# Patient Record
Sex: Female | Born: 1955 | Race: White | Hispanic: No | Marital: Single | Smoking: Former smoker
Health system: Southern US, Community
[De-identification: ages and names within clinical notes are randomized; demographics above are authoritative.]

## PROBLEM LIST (undated history)

## (undated) DIAGNOSIS — A419 Sepsis, unspecified organism: Secondary | ICD-10-CM

## (undated) DIAGNOSIS — M109 Gout, unspecified: Secondary | ICD-10-CM

## (undated) DIAGNOSIS — J449 Chronic obstructive pulmonary disease, unspecified: Secondary | ICD-10-CM

## (undated) DIAGNOSIS — E7800 Pure hypercholesterolemia, unspecified: Secondary | ICD-10-CM

## (undated) HISTORY — PX: ABDOMINAL HYSTERECTOMY: SHX81

## (undated) HISTORY — PX: ROTATOR CUFF REPAIR: SHX139

---

## 1997-12-12 ENCOUNTER — Ambulatory Visit (HOSPITAL_COMMUNITY): Admission: RE | Admit: 1997-12-12 | Discharge: 1997-12-12 | Payer: Self-pay | Admitting: Cardiology

## 1998-11-06 ENCOUNTER — Encounter: Payer: Self-pay | Admitting: Cardiology

## 1998-11-06 ENCOUNTER — Ambulatory Visit (HOSPITAL_COMMUNITY): Admission: RE | Admit: 1998-11-06 | Discharge: 1998-11-06 | Payer: Self-pay | Admitting: Cardiology

## 1999-12-15 ENCOUNTER — Encounter: Payer: Self-pay | Admitting: Cardiology

## 1999-12-15 ENCOUNTER — Ambulatory Visit (HOSPITAL_COMMUNITY): Admission: RE | Admit: 1999-12-15 | Discharge: 1999-12-15 | Payer: Self-pay | Admitting: Cardiology

## 2000-02-24 ENCOUNTER — Ambulatory Visit (HOSPITAL_BASED_OUTPATIENT_CLINIC_OR_DEPARTMENT_OTHER): Admission: RE | Admit: 2000-02-24 | Discharge: 2000-02-24 | Payer: Self-pay | Admitting: Orthopedic Surgery

## 2000-05-26 ENCOUNTER — Other Ambulatory Visit: Admission: RE | Admit: 2000-05-26 | Discharge: 2000-05-26 | Payer: Self-pay | Admitting: Gynecology

## 2001-01-19 ENCOUNTER — Encounter: Payer: Self-pay | Admitting: Internal Medicine

## 2001-01-19 ENCOUNTER — Ambulatory Visit (HOSPITAL_COMMUNITY): Admission: RE | Admit: 2001-01-19 | Discharge: 2001-01-19 | Payer: Self-pay | Admitting: Internal Medicine

## 2001-05-05 ENCOUNTER — Other Ambulatory Visit: Admission: RE | Admit: 2001-05-05 | Discharge: 2001-05-05 | Payer: Self-pay | Admitting: Gynecology

## 2003-10-14 ENCOUNTER — Ambulatory Visit (HOSPITAL_COMMUNITY): Admission: RE | Admit: 2003-10-14 | Discharge: 2003-10-14 | Payer: Self-pay | Admitting: Internal Medicine

## 2003-10-14 ENCOUNTER — Encounter (INDEPENDENT_AMBULATORY_CARE_PROVIDER_SITE_OTHER): Payer: Self-pay | Admitting: Specialist

## 2003-10-27 ENCOUNTER — Encounter (INDEPENDENT_AMBULATORY_CARE_PROVIDER_SITE_OTHER): Payer: Self-pay | Admitting: *Deleted

## 2003-10-27 ENCOUNTER — Inpatient Hospital Stay (HOSPITAL_COMMUNITY): Admission: EM | Admit: 2003-10-27 | Discharge: 2003-10-28 | Payer: Self-pay | Admitting: Emergency Medicine

## 2003-12-28 ENCOUNTER — Inpatient Hospital Stay (HOSPITAL_COMMUNITY): Admission: EM | Admit: 2003-12-28 | Discharge: 2003-12-30 | Payer: Self-pay | Admitting: Emergency Medicine

## 2006-01-30 ENCOUNTER — Emergency Department (HOSPITAL_COMMUNITY): Admission: EM | Admit: 2006-01-30 | Discharge: 2006-01-30 | Payer: Self-pay | Admitting: Emergency Medicine

## 2006-02-01 ENCOUNTER — Emergency Department (HOSPITAL_COMMUNITY): Admission: EM | Admit: 2006-02-01 | Discharge: 2006-02-01 | Payer: Self-pay | Admitting: Emergency Medicine

## 2006-06-29 ENCOUNTER — Ambulatory Visit (HOSPITAL_COMMUNITY): Admission: RE | Admit: 2006-06-29 | Discharge: 2006-06-29 | Payer: Self-pay | Admitting: Internal Medicine

## 2006-07-14 ENCOUNTER — Inpatient Hospital Stay (HOSPITAL_COMMUNITY): Admission: EM | Admit: 2006-07-14 | Discharge: 2006-07-15 | Payer: Self-pay | Admitting: Emergency Medicine

## 2006-07-15 ENCOUNTER — Encounter (INDEPENDENT_AMBULATORY_CARE_PROVIDER_SITE_OTHER): Payer: Self-pay | Admitting: *Deleted

## 2006-07-15 ENCOUNTER — Ambulatory Visit (HOSPITAL_COMMUNITY): Admission: RE | Admit: 2006-07-15 | Discharge: 2006-07-15 | Payer: Self-pay | Admitting: *Deleted

## 2006-08-16 ENCOUNTER — Ambulatory Visit (HOSPITAL_COMMUNITY): Admission: RE | Admit: 2006-08-16 | Discharge: 2006-08-16 | Payer: Self-pay | Admitting: *Deleted

## 2006-10-17 ENCOUNTER — Ambulatory Visit (HOSPITAL_COMMUNITY): Admission: RE | Admit: 2006-10-17 | Discharge: 2006-10-17 | Payer: Self-pay | Admitting: Internal Medicine

## 2006-12-03 ENCOUNTER — Inpatient Hospital Stay (HOSPITAL_COMMUNITY): Admission: EM | Admit: 2006-12-03 | Discharge: 2006-12-05 | Payer: Self-pay | Admitting: Emergency Medicine

## 2006-12-05 ENCOUNTER — Encounter (INDEPENDENT_AMBULATORY_CARE_PROVIDER_SITE_OTHER): Payer: Self-pay | Admitting: Specialist

## 2006-12-08 ENCOUNTER — Ambulatory Visit: Payer: Self-pay | Admitting: Internal Medicine

## 2006-12-20 ENCOUNTER — Ambulatory Visit: Payer: Self-pay | Admitting: Internal Medicine

## 2007-01-30 ENCOUNTER — Emergency Department (HOSPITAL_COMMUNITY): Admission: EM | Admit: 2007-01-30 | Discharge: 2007-01-30 | Payer: Self-pay | Admitting: Emergency Medicine

## 2007-03-15 ENCOUNTER — Emergency Department (HOSPITAL_COMMUNITY): Admission: EM | Admit: 2007-03-15 | Discharge: 2007-03-15 | Payer: Self-pay | Admitting: Emergency Medicine

## 2007-05-16 ENCOUNTER — Emergency Department (HOSPITAL_COMMUNITY): Admission: EM | Admit: 2007-05-16 | Discharge: 2007-05-16 | Payer: Self-pay | Admitting: Emergency Medicine

## 2007-05-31 ENCOUNTER — Encounter: Payer: Self-pay | Admitting: Pulmonary Disease

## 2007-05-31 ENCOUNTER — Ambulatory Visit (HOSPITAL_COMMUNITY): Admission: RE | Admit: 2007-05-31 | Discharge: 2007-05-31 | Payer: Self-pay | Admitting: Internal Medicine

## 2007-07-01 ENCOUNTER — Emergency Department (HOSPITAL_COMMUNITY): Admission: EM | Admit: 2007-07-01 | Discharge: 2007-07-01 | Payer: Self-pay | Admitting: Emergency Medicine

## 2007-07-01 ENCOUNTER — Encounter: Payer: Self-pay | Admitting: Pulmonary Disease

## 2007-07-11 ENCOUNTER — Ambulatory Visit: Payer: Self-pay | Admitting: Pulmonary Disease

## 2007-07-11 DIAGNOSIS — R05 Cough: Secondary | ICD-10-CM

## 2007-07-11 DIAGNOSIS — J45909 Unspecified asthma, uncomplicated: Secondary | ICD-10-CM

## 2007-07-11 DIAGNOSIS — K219 Gastro-esophageal reflux disease without esophagitis: Secondary | ICD-10-CM | POA: Insufficient documentation

## 2007-07-11 DIAGNOSIS — R059 Cough, unspecified: Secondary | ICD-10-CM | POA: Insufficient documentation

## 2007-07-11 DIAGNOSIS — G4730 Sleep apnea, unspecified: Secondary | ICD-10-CM | POA: Insufficient documentation

## 2007-07-11 DIAGNOSIS — R0602 Shortness of breath: Secondary | ICD-10-CM | POA: Insufficient documentation

## 2007-07-12 ENCOUNTER — Telehealth (INDEPENDENT_AMBULATORY_CARE_PROVIDER_SITE_OTHER): Payer: Self-pay | Admitting: *Deleted

## 2007-07-13 DIAGNOSIS — I10 Essential (primary) hypertension: Secondary | ICD-10-CM

## 2007-07-14 ENCOUNTER — Telehealth: Payer: Self-pay | Admitting: Pulmonary Disease

## 2007-07-17 ENCOUNTER — Telehealth: Payer: Self-pay | Admitting: Pulmonary Disease

## 2007-08-01 ENCOUNTER — Ambulatory Visit: Payer: Self-pay | Admitting: Pulmonary Disease

## 2007-08-04 ENCOUNTER — Telehealth (INDEPENDENT_AMBULATORY_CARE_PROVIDER_SITE_OTHER): Payer: Self-pay | Admitting: *Deleted

## 2007-08-07 ENCOUNTER — Telehealth (INDEPENDENT_AMBULATORY_CARE_PROVIDER_SITE_OTHER): Payer: Self-pay | Admitting: *Deleted

## 2007-08-08 ENCOUNTER — Encounter: Payer: Self-pay | Admitting: Internal Medicine

## 2007-12-03 ENCOUNTER — Observation Stay (HOSPITAL_COMMUNITY): Admission: EM | Admit: 2007-12-03 | Discharge: 2007-12-05 | Payer: Self-pay | Admitting: Emergency Medicine

## 2008-07-12 ENCOUNTER — Ambulatory Visit (HOSPITAL_COMMUNITY): Admission: RE | Admit: 2008-07-12 | Discharge: 2008-07-12 | Payer: Self-pay | Admitting: Internal Medicine

## 2008-07-30 ENCOUNTER — Inpatient Hospital Stay (HOSPITAL_COMMUNITY): Admission: AD | Admit: 2008-07-30 | Discharge: 2008-08-01 | Payer: Self-pay | Admitting: Internal Medicine

## 2008-10-11 ENCOUNTER — Telehealth (INDEPENDENT_AMBULATORY_CARE_PROVIDER_SITE_OTHER): Payer: Self-pay | Admitting: *Deleted

## 2008-10-21 ENCOUNTER — Ambulatory Visit: Payer: Self-pay | Admitting: Pulmonary Disease

## 2008-10-21 ENCOUNTER — Ambulatory Visit (HOSPITAL_COMMUNITY): Payer: Self-pay | Admitting: Psychiatry

## 2008-10-21 DIAGNOSIS — J209 Acute bronchitis, unspecified: Secondary | ICD-10-CM

## 2009-02-16 ENCOUNTER — Emergency Department (HOSPITAL_COMMUNITY): Admission: EM | Admit: 2009-02-16 | Discharge: 2009-02-16 | Payer: Self-pay | Admitting: Emergency Medicine

## 2009-02-26 ENCOUNTER — Inpatient Hospital Stay (HOSPITAL_COMMUNITY): Admission: EM | Admit: 2009-02-26 | Discharge: 2009-02-28 | Payer: Self-pay | Admitting: Emergency Medicine

## 2009-02-27 ENCOUNTER — Ambulatory Visit: Payer: Self-pay | Admitting: Internal Medicine

## 2009-02-28 ENCOUNTER — Encounter: Payer: Self-pay | Admitting: Internal Medicine

## 2009-03-10 ENCOUNTER — Emergency Department (HOSPITAL_COMMUNITY): Admission: EM | Admit: 2009-03-10 | Discharge: 2009-03-10 | Payer: Self-pay | Admitting: Emergency Medicine

## 2009-03-14 ENCOUNTER — Emergency Department (HOSPITAL_COMMUNITY): Admission: EM | Admit: 2009-03-14 | Discharge: 2009-03-14 | Payer: Self-pay | Admitting: Emergency Medicine

## 2010-11-07 LAB — CBC
HCT: 50.5 % — ABNORMAL HIGH (ref 36.0–46.0)
Hemoglobin: 16.8 g/dL — ABNORMAL HIGH (ref 12.0–15.0)
Hemoglobin: 17.5 g/dL — ABNORMAL HIGH (ref 12.0–15.0)
MCHC: 33.3 g/dL (ref 30.0–36.0)
RBC: 4.91 MIL/uL (ref 3.87–5.11)
RBC: 5.15 MIL/uL — ABNORMAL HIGH (ref 3.87–5.11)
RDW: 13.1 % (ref 11.5–15.5)

## 2010-11-07 LAB — LIPASE, BLOOD: Lipase: 13 U/L (ref 11–59)

## 2010-11-07 LAB — DIFFERENTIAL
Basophils Absolute: 0 10*3/uL (ref 0.0–0.1)
Basophils Absolute: 0.1 10*3/uL (ref 0.0–0.1)
Basophils Relative: 1 % (ref 0–1)
Eosinophils Absolute: 0.3 10*3/uL (ref 0.0–0.7)
Lymphocytes Relative: 22 % (ref 12–46)
Monocytes Absolute: 0.7 10*3/uL (ref 0.1–1.0)
Monocytes Relative: 2 % — ABNORMAL LOW (ref 3–12)
Monocytes Relative: 4 % (ref 3–12)
Neutro Abs: 12 10*3/uL — ABNORMAL HIGH (ref 1.7–7.7)
Neutro Abs: 16.7 10*3/uL — ABNORMAL HIGH (ref 1.7–7.7)
Neutrophils Relative %: 77 % (ref 43–77)

## 2010-11-07 LAB — URINALYSIS, ROUTINE W REFLEX MICROSCOPIC
Glucose, UA: NEGATIVE mg/dL
Ketones, ur: NEGATIVE mg/dL
Nitrite: NEGATIVE
Protein, ur: NEGATIVE mg/dL
Urobilinogen, UA: 0.2 mg/dL (ref 0.0–1.0)
Urobilinogen, UA: 0.2 mg/dL (ref 0.0–1.0)
pH: 6 (ref 5.0–8.0)

## 2010-11-07 LAB — COMPREHENSIVE METABOLIC PANEL
Alkaline Phosphatase: 108 U/L (ref 39–117)
BUN: 5 mg/dL — ABNORMAL LOW (ref 6–23)
CO2: 26 mEq/L (ref 19–32)
GFR calc non Af Amer: >60 mL/min (ref >60)
Glucose, Bld: 99 mg/dL (ref 70–99)
Potassium: 3.3 mEq/L — ABNORMAL LOW (ref 3.5–5.1)
Total Bilirubin: 0.7 mg/dL (ref 0.3–1.2)
Total Protein: 6.9 g/dL (ref 6.0–8.3)

## 2010-11-07 LAB — BASIC METABOLIC PANEL
Calcium: 9.9 mg/dL (ref 8.4–10.5)
Creatinine, Ser: 0.86 mg/dL (ref 0.4–1.2)

## 2010-11-08 LAB — DIFFERENTIAL
Basophils Absolute: 0 10*3/uL (ref 0.0–0.1)
Basophils Absolute: 0.2 10*3/uL — ABNORMAL HIGH (ref 0.0–0.1)
Basophils Relative: 0 % (ref 0–1)
Basophils Relative: 1 % (ref 0–1)
Basophils Relative: 2 % — ABNORMAL HIGH (ref 0–1)
Eosinophils Absolute: 0.2 10*3/uL (ref 0.0–0.7)
Eosinophils Relative: 2 % (ref 0–5)
Eosinophils Relative: 2 % (ref 0–5)
Lymphocytes Relative: 23 % (ref 12–46)
Lymphocytes Relative: 25 % (ref 12–46)
Lymphs Abs: 3.6 10*3/uL (ref 0.7–4.0)
Lymphs Abs: 3.7 10*3/uL (ref 0.7–4.0)
Monocytes Absolute: 0.5 10*3/uL (ref 0.1–1.0)
Monocytes Absolute: 0.6 10*3/uL (ref 0.1–1.0)
Monocytes Relative: 4 % (ref 3–12)
Monocytes Relative: 5 % (ref 3–12)
Monocytes Relative: 5 % (ref 3–12)
Neutro Abs: 10.2 10*3/uL — ABNORMAL HIGH (ref 1.7–7.7)
Neutro Abs: 4.5 10*3/uL (ref 1.7–7.7)
Neutro Abs: 8.9 10*3/uL — ABNORMAL HIGH (ref 1.7–7.7)
Neutrophils Relative %: 48 % (ref 43–77)
Neutrophils Relative %: 61 % (ref 43–77)
Neutrophils Relative %: 70 % (ref 43–77)

## 2010-11-08 LAB — CBC
HCT: 44.7 % (ref 36.0–46.0)
HCT: 50.7 % — ABNORMAL HIGH (ref 36.0–46.0)
Hemoglobin: 15.4 g/dL — ABNORMAL HIGH (ref 12.0–15.0)
Hemoglobin: 17.1 g/dL — ABNORMAL HIGH (ref 12.0–15.0)
MCHC: 33.3 g/dL (ref 30.0–36.0)
MCHC: 33.8 g/dL (ref 30.0–36.0)
MCV: 104.2 fL — ABNORMAL HIGH (ref 78.0–100.0)
Platelets: 216 10*3/uL (ref 150–400)
Platelets: 218 10*3/uL (ref 150–400)
Platelets: 231 10*3/uL (ref 150–400)
RBC: 4.28 MIL/uL (ref 3.87–5.11)
RDW: 13 % (ref 11.5–15.5)
RDW: 13.3 % (ref 11.5–15.5)
RDW: 13.3 % (ref 11.5–15.5)
WBC: 11.5 10*3/uL — ABNORMAL HIGH (ref 4.0–10.5)
WBC: 13.1 10*3/uL — ABNORMAL HIGH (ref 4.0–10.5)

## 2010-11-08 LAB — COMPREHENSIVE METABOLIC PANEL
ALT: 26 U/L (ref 0–35)
AST: 21 U/L (ref 0–37)
AST: 22 U/L (ref 0–37)
Albumin: 3.3 g/dL — ABNORMAL LOW (ref 3.5–5.2)
Albumin: 3.4 g/dL — ABNORMAL LOW (ref 3.5–5.2)
Albumin: 3.8 g/dL (ref 3.5–5.2)
Alkaline Phosphatase: 101 U/L (ref 39–117)
Alkaline Phosphatase: 103 U/L (ref 39–117)
Alkaline Phosphatase: 104 U/L (ref 39–117)
BUN: 2 mg/dL — ABNORMAL LOW (ref 6–23)
BUN: 3 mg/dL — ABNORMAL LOW (ref 6–23)
BUN: 5 mg/dL — ABNORMAL LOW (ref 6–23)
Calcium: 9.5 mg/dL (ref 8.4–10.5)
Chloride: 106 mEq/L (ref 96–112)
Chloride: 110 mEq/L (ref 96–112)
Creatinine, Ser: 0.74 mg/dL (ref 0.4–1.2)
GFR calc Af Amer: >60 mL/min (ref >60)
GFR calc Af Amer: >60 mL/min (ref >60)
Glucose, Bld: 100 mg/dL — ABNORMAL HIGH (ref 70–99)
Glucose, Bld: 91 mg/dL (ref 70–99)
Potassium: 3 mEq/L — ABNORMAL LOW (ref 3.5–5.1)
Potassium: 3.1 mEq/L — ABNORMAL LOW (ref 3.5–5.1)
Sodium: 140 mEq/L (ref 135–145)
Total Bilirubin: 0.6 mg/dL (ref 0.3–1.2)
Total Bilirubin: 0.6 mg/dL (ref 0.3–1.2)
Total Protein: 6.3 g/dL (ref 6.0–8.3)
Total Protein: 6.9 g/dL (ref 6.0–8.3)
Total Protein: 7.3 g/dL (ref 6.0–8.3)

## 2010-11-08 LAB — POCT CARDIAC MARKERS
CKMB, poc: <1 ng/mL — ABNORMAL LOW (ref 1.0–8.0)
Myoglobin, poc: 38.1 ng/mL (ref 12–200)

## 2010-11-08 LAB — URINALYSIS, ROUTINE W REFLEX MICROSCOPIC
Bilirubin Urine: NEGATIVE
Glucose, UA: NEGATIVE mg/dL
Hgb urine dipstick: NEGATIVE
Ketones, ur: NEGATIVE mg/dL
Nitrite: NEGATIVE
Protein, ur: NEGATIVE mg/dL
Specific Gravity, Urine: 1.002 — ABNORMAL LOW (ref 1.005–1.030)
Urobilinogen, UA: 0.2 mg/dL (ref 0.0–1.0)
pH: 6 (ref 5.0–8.0)
pH: 6 (ref 5.0–8.0)

## 2010-11-08 LAB — PROTIME-INR: INR: 1 (ref 0.00–1.49)

## 2010-11-08 LAB — TSH: TSH: 0.059 u[IU]/mL — ABNORMAL LOW (ref 0.350–4.500)

## 2010-11-08 LAB — APTT: aPTT: 29 seconds (ref 24–37)

## 2010-11-08 LAB — POTASSIUM: Potassium: 3.4 mEq/L — ABNORMAL LOW (ref 3.5–5.1)

## 2010-11-08 LAB — VITAMIN D 25 HYDROXY (VIT D DEFICIENCY, FRACTURES): Vit D, 25-Hydroxy: 30 ng/mL (ref 30–89)

## 2010-12-15 NOTE — Discharge Summary (Signed)
NAMEPANSIE, GUGGISBERG                 ACCOUNT NO.:  [id number]   MEDICAL RECORD NO.:  [id number]          PATIENT TYPE:  INP   LOCATION:  1524                         FACILITY:  [facility]   PHYSICIAN:  Mark A. Perini, M.D.   DATE OF BIRTH:  [birth year]   DATE OF ADMISSION:  02/26/2009  DATE OF DISCHARGE:  02/28/2009                               DISCHARGE SUMMARY   DISCHARGE DIAGNOSES:  1. Right upper quadrant and right flank pain, the cause is unclear.  2. Fever and leukocytosis which responded to Zosyn therapy.  Again      this can not be explained although the patient may have had a form      of infectious colitis with some possible thickening of her      ascending colon noted on a CT scan upon admission.  3. Negative esophagogastroduodenoscopy this admission.  4. Chronic anxiety and depression.  5. Asthma.  6. History of coronary spasm.  7. Chronic obstructive pulmonary disease.  8. History of gastroesophageal reflux.  9. Hypothyroidism requiring adjustment of medication this admission.  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   PROCEDURES:  1. Gastroenterology consultation, esophagogastroduodenoscopy which was      normal.  2. Abdominal ultrasound on February 27, 2009 showing slight dilatation of      intrahepatic bile ducts but the size of the common bile duct and      common hepatic duct were normal and previous cholecystectomy was      noted.  3. Plain x-rays of the thoracic and lumbar spine showed some      degenerative changes but no acute findings.  4. CT scan of the abdomen and pelvis with IV contrast on February 26, 2009      showed an apparent thickening of the proximal colon which likely      reflected incomplete distention.  There were no secondary signs of      colitis.  Clinical correlation advised status post cholecystectomy.      No acute pelvic CT findings.   DISCHARGE MEDICATIONS:  1. Aspirin 81 mg daily.  2. Centrum multivitamin daily.  3. Elavil 100 mg each evening.  4.  Levothyroxine is reduced to 112 mcg one pill daily.  5. Symbicort 160/4.5 two puffs twice daily.  6. Xanax three times a day as before.  7. Wellbutrin XL 150 mg one pill each day.  8. B12 supplement daily.  9. One thousand units of over-the-counter vitamin D daily long-term.  10.Phenergan 12.5 mg every 8 hours as needed for nausea.  11.Vicodin 5/500 one to two every 6 hours as needed for pain.  12.Gabapentin 100 mg three times a day as needed.  13.Augmentin 875 mg twice a day with food for 5 days.  14.Diflucan to be dosed at the end of her Augmentin course.  15.She is to take over-the-counter Align one daily for 2 weeks.   HISTORY OF PRESENT ILLNESS:  Karie is a 55 year old female who presented  with a second trip to the emergency room for similar complaints which  included right sided abdominal pain and flank pain and fever.  She  initially was felt to have a virus but her symptoms did not improve and  she presented again to the emergency room and CT scan showed equivocal  findings of possible thickening of the ascending colon.  She did have  fever and a high white blood count.  She was admitted for further care.   HOSPITAL COURSE:  Lyndee was admitted to a regular bed.  She remained  stable from a hemodynamic standpoint during her stay and she remained  stable from a pulmonary standpoint during her stay.  She was treated  empirically with Zosyn with improvement in temperature curve and white  blood count, however she continued to report right upper quadrant and  right abdominal pain and it almost had a neuropathic component to it.  Therefore, plain x-rays of the thoracic and lumbar spine were performed  with the results as noted above.  Abdominal ultrasound was performed  with the results as noted above.  She did seem to respond somewhat to  gabapentin although there was no true evidence of any kind of shingles  rash noted.  Her thyroid medicine was adjusted and Gastroenterology was   consulted.  By February 28, 2009 her vital signs were stable.  Her labs had  improved and her endoscopy was negative.  Therefore she was discharged  home to complete a course of oral antibiotics and she was sent home with  pain medication as well.   DISCHARGE PHYSICAL EXAM:  Temperature 97.9, afebrile, pulse 72, blood  pressure 107/72, respiratory rate 16, 94% saturation on room air.  She  is in no acute distress.  She was alert and oriented.  LUNGS:  Clear to auscultation bilaterally with no wheezes, rales or  rhonchi.  HEART:  Regular rate and rhythm with no murmur, rub or gallop.  ABDOMEN:  Soft, nontender, nondistended.  She reported some tenderness  but the abdomen was soft with no rebound or guarding.  There was no  edema.  There was no rash.   NOTABLE DISCHARGE LABORATORY DATA:  A 25-hydroxy vitamin D level was 30,  sodium 140, potassium 3.8, chloride 104, CO2 - 27, BUN 2, creatinine  0.81, glucose 100, LFTs normal with the exception of a slightly low  albumin of 3.4, white count 9.4, hemoglobin 15.4, MCV elevated at 105,  platelet count 218,000, there were 48% segs, 39% lymphocytes, 5%  monocytes and 7% eosinophils.  TSH was suppressed at 0.059 on February 26, 2009, lipase was normal at 15.  Urine pregnancy test was negative and  urinalysis was negative upon admission.   DISCHARGE INSTRUCTIONS:  Shannon is to follow a low-salt, low-fat diet.  She is to call if she has any recurrent problems.  She is to follow her  medications as noted above.  She is to call for a followup visit in one  week.      Mark A. Perini, M.D.  Electronically Signed     MAP/MEDQ  D:  03/07/2009  T:  03/07/2009  Job:  [id number]

## 2010-12-15 NOTE — H&P (Signed)
NAMEVARNIKA, [name] NO.:  [id number]   MEDICAL RECORD NO.:  [id number]          PATIENT TYPE:  EMS   LOCATION:  ED                           FACILITY:  [facility]   PHYSICIAN:  Mark A. Perini, M.D.   DATE OF BIRTH:  1956/07/07   DATE OF ADMISSION:  02/26/2009  DATE OF DISCHARGE:                              HISTORY & PHYSICAL   CHIEF COMPLAINT:  Abdominal pain.   HISTORY PRESENT ILLNESS:  [name] is a 55 year old Caucasian female well-  known to me.  Her past medical history is as listed below.  She has  pending disability.  She has chronic depression and has remained  relatively depressed the last several months.  She had an admission for  delusional disorder in late December, 2009 and she did take Seroquel for  some time but this has been stopped for the last 3-4 months.  She states  that she is not having any  delusional thoughts currently.  She  presented to the emergency room about 10 days ago with complaint of  abdominal pain.  She was felt to possibly have a virus.  However, her  symptoms did not improve and she presented again to the emergency room  today with right-sided abdominal pain and tenderness.  In the emergency  room, she is noted to have a high white count.  CT scan is somewhat  equivocal showing some possible thickening of the ascending colon wall  but this may just be a normal finding.  There is no other source of  infection seen.  However, she has had significant nausea and vomiting  and has been unable to keep down p.o. intake well.  She had also had  some subjective temperatures and cold feeling and chills and her  temperature was 100 today with the paramedics.  Given all these  findings, she will be admitted for further care.   PAST MEDICAL HISTORY:  1. Hypothyroidism.  2. Hysterectomy without oophorectomy, 1985.  3. Tonsillectomy, adenoidectomy.  4. D and C in 1984.  5. Chronic depression, anxiety.  6. G0 parity status.  7. Right rotator  cuff surgery, 2001.  8. Cholecystectomy April 2005.  9. Colon polyps noted, 2005.  10.She was hospitalized, 2005, for acute asthmatic bronchitis.  11.She has chronic asthma as well.  12.She had left forehead shingles in 2007 and has had post herpetic      pain.  13.She does have a history of somewhat of a chronic elevated white      count since 2005.  Last white blood count in our office was 10.6 in      December 2009.  14.She has a history of hyperlipidemia.  15.Gastroesophageal reflux.  16.Dependent personality disorder.  Again she was admitted in December      2009 for delusional symptoms.  17.She had a negative cardiac workup late in 2007.  However, she might      have had some evidence of coronary spasm seen on a heart cath and      she had been treated with Diovan and nitroglycerin agents for some  time.   ALLERGIES:  GEMFIBROZIL CAUSES SICKNESS ON THE STOMACH.  TYLOX  DOES NOT  WORK.  BACTRIM CAUSES NAUSEA, VOMITING.  NITROGLYCERINE PATCH CAUSED A  LOCAL REACTION.  GRAPEFRUIT JUICE GAVE HER A BREATHING PROBLEM  AND  PRAVACHOL MADE HER DIZZY AND ANGRY.   CURRENT MEDICATIONS:  1. Aspirin 81 mg daily.  2. Centrum 1 daily.  3. Elavil 100 mg each evening.  4. Symbicort 2 puffs twice daily 160/4.5 dose.  5. Levothyroxine 150 mcg daily except none 1 day a week.  6. Vicodin rarely.  7. Vitamin B12 over-the-counter 1 daily.  8. Xanax 0.5 mg 3x a day.  9. Nexium 40 mg 2x a day.  10.DuoNeb 3x a day.  She had been off isosorbide and Diovan and she has been off Seroquel for  the last 3-4 months.   SOCIAL HISTORY:  She is divorced in 84.  She has a boyfriend named  [name] who is with her now.  She has no children.  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  She smokes.  She has had a 30 pack-year  history of smoking but she had quit in 2006 and has mostly maintainED  her abstinence from smoking.   No alcohol, no drug use.  Father died at  age 28 of a motor vehicle accident.  Mother died at age 56 of brain  tumors.  She is an only child.   REVIEW OF SYSTEMS:  As per the history of present illness.  She denies  any blood from above or below.  She has been moving her bowels.  She has  been passing urine relatively well.   PHYSICAL EXAM:  Temperature 99.1, blood pressure 131/78, pulse 103,  respiratory 18, 94% saturation on room air.  She is alert and oriented.  She is in no acute distress.  There is no pallor, no icterus, no JVD.  LUNGS:  Reveals some scant wheezing bilaterally but overall, fairly good  air movement bilaterally.  No rhonchi or rales.  HEART:  Regular rate and rhythm with no murmur, rub or gallop.  ABDOMEN:  Reveals decreased bowel sounds.  The abdomen is soft with no  rebound or guarding.  She reports some tenderness in the left side of  the abdomen, in left upper quadrant and left side, and somewhat in the  right upper quadrant as well.  There is no peripheral edema.  She moves  extremities x4 and responds to questions appropriately and follows  commands well.   LABORATORY DATA:  Lipase is normal at 15.  Sodium is 141, potassium was  low at 3.0, chloride is 107, CO2 27, BUN 3, creatinine 0.74, glucose 91.  GFR greater than 60, total bili 0.4, alk phos 103, AST 22, ALT 16, total  protein 6.9, albumin 3.7, calcium 9.5.  White count 14.6, hemoglobin  17.1, platelet count 243,000.  There are 70% segs, 23% lymphocytes, 4%  monocytes, 2% eosinophils, 2% basophils.  Urinalysis is negative.  Urine  HCG test is negative.   CT scan of the abdomen and pelvis performed with IV contrast:  Shows  apparent thickening of the proximal colon which could reflect incomplete  distention.  There are no secondary signs of colitis.  Clinical  correlation is advised. She is status post cholecystectomy.  There are  no acute findings in the pelvis or the remainder of the abdomen.  ASSESSMENT/PLAN:  Abdominal pain, low grade temperatures and equivocal  findings on CT scan.  [name] may have a form of colitis.  She may have  some other GI process such as peptic ulcer disease.  We will admit her.  We will give her IV fluids.  We will replace her potassium.  We will  treat with antiemetics.  I will cover her with  intravenous Zosyn.  We will get an abdominal ultrasound and a  gastroenterology consultation.  Hopefully her symptoms will improve with  these measures.  We will continue her other home medications and check  her TSH as well.      Mark A. Perini, M.D.  Electronically Signed     MAP/MEDQ  D:  02/26/2009  T:  02/26/2009  Job:  [id number]

## 2010-12-15 NOTE — Discharge Summary (Signed)
[name], MALLIS NO.:  [id number]   MEDICAL RECORD NO.:  [id number]          PATIENT TYPE:  INP   LOCATION:  2021                         FACILITY:  [facility]   PHYSICIAN:  Mark A. Perini, M.D.   DATE OF BIRTH:  [birth year]   DATE OF ADMISSION:  12/03/2006  DATE OF DISCHARGE:  12/05/2006                               DISCHARGE SUMMARY   .   DISCHARGE DIAGNOSES:  1. Anxiety depression.  2. Dependent personality disorder.  3. Chronic asthma.  4. Chronic obstructive pulmonary disease.  5. Hypothyroidism.  6. Atypical chest pain.  7. Dyspnea and dyspnea on exertion.  8. Hypothyroidism.  9. Presumed gastroesophageal reflux.  10.B12 deficiency.  11.Possible coronary spasm which had been treated medically.  12.Normal endoscopy this admission.   PROCEDURES:  Upper endoscopy and gastroenterology consultation.   DISCHARGE MEDICATIONS:  1. Diovan 80 mg daily.  2. Isosorbide 30 mg daily.  3. Xanax 0.5 mg q.i.d.  4. 81 mg aspirin daily.  5. Wellbutrin XL 150 mg daily.  6. Levothyroxine 200 mg daily except 1/2 pill each Sunday and      Wednesday.  7. Advair 250/50 1 puff twice a day.  8. DuoNeb breathing treatments three times a day.  9. Elavil 100 mg at bedtime.  10.Gas-X as needed.  11.Phenergan as needed.  12.Nexium 40 mg daily.  13.Vicodin 1-2 tablets day as needed.  14.Lyrica 50 mg b.i.d. to t.i.d. as needed for postherpetic neuralgia      symptoms of the face.   HISTORY OF PRESENT ILLNESS:  [name] is a 55 year old female who calls our  office two to three times every single day with a history of depression,  dependent personality, chronic asthma, COPD, hypothyroidism and apical  chest pain.  She had a cardiac catheterization in January revealing some  catheter induced vasospasm of the proximal RCA but otherwise normal  coronaries and a normal ejection fraction and borderline pulmonary  hypertension.  She was placed on Diovan and nitrates.  She  presented  with several week history of increased epigastric bloating, pain, nausea  and vomiting despite one to three Nexium each day.  She was scheduled to  see Dr. Marina Goodell as an outpatient, however, on the day of admission she  developed 10/10 chest pain 10 minutes after eating.  She took three  sublingual nitroglycerin with no relief.  EMS was called and she was  brought to the emergency room.  In the emergency room it was felt that  this was not a cardiac etiology.  However, she was admitted for further  care.   HOSPITAL COURSE:  [name] was admitted.  There was no evidence of acute  coronary syndrome based on EKG and cardiac enzymes.  GI was consulted  and she did undergo upper endoscopy which was essentially normal.  On  Dec 05, 2006 she was deemed stable to be discharged home.   DISCHARGE PHYSICAL EXAM:  Temperature 98.9, afebrile, pulse 80-120,  normal sinus rhythm, blood pressure 106/69, 95% saturation on room air.  She is in no acute distress.  LUNGS:  Clear  to auscultation bilaterally with no wheezes, rales or  rhonchi.  HEART:  Regular rate and rhythm with no murmur, rub or gallop.  ABDOMEN:  Obese, soft, nontender.  There was no edema.   NOTABLE LABORATORY DATA:  On 12/04/2006 white count was 9.6, hemoglobin  13.5, platelet count 230,000.  Coagulation studies were normal.  On  12/04/2006 sodium was 133, potassium 3.8, chloride 98, CO2 25, glucose  88, BUN 11, creatinine 0.89. CK enzyme was normal and troponin-I was  normal x2 sets, actually x3 sets.   DISCHARGE INSTRUCTIONS:  [name] is to follow a low-salt diet and is to  watch foods that cause reflux.  She is to increase her activity as  tolerated.  She is to follow up with Dr. Waynard Edwards at a previously  scheduled visit.  She is to call if there are any recurrent problems.           ______________________________  Redge Gainer Waynard Edwards, M.D.     MAP/MEDQ  D:  01/19/2007  T:  01/19/2007  Job:  [id number]

## 2010-12-15 NOTE — Assessment & Plan Note (Signed)
HEALTHCARE                         GASTROENTEROLOGY OFFICE NOTE   [name], [name]                        MRN:          [id number]  DATE:12/20/2006                            DOB:          [birth year]    REASON FOR CONSULTATION:  Multiple chronic complaints led by chest pain.  Exclude GI etiology.   HISTORY:  This is a 55 year old white female with a history of anxiety  and depressive disorder, chronic asthma, COPD from prior tobacco abuse,  hypothyroidism, history of atypical chest pain, obesity, and  gastroesophageal reflux disease.  The patient was evaluated in this  office initially in February of 2005 for rectal bleeding.  She was felt  to have irritable bowel syndrome.  She underwent complete colonoscopy,  October 15, 2003.  Examination was normal except for small internal  hemorrhoids.  A diminutive colon polyp was removed and found to be  hyperplastic.  Followup in 10 years recommended.  More recently, the  patient reports a 72-month history of problems with chest pain.  Also,  problems with epigastric fullness, sensation of shortness of breath,  nausea, difficulty eating, and occasional vomiting.  For these symptoms,  she was hospitalized Dec 03, 2006.  She underwent a formal cardiac  evaluation including cardiac consultation, CT scan of the chest, and  gastroenterology consultation which included upper endoscopy and  abdominal ultrasound.  Despite these evaluations and investigations, no  etiology for her symptoms was elucidated.  Despite b.i.d. proton pump  inhibitor therapy, she denies significant change in symptoms. Upon  questioning, there are no classic reflux symptoms while on medication.  Her upper endoscopy was entirely normal.  Biopsies of the distal  esophagus revealed only mild inflammation.  Throughout the course of the  interview today, she reiterates multiple vague complaints.She prepared a  letter or statement, which she read.   She states that she lives alone  and that there is no one to take care of her and no one to give her  oxygen...  She is worried about her heart and her breathing...  She asks  why she has difficulty eating...  She has had no weight loss.  As a  matter of fact, weight gain.  She denies dysphagia, melena, or jaundice.  She is status post cholecystectomy.   ALLERGIES:  NO KNOWN DRUG ALLERGIES.   MEDICATIONS:  1. Elavil 100 mg at night.  2. Nexium 40 mg b.i.d.  3. Xanax 0.5 mg q.i.d.  4. Levoxyl 200 mcg daily.  5. Vitamin C.  6. L-lysine.  7. Multivitamin.  8. Wellbutrin XL 150 mg daily.  9. Diovan 80 mg daily.  10.Isorbid 15 mg daily.  11.Aspirin 81 mg daily.  12.Advair discus b.i.d.  13.Unspecified nebulizer t.i.d.  14.Promethazine p.r.n.  15.Gas-X p.r.n.  16.Nitroglycerin sublingual p.r.n.  17.MiraLax p.r.n.   PAST MEDICAL HISTORY:  As above.   PAST SURGICAL HISTORY:  1. Rotator cuff surgery.  2. Hysterectomy.  3. Cholecystectomy.   FAMILY HISTORY:  No family history of gastrointestinal malignancy.   SOCIAL HISTORY:  The patient is single, lives alone, has a 12th grade  education, does not smoke currently.  Denies alcohol use.   REVIEW OF SYSTEMS:  Almost entirely positive.  See diagnostic evaluation  form.   PHYSICAL EXAMINATION:  Apathetic, somewhat depressed-appearing female in  no acute distress.  Blood pressure is 106/66, initial heart rate is 122 and regular, repeat  heart rate is 92 and regular.  Her weight is 231 pounds, she is 5 feet 5  inches in height.  HEENT:  Sclerae anicteric, conjunctivae are pink, oral mucosa is intact.  The lungs are clear to auscultation and percussion.  The heart is regular.  The abdomen is obese and soft with complaints of tenderness with minimal  palpation.  Good bowel sounds heard.  No masses felt, no hernia.  EXTREMITIES:  Without edema.   ASSESSMENT:  The patient presents with  chronic complaints of chest pain, dyspnea,  as  well as vague abdominal complaints.  Prior gastrointestinal workup  including colonoscopy, upper endoscopy, and abdominal ultrasound as  described above.  No meaningful response to high-dose proton pump  inhibitor therapy. Also, she has had negative cardiopulmonary  evaluations under the direction of her other physicians and has been  reassured by them that her problems are of neither a primary cardiac or  pulmonary disorder.  Given their negative evaluations, and the fact that  I do not think in any way that she suffers from a primary  gastrointestinal disorder that explains the bulk of her complaints, I  truly suspect that her problems are psychogenic in nature.  Whether this  is something as simple as anxiety with depression or something more  complicated, I am not certain. I am told that she has had some form of  psychiatric evaluation. I  discussed my impressions with the patient as  well as Dr. Waynard Edwards.  I do not see any merit in additional or  redundant gastrointestinal investigations or alteration in her GI  medications at this time.  As such, she will return to the care of Dr.  Waynard Edwards for further management.     Wilhemina Bonito. Marina Goodell, MD  Electronically Signed    JNP/MedQ  DD: 12/20/2006  DT: 12/20/2006  Job #: [id number]   cc:   Loraine Leriche A. Perini, M.D.  Elmore Guise., M.D.

## 2010-12-15 NOTE — H&P (Signed)
NAMESHENIA, ALAN                 ACCOUNT NO.:  [id number]   MEDICAL RECORD NO.:  [id number]          PATIENT TYPE:  INP   LOCATION:  1505                         FACILITY:  [facility]   PHYSICIAN:  Mark A. Perini, M.D.   DATE OF BIRTH:  11-23-1955   DATE OF ADMISSION:  07/30/2008  DATE OF DISCHARGE:                              HISTORY & PHYSICAL   CHIEF COMPLAINT:  Anxiety, slurred speech, delusional thoughts and  visual delusions.   HISTORY OF PRESENT ILLNESS:  Timothy is a 55 year old Caucasian female with  a past medical history as listed below.  She has been having some  increased anxiety issues.  Earlier in the month, she was seen on  July 11, 2008, at which time she felt convinced that she had, had a  stroke.  Her speech was somewhat slow at the time.  She seemed that she  had maybe been taking too much pain medicine based on her clinical  presentation.  She did have an MRI of the brain on July 12, 2008,  which showed no acute or subacute stroke.  She had some vague small  white matter lesions.  She was advised to decrease her hydrocodone to no  more than 4 per day for her chronic back and leg pain.  However, she has  continued to have problems and her boyfriend called today with concerns  that she had been staggering around with slurred speech and confusion.  She did present to the office today at which time she reported that she  has not been sleeping for the last 2 weeks.  She has been seeing some  things that are not there.  She says she sees little figures with big  heads.  She says she has been eating and drinking okay.  She denies any  fever.  She has had some weight loss.  She has had also some auditory  delusions, hearing voices like people talking and she will holler and  yell out for people to leave her house when there is not anybody there.  She denies any recreational drug use.  She denies any alcohol use.  She  says she has been taking 2 Vicodin twice a day.   She also has been on  her usual Xanax and she has been taking Reglan for reflux and stomach  issues.  In the office, she had slurred speech and had difficulty  walking without stumbling.  She is therefore admitted for further  evaluation and treatment.   PAST MEDICAL HISTORY:  1. Hypothyroidism.  2. Hysterectomy without oophorectomy in 1985.  3. Depression and anxiety which is chronic and severe.  4. G0 parity status.  5. Right shoulder surgery to her rotator cuff in 2001.  6. Cholecystectomy in April 2005.  7. Colon polyps noted in 2005.  8. She was hospitalized in 2005 for acute asthmatic bronchitis.  She      does have chronic asthma.  9. She had left forehead shingles with postherpetic symptoms in      February 2007.  10.She has acid reflux.  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  12.She had a negative cardiac workup in 2007, except she may have had      some coronary spasm seen on a catheterization and was therefore      treated with some isosorbide after this time.  13.She has had a chronically elevated white count since 2005.  14.Hyperlipidemia.   ALLERGIES:  1. GEMFIBROZIL MADE SICK ON HER STOMACH.  2. TYLOX DOES NOT WORK.  3. MILK OF MAGNESIA MADE HER SICK.  4. BACTRIM CAUSED NAUSEA AND VOMITING.  5. NITROGLYCERIN PATCH CAUSED LOCAL IRRITATION.  6. GRAPE FRUIT JUICE GIVES HER A BREATHING DIFFICULTY.  7. PRAVACHOL MADE HER DIZZY AND ANGRY.   CURRENT MEDICATIONS:  1. Xanax 0.5 mg three times a day.  2. Levoxyl 150 mg daily 6 days a week with no pill on the seventh day.  3. Elavil 100 mg each evening.  4. Phenergan as needed, although she says she has not had this in      several weeks.  5. Multivitamin daily.  6. Nexium 40 mg twice a day.  7. DuoNeb nebulized three times a day.  8. Wellbutrin XL 150 mg daily.  9. B12 500 mcg daily.  10.Diovan one-half of an 80 mg pill daily.  11.Aspirin 81 mg daily.  12.Isosorbide one-half of a 30 mg pill daily.   13.Vicodin 5/500 one-to-two pills every 6 hours as needed.  14.Symbicort 160/4.5 two puffs twice a day.  She has actually been      taking 1 puff twice a day recently.  15.  Reglan 5 mg, she says she      has been doing this four times a day recently.   SOCIAL HISTORY:  She is divorced since 75.  Her boyfriend, Chuck stays  with her.  The patient states that her father-in-law may have severe  alcoholism and that there is a lot of disruption at the household.  She  has no children.  She has been unemployed and she is disabled, although  she has been unable to get disability awarded to her which has been  somewhat frustrating to me because Sue has clearly been disabled for  the last several years.  She does smoke one pack of cigarettes a day;  however, she states she quit in September 2006.  She states that she  does not smoke, although she usually does smell of cigarettes.  She  denies alcohol or other drug use.  Father died at age 76 of diabetes in  a motor vehicle accident.  Mother died at age 66 of brain tumors.  She  is an only child.   REVIEW OF SYSTEMS:  As per the history present illness.   PHYSICAL EXAMINATION:  VITAL SIGNS:  Temperature 98.2, blood pressure  122/70, pulse 110.  Saturation 95% on room air.  GENERAL:  She laughs at  times somewhat inappropriately during the interview.  She does have  slurred speech and does have difficulty steadying herself, but she is  able to ambulate, but she does stumble from side-to-side at times.  She  has no focal weakness.  LUNGS:  Clear to auscultation bilaterally with no wheezes, rales or  rhonchi.  HEART:  Regular rate and rhythm with no murmur, rub or gallop.  EXTREMITIES:  There is no peripheral edema.  NEURO:  She is able to converse somewhat appropriately and normal at  other times in the interview.   LABORATORY DATA:  Pending.   ASSESSMENT/PLAN:  A 55 year old female with slurred  speech, increased  anxiety, insomnia and  probable auditory and visual delusional thoughts  and experiences.  We will admit her to [facility].  We will  ask for a psychiatry consultation.  I will stop her Vicodin and Reglan,  but I will continue her Xanax at her usual dose.  We will add Seroquel 25 mg each evening.  I will check a urine and serum  drug screen, as well as serum alcohol level and other admission lab  work.  She may need more intensive psychiatric care, but at this time  she does not present with any suicidal or homicidal thoughts.  She is a  full code status.      Mark A. Perini, M.D.  Electronically Signed     MAP/MEDQ  D:  07/30/2008  T:  07/30/2008  Job:  [id number]

## 2010-12-15 NOTE — H&P (Signed)
[name], DOWNARD NO.:  [id number]   MEDICAL RECORD NO.:  [id number]          PATIENT TYPE:  EMS   LOCATION:  ED                           FACILITY:  [facility]   PHYSICIAN:  Gaspar Garbe, M.D.DATE OF BIRTH:  1955/09/26   DATE OF ADMISSION:  12/03/2007  DATE OF DISCHARGE:                              HISTORY & PHYSICAL   CHIEF COMPLAINT:  Persistent cough, low blood pressure.   HISTORY OF PRESENT ILLNESS:  The patient is a 55 year old white female  with a prior history of significant tobacco abuse and subsequent COPD  with history of chronic chest pain, anxiety and depression, who came to  the [facility] emergency room today because of persistent cough and  headache.  The cough has been taking place over the past 4 days with  headache just over the past 2.  She has indicated that she may or may  not have elevated temperatures.  She said she had taken her temperature  before and found it to be 100 to 102, but has mostly just felt warm.  She has not had any nausea, vomiting or muscle weakness.  She was given  Rocephin and azithromycin by the ER physician and a liter of fluid.  After 2 liters, her blood pressure was still low and she said she felt  slightly dizzy, therefore warranting an observation admission.  I was  asked to come and admit to the patient.   The patient is attended by family who all smell of cigarette smoke and  indicate that they smoke around her.  She indicates strict compliance  with her Symbicort and albuterol, but simply has not been able to feel  better over the weekend.  She denies any other sick relatives or any  travel outside the country or to fly endemic areas.   ALLERGIES:  NO KNOWN DRUG ALLERGIES.   MEDICATIONS:  1. Vicodin 2-3 tablets per day, mostly for back pain.  2. Multivitamin.  3. B12 500 mcg a day.  4. L-lysine.  5. Xanax 0.5 mg b.i.d.  6. Phenergan p.r.n.  7. Isosorbide 15 mg p.o. daily.  8. Reglan t.i.d.  9.  Nexium 40 mg b.i.d.  10.Diovan 80 mg p.o. daily.  11.Aspirin 81 mg daily.  12.Synthroid 150 mcg p.o. daily.  13.Wellbutrin 150 mg p.o. daily.  14.Elavil 100 mg p.o. nightly.  15.Symbicort 160 two puffs twice daily.  16.Albuterol nebulizer at home.   PAST MEDICAL HISTORY:  1. COPD.  The patient was last hospitalized in November for      exacerbation.  2. Chronic asthma.  3. Anxiety and depression.  4. Hypothyroidism.  5. Atypical chest pain with subsequent catheterization which was      negative in January 2008.  6. Gastroesophageal reflux disease and poor motility.   SOCIAL HISTORY:  The patient lives in [place] with her boyfriend, who  is an active smoker.  She is divorced.  She could not indicate how long  she has quit smoking, but indicates that Dr. Waynard Edwards would know.  She has  at least a 20 pack year  history prior to this.  She does not drink  alcohol.  She is currently disabled.   FAMILY HISTORY:  Positive for congestive heart failure and coronary  artery disease.   REVIEW OF SYSTEMS:  The patient has a headache, but denies any sore  throat or neck pain.  Denies any chills or fever at present, but had  them yesterday.  Denies any nausea or vomiting.  She indicates shortness  of breath, but no real dyspnea on exertion.  Her cough and wheezing that  has not gone away.  Review of systems is otherwise negative on a 10  point scale.   ADVANCED DIRECTIVES:  The patient is full code.   PHYSICAL EXAMINATION:  VITAL SIGNS:  Temperature 97.4, pulse 90,  respiratory rate 18, blood pressure 102/60.  GENERAL:  No acute distress.  HEENT:  Normocephalic, atraumatic.  PERRLA.  EOMI.  ENT within normal  limits.  NECK:  Supple, no lymphadenopathy, JVD or bruit.  HEART:  Regular rate and rhythm.  No murmur, rub or gallop.  LUNGS:  The patient has bilateral wheezes on expiration.  No crackles  are heard.  No other adventitious sounds.  No egophony.  ABDOMEN:  Soft, nontender.   Normoactive bowel sounds.  No  hepatosplenomegaly.  EXTREMITIES:  No clubbing, cyanosis or edema.  NEUROLOGIC:  The patient is oriented to person, place and time.  No  neurologic deficits noted.   Chest x-ray is negative for pneumonia or active disease.   LABORATORY DATA:  White count 11.6, hemoglobin 14.4, hematocrit 42.2,  platelets 244.  BUN and creatinine are 5 and 0.8 respectively.  Electrolytes are otherwise within normal limits.  Set of cardiac enzymes  negative with a troponin less than 0.05.  BNP is less than 30.  Her MCV  on her CBC is elevated to 102.   ASSESSMENT/PLAN:  1. Chronic obstructive pulmonary disease exacerbation, possibly      bronchitis.  The patient has received Rocephin and azithromycin in      the emergency room and will continue this.  Will also be giving her      prednisone 40 mg a day and increasing her nebulizer timing to every      4 hours as needed.  She remain on her Symbicort.  Family members      were told not smoke near her as this can worsen her symptoms even      though she is not actively smoking.  They seem to be aware of this      and willing to comply.  She will have a repeat chest x-ray in the      morning to make sure that she does not fluff out a pneumonia with      the added hydration.  2. Hypotension.  Will discontinue her Diovan and hold her isosorbide      until the morning.  If her blood pressure is greater than 100, we      may continue this.  Most likely dehydration but her BMET does not      match this.  She can't be sent home with a systolic blood pressure      in the 70s to 90s.  This may also be part of her ongoing illness.      She clearly warrants observation as she cannot be sent home from      the emergency room.  3. Anxiety and depression.  No change to meds.  4. Gastroesophageal reflux disease.  No changes to medications.  5. Hypertension held as above.  6. Headache.  She will use her Vicodin while in-house on a q.6       schedule.      Gaspar Garbe, M.D.  Electronically Signed     RWT/MEDQ  D:  12/03/2007  T:  12/03/2007  Job:  [id number]   cc:   Loraine Leriche A. Perini, M.D.  Fax: [phone]

## 2010-12-15 NOTE — Discharge Summary (Signed)
NAMECATHERYN, [name]                 ACCOUNT NO.:  [id number]   MEDICAL RECORD NO.:  [id number]          PATIENT TYPE:  INP   LOCATION:  1505                         FACILITY:  [facility]   PHYSICIAN:  Mark A. Perini, M.D.   DATE OF BIRTH:  10/01/1955   DATE OF ADMISSION:  07/30/2008  DATE OF DISCHARGE:  08/01/2008                               DISCHARGE SUMMARY   DISCHARGE DIAGNOSES:  1. Delirium.  2. Anxiety.  3. Depression.  4. Possible medication side effects to Reglan and Elavil and possibly      Wellbutrin.  5. Hypothyroidism.  6. Dependent personality disorder.  7. Acid reflux.  8. Hyperlipidemia.  9. Possible history of coronary spasm in the past.  10.Relative hypotension this admission requiring discontinuation of      her Diovan and isosorbide.   PROCEDURES:  Psychiatry consultation.   DISCHARGE MEDICATIONS:  1. She is to resume Xanax 0.5 mg 3 times a day.  2. She is to resume Levoxyl 150 mcg 1 daily except no pill every      Sunday.  3. Her Elavil dose is now reduced to 25 mg every evening.  4. She is to try to avoid promethazine.  5. She is to resume her multivitamin and her Nexium 40 mg twice a day      and her DuoNeb nebulized 3 times daily.  6. She is to stop Wellbutrin and stop Diovan and stop isosorbide and      she is to stop all use of Reglan.  7. She is to resume of B12 at 500 mcg daily.  8. Aspirin 81 mg daily.  9. She is to use Tylenol 500 mg 1 to 2 pills every 8 hours as needed      for pain.  10.She is to try to avoid Vicodin although she does have significant      neck pain and I did give her a prescription for 1 Vicodin every 8      hours as needed for pain, #60, with 1 refill.  She may use this      once or twice a day if needed for severe pain.  11.She is to resume Symbicort 160/4.5 at 2 puffs twice a day.  12.She may resume her fish oil supplement.  13.She is on a new medication Seroquel 25 mg each evening and a      prescription is given for  this.   HISTORY OF PRESENT ILLNESS:  [name] is a 55 year old female with chronic  anxiety and depression issues.  She presented to the office with  confusion, some delirium, with some auditory and visual delusions, as  well as gait instability and trouble walking.  She was admitted for  further care.   HOSPITAL COURSE:  [name] remained stable from a cardiovascular and  pulmonary standpoint.  She had an EKG performed on July 31, 2008,  which showed normal sinus rhythm and rightward axis but no acute changes  and her QTC was 405 ms.  Psychiatry was consulted and it was noted that  she is to stay off the Wellbutrin.  When her delirium fully clears, she  may be started eventually on Celexa and she may eventually have her  Elavil tapered completely off and she may be tried on trazodone in the  future.  However, these future changes will be left to outpatient  psychiatry which will be arranged hopefully in the next week at a nearby  facility.  Her blood pressures ran in the 90s and 80s and therefore her  Diovan and isosorbide were discontinued.  She was never symptomatic with  this.  On the date of discharge, she was much more clear mentally.  She  was having no delusions on the day of discharge, however, the night  prior to discharge she did have a period where she felt that she was  floating above her bed.  She had no suicidal or homicidal thoughts at  any time during her stay.  She was deemed stable for discharge home to  the care of her longstanding boyfriend, Virl Diamond, who was present in the  room at the time of her discharge.   DISCHARGE PHYSICAL EXAM:  Temperature 98.3, afebrile.  Pulse 79.  Respiratory rate 22.  Blood pressure 96/58, 93% saturation on room air.  She was in no acute distress, sitting in the bed.  LUNGS:  Clear to auscultation bilaterally with no wheezes, rales, or  rhonchi.  HEART:  Regular rate and rhythm with no murmur, rub, or gallop.  ABDOMEN:  Soft, nontender,  nondistended with no edema.  She was alert.   DISCHARGE LABORATORY DATA:  A B12, TSH, RPR, and RBC folate were drawn  at the time of discharge but are pending at this time.  A urine culture  on July 30, 2008, was negative.  On July 30, 2008, alcohol level  was negative.  Urine drug screen was negative with the exception of  benzodiazepines on admission.   DISCHARGE INSTRUCTIONS:  [name] is to follow a low-salt, low-fat diet.  She is to increase her activity slowly.  She is to call for a visit in  my office in 1 week and she is to see a psychiatrist in the next week  and the clinical social worker at the hospital is going to help  facilitate this.  We have also ordered home health occupational therapy  consultation to help assure safety at home.  She is to call if she has  any recurrent problems and she is to come to the ER if she has any  severe problems.      Mark A. Perini, M.D.  Electronically Signed     MAP/MEDQ  D:  08/01/2008  T:  08/01/2008  Job:  [id number]

## 2010-12-15 NOTE — Consult Note (Signed)
[name] NO.:  [id number]   MEDICAL RECORD NO.:  [id number]          PATIENT TYPE:  INP   LOCATION:  1505                         FACILITY:  [facility]   PHYSICIAN:  Antonietta Breach, M.D.  DATE OF BIRTH:  1956-07-04   DATE OF CONSULTATION:  07/31/2008  DATE OF DISCHARGE:                                 CONSULTATION   REASON FOR CONSULTATION:  Confusion, psychosis, depression.   HISTORY OF PRESENT ILLNESS:  [name] is a 55 year old female  admitted to the [facility] on July 30, 2008, with  delusions, anxiety, unsteady gait.   [name] requested that her long-term boyfriend who lives with her the  present in the room to help with education and support.   He also is helping with the history given the patient's cognitive and  memory difficulties.   [name] did develop a short period of confusion and hallucinations  approximately one month ago for about one day and that passed.   She also has been experiencing greater than 4 months of progressive  depressed mood, decreased energy, anhedonia, insomnia and thoughts of  hopelessness.  She has not had any thoughts of suicide.   She does have motivation to live and wants to be rid of her symptoms.  She does have insight into some of the other symptoms including fuzzy,  hearing music that is not there, seeing little people.  These  hallucinations and decreased attention have been occurring for  approximately 30 hours.   She is having difficulty with orientation and partial memory impairment.  She is not combative.  She is cooperative with care   PAST PSYCHIATRIC HISTORY:  No history of increased energy or decreased  need for sleep.  She does have a history of multiple major depressive  episodes as that of the above symptoms.   The first major depressive episode occurred approximately 25 years ago.  She was started on Elavil for that.  The Elavil has been maintained over  the  years.  The current dosage is 100 mg q.h.s.  She and her boyfriend  did not know of any change in the Elavil dosage for over one year and  potentially several years.   [name] was started on Wellbutrin approximately 4 weeks ago and this  was after the short lived delirium episode.  She was initially started  150 mg XL q.a.m., but in order to purchase the generic cheaper brand,  she was converted to 100 mg b.i.d.  She then discontinued that  approximately 2 weeks ago for some unknown reason.   She has been maintained on Synthroid.  She also has been treated with  Xanax 0.5 mg t.i.d. for acute anxiety.   She has no history of suicide attempts, other forms of psychosis or  psychiatric hospitalizations.  She does have worry and feeling on edge.   Ms. Virgo redeveloped depression in 12/23/1994 after her mother passed away.  She was treated with Prozac at that time and she did improve and her  depression went into remission.  It is unclear whether  the Prozac was  continued with the Elavil or not.   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   FAMILY PSYCHIATRIC HISTORY:  Her mother attempted suicide.  Her mother  eventually died of a brain tumor.   SOCIAL HISTORY:  Mr. [name] denies any alcohol or illegal drugs.  She is  disabled.  She has applied for disability support multiple times.  She  lives with her boyfriend who provides the income to the house and works  regularly.  She has no children.  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   PAST MEDICAL HISTORY:  She did have some heart arrhythmia of some nature  that was diagnosed by a physician some time ago.  She is not specific  when and what type.  When PAT is mentioned, she appears to have some  recognition of that term.   [name] has been concern that she has had a stroke or that she has  had at a brain tumor like her mother.  An MRI on  December 11 of the  brain showed no acute or subacute stroke.  There were some vague, small  white matter lesions.   She has been taking Hydrocodone regularly.  She has been taking Xanax as  an outpatient.   Hypothyroidism, history of oophorectomy and hysterectomy,  cholecystectomy, history of shingles, gastroesophageal reflux disease.   That is medical transcription please as to the past psychiatric is in  the form of   MEDICATIONS:  MAR is reviewed.  She currently is on:  1. Elavil 100 mg q.h.s.  2. Xanax 0.5 mg t.i.d.  3. Wellbutrin 150 mg daily.  4. Synthroid 150 mcg daily.  5. Seroquel 25 mg q.h.s. which has just been started.   ALLERGIES:  LOPID, PERCOCET, NITROGLYCERIN, BACTRIM, MILK OF MAGNESIA,  PRAVACHOL   LABORATORY DATA:  Alcohol negative.  Drug screen was positive for  benzodiazepines.  Phosphorus negative.  Magnesium 2.4, sodium 139, BUN  and creatinine normal.  SGOT 21, SGPT 20, albumin was just 0.1 below  normal at 3.4, WBC 12.9, hemoglobin 12.5, platelet count 298.   REVIEW OF SYSTEMS:  Noncontributory.   PHYSICAL EXAMINATION:  VITAL SIGNS:  Temperature is 97.4 and review of  the temperatures during hospitalization reveals no fever, pulse 76,  respiratory rate 16, blood pressure 97/62, O2 saturation on room air  98%.  GENERAL:  [name] is a middle-aged female partially reclined in a  supine position in her hospital bed with no abnormal involuntary  movements.   MENTAL STATUS EXAM:  [name] has a slight clouding of consciousness.  Her eye contact is good and her concentration is mildly decreased.  Her  affect is constricted.  Her mood is depressed.  There is some slight  anxiety.  She is oriented to the place and person but not the year or  the month.  She does recognize her boyfriend.   Memory.  Visual objects were checked 3/3 immediate with 2/3 at 5  minutes.  Fund of knowledge and intelligence below that of her estimated  premorbid baseline.   Speech involves normal rate and a slightly flat  prosody.  There is no dysarthria.  Thought process is logical, coherent  and goal-directed.  Thought content:  No thoughts of harming herself or  others no delusions or hallucinations at the time of exam.  Insight is  actually within the  normal range.  She has insight into her depression  symptoms.  She also knows that she has been having difficulty with  memory and orientation.  Her judgment is impaired for self-care outside  the hospital.   ASSESSMENT:  AXIS I:  (293.00)  Delirium not otherwise specified.  (296.33)  Major depressive disorder recurrent severe.  (293.84)  Anxiety disorder not otherwise specified.  AXIS II:  Deferred.  AXIS III:  See past medical history.  AXIS IV:  General medical.  AXIS V:  40.   As mentioned above, [name] would be at risk for self-neglect without  24-hour supervision.  She is not at risk for suicide or harming others.   [name] does have intact short-term recall and she does agree to call  emergency services immediately for any thoughts of harming herself,  thoughts of harming others or increased symptoms.   RECOMMENDATIONS:  Given a history of some form of cardiac arrhythmia,  would check an EKG.  If the QTC is greater than 500 milliseconds would  discontinue the Seroquel.  A consideration would need to be made  regarding the QTC duration once the Elavil is decreased as well.  If the  QTC is initially above 500, but then falls below 500, Zyprexa would be a  better alternative regarding QTC risk.   Regarding the patient's antidepression treatment, will defer any new  agents at this time.  However, Celexa would likely be a good candidate  once her delirium is clear.   With the anticholinergic side-effect of Elavil, and given her recurrence  of depression on the Elavil without the presence of chronic pain, would  taper the Elavil off starting with a reduction down to 25 mg q.h.s.  This will  increase her delirium threshold.   Regarding Wellbutrin, would discontinue that as well given that her  symptoms involve a mixture of depression and anxiety.  In this state, a  selective serotonin reuptake inhibitor, such as Celexa, would be an  appropriate first step for anti-insomnia and Augmentin, Celexa and  trazodone could be started 25 mg q.h.s. increasing by 25 mg per evening  based upon the previous night's sleep not to exceed 200 mg q.h.s. and  with caution about hypotension as a side-effect.   Would confirm that her recent thyroid blood level has been checked, and  if not, would check a TSH.   If she does persist with delirium after the reduction of Elavil, would  proceed with further organic screening confirming that a reversible  cognitive dysfunction workup has been conducted including B12, folic  acid, RPR and would consider further organic workup with neurology and a  repeat MRI of the brain.   Once all of the delirium symptoms clear.  Please see the note above  about trazodone.  Would start Celexa 10 mg q.a.m. and then increase  within 4 days to 20 mg p.o. q.a.m. as tolerated.   [name] will not drive if drowsy.   Would ask the social worker to set [name] up with psychiatric follow-  up once she is medically cleared.  Outpatient psychiatric follow-up can  be obtained at one of the clinics attached to [facility], [place] or [facility].      Antonietta Breach, M.D.  Electronically Signed     JW/MEDQ  D:  07/31/2008  T:  07/31/2008  Job:  [id number]

## 2010-12-18 NOTE — Discharge Summary (Signed)
NAME:  DREANA, BRITZ                           ACCOUNT NO.:  [id number]   MEDICAL RECORD NO.:  [id number]                   PATIENT TYPE:  INP   LOCATION:  3031                                 FACILITY:  [facility]   PHYSICIAN:  Mark A. Perini, M.D.                DATE OF BIRTH:  08-18-1955   DATE OF ADMISSION:  12/28/2003  DATE OF DISCHARGE:  12/30/2003                                 DISCHARGE SUMMARY   DISCHARGE DIAGNOSES:  1. Acute asthmatic bronchitis with hypoxia, improving at the time of     discharge.  2. Chronic tobacco abuse, counseled.  3. Anxiety and depression, chronic.  4. Recent cholecystectomy.  5. History of total abdominal hysterectomy with salpingo-oophorectomy.  6. Colon polyps.  7. Insomnia.  8. Hypothyroidism.  9. Gastroesophageal reflux disease.   PROCEDURES:  None.   DISCHARGE MEDICATIONS:  1. She is to resume Xanax 0.5 mg t.i.d.  2. Levoxyl 175 mcg once daily as before.  3. Elavil 100 mg at bedtime as before.  4. Phenergan as needed as before.  5. Vitamin C.  6. Garlic pill.  7. L-lysine.  8. Multivitamin.  9. Lasix 20 mg once daily as needed for edema as before.  10.      Nexium 40 mg daily or Prilosec 20-40 mg once daily as before.  11.      Lexapro 20 mg once daily as before.  12.      Calcium and vitamin D as previously.  13.      She is to stop her vitamin E.  14.      Prednisone taper as instructed.  15.      Over-the-counter Mucinex 600 mg, 1-2 tablets up to twice daily for     cough or congestion.  16.      Over-the-counter nicotine patch.  17.      Augmentin 875 mg, 1 pill twice daily with food for three further     days.  18.      Tylenol as needed.  19.      DuoNeb nebulizer treatment three times daily until we discontinue     it.  20.      Tussionex 2.5 ml to 5.0 ml up to twice daily as needed for severe     cough.  21.      She is stop smoking.   HISTORY OF PRESENT ILLNESS:  Ms. Mongeau is a 55 year old female with long-  standing  tobacco use history which is ongoing.  She developed a cough and  bronchitis-type symptoms with fever and headache earlier in the week prior  to admission.  She was treated with a Z-Pak.  Despite that she did not  improve whatsoever, and presented to the emergency room where she was found  to be hypoxic with significant wheezing and decreased breath sounds.  She  was admitted for further care.  HOSPITAL COURSE:  Ms. Whitehair was admitted.  She was placed on empiric  Rocephin.  Chest x-ray did not show any pneumonia.  For her bronchospasm,  she was treated with Solu-Medrol, which was eventually transitioned to  prednisone.  She was also treated with Xopenex and Atrovent nebulizers and  Advair.  With these therapies, she gradually improved, and by 12/30/03 she  was deemed stable for discharge home.   DISCHARGE PHYSICAL EXAMINATION:  VITAL SIGNS:  Afebrile, temperature 97.4,  pulse 92, respiratory rate 20.  Blood sugars ranged from 130-208.  Blood  pressure was 145/71.  O2 saturation 99% on 2 L; 95% saturation on room air.  This ranged from 88-92% on room air with ambulation.  GENERAL:  She was in no acute distress.  LUNGS:  She had coarse breath sounds bilaterally but better air movement  than upon admission.  Some scattered wheezes are noted.  HEART:  Regular rate and rhythm, varying to tachycardic with no murmurs,  rubs or gallops.  ABDOMEN:  Soft and nontender.  There was no edema.   DISCHARGE LABORATORY DATA:  White count 18,000 with 89% segs, 10%  lymphocytes, 1% monocytes.  Hemoglobin 15.3, platelet count 361,000.  Sodium  139, potassium 4.0, chloride 104, CO2 25, BUN 7, creatinine 0.9, glucose  175.  Calcium 9.3.  TSH was 1.807.  Hemoglobin A1c was 5.4.  Phosphorus was  1.4, which was low on 12/28/03 and magnesium was 1.9.   DISCHARGE INSTRUCTIONS:  Ms. Blumenstock is to be up as tolerated.  She is to  call if there is any recurrent problem.  She is to keep her followup visit  in five  days with me in the office.  Finally, her phosphorus level was low  and we will give her some K-Phos supplementation for three days.                                                Mark A. Waynard Edwards, M.D.    MAP/MEDQ  D:  12/30/2003  T:  12/30/2003  Job:  [id number]

## 2010-12-18 NOTE — Op Note (Signed)
[place]  Patient:    [name], [name]                        MRN: [id number] Proc. Date: 02/24/00 Adm. 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  Also with some attritional tearing anterior, superior labrum and partial tearing, long head biceps tendon.  No full thickness tears, rotator cuff.  OPERATION PERFORMED:  Right shoulder examination under anesthesia, arthroscopy with debridement of rotator cuff and labral tears.  Arthroscopic acromioplasty, coracoacromial ligament release and distal clavicle excision.  SURGEON:  Loreta Ave, M.D.  ASSISTANT:  Arlys John D. Petrarca, P.A.-C.  ANESTHESIA:  General.  ESTIMATED BLOOD LOSS:  Minimal.  SPECIMENS:  None.  CULTURES:  None.  COMPLICATIONS:  None.  DRESSING:  Soft compressive with sling.  DESCRIPTION OF PROCEDURE:  Patient was brought to the operating room and after adequate anesthesia had been obtained, right shoulder examined.  Full motion, stable shoulder.  Placed in a beach chair position on the shoulder positioner, prepped and draped in the usual sterile fashion.  Three standard arthroscopic portals, anterior, posterior and lateral.  Shoulder entered with blunt obturator and distended and inspected.  Marked tearing of entire crescent portion of the cuff which was debrided.  Cable still well anchored.  No full thickness tears.  Some mild attrition of the biceps near the attachment debrided but still  well anchored.  No SLAP lesion.  Attritional tearing anteruior superior labrum debrided.  Articular cartilage, capsular ligamentous structures intact.  Cannula redirected subacromially.  Marked chronic impingement from a downsloping type 2 to 3 anterior acromion also some marked spurring at the [facility] joint where there was marked distal clavicle osteolysis and grade 4 changes.  Bursa resected.  Cuff debrided and assessed, no full thickness tears.  Acromioplasty converted to a type 1 acromion with shaver and a high speed bur.  CA ligament incised and released.  Distal clavicle exposed and the lateral centimeter sharply resected with shaver and high speed bur. Adequacy of decompression of distal clavicle excision confirmed viewing from all portals.  Instruments and fluid removed.  Portals, shoulder and bursa injected with Marcaine.  Portals closed with 4-0 nylon.  Sterile compressive dressing applied.  Anesthesia reversed.  Brought to recovery room.  Tolerated surgery well.  No complications. DD:  02/24/00 TD:  02/25/00 Job: [id number]

## 2010-12-18 NOTE — Cardiovascular Report (Signed)
NAMEDAJAI, WAHLERT NO.:  [id number]   MEDICAL RECORD NO.:  [id number]          PATIENT TYPE:  OIB   LOCATION:  2899                         FACILITY:  [facility]   PHYSICIAN:  Elmore Guise., M.D.DATE OF BIRTH:  [birth year]   DATE OF PROCEDURE:  08/16/2006  DATE OF DISCHARGE:                            CARDIAC CATHETERIZATION   INDICATIONS FOR PROCEDURE:  Continued exertional chest pain and dyspnea  in patient with multiple risk factors.   DESCRIPTION OF PROCEDURE:  The patient was brought to the cardiac cath  lab after appropriate informed consent.  She was prepped and draped in  sterile fashion.  Approximately 20 cc of 1% lidocaine was used for local  anesthesia.  A 7-French sheath was placed in the right femoral vein and  a 6-French sheath was placed in the right femoral artery.  Right heart  catheterization was then performed followed by left heart  catheterization with coronary angiography, LV angiography.  Due to  proximal RCA vasospasm, the patient did have IC nitroglycerin given with  total resolution of her vasospasm.  A limited root shot was performed to  stay out of the RCA ostium and showed total resolution of her vasospasm.  The patient was transported from the cardiac cath lab in stable  condition.   FINDINGS:  Right heart cath measurements -    RA:  8 mmHg.    RV:  38/4 mmHg.    PA:  32/18 mmHg.    PCWP:  15 mmHg.    LVEDP:  10 mmHg.   Cardiac output is 7.8 liters per minute with cardiac index of 3.8.   CORONARIES:  1. Left Main:  Short no significant disease.  2. LAD:  Mild luminal irregularities.  3. D1:  Moderate size with mild luminal irregularities.  4. LCX:  Nondominant large vessel with mild luminal irregularities.  5. OM-1:  A small vessel with mild luminal irregularities.  6. OM-2/OM-3:  Moderate size with mild luminal irregularities.  7. RCA:  Dominant with mild luminal irregularities.  Proximal      vasospasm was noted.  She  did get intracoronary nitroglycerin with      resolution.  A limited aortic root shot was performed showing total      resolution of her proximal RCA vasospasm.  8. LV:  EF of 55-60%.  No wall motion abnormalities.  LVEDP was 10      mmHg.   IMPRESSION:  1. Normal-appearing coronary arteries with mild luminal      irregularities.  2. 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  3. Normal left ventricular systolic function with ejection fraction of      55-60%.  4. Borderline high pulmonary artery pressures with right ventricular      pressure 38/4 mmHg and pulmonary artery systolic pressure of 32/18      mmHg.   PLAN:  At this time, we will continue her Diovan, add long-acting  nitroglycerin, Imdur 30 mg once  daily for possible spasm component to  her symptoms.  Otherwise, she will continue with aggressive risk factor  modification as indicated.      Elmore Guise., M.D.  Electronically Signed     TWK/MEDQ  D:  08/16/2006  T:  08/16/2006  Job:  [id number]   cc:   Loraine Leriche A. Perini, M.D.

## 2010-12-18 NOTE — H&P (Signed)
NAME:  [name], MOEHLE                           ACCOUNT NO.:  [id number]   MEDICAL RECORD NO.:  [id number]                   PATIENT TYPE:  INP   LOCATION:  1824                                 FACILITY:  [facility]   PHYSICIAN:  Mark A. Perini, M.D.                DATE OF BIRTH:  1955/11/05   DATE OF ADMISSION:  12/28/2003  DATE OF DISCHARGE:                                HISTORY & PHYSICAL   CHIEF COMPLAINT:  Shortness of breath and cough.   HISTORY OF PRESENT ILLNESS:  [name] is a 55 year old female with  significant tobacco abuse history which is ongoing, hypothyroidism,  significant anxiety and depression issues, and a recent cholecystectomy 1  month ago.  She was in her usual state of health until approximately 5-7  days prior to admission at which time she developed bronchitis-type symptoms  with cough, fever, headache and head and nose congestion and body aches.  She was treated with a Z-Pak.  Despite this she has not improved whatsoever  and in fact has gotten worse.  She has shortness of breath and cough, she  has had subjective fever and chills.  She has had wheezing as well.  She  presented to the emergency room today and was found to be tachycardic and  hypoxic with significant bronchospasm on lung exam that was not improved  with emergency room treatments.  She will require admission for further  care.   PAST MEDICAL HISTORY:  1. Hypothyroidism.  2. History of a total abdominal hysterectomy without oophorectomy in 1985.  3. History of tonsillectomy/adenoidectomy and a D&C in 1984.  4. History of depression and anxiety which is significant.  5. G0 parity status.  6. Right shoulder rotator cuff surgery in 2001.  7. Status post cholecystectomy on November 01, 2003.  8. Colon polyps discovered and internal hemorrhoids discovered by     colonoscopy in March 2005.   ALLERGIES:  None.   MEDICATIONS:  1. Xanax 0.5 mg p.o. t.i.d.  2. Levoxyl 175 mcg once daily.  3. Elavil  100 mg at bedtime.  4. Phenergan 25 mg p.r.n.  5. Vitamin C two tablets daily.  6. Garlic pill daily.  7. L-Lysine two tablets daily.  8. Multivitamin daily.  9. Lasix 20 mg as needed which she has not used lately.  10.      Nexium 40 mg daily or Prilosec if she cannot obtain Nexium.  The     Prilosec does not work as well for her.  11.      Lexapro 20 mg daily.  12.      Calcium and vitamin D daily.  13.      Vitamin E daily.   SOCIAL HISTORY:  She was divorced in 75.  She has not had gainful  employment in 10 years mostly due to anxiety and depression issues.  She  lives off of $500  month in [place].  She has no children.  She smokes at  least one pack a day and has done so for 30 or so years.  No alcohol or drug  use history.  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   FAMILY HISTORY:  Father is alive with diabetes, mother died at age 31 with  brain tumors, one uncle had stroke and MI, she is an only child.   REVIEW OF SYSTEMS:  As per the history of present illness.  She denies any  blood from above or below.  She has had some increasing anxiety with these  lung symptoms.   PHYSICAL EXAMINATION:  VITAL SIGNS:  Temperature 98.6, blood pressure  119/78, pulse 120 sinus tachycardia, respiratory rate 24, oxygen saturation  96% on oxygen, 90% on room air.  EYES:  There is no icterus.  SKIN:  No pallor.  LUNGS:  Very tight breathing with decreased breath sounds bilaterally, there  are inspiratory and expiratory wheezes noted throughout both lung fields,  and there are scattered rhonchi noted throughout both lung fields.  HEART:  Tachycardic with no murmur.  ABDOMEN:  Soft and nontender, her laparoscopic cholecystectomy scars are  well healed, there is no peripheral edema.  NEUROLOGIC:  She is alert and oriented and neurologically intact.   LABORATORY DATA:  Pending at the time of admission.  Chest x-ray personally  reviewed shows no  active disease.   ASSESSMENT AND PLAN:  Acute asthmatic bronchitis with hypoxia and  tachycardia in an avid smoker.  It is not clear that this is an emphysema  exacerbation as she has not been formally diagnosed with emphysema and there  is no evidence of chronic obstructive pulmonary disease on her chest x-ray  however, given the patient's smoking history, it is suspected that tobacco  is playing a role in her acute disorder.  We will admit her.  We will treat  her with intravenous steroids as well as Xopenex and Atrovent nebulizers.  We will treat her empirically with Rocephin.  We will check admission  laboratory data including electrolytes and thyroid studies.  We will  continue her home medications.  We will increase her dose of Xanax slightly  and give her Ambien as needed for insomnia.  We will ask for ask for a case  manager consult given her financial strain issues.                                                Mark A. Waynard Edwards, M.D.    MAP/MEDQ  D:  12/28/2003  T:  12/28/2003  Job:  [id number]

## 2010-12-18 NOTE — Consult Note (Signed)
[name], [name] NO.:  [id number]   MEDICAL RECORD NO.:  [id number]          PATIENT TYPE:  INP   LOCATION:  2021                         FACILITY:  [facility]   PHYSICIAN:  Jordan Hawks. Elnoria Howard, MD    DATE OF BIRTH:  January 30, 1956   DATE OF CONSULTATION:  12/04/2006  DATE OF DISCHARGE:                                 CONSULTATION   REASON FOR CONSULTATION:  Is atypical chest pain.   HISTORY OF PRESENT ILLNESS:  This is a 55 year old female with past  medical history of hypothyroidism, depression, anxiety, rotator cuff  repair, colon polyps and status post cholecystectomy in 2005 who is  admitted to the hospital with complaints of atypical chest pain.  The  patient apparently had these types of pains in December and she was  admitted at that time without any clearly identifiable cause for her  chest pain.  She underwent a cardiac catheterization on 08/29/2006 which  was noted to be negative.  At this time the patient complains of these  similar types of symptoms despite being treated by Dr. Marina Goodell with  increasing dosages of Nexium.  She states that the pain is constant  however it is exacerbated with p.o. intake.  Nothing apparently resolves  her symptoms and is also associated with dysphagia. At times she will  have nausea and vomiting with these types of symptoms.  On the day of  admission she did complain about having 10/10 chest pain with radiation  to her back and there was a report as  shortness of breath.  The patient  was to have an EGD performed by Dr. Marina Goodell in  the middle of May, however  in light of her current symptoms a GI in-hospital consultation is  requested to help expedite the workup.   PAST MEDICAL AND SURGICAL HISTORY:  Is as stated above.   FAMILY HISTORY:  Is noncontributory for current disease.   SOCIAL HISTORY:  The patient is divorced, negative for alcohol or  illicit drug use.  Prior history of tobacco but she has recently quit.   ALLERGIES:  NO KNOWN DRUG ALLERGIES.   MEDICATIONS:  1. Xanax of 0.5 mg p.o. q.i.d.  2. Elavil 100 mg p.o. q.h.s.  3. Aspirin 325 mg p.o. daily  4. Lovenox 40 mg subcu  5. Advair 1 puff b.i.d.  6. Avapro 150 mg p.o. daily  7. Imdur 30 mg p.o. daily  8. Levothyroxine 200 mcg micrograms p.o. daily  9,  Protonix 40 mg p.o. b.i.d  1. Ventolin 2.5 mg q.6 hours p.r.n.  2. Atrovent 0.5 mg q.6 h p.r.n.  3. Morphine sulfate 2 mg IV q.2 h  4. Nitroglycerin 0.4 mg subcu q. 5 minutes p.r.n. x3  5. Zofran 4 mg IV q.6 h p.r.n.   REVIEW OF SYSTEMS:  Is as stated above in the history present illness.  Additionally the patient reports multiple positivities for her review of  systems and the patient was positive for every stated review of systems   PHYSICAL EXAMINATION:  VITAL SIGNS:.  Blood pressure is 105/73, heart  rate is  80, respirations 18, temperature is 97.8.  Generally  the  patient is in no acute distress, alert and oriented.  HEENT:  Normocephalic, atraumatic.  Extraocular muscles intact.  NECK:  Supple.  No lymphadenopathy.  LUNGS:  Clear to auscultation bilaterally.  CARDIOVASCULAR:  Regular rate and rhythm.  ABDOMEN:  Obese, soft.  There is no tenderness in the epigastric region.  Positive bowel sounds.  EXTREMITIES:  No clubbing, cyanosis or edema.   LABORATORY VALUES:  Sodium was 133, potassium 3.8, chloride 98, CO2 25,  glucose 88, BUN 11, creatinine 0.8, total bilirubin 0.5, alk phos 86,  AST is 19, ALT 20, albumin 3.2.   IMPRESSION:  1. Noncardiac chest pain.  2. After evaluation the patient, apparently she has failed PPI therapy      and an EGD is warranted at this time in light of her current      symptoms.  I do not believe it is unreasonable to perform an EGD      while she is in the hospital.   PLAN:  Is for an EGD by Dr. Marina Goodell or partners and further  recommendations pending their evaluation.      Jordan Hawks Elnoria Howard, MD  Electronically Signed     PDH/MEDQ   D:  12/04/2006  T:  12/04/2006  Job:  [id number]   cc:   Wilhemina Bonito. Marina Goodell, MD  Redge Gainer. Perini, M.D.

## 2010-12-18 NOTE — Discharge Summary (Signed)
NAMEARRIETTY, DERCOLE                 ACCOUNT NO.:  [id number]   MEDICAL RECORD NO.:  [id number]          PATIENT TYPE:  OBV   LOCATION:  1313                         [facility]ACILITY:  [facility]   PHYSICIAN:  Mark A. Perini, M.D.   DATE OF BIRTH:  August 08, 1955   DATE OF ADMISSION:  12/03/2007  DATE OF DISCHARGE:  12/05/2007                               DISCHARGE SUMMARY   DISCHARGE DIAGNOSES:  1. Asthma and chronic obstructive pulmonary disease with chronic      obstructive pulmonary disease acute exacerbation.  2. Hyperglycemia due to prednisone treatment.  3. Leukocytosis.  4. Hypotension upon admission, which improved with intravenous fluids.  5. Anxiety and depression, chronic.  6. Gastroesophageal reflux disease.   DISCHARGE MEDICATIONS:  1. Vicodin 5/500 one to two tablets every 6 hours as needed for pain.  2. Multivitamin one daily.  3. B12 500 mcg daily.  4. Xanax 0.5 mg one up to three times daily as needed.  5. Phenergan 12.5-25 mg up to twice a day as needed.  6. Isosorbide 15 mg daily.  7. Reglan 5 mg three times a day before meals.  8. Nexium 40 mg twice daily.  9. She is to stop her Diovan.  10.She is to resume aspirin 81 mg daily.  11.Synthroid 150 mcg daily except no pill one day each week.  12.Wellbutrin XL 150 mg daily.  13.Elavil 100 mg each evening.  14.Symbicort 160/4.5 mcg two puffs twice daily.  15.Albuterol nebulized up to four times daily as needed.  16.Augmentin 875 mg twice daily for four further days.  17.Prednisone taper has been given.  18.She may also use Tussionex 5 mL q.12 h. as needed for severe      coughing.   HISTORY OF PRESENT ILLNESS:  Leon is a 55 year old female with a history  of past tobacco abuse and COPD/asthma.  She came to [facility]  emergency room because of persistent cough and headache.  She felt she  might have had some elevated temperature.  She has felt warm.  In the  emergency room she was given Rocephin and azithromycin and a liter  of  fluid.  Her blood pressure remained low and she remained significantly  bronchospastic on exam and therefore was admitted.   HOSPITAL COURSE:  Ridley was admitted to a regular bed.  She was given  steroids, antibiotics, oxygen and nebulizer treatments as well as  covered with antibiotics, Rocephin and azithromycin.  Her blood pressure  improved.  She remained significantly tight with her breathing with  decreased breath sounds bilaterally and wheezes bilaterally on  inspiration.  However, by Dec 05, 2007, she improved to the point of  being able to go home to continue her therapy as an outpatient.  On Dec 05, 2007, she was so discharged home.   DISCHARGE PHYSICAL EXAM:  Temperature 98.2, pulse 90, blood pressure  112/57, respiratory rate 18.  Blood sugar 121 and 141.  Saturation of  94% on 2 L, 98% saturation on room air.  She was in no acute distress.  She did have wheezing bilaterally on  exam but she was moving air fairly  well.  HEART:  Regular rate and rhythm with no murmur, rub or gallop.  ABDOMEN:  Soft, nontender, nondistended, with no mass.  There was no edema.   DISCHARGE LABORATORY DATA:  White count 13.7 with 61% segs, 32%  lymphocytes, 5% monocytes.  Hemoglobin 12.9, platelet count 252,000.  Sodium 143, potassium 4.1, chloride 106, CO2 32, BUN 6, creatinine 0.90,  glucose 107.  LFTs normal except an albumin of 3.1.  A1c was 5.5.  TSH  was 0.078.  B12 was 1300.   DISCHARGE INSTRUCTIONS:  Asta is to follow a low-salt diet.  She is to  call if she has any recurrent problems.  She is to follow up with Dr.  Waynard Edwards in 1-2 weeks.  She is to follow up with Dr. Reyes Ivan per his  orders.  She is to follow up with Dr. Shelle Iron per his recommendation.      Mark A. Perini, M.D.  Electronically Signed     MAP/MEDQ  D:  12/15/2007  T:  12/15/2007  Job:  [id number]   cc:   Elmore Guise., M.D.  Fax: [phone]   Barbaraann Share, MD,FCCP  [address]  [place]  [place]  [address]

## 2010-12-18 NOTE — H&P (Signed)
[name], SAIA NO.:  [id number]   MEDICAL RECORD NO.:  [id number]          PATIENT TYPE:  INP   LOCATION:  2021                         FACILITY:  [facility]   PHYSICIAN:  Larina Earthly, M.D.        DATE OF BIRTH:  30-Dec-1955   DATE OF ADMISSION:  12/03/2006  DATE OF DISCHARGE:                              HISTORY & PHYSICAL   CHIEF COMPLAINT:  Chest pain with palpitations status post p.o. intake  unrelieved with sublingual nitroglycerin.   HISTORY OF THE PRESENT ILLNESS:  This is a 55 year old Caucasian female  with a past medical history significant for anxiety and depression  disorder, chronic asthma, COPD, hypothyroidism, atypical chest pain,  dyspnea on exertion.  She also has a history of a cardiac  catheterization in January 2008 revealing a catheter-induced vasospasm  of the proximal RCA with otherwise normal coronary arteries and normal  ejection fraction with borderline pulmonary hypertension.  At the time,  the patient was placed on Diovan at low dose and long-acting nitrates.  The patient now also reports a several week history of increasing  epigastric bloating pain, nausea and vomiting despite using 1-3  Nexium  each day.  Given the increasing frequency of her proton pump inhibitor  use, the patient was scheduled to see Dr. Marina Goodell for an EGD on Dec 16, 2006 per the patient's report.  Today, the patient ate a small portion  of food and this was followed 10 minutes later by 10/10 chest pain that  was located over the left chest, anterior chest wall, radiating to the  back without association of shortness of breath but positive association  with nausea and significant palpitations.  The patient did take three  sublingual nitroglycerin tablets without relief.  EMS was called by her  boyfriend and she was transported to the emergency room.  In the  emergency room, she was given morphine, Toradol, and Zofran without  relief and nitrate drip was started   followed by additional doses of  Tylenol, Zofran, and morphine with relief of her chest pain but  resulting in a headache.  EKG and cardiac enzymes closely spaced  together x2 were negative.  Chest x-ray was unremarkable.  D-dimer is  pending.  The patient was not hypoxic.  The patient is currently chest  pain-free but on a nitrate drip.  Dr. Amil Amen of Cardiology was called  however given the fact that her cardiac catheterization was unremarkable  in January 2008, he deferred admission.  Given the need to rule out the  patient for myocardial infarction in the setting of possible vasospasms  and the need to further work up her GI etiologies for possible atypical  chest pain, the patient was subsequently admitted.   REVIEW OF SYSTEMS:  Negative for fevers, chills, new focal neurological  or musculoskeletal deficits.  Negative for change in bowel habits.  Negative for urinary symptoms.  Negative for worsening anxiety or  depression.  The patient states that she has not smoked but states that  over the last several weeks she has limited her caloric  intake secondary  to an atypical chest pain and epigastric discomfort especially that  associated with bloating.  She denies any blood per rectum or in her  urine.  She states that when she does vomit, there is no blood in her  emesis.   PROBLEM LIST:  1. Anxiety and depression disorder.  2. Chronic asthma.  3. COPD.  4. Hypothyroidism.  5. A history of atypical chest pain.  6. Dyspnea and dyspnea on exertion.  7. Hypothyroidism.  8. Presumed gastroesophageal reflux disease.  9. B12 deficiency based on medications list.   SOCIAL HISTORY:  The patient is divorced for at least a decade but does  have a significant boyfriend.  She is disabled and does have a history  of a significant tobacco abuse but has been off of tobacco products for  at least several months.  She has no alcohol or drug use history.   FAMILY HISTORY:  Significant for  brain tumors, coronary artery disease,  congestive heart failure.   LABORATORY EVALUATION:  Cardiac catheterization August 16, 2006 by Dr.  Lady Deutscher reveals normal coronary arteries with mild luminal  irregularities; vasospasm of the proximal right coronary artery that was  catheter-induced with total resolution by the end of the case with  intracoronary nitroglycerin, normal ejection fraction at 55-60%,  borderline high pulmonary artery pressures consistent with pulmonary  hypertension.   PHYSICAL EXAMINATION:  GENERAL:  We have an obese but pleasant Caucasian  female sitting on the edge of the bed in no apparent distress, answering  all questions appropriately.  VITAL SIGNS:  Blood pressure 109/64, respirations 22, with an oxygen  saturation of 94-95% on room air, heart rate was 97, temperature is  afebrile.  HEENT:  Sclerae are anicteric.  Extraocular movements are intact.  There  is no oropharyngeal lesions.  NECK:  Supple.  There is no cervical lymphadenopathy.  There is no  carotid bruits.  LUNGS:  Clear to auscultation bilaterally with some coarse breath  sounds.  CARDIOVASCULAR:  Exam reveals a regular rate and rhythm without  murmurs, rubs, or gallops appreciated.  ABDOMEN:  Abdominal exam reveals a soft, nondistended abdomen.  There is  mild tenderness of the epigastric area with no rebound; bowel sounds are  present.  EXTREMITY:  Exam reveals trace edema.  Pedal pulses are intact.  NEUROLOGICAL:  Exam is grossly nonfocal.   LABORATORY EVALUATION:  EKG reveals a normal sinus rhythm with rightward  axis unchanged from previous EKGs.  Chest x-ray reveals no acute disease  pattern; two sets of cardiac enzymes are negative spaced with time  between the 2 sets being 2 hours.  Otherwise, labs revealed white blood  cell count 11.6, hemoglobin 14.6, hematocrit 42.7, platelet count 297, D- dimer pending, PT time 12.9 seconds normal.  Sodium 137, potassium 3.8,  BUN 8,  creatinine 0.8.   ASSESSMENT/PLAN:  1. Atypical chest pain with normal coronary arteries by cardiac      catheterization in January, 2008 but currently only chest pain-free      after morphine and nitrate intravenously.  Cardiac enzymes closely      spaced x2 were normal.  Chest x-ray is unremarkable.  D-dimer is      pending.  Our plan at this time will be to wean the nitrate drip.      Provide proton pump inhibitors twice daily and monitor a full set      of cardiac enzymes with a.m. electrocardiogram.  If any enzymes are  positive, we will obtain cardiac consultation or if there are      electrocardiogram changes we will do the same.  We will also      consult Gastrointestinal for possible endoscopy and/or upper      gastrointestinal within the next 24 hours for their evaluation.  2. Nausea, vomiting, with epigastric pain, again possibly related to  #1.  We will continue proton pump inhibitors and question the use of  Reglan.  1. Anxiety-depression disorder.  We will continue Wellbutrin, Xanax,      and Elavil and we will also check a      thyroid-stimulating hormone given history of hypothyroidism.  2. We will maintain pain management with morphine for right now and      provide deep venous thrombosis prophylaxis.      Larina Earthly, M.D.  Electronically Signed     RA/MEDQ  D:  12/03/2006  T:  12/04/2006  Job:  [id number]   cc:   Elmore Guise., M.D.  Wilhemina Bonito. Marina Goodell, MD  Redge Gainer. Perini, M.D.

## 2010-12-18 NOTE — Op Note (Signed)
NAME:  [name], [name]                           ACCOUNT NO.:  [id number]   MEDICAL RECORD NO.:  [id number]                   PATIENT TYPE:  INP   LOCATION:  5705                                 FACILITY:  [facility]   PHYSICIAN:  Velora Heckler, M.D.                DATE OF BIRTH:  03/01/1956   DATE OF PROCEDURE:  10/27/2003  DATE OF DISCHARGE:                                 OPERATIVE REPORT   PREOPERATIVE DIAGNOSIS:  Acute cholecystitis, cholelithiasis, rule out  choledocholithiasis.   POSTOPERATIVE DIAGNOSIS:  Acute cholecystitis, cholelithiasis, rule out  choledocholithiasis.   OPERATION PERFORMED:  Laparoscopic cholecystectomy with intraoperative  cholangiogram.   SURGEON:  Velora Heckler, M.D.   ANESTHESIA:  General.   ESTIMATED BLOOD LOSS:  50 mL.   PREPARATION:  Betadine.   COMPLICATIONS:  None.   INDICATIONS FOR PROCEDURE:  The patient is a 55 year old white female  presented to the emergency department with sudden onset of abdominal pain,  nausea and vomiting.  Ultrasound demonstrated a thick-walled gallbladder  containing stones.  Liver function tests were mildly elevated.  The patient  now comes to surgery for persistent biliary colic and cholelithiasis.   DESCRIPTION OF PROCEDURE:  The procedure was done in operating room #16  at  the [facility]. [facility].  The patient was brought to the  operating room and placed in supine position on the operating room table.  Following administration of general anesthesia, the patient was prepped and  draped in the usual strict aseptic fashion.  After ascertaining that an  adequate level of anesthesia had been obtained, an infraumbilical incision  was made with a #15 blade.  Dissection was carried down through the  subcutaneous tissues.  Fascia was incised in the midline and the peritoneal  cavity was entered cautiously.  A 0 Vicryl pursestring suture was placed in  the fascia.  A Hasson cannula was introduced and  secured with a pursestring  suture.  The abdomen was insufflated with carbon dioxide.  The laparoscope  was introduced and the abdomen explored.  operative ports were placed along  the right costal margin in the midline, midclavicular line, and anterior  axillary line.  The fundus of the gallbladder was markedly distended. It was  punctured with an aspirating trocar and bile was removed from the  gallbladder.  The gallbladder was then grasped and retracted cephalad.  There were adhesions to the neck of the gallbladder.  These were carefully  taken down exposing the neck of gallbladder and junction with the cystic  duct.  This was inflamed, edematous and quite friable.  The cystic duct was  dissected utilized and a clip was placed at the neck of the gallbladder.  The cystic duct was incised.  A small amount of mucoid material emanated  from the cystic duct.  Cook cholangiography catheter was introduced through  a stab wound in the right  upper quadrant and placed into the cystic duct.  It was secured with a Ligaclip; however, upon irrigation, saline does not  flow into the biliary tree but refluxed around the clip and catheter.  The  clip was removed and the catheter was replaced and clipped again.  Again  saline would not irrigate into the biliary tract.  Therefore, the slip and  catheter were again withdrawn and the cystic duct was explored.  It was  irrigated.  It was manipulated with the [facility].  On delivering a  less than 1 cm stone out of the cystic duct, there was clear flow of bile.  Stone was retrieved and passed off the field.  The [facility] catheter was  reinserted into the cystic duct and secured with a Ligaclip.  This time the  biliary tract irrigated easily without leakage.  C-arm fluoroscopy was then  brought on to the field.  Using C-arm fluoroscopy, an operative  cholangiogram was performed.  There were no filling defects.  There was  reflux of contrast into the right  and left hepatic ductal systems.  There  was no biliary dilatation.  There was free flow distally into the duodenum.  Representative photographs were made for the medical record.  Next, the clip  was withdrawn.  The [facility] catheter was removed from the peritoneal cavity.  The cystic duct was triply clipped and divided.  The cystic artery was  dissected out, doubly clipped and divided.  The posterior branch of the  cystic artery was doubly clipped and divided.  The gallbladder was excised  from the gallbladder bed using a hook electrocautery.  This was difficult  due to edema.  The gallbladder was completely excised and placed into an  EndoCatch bag.  It was then withdrawn through the umbilical port.  It  contained numerous gallstones.  The right upper quadrant was irrigated with  warm saline which was evacuated.  Good hemostasis was noted.  Fluid was  removed from the peritoneal cavity.  Ports were removed and pneumoperitoneum  was released.  Port sites showed good hemostasis.  The 0 Vicryl pursestring  suture was tied securely.  Skin edges were cauterized with the  electrocautery to achieve hemostasis.  All wounds were anesthetized with  local anesthetic with epinephrine.  All wounds were closed with interrupted  4-0  Vicryl subcuticular sutures.  The wounds were washed and dried and  benzoin and Steri-Strips were applied.  Sterile dressings were applied.  The  patient was awakened from anesthesia and brought to the recovery room in  stable condition.  The patient tolerated the procedure well.                                               Velora Heckler, M.D.    TMG/MEDQ  D:  10/27/2003  T:  10/28/2003  Job:  [id number]   cc:   Loraine Leriche A. Waynard Edwards, M.D.  [address]  [place]  [place] [address]  Fax: [phone]   Wilhemina Bonito. Marina Goodell, M.D. [facility]   Doug Sou, M.D.  [address]  [place] [address]  Fax: [phone]

## 2010-12-18 NOTE — Discharge Summary (Signed)
[name], [name]                 ACCOUNT NO.:  [id number]   MEDICAL RECORD NO.:  [id number]          PATIENT TYPE:  INP   LOCATION:  4707                         FACILITY:  [facility]   PHYSICIAN:  Mark A. Perini, M.D.   DATE OF BIRTH:  02-Dec-1955   DATE OF ADMISSION:  07/14/2006  DATE OF DISCHARGE:  07/15/2006                               DISCHARGE SUMMARY   DISCHARGE DIAGNOSES:  1. Anxiety and depression.  2. Chronic asthma.  3. Chronic obstructive pulmonary disease.  4. Hypothyroidism.  5. Atypical chest pain.  6. Dyspnea and dyspnea on exertion.   PROCEDURES:  Cardiology consultation and 2-D echocardiogram.  This was  performed on July 15, 2006 and showed overall left ventricular  systolic function to be normal with an ejection fraction estimated at 55-  60%.  There were no left ventricular regional wall motion abnormalities  identified.  She did have some abnormal left ventricular relaxation.  The study was inadequate to rule out the possibility of bicuspid aortic  valve.  Right ventricle was normal size and function.   DISCHARGE MEDICATIONS:  [name] is to resume her previous medications which  include:  1. Advair 250/50 one puff twice daily.  2. Alprazolam 0.5 mg t.i.d. to q.i.d.  3. Amitriptyline 100 mg q.h.s.  4. Levothyroxine 200 mcg daily.  5. Nexium 40 mg daily.  6. Phenergan 25 mg as needed.  7. Wellbutrin SR 150 mg daily.  8. DuoNeb nebulizers 3 times a day.  9. Aspirin 81 mg daily.  10.Multivitamin daily.  11.B12 oral supplement daily.  12.She was also placed on low-dose Diovan upon discharge per Dr.      Reyes Ivan which I believe is just 80 mg daily.   HISTORY OF PRESENT ILLNESS:  [name] is a 55 year old female who presented  to the emergency room with shortness of breath.  She had some atypical  chest pain symptoms as well.  She did have some cardiac risk factors and  therefore was admitted for further evaluation.   HOSPITAL COURSE:  [name] was admitted to a  telemetry bed.  She remained  hemodynamically stable during her entire stay.  She had no evidence of  myocardial infarction by serial cardiac enzymes and EKGs.  Cardiology  was consulted, and with the results of the 2-D echocardiogram, she was  deemed stable for discharge home to be followed up in the office in the  near future.   DISCHARGE PHYSICAL EXAMINATION:  Temperature 97.7, pulse 98, respiratory  20, blood pressure 102/69, 94% saturation on 2 liters of oxygen. Heart  was normal sinus rhythm with a rate of 89 beats a minute.  She was in no  acute distress.  She had coarse breath sounds bilaterally but no wheezes, rales or  rhonchi. She did have good air movement bilaterally on lung exam.  HEART:  Was regular rate and rhythm with no murmur, rub or gallop.  ABDOMEN:  Soft and nontender.  There was no edema.   NOTABLE LABORATORY DATA:  Chest x-ray showed minimal streaky areas of  atelectasis but no infiltrates, edema or effusion.  EKG showed sinus  tachycardia with a rightward axis but otherwise no significant findings.  White count was 12.6, hemoglobin 16.0, platelet count 355.  There are  68% segs, 24% lymphocytes, 4% monocytes.  Coagulation studies were  normal.  Liver tests were normal with  the exception of an AST of 38 and  an ALT of 44 which were slightly elevated.  Hemoglobin A1c was 6.1%.  CK  was 27, CK-MB 0.9, troponin I 0.01.  BNP was less than 30.  Lipid  profile showed a total cholesterol of 184, triglycerides 312, HDL 36,  LDL of 86.   DISCHARGE INSTRUCTIONS:  [name] is to follow a low-salt, low-fat diet.  She did have a slightly elevated hemoglobin A1c and will need fasting  sugars rechecked in the next several months.  Furthermore, she does have  a dyslipidemia and likely need treatment for this with medication.  She  is to follow-up with Dr. Waynard Edwards in the next 3-4 weeks, and she is to  follow up Dr. Reyes Ivan in 1 week.           ______________________________   Redge Gainer. Waynard Edwards, M.D.     MAP/MEDQ  D:  08/09/2006  T:  08/09/2006  Job:  [id number]   cc:   Elmore Guise., M.D.

## 2010-12-18 NOTE — H&P (Signed)
NAME:  [name], [name]                           ACCOUNT NO.:  [id number]   MEDICAL RECORD NO.:  [id number]                   PATIENT TYPE:  INP   LOCATION:  5705                                 FACILITY:  [facility]   PHYSICIAN:  Velora Heckler, M.D.                DATE OF BIRTH:  1956-05-31   DATE OF ADMISSION:  10/27/2003  DATE OF DISCHARGE:                                HISTORY & PHYSICAL   PRIMARY CARE PHYSICIAN:  Mark A. Waynard Edwards, M.D.   GASTROENTEROLOGY:  Wilhemina Bonito. Marina Goodell, M.D. [facility]   REASON FOR ADMISSION:  Abdominal pain, cholelithiasis, subacute  cholecystitis.   HISTORY OF PRESENT ILLNESS:  The patient is a 55 year old white female from  [place], [place], who presents by EMS to the emergency  department with sudden onset abdominal pain, nausea, and vomiting. The  patient notes a colonoscopy on October 21, 2003, by Dr. Yancey Flemings. Several  polyps were removed. By her report, these were negative for malignancy.  However, the patient felt ill and began having upper respiratory symptoms.  She was placed on a Z-Pak which she completed yesterday. The patient was  awakened from sleep today approximately 3 a.m. with upper abdominal pain.  This was followed by onset of nausea and vomiting. She then called EMS and  was transported to the emergency department  from [place]. The  patient was evaluated by Dr. Felix Pacini and the emergency room staff.  Abdominal x-rays showed no evidence of free air. Ultrasound was obtained  which demonstrates cholelithiasis with gallbladder wall thickening.  The  common bile duct was normal. The pancreas was not well seen. Laboratory  studies showed a white count of 10.4 with slight left shift with 83%  neutrophils. Liver function tests showed minimal elevation of SGOT of 44,  SGPT 44, and a normal alkaline phosphatase of 86. Total bilirubin normal at  0.4.  Lipase normal at 23.  General surgery is now consulted for evaluation  for  unrelenting abdominal pain secondary to gallstones.   PAST MEDICAL HISTORY:  Status post abdominal hysterectomy in 1985, history  of hypothyroidism, history of anxiety disorder, and history of depression.   MEDICATIONS:  1. Levoxyl 175 mcg daily.  2. Xanax 0.5 mg b.i.d.  3. Elavil 100 mg q.h.s.  4. Lexapro daily.   ALLERGIES:  None known.   SOCIAL HISTORY:  The patient lives with her boyfriend. She has no children.  She smokes cigarettes. She denies alcohol use. 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   REVIEW OF SYSTEMS:  A 15-system review without significant other positives.  The patient denies any history of jaundice or acholic stools. She denies  fevers or chills. She denies any previous hepatobiliary disorders. She  denies any history of pancreatitis.   FAMILY HISTORY:  Noncontributory.   PHYSICAL EXAMINATION:  GENERAL: A 55 year old moderately obese white female  in no  acute distress in the emergency department, room 31.  VITAL SIGNS: Temperature 97.8, pulse 68, respirations 22, blood pressure  152/86, oxygen saturation 98% on room air.  HEENT: Normocephalic and atraumatic. Sclerae are clear. Pupils are 2 mm  bilaterally and reactive.  Conjunctiva are clear. Dentition shows full upper  and lower plates in good repair.  Mucous membranes are moist.  NECK: Supple without mass. Thyroid is normal without nodularity. There is no  goiter. There is no lymphadenopathy anteriorly or posteriorly. There are no  supraclavicular masses.  LUNGS: Clear to auscultation without rales, rhonchi, or wheezes.  CARDIAC: Regular rate and rhythm without murmur. Peripheral pulses are full.  ABDOMEN: Obese. There are no bowel sounds on auscultation. There is mild  tenderness to palpitation in the epigastrium and right upper quadrant. There  is no guarding, rebound, and no palpable mass. There is a well healed  Pfannenstiel incision.  EXTREMITIES: Nontender without edema.   NEUROLOGIC: The patient is alert and oriented without focal deficit.   LABORATORY STUDIES:  White count 10.4, hemoglobin 15.9, platelet count  288,000. Differential shows 83% neutrophils, 14% lymphocytes. Chemistry  profile shows normal electrolytes. Creatinine is normal at 0.9. SGOT  elevated at 42, SGPT elevated at 44, alkaline phosphatase normal at 85,  total bilirubin normal at 0.4, lipase normal at 23. Urinalysis shows small  hemoglobin, small ketones, and small urobilinogen.   EKG pending.   Radiographic studies as noted above.   IMPRESSION:  1. Subacute cholecystitis.  2. Cholelithiasis.  3. Abdominal pain.   PLAN:  The patient essentially has unrelenting biliary colic. Despite heavy  doses of narcotics in the emergency department  she remains symptomatic.  Therefore, we will proceed on an urgent basis with cholecystectomy. I have  explained to her at length the indications for surgery. I explained to her  laparoscopic cholecystectomy and the possibility of conversion to an open  procedure. We have discussed the hospital stay to be expected. She  understands and wishes to proceed.  1. Admit to [facility].  2. Initiation of intravenous antibiotics.  3. To operating room for cholecystectomy.  4. Routine postoperative care.                                                Velora Heckler, M.D.    TMG/MEDQ  D:  10/27/2003  T:  10/28/2003  Job:  [id number]   cc:   Loraine Leriche A. Waynard Edwards, M.D.  [address]  [place]  [place] [address]  Fax: [phone]   Wilhemina Bonito. Marina Goodell, M.D. [facility]

## 2010-12-18 NOTE — H&P (Signed)
[name], [name]                 ACCOUNT NO.:  [id number]   MEDICAL RECORD NO.:  [id number]          PATIENT TYPE:  INP   LOCATION:  4707                         FACILITY:  [facility]   PHYSICIAN:  Mark A. Perini, M.D.   DATE OF BIRTH:  [birth year]   DATE OF ADMISSION:  07/14/2006  DATE OF DISCHARGE:                              HISTORY & PHYSICAL   CHIEF COMPLAINT:  Shortness of breath and chest pain.   HISTORY OF PRESENT ILLNESS:  [name] is a 55 year old female with past  history as listed below.  She presents to the emergency room with  shortness of breath.  She also reports some intermittent vague chest  pains.  In the emergency room her workup is not particularly  revealing  except for the fact that she has mild sinus tachycardia.  She has an  elevated white count but this has been a chronic finding over the last  several months in the office.  She has excessive concern regarding  underlying disease or coronary disease.  Therefore I will admit her for  further evaluation.   PAST MEDICAL HISTORY:  1. Hypothyroidism.  2. Hysterectomy without oophorectomy in 1985.  3. Tonsillectomy.  4. Adenoidectomy.  5. D&C in 1984.  6. Depression and significant anxiety.  7. G0 parity status.  8. Right shoulder rotator cuff repair in 2001.  9. Status post cholecystectomy in 2005.  10.Status post colon polyps in 2005.   ALLERGIES:  No known allergies.   MEDICATIONS:  1. Advair 250/50 1 puff twice daily.  2. Alprazolam 0.5 mg t.i.d. to q.i.d.  3. Amitriptyline 100 mg q.h.s.  4. Levothyroxine 200 mcg daily.  5. Nexium 40 mg daily.  6. Phenergan 25 mg as needed.  7. Wellbutrin XL 150 mg daily.  8. Duo Nebs 3 x day.  9. Aspirin 81 mg daily started this week.  10.Multivitamin daily, B12 supplemental orally daily.   SOCIAL HISTORY:  She is divorced  since 31. Her boyfriend is with her.  She has a very significant tobacco history but states she has quit over  the last several months.  No  alcohol or drug use history.  She is  disabled but has not received disability benefits yet although I feel  that she should at some point.  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   FAMILY HISTORY:  Father died of a motor vehicle accident and he did have  diabetes.  Mother died of brain tumors in her 75s.  There is some family  history of coronary disease and congestive heart failure.   REVIEW OF SYSTEMS:  Specific for a lot of shortness of breath.  [name]  calls my office essentially every day with various questions and  concerns. She often has shortness of breath.  She often request  prednisone. The last few times I have seen her in the office her  breathing has not been sufficiently bad to require prednisone.  Due to  her persistent shortness of breath complains, I  recently did a CT scan  of her chest with angiogram  showing no pulmonary embolism.  She did have  some apical scarring which will require follow-up CT in a few months.  She is also sent to cardiology and is due to have an echocardiogram next  week.  She reports fevers and chills and states that she measures her  temperatures and sometimes it does go up over 100.  She has significant  shortness of breath daily.  She has weakness in her legs.  She has  headaches and nausea but this has been a chronic complaint over many  years.  She states she had a light pain in her chest today and she had a  more significant pain in her chest a few weeks ago.  She denies any  blood from above or below.  She has had a slight bit of ankle edema this  week.  She does report some weight gain.   PHYSICAL EXAMINATION:  VITALS:  Temperature 98.8, blood pressure 146/89,  pulse 120 now 109, respiratory 22, 99% saturation on room air.  She is  in no acute distress, she is semi supine.  There is no JVD.  No icterus,  no pallor.  She has bronchial breath sounds bilaterally with a few  scattered wheezes but no rhonchi or rales.  HEART:   Regular rate and rhythm with no significant murmur, rub  or  gallop.  She is slightly tachycardiac.  ABDOMEN:  Soft, nontender, nondistended with no masses or  hepatosplenomegaly.  She is obese.  EXTREMITIES:  There is trace nonpitting ankle and foot edema  bilaterally.  She moves extremities x4 and is neurologically intact.   LABORATORY DATA:  Chest x-ray shows streaky bibasilar atelectasis but is  otherwise negative.  EKG shows sinus tachycardia with right axis  otherwise normal 110 beats per minute. White count is 12.6 with 68%  segs, 24% lymphocytes, 4% monocytes.  Hemoglobin 16.0, platelet count  355,000, INR 1.0, D-dimer less than 0.22.  Sodium 137, potassium 3.7,  chloride 102, CO2 24, BUN 7, creatinine 0.8, glucose 96, calcium 9.7,  total protein 7.3, albumin 3.8, AST 38, ALT 49, alk phos 112, total bili  0.6, lipase 22, MB less than 1.0, troponin I is less than 0.05.  Myoglobin is 58.3.   ASSESSMENT/PLAN:  A 55 year old female with hypothyroidism, severe  anxiety, depression, and chronic asthma and possible COPD.  She does  have shortness of breath and some atypical chest pain.  She does have  some cardiac risk factors including family history and prolonged heavy  tobacco use.  We will admit.  We will continue her asthma treatment.  Will check a 2-D echocardiogram.  We will rule out for myocardial  infarction with serial enzymes and electrocardiograms.  We will ask  cardiology to see if she wants any further workup.  She is a full code  status.           ______________________________  Redge Gainer Waynard Edwards, M.D.     MAP/MEDQ  D:  07/14/2006  T:  07/15/2006  Job:  [id number]   cc:   Elmore Guise., M.D.

## 2011-04-14 ENCOUNTER — Emergency Department (HOSPITAL_COMMUNITY): Payer: Medicaid Other

## 2011-04-14 ENCOUNTER — Emergency Department (HOSPITAL_COMMUNITY)
Admission: EM | Admit: 2011-04-14 | Discharge: 2011-04-14 | Disposition: A | Discharge status: Home / Self Care | Payer: Medicaid Other | Attending: Emergency Medicine | Admitting: Emergency Medicine

## 2011-04-14 DIAGNOSIS — R10813 Right lower quadrant abdominal tenderness: Secondary | ICD-10-CM | POA: Insufficient documentation

## 2011-04-14 DIAGNOSIS — R110 Nausea: Secondary | ICD-10-CM | POA: Insufficient documentation

## 2011-04-14 DIAGNOSIS — R109 Unspecified abdominal pain: Secondary | ICD-10-CM | POA: Insufficient documentation

## 2011-04-14 LAB — COMPREHENSIVE METABOLIC PANEL
Alkaline Phosphatase: 82 U/L (ref 39–117)
BUN: 16 mg/dL (ref 6–23)
Calcium: 9.2 mg/dL (ref 8.4–10.5)
Creatinine, Ser: 0.95 mg/dL (ref 0.50–1.10)
GFR calc Af Amer: >60 mL/min (ref >60)
Glucose, Bld: 74 mg/dL (ref 70–99)
Potassium: 3.9 mEq/L (ref 3.5–5.1)
Total Protein: 7.2 g/dL (ref 6.0–8.3)

## 2011-04-14 LAB — URINALYSIS, ROUTINE W REFLEX MICROSCOPIC
Ketones, ur: NEGATIVE mg/dL
Leukocytes, UA: NEGATIVE
Nitrite: NEGATIVE
pH: 5.5 (ref 5.0–8.0)

## 2011-04-14 LAB — CBC
HCT: 47.2 % — ABNORMAL HIGH (ref 36.0–46.0)
Hemoglobin: 16 g/dL — ABNORMAL HIGH (ref 12.0–15.0)
MCH: 35.2 pg — ABNORMAL HIGH (ref 26.0–34.0)
MCHC: 33.9 g/dL (ref 30.0–36.0)
MCV: 103.7 fL — ABNORMAL HIGH (ref 78.0–100.0)

## 2011-04-14 LAB — DIFFERENTIAL
Lymphocytes Relative: 23 % (ref 12–46)
Lymphs Abs: 2.4 10*3/uL (ref 0.7–4.0)
Monocytes Absolute: 0.4 10*3/uL (ref 0.1–1.0)
Monocytes Relative: 4 % (ref 3–12)
Neutro Abs: 7.6 10*3/uL (ref 1.7–7.7)

## 2011-05-07 LAB — TRICYCLIC ANTIDEPRESSANT EVALUATION
Amitrip+Nortrip: 86 mcg/L — ABNORMAL LOW (ref 120–250)
Amitriptyline: 53 mcg/L

## 2011-05-07 LAB — COMPREHENSIVE METABOLIC PANEL
ALT: 20 U/L (ref 0–35)
AST: 21 U/L (ref 0–37)
Albumin: 3.4 g/dL — ABNORMAL LOW (ref 3.5–5.2)
CO2: 18 mEq/L — ABNORMAL LOW (ref 19–32)
Calcium: 8.7 mg/dL (ref 8.4–10.5)
GFR calc Af Amer: >60 mL/min (ref >60)
Sodium: 139 mEq/L (ref 135–145)
Total Protein: 6.6 g/dL (ref 6.0–8.3)

## 2011-05-07 LAB — DRUG SCREEN PANEL (SERUM)
Amphetamine Scrn: NEGATIVE
Barbiturate Scrn: NEGATIVE
Benzodiazepine Scrn: NEGATIVE
Cocaine (Metabolite): NEGATIVE

## 2011-05-07 LAB — CBC
MCHC: 34.3 g/dL (ref 30.0–36.0)
RBC: 3.35 MIL/uL — ABNORMAL LOW (ref 3.87–5.11)
RDW: 14.9 % (ref 11.5–15.5)

## 2011-05-07 LAB — DIFFERENTIAL
Eosinophils Absolute: 0.2 10*3/uL (ref 0.0–0.7)
Eosinophils Relative: 2 % (ref 0–5)
Lymphs Abs: 2.9 10*3/uL (ref 0.7–4.0)
Monocytes Absolute: 0.5 10*3/uL (ref 0.1–1.0)
Monocytes Relative: 4 % (ref 3–12)

## 2011-05-07 LAB — RAPID URINE DRUG SCREEN, HOSP PERFORMED
Amphetamines: NOT DETECTED
Cocaine: NOT DETECTED
Tetrahydrocannabinol: NOT DETECTED

## 2011-05-07 LAB — URINE CULTURE

## 2011-05-07 LAB — TSH: TSH: 2.262 u[IU]/mL (ref 0.350–4.500)

## 2011-05-07 LAB — RPR: RPR Ser Ql: NONREACTIVE

## 2011-05-11 LAB — DIFFERENTIAL
Basophils Absolute: 0
Basophils Relative: 0
Eosinophils Absolute: 0 — ABNORMAL LOW
Lymphs Abs: 2.3
Neutrophils Relative %: 81 — ABNORMAL HIGH

## 2011-05-11 LAB — BASIC METABOLIC PANEL
BUN: 11
Chloride: 103
Creatinine, Ser: 0.99

## 2011-05-11 LAB — CBC
MCV: 99.3
Platelets: 310
RDW: 13.9
WBC: 13.3 — ABNORMAL HIGH

## 2011-05-11 LAB — INFLUENZA A+B VIRUS AG-DIRECT(RAPID)

## 2011-05-11 LAB — D-DIMER, QUANTITATIVE: D-Dimer, Quant: <0.22

## 2011-05-13 LAB — BASIC METABOLIC PANEL
BUN: 4 — ABNORMAL LOW
CO2: 25
Calcium: 9.2
Creatinine, Ser: 0.8
GFR calc Af Amer: >60
Glucose, Bld: 117 — ABNORMAL HIGH

## 2011-05-13 LAB — DIFFERENTIAL
Basophils Absolute: 0.1
Basophils Relative: 1
Eosinophils Absolute: 0.2
Neutro Abs: 9.2 — ABNORMAL HIGH
Neutrophils Relative %: 77

## 2011-05-13 LAB — CBC
MCHC: 34.8
MCV: 97.3
Platelets: 286
RDW: 13.7

## 2011-05-17 LAB — I-STAT 8, (EC8 V) (CONVERTED LAB)
Acid-Base Excess: 1
BUN: 7
Chloride: 104
HCT: 49 — ABNORMAL HIGH
Potassium: 3.7
pCO2, Ven: 37.5 — ABNORMAL LOW
pH, Ven: 7.434 — ABNORMAL HIGH

## 2011-05-17 LAB — POCT CARDIAC MARKERS
CKMB, poc: <1 — ABNORMAL LOW
Myoglobin, poc: 65.8

## 2011-05-19 LAB — POCT CARDIAC MARKERS
CKMB, poc: <1 — ABNORMAL LOW
CKMB, poc: <1 — ABNORMAL LOW
Myoglobin, poc: 57.5
Myoglobin, poc: 65
Operator id: 151321
Operator id: 288331
Troponin i, poc: <0.05
Troponin i, poc: <0.05

## 2011-05-19 LAB — DIFFERENTIAL
Basophils Absolute: 0.1
Basophils Relative: 0
Eosinophils Absolute: 0.4
Eosinophils Relative: 3
Lymphocytes Relative: 21
Lymphs Abs: 2.8
Monocytes Absolute: 0.5
Monocytes Relative: 4
Neutro Abs: 9.8 — ABNORMAL HIGH
Neutrophils Relative %: 72

## 2011-05-19 LAB — BASIC METABOLIC PANEL
BUN: 9
Chloride: 105
Creatinine, Ser: 0.73
GFR calc Af Amer: >60
GFR calc non Af Amer: >60

## 2011-05-19 LAB — CBC
HCT: 43.1
Hemoglobin: 14.6
MCHC: 33.7
MCV: 97.3
Platelets: 327
RBC: 4.43
RDW: 14.4 — ABNORMAL HIGH
WBC: 13.6 — ABNORMAL HIGH

## 2011-05-19 LAB — B-NATRIURETIC PEPTIDE (CONVERTED LAB): Pro B Natriuretic peptide (BNP): <30

## 2011-05-19 LAB — BASIC METABOLIC PANEL WITH GFR
CO2: 24
Calcium: 9.5
Glucose, Bld: 111 — ABNORMAL HIGH
Potassium: 3.7
Sodium: 137

## 2011-05-19 LAB — D-DIMER, QUANTITATIVE: D-Dimer, Quant: <0.22

## 2013-11-10 ENCOUNTER — Encounter (HOSPITAL_COMMUNITY): Payer: Self-pay | Admitting: Emergency Medicine

## 2013-11-10 ENCOUNTER — Emergency Department (HOSPITAL_COMMUNITY)
Admission: EM | Admit: 2013-11-10 | Discharge: 2013-11-10 | Disposition: A | Discharge status: Home / Self Care | Payer: Medicaid Other | Attending: Emergency Medicine | Admitting: Emergency Medicine

## 2013-11-10 DIAGNOSIS — R110 Nausea: Secondary | ICD-10-CM | POA: Insufficient documentation

## 2013-11-10 DIAGNOSIS — J449 Chronic obstructive pulmonary disease, unspecified: Secondary | ICD-10-CM | POA: Insufficient documentation

## 2013-11-10 DIAGNOSIS — M7989 Other specified soft tissue disorders: Secondary | ICD-10-CM

## 2013-11-10 DIAGNOSIS — M79609 Pain in unspecified limb: Secondary | ICD-10-CM

## 2013-11-10 DIAGNOSIS — R209 Unspecified disturbances of skin sensation: Secondary | ICD-10-CM | POA: Insufficient documentation

## 2013-11-10 DIAGNOSIS — F17200 Nicotine dependence, unspecified, uncomplicated: Secondary | ICD-10-CM | POA: Insufficient documentation

## 2013-11-10 DIAGNOSIS — M109 Gout, unspecified: Secondary | ICD-10-CM | POA: Insufficient documentation

## 2013-11-10 DIAGNOSIS — Z8639 Personal history of other endocrine, nutritional and metabolic disease: Secondary | ICD-10-CM | POA: Insufficient documentation

## 2013-11-10 DIAGNOSIS — Z79899 Other long term (current) drug therapy: Secondary | ICD-10-CM | POA: Insufficient documentation

## 2013-11-10 DIAGNOSIS — J4489 Other specified chronic obstructive pulmonary disease: Secondary | ICD-10-CM | POA: Insufficient documentation

## 2013-11-10 DIAGNOSIS — R079 Chest pain, unspecified: Secondary | ICD-10-CM | POA: Insufficient documentation

## 2013-11-10 DIAGNOSIS — Z862 Personal history of diseases of the blood and blood-forming organs and certain disorders involving the immune mechanism: Secondary | ICD-10-CM | POA: Insufficient documentation

## 2013-11-10 DIAGNOSIS — M79606 Pain in leg, unspecified: Secondary | ICD-10-CM

## 2013-11-10 HISTORY — DX: Pure hypercholesterolemia, unspecified: E78.00

## 2013-11-10 HISTORY — DX: Chronic obstructive pulmonary disease, unspecified: J44.9

## 2013-11-10 HISTORY — DX: Gout, unspecified: M10.9

## 2013-11-10 LAB — BASIC METABOLIC PANEL
BUN: 14 mg/dL (ref 6–23)
CHLORIDE: 100 meq/L (ref 96–112)
CO2: 29 mEq/L (ref 19–32)
Calcium: 9.8 mg/dL (ref 8.4–10.5)
Creatinine, Ser: 1.15 mg/dL — ABNORMAL HIGH (ref 0.50–1.10)
GFR calc Af Amer: 60 mL/min — ABNORMAL LOW (ref >90)
GFR, EST NON AFRICAN AMERICAN: 52 mL/min — AB (ref >90)
Glucose, Bld: 83 mg/dL (ref 70–99)
Potassium: 4.1 mEq/L (ref 3.7–5.3)
Sodium: 141 mEq/L (ref 137–147)

## 2013-11-10 LAB — PROTIME-INR
INR: 0.98 (ref 0.00–1.49)
Prothrombin Time: 12.8 seconds (ref 11.6–15.2)

## 2013-11-10 LAB — CBC
HCT: 47.6 % — ABNORMAL HIGH (ref 36.0–46.0)
Hemoglobin: 16 g/dL — ABNORMAL HIGH (ref 12.0–15.0)
MCH: 34 pg (ref 26.0–34.0)
MCHC: 33.6 g/dL (ref 30.0–36.0)
MCV: 101.3 fL — ABNORMAL HIGH (ref 78.0–100.0)
PLATELETS: 264 10*3/uL (ref 150–400)
RBC: 4.7 MIL/uL (ref 3.87–5.11)
RDW: 13.9 % (ref 11.5–15.5)
WBC: 9.2 10*3/uL (ref 4.0–10.5)

## 2013-11-10 MED ORDER — HYDROCODONE-ACETAMINOPHEN 5-325 MG PO TABS
1.0000 | ORAL_TABLET | Freq: Once | ORAL | Status: AC
  Administered 2013-11-10: 1 via ORAL
  Filled 2013-11-10: qty 1

## 2013-11-10 MED ORDER — TRAMADOL HCL 50 MG PO TABS: 50.0000 mg | ORAL_TABLET | Freq: Four times a day (QID) | ORAL | Status: DC | PRN

## 2013-11-10 NOTE — ED Notes (Signed)
Pt c/o R leg pain for 4 weeks. Progressively worsening. Pain to back of leg and behind knee. Seen at Urgent care, told to come to ED to r/o blood clot.

## 2013-11-10 NOTE — ED Notes (Signed)
Pt then reports she has been having episodes of diaphoresis and nausea while the pain has been going on, happens when she has a sharp pain in her leg.

## 2013-11-10 NOTE — Progress Notes (Signed)
VASCULAR LAB PRELIMINARY  PRELIMINARY  PRELIMINARY  PRELIMINARY  Right lower extremity venous Doppler completed.    Preliminary report:  There is no DVT or SVT noted in the right lower extremity.  There is a large Baker's Cyst noted in the right popliteal fossa.  Kern AlbertaCandace R Margart Zemanek, RVT 11/10/2013, 4:44 PM

## 2013-11-10 NOTE — ED Notes (Signed)
Knapp, MD at bedside.  

## 2013-11-10 NOTE — ED Provider Notes (Signed)
CSN: [id number]     Arrival date & time 11/10/13  1423 History   First MD Initiated Contact with Patient 11/10/13 1501     Chief Complaint  Patient presents with  . Leg Pain    HPI Comments: Pt thought it was her gout.  She has been taking her gout medication but it has not helped.  She went to an urgent care today who sent her to the ED to evaluate for a blood clot.    The leg sometimes feels like hot water is flowing down it.   Patient is a 58 y.o. female presenting with leg pain. The history is provided by the patient.  Leg Pain Location:  Leg Leg location:  R leg Pain details:    Quality:  Aching and sharp   Severity:  Moderate   Onset quality:  Gradual   Duration:  4 weeks   Timing:  Constant Relieved by:  Nothing Worsened by:  Activity and bearing weight Ineffective treatments:  None tried Associated symptoms: numbness (right leg sometimes)   Associated symptoms: no back pain and no fever     Past Medical History  Diagnosis Date  . Gout   . COPD (chronic obstructive pulmonary disease)   . Hypercholesteremia    Past Surgical History  Procedure Laterality Date  . Abdominal hysterectomy    . Rotator cuff repair     No family history on file. History  Substance Use Topics  . Smoking status: Current Every Day Smoker  . Smokeless tobacco: Not on file  . Alcohol Use: No   OB History   Grav Para Term Preterm Abortions TAB SAB Ect Mult Living                 Review of Systems  Constitutional: Negative for fever.  Respiratory: Negative for shortness of breath.   Cardiovascular: Positive for chest pain (off and on for a few weeks, when she coughs, pt does smoke).  Gastrointestinal: Positive for nausea.  Genitourinary: Negative for dysuria.  Musculoskeletal: Negative for back pain.  Neurological: Positive for numbness (sometimes in her leg).  All other systems reviewed and are negative.     Allergies  Review of patient's allergies indicates no known  allergies.  Home Medications   Current Outpatient Rx  Name  Route  Sig  Dispense  Refill  . ALPRAZolam (XANAX) 0.5 MG tablet   Oral   Take 0.5 mg by mouth 3 (three) times daily as needed for anxiety.         Marland Kitchen buPROPion (WELLBUTRIN XL) 150 MG 24 hr tablet   Oral   Take 150 mg by mouth daily.         Marland Kitchen buPROPion (WELLBUTRIN) 100 MG tablet   Oral   Take 100 mg by mouth 2 (two) times daily.         . colchicine 0.6 MG tablet   Oral   Take 0.6 mg by mouth daily.         Marland Kitchen desvenlafaxine (PRISTIQ) 50 MG 24 hr tablet   Oral   Take 50 mg by mouth daily.         Marland Kitchen levothyroxine (SYNTHROID, LEVOTHROID) 100 MCG tablet   Oral   Take 100 mcg by mouth daily before breakfast.         . pantoprazole (PROTONIX) 40 MG tablet   Oral   Take 80 mg by mouth daily.         . traMADol (ULTRAM) 50  MG tablet   Oral   Take 1 tablet (50 mg total) by mouth every 6 (six) hours as needed.   15 tablet   0    BP 129/56  Pulse 74  Temp(Src) 98.4 F (36.9 C) (Oral)  Resp 14  SpO2 98% Physical Exam  Nursing note and vitals reviewed. Constitutional: She appears well-developed and well-nourished. No distress.  HENT:  Head: Normocephalic and atraumatic.  Right Ear: External ear normal.  Left Ear: External ear normal.  Eyes: Conjunctivae are normal. Right eye exhibits no discharge. Left eye exhibits no discharge. No scleral icterus.  Neck: Neck supple. No tracheal deviation present.  Cardiovascular: Normal rate, regular rhythm and intact distal pulses.   Pulmonary/Chest: Effort normal and breath sounds normal. No stridor. No respiratory distress. She has no wheezes. She has no rales.  Abdominal: Soft. Bowel sounds are normal. She exhibits no distension. There is no tenderness. There is no rebound and no guarding.  Musculoskeletal: She exhibits edema and tenderness.  Mild Edema noted bilateral lower extremities below knees, no appreciable difference, no erythema, ttp right ankle and  calf, no increased warmth  Neurological: She is alert. She has normal strength. No cranial nerve deficit (no facial droop, extraocular movements intact, no slurred speech) or sensory deficit. She exhibits normal muscle tone. She displays no seizure activity. Coordination normal.  Skin: Skin is warm and dry. No rash noted. She is not diaphoretic.  Psychiatric: She has a normal mood and affect.    ED Course  Procedures (including critical care time) Labs Review Labs Reviewed  CBC - Abnormal; Notable for the following:    Hemoglobin 16.0 (*)    HCT 47.6 (*)    MCV 101.3 (*)    All other components within normal limits  BASIC METABOLIC PANEL - Abnormal; Notable for the following:    Creatinine, Ser 1.15 (*)    GFR calc non Af Amer 52 (*)    GFR calc Af Amer 60 (*)    All other components within normal limits  PROTIME-INR   Imaging Review Venous Doppler, no evidence of thrombosis, positive Baker's cyst   MDM   Final diagnoses:  Leg pain    Patient has a Baker cyst on ultrasound. However pain seems to extend into her right ankle and lower calf.  At this time there does not appear to be any evidence of an acute emergency medical condition and the patient appears stable for discharge with appropriate outpatient follow up.  Discussed with patient followup with her primary doctor and/or orthopedist. Crutches were provided for comfort    Celene KrasJon R Jorel Gravlin, MD 11/10/13 1724

## 2014-03-06 ENCOUNTER — Other Ambulatory Visit: Payer: Self-pay | Admitting: Orthopedic Surgery

## 2014-03-06 DIAGNOSIS — M25561 Pain in right knee: Secondary | ICD-10-CM

## 2014-03-09 ENCOUNTER — Ambulatory Visit
Admission: RE | Admit: 2014-03-09 | Discharge: 2014-03-09 | Disposition: A | Discharge status: Home / Self Care | Payer: Medicaid Other | Source: Ambulatory Visit | Attending: Orthopedic Surgery | Admitting: Orthopedic Surgery

## 2014-03-09 DIAGNOSIS — M25561 Pain in right knee: Secondary | ICD-10-CM

## 2015-01-01 ENCOUNTER — Other Ambulatory Visit: Payer: Self-pay | Admitting: Otolaryngology

## 2015-01-22 ENCOUNTER — Other Ambulatory Visit: Payer: Self-pay | Admitting: Internal Medicine

## 2015-01-22 DIAGNOSIS — J42 Unspecified chronic bronchitis: Secondary | ICD-10-CM

## 2015-01-22 DIAGNOSIS — J449 Chronic obstructive pulmonary disease, unspecified: Secondary | ICD-10-CM

## 2015-01-31 ENCOUNTER — Other Ambulatory Visit: Payer: Medicaid Other

## 2015-02-06 ENCOUNTER — Other Ambulatory Visit: Payer: Medicaid Other

## 2015-02-27 ENCOUNTER — Other Ambulatory Visit: Payer: Self-pay | Admitting: Orthopedic Surgery

## 2015-02-27 DIAGNOSIS — M25511 Pain in right shoulder: Secondary | ICD-10-CM

## 2015-03-01 ENCOUNTER — Ambulatory Visit
Admission: RE | Admit: 2015-03-01 | Discharge: 2015-03-01 | Disposition: A | Discharge status: Home / Self Care | Payer: Medicaid Other | Source: Ambulatory Visit | Attending: Orthopedic Surgery | Admitting: Orthopedic Surgery

## 2015-03-01 DIAGNOSIS — M25511 Pain in right shoulder: Secondary | ICD-10-CM

## 2015-07-17 ENCOUNTER — Encounter: Payer: Self-pay | Admitting: Acute Care

## 2015-07-24 ENCOUNTER — Telehealth: Payer: Self-pay | Admitting: Acute Care

## 2015-07-24 ENCOUNTER — Other Ambulatory Visit: Payer: Self-pay | Admitting: Acute Care

## 2015-07-24 DIAGNOSIS — F17210 Nicotine dependence, cigarettes, uncomplicated: Principal | ICD-10-CM

## 2015-07-24 NOTE — Telephone Encounter (Signed)
I called and explained to this patient that Medicaid does not cover screening for Lung Cancer. I offered her [facility] which would be an out of pocket cost. She said that is not an option for her. I have let Dr. Waynard EdwardsPerini know we have cancelled the appointment at the patient's request due to cost, and asked him to re-refer when the patient has Medicare.

## 2015-08-01 ENCOUNTER — Encounter: Payer: Medicaid Other | Admitting: Acute Care

## 2016-08-30 ENCOUNTER — Other Ambulatory Visit: Payer: Self-pay | Admitting: Internal Medicine

## 2016-08-30 DIAGNOSIS — F17200 Nicotine dependence, unspecified, uncomplicated: Secondary | ICD-10-CM

## 2016-09-10 ENCOUNTER — Inpatient Hospital Stay
Admission: RE | Admit: 2016-09-10 | Discharge: 2016-09-10 | Disposition: A | Discharge status: Home / Self Care | Payer: Medicaid Other | Source: Ambulatory Visit | Attending: Internal Medicine | Admitting: Internal Medicine

## 2016-09-24 ENCOUNTER — Ambulatory Visit: Payer: Medicaid Other

## 2016-10-29 ENCOUNTER — Ambulatory Visit: Payer: Medicaid Other

## 2016-12-29 ENCOUNTER — Ambulatory Visit: Payer: Medicaid Other

## 2018-01-11 ENCOUNTER — Ambulatory Visit (INDEPENDENT_AMBULATORY_CARE_PROVIDER_SITE_OTHER): Payer: Self-pay | Admitting: Family

## 2018-01-11 ENCOUNTER — Encounter (INDEPENDENT_AMBULATORY_CARE_PROVIDER_SITE_OTHER): Payer: Self-pay | Admitting: Family

## 2018-01-11 ENCOUNTER — Ambulatory Visit (INDEPENDENT_AMBULATORY_CARE_PROVIDER_SITE_OTHER): Payer: Medicaid Other | Admitting: Family

## 2018-01-11 DIAGNOSIS — L03115 Cellulitis of right lower limb: Secondary | ICD-10-CM | POA: Diagnosis not present

## 2018-01-11 DIAGNOSIS — I872 Venous insufficiency (chronic) (peripheral): Secondary | ICD-10-CM | POA: Diagnosis not present

## 2018-01-11 NOTE — Progress Notes (Signed)
   Office Visit Note   Patient: [name]           Date of Birth: [birth year]           MRN: [id number] Visit Date: 01/11/2018              Requested by: Rodrigo RanPerini, Mark, MD [address] [place], [place] [address] PCP: Rodrigo RanPerini, Mark, MD  Chief Complaint  Patient presents with  . Right Leg - Pain  . Left Leg - Pain      HPI: The patient is a 62 year old woman seen today for evaluation of bilateral lower extremities. Has weeping and swelling to both legs. Associated with pain. Pain with ambulation. States will no longer wear her compression hose as these have made her legs worse.   Denies fevers or chills.  Assessment & Plan: Visit Diagnoses: No diagnosis found.  Plan: Dynaflex wraps applied. Will call in keflex. Follow up in office in 1 week.  Follow-Up Instructions: No follow-ups on file.   Ortho Exam  Patient is alert, oriented, no adenopathy, well-dressed, normal affect, normal respiratory effort. On examination bilateral lower extremities with pitting edema and weeping serous fluid. Cellulitic. Erythema and warmth. No odor. No purulence. No ulcers.  Imaging: No results found. No images are attached to the encounter.  Labs: Lab Results  Component Value Date   REPTSTATUS 08/01/2008 FINAL 07/30/2008   CULT  07/30/2008    Multiple bacterial morphotypes present, none predominant. Suggest appropriate recollection if clinically indicated.     Lab Results  Component Value Date   ALBUMIN 3.8 04/14/2011   ALBUMIN 3.6 03/14/2009   ALBUMIN 3.4 (L) 02/28/2009    There is no height or weight on file to calculate BMI.  Orders:  No orders of the defined types were placed in this encounter.  No orders of the defined types were placed in this encounter.    Procedures: No procedures performed  Clinical Data: No additional findings.  ROS:  All other systems negative, except as noted in the HPI. Review of Systems  Constitutional: Negative for chills and fever.    Cardiovascular: Positive for leg swelling.    Objective: Vital Signs: There were no vitals taken for this visit.  Specialty Comments:  No specialty comments available.  PMFS History: Patient Active Problem List   Diagnosis Date Noted  . BRONCHITIS, ACUTE 10/21/2008  . HYPERTENSION 07/13/2007  . ASTHMA 07/11/2007  . Esophageal reflux 07/11/2007  . SLEEP APNEA 07/11/2007  . SHORTNESS OF BREATH 07/11/2007  . COUGH 07/11/2007   Past Medical History:  Diagnosis Date  . COPD (chronic obstructive pulmonary disease) (HCC)   . Gout   . Hypercholesteremia     History reviewed. No pertinent family history.  Past Surgical History:  Procedure Laterality Date  . ABDOMINAL HYSTERECTOMY    . ROTATOR CUFF REPAIR     Social History   Occupational History  . Not on file  Tobacco Use  . Smoking status: Current Every Day Smoker    Packs/day: 1.00    Years: 30.00    Pack years: 30.00    Types: Cigarettes  . Smokeless tobacco: Never Used  Substance and Sexual Activity  . Alcohol use: No    Alcohol/week: 0.0 oz  . Drug use: No  . Sexual activity: Not on file

## 2018-01-18 ENCOUNTER — Ambulatory Visit (INDEPENDENT_AMBULATORY_CARE_PROVIDER_SITE_OTHER): Payer: Medicaid Other | Admitting: Family

## 2018-01-18 ENCOUNTER — Encounter (INDEPENDENT_AMBULATORY_CARE_PROVIDER_SITE_OTHER): Payer: Self-pay | Admitting: Family

## 2018-01-18 VITALS — Ht 65.0 in | Wt 187.0 lb

## 2018-01-18 DIAGNOSIS — I872 Venous insufficiency (chronic) (peripheral): Secondary | ICD-10-CM

## 2018-01-18 NOTE — Progress Notes (Signed)
   Office Visit Note   Patient: [name]           Date of Birth: Apr 07, 1956           MRN: [id number] Visit Date: 01/18/2018              Requested by: Rodrigo RanPerini, Mark, MD [address] [place], [place] [address] PCP: Rodrigo RanPerini, Mark, MD  Chief Complaint  Patient presents with  . Right Leg - Follow-up  . Left Leg - Follow-up      HPI: The patient is a 62 year old woman seen today in follow up for swelling and cellulitis of bilateral lower extremities.  Associated with pain. Pain with ambulation. In wheel chair for ambulation.  Denies fevers or chills.  Assessment & Plan: Visit Diagnoses:  1. Venous insufficiency (chronic) (peripheral)     Plan: resume compression garments. Elevate legs at rest.  Follow-Up Instructions: Return in about 1 month (around 02/15/2018), or if symptoms worsen or fail to improve.   Ortho Exam  Patient is alert, oriented, no adenopathy, well-dressed, normal affect, normal respiratory effort. On examination bilateral lower extremities reduction of edema with wrinkling of skin. no weeping No erythema and warmth. No odor. No purulence. No ulcers.  Imaging: No results found. No images are attached to the encounter.  Labs: Lab Results  Component Value Date   REPTSTATUS 08/01/2008 FINAL 07/30/2008   CULT  07/30/2008    Multiple bacterial morphotypes present, none predominant. Suggest appropriate recollection if clinically indicated.     Lab Results  Component Value Date   ALBUMIN 3.8 04/14/2011   ALBUMIN 3.6 03/14/2009   ALBUMIN 3.4 (L) 02/28/2009    Body mass index is 31.12 kg/m.  Orders:  No orders of the defined types were placed in this encounter.  No orders of the defined types were placed in this encounter.    Procedures: No procedures performed  Clinical Data: No additional findings.  ROS:  All other systems negative, except as noted in the HPI. Review of Systems  Constitutional: Negative for chills and fever.    Cardiovascular: Positive for leg swelling.    Objective: Vital Signs: Ht 5\' 5"  (1.651 m)   Wt 187 lb (84.8 kg)   BMI 31.12 kg/m   Specialty Comments:  No specialty comments available.  PMFS History: Patient Active Problem List   Diagnosis Date Noted  . Venous insufficiency (chronic) (peripheral) 01/11/2018  . BRONCHITIS, ACUTE 10/21/2008  . HYPERTENSION 07/13/2007  . ASTHMA 07/11/2007  . Esophageal reflux 07/11/2007  . SLEEP APNEA 07/11/2007  . SHORTNESS OF BREATH 07/11/2007  . COUGH 07/11/2007   Past Medical History:  Diagnosis Date  . COPD (chronic obstructive pulmonary disease) (HCC)   . Gout   . Hypercholesteremia     History reviewed. No pertinent family history.  Past Surgical History:  Procedure Laterality Date  . ABDOMINAL HYSTERECTOMY    . ROTATOR CUFF REPAIR     Social History   Occupational History  . Not on file  Tobacco Use  . Smoking status: Current Every Day Smoker    Packs/day: 1.00    Years: 30.00    Pack years: 30.00    Types: Cigarettes  . Smokeless tobacco: Never Used  Substance and Sexual Activity  . Alcohol use: No    Alcohol/week: 0.0 oz  . Drug use: No  . Sexual activity: Not on file

## 2018-04-04 ENCOUNTER — Emergency Department (HOSPITAL_COMMUNITY): Payer: Medicaid Other

## 2018-04-04 ENCOUNTER — Encounter (HOSPITAL_COMMUNITY): Payer: Self-pay

## 2018-04-04 ENCOUNTER — Inpatient Hospital Stay (HOSPITAL_COMMUNITY)
Admission: EM | Admit: 2018-04-04 | Discharge: 2018-04-06 | DRG: 690 | Disposition: A | Discharge status: Home Health | Payer: Medicaid Other | Attending: Internal Medicine | Admitting: Internal Medicine

## 2018-04-04 ENCOUNTER — Other Ambulatory Visit: Payer: Self-pay

## 2018-04-04 DIAGNOSIS — R262 Difficulty in walking, not elsewhere classified: Secondary | ICD-10-CM | POA: Diagnosis present

## 2018-04-04 DIAGNOSIS — I872 Venous insufficiency (chronic) (peripheral): Secondary | ICD-10-CM | POA: Diagnosis not present

## 2018-04-04 DIAGNOSIS — E039 Hypothyroidism, unspecified: Secondary | ICD-10-CM | POA: Diagnosis present

## 2018-04-04 DIAGNOSIS — N12 Tubulo-interstitial nephritis, not specified as acute or chronic: Principal | ICD-10-CM | POA: Diagnosis present

## 2018-04-04 DIAGNOSIS — I878 Other specified disorders of veins: Secondary | ICD-10-CM | POA: Diagnosis present

## 2018-04-04 DIAGNOSIS — J45909 Unspecified asthma, uncomplicated: Secondary | ICD-10-CM | POA: Diagnosis present

## 2018-04-04 DIAGNOSIS — B9620 Unspecified Escherichia coli [E. coli] as the cause of diseases classified elsewhere: Secondary | ICD-10-CM | POA: Diagnosis present

## 2018-04-04 DIAGNOSIS — E876 Hypokalemia: Secondary | ICD-10-CM | POA: Diagnosis present

## 2018-04-04 DIAGNOSIS — Z8249 Family history of ischemic heart disease and other diseases of the circulatory system: Secondary | ICD-10-CM

## 2018-04-04 DIAGNOSIS — Z23 Encounter for immunization: Secondary | ICD-10-CM | POA: Diagnosis not present

## 2018-04-04 DIAGNOSIS — Z7952 Long term (current) use of systemic steroids: Secondary | ICD-10-CM | POA: Diagnosis not present

## 2018-04-04 DIAGNOSIS — Z79890 Hormone replacement therapy (postmenopausal): Secondary | ICD-10-CM

## 2018-04-04 DIAGNOSIS — Z9181 History of falling: Secondary | ICD-10-CM | POA: Diagnosis not present

## 2018-04-04 DIAGNOSIS — K219 Gastro-esophageal reflux disease without esophagitis: Secondary | ICD-10-CM | POA: Diagnosis present

## 2018-04-04 DIAGNOSIS — Z87891 Personal history of nicotine dependence: Secondary | ICD-10-CM

## 2018-04-04 DIAGNOSIS — F329 Major depressive disorder, single episode, unspecified: Secondary | ICD-10-CM | POA: Diagnosis not present

## 2018-04-04 DIAGNOSIS — J449 Chronic obstructive pulmonary disease, unspecified: Secondary | ICD-10-CM | POA: Diagnosis present

## 2018-04-04 DIAGNOSIS — G8929 Other chronic pain: Secondary | ICD-10-CM | POA: Diagnosis present

## 2018-04-04 DIAGNOSIS — F32A Depression, unspecified: Secondary | ICD-10-CM

## 2018-04-04 DIAGNOSIS — J209 Acute bronchitis, unspecified: Secondary | ICD-10-CM | POA: Diagnosis present

## 2018-04-04 DIAGNOSIS — I10 Essential (primary) hypertension: Secondary | ICD-10-CM | POA: Diagnosis not present

## 2018-04-04 DIAGNOSIS — R197 Diarrhea, unspecified: Secondary | ICD-10-CM | POA: Diagnosis present

## 2018-04-04 DIAGNOSIS — M25561 Pain in right knee: Secondary | ICD-10-CM | POA: Diagnosis present

## 2018-04-04 LAB — URINALYSIS, ROUTINE W REFLEX MICROSCOPIC
Bilirubin Urine: NEGATIVE
Glucose, UA: NEGATIVE mg/dL
Ketones, ur: NEGATIVE mg/dL
Nitrite: NEGATIVE
PROTEIN: NEGATIVE mg/dL
Specific Gravity, Urine: 1.002 — ABNORMAL LOW (ref 1.005–1.030)
pH: 8 (ref 5.0–8.0)

## 2018-04-04 LAB — COMPREHENSIVE METABOLIC PANEL
ALT: 9 U/L (ref 0–44)
AST: 11 U/L — ABNORMAL LOW (ref 15–41)
Albumin: 3 g/dL — ABNORMAL LOW (ref 3.5–5.0)
Alkaline Phosphatase: 74 U/L (ref 38–126)
Anion gap: 9 (ref 5–15)
CHLORIDE: 111 mmol/L (ref 98–111)
CO2: 26 mmol/L (ref 22–32)
CREATININE: 0.59 mg/dL (ref 0.44–1.00)
Calcium: 7.7 mg/dL — ABNORMAL LOW (ref 8.9–10.3)
GFR calc Af Amer: >60 mL/min (ref >60)
Glucose, Bld: 85 mg/dL (ref 70–99)
POTASSIUM: 2.6 mmol/L — AB (ref 3.5–5.1)
SODIUM: 146 mmol/L — AB (ref 135–145)
Total Bilirubin: 0.6 mg/dL (ref 0.3–1.2)
Total Protein: 5.8 g/dL — ABNORMAL LOW (ref 6.5–8.1)

## 2018-04-04 LAB — CBC WITH DIFFERENTIAL/PLATELET
BASOS ABS: 0 10*3/uL (ref 0.0–0.1)
Basophils Relative: 1 %
EOS ABS: 0.1 10*3/uL (ref 0.0–0.7)
EOS PCT: 2 %
HCT: 37.5 % (ref 36.0–46.0)
Hemoglobin: 12.3 g/dL (ref 12.0–15.0)
Lymphocytes Relative: 36 %
Lymphs Abs: 2.4 10*3/uL (ref 0.7–4.0)
MCH: 33.6 pg (ref 26.0–34.0)
MCHC: 32.8 g/dL (ref 30.0–36.0)
MCV: 102.5 fL — ABNORMAL HIGH (ref 78.0–100.0)
MONO ABS: 0.5 10*3/uL (ref 0.1–1.0)
Monocytes Relative: 7 %
Neutro Abs: 3.8 10*3/uL (ref 1.7–7.7)
Neutrophils Relative %: 54 %
Platelets: 281 10*3/uL (ref 150–400)
RBC: 3.66 MIL/uL — AB (ref 3.87–5.11)
RDW: 15.6 % — AB (ref 11.5–15.5)
WBC: 6.8 10*3/uL (ref 4.0–10.5)

## 2018-04-04 LAB — MAGNESIUM: Magnesium: 1.7 mg/dL (ref 1.7–2.4)

## 2018-04-04 LAB — LIPASE, BLOOD: LIPASE: 21 U/L (ref 11–51)

## 2018-04-04 MED ORDER — ENOXAPARIN SODIUM 40 MG/0.4ML ~~LOC~~ SOLN
40.0000 mg | SUBCUTANEOUS | Status: DC
  Administered 2018-04-04 – 2018-04-05 (×2): 40 mg via SUBCUTANEOUS
  Filled 2018-04-04 (×2): qty 0.4

## 2018-04-04 MED ORDER — ONDANSETRON HCL 4 MG PO TABS: 4.0000 mg | ORAL_TABLET | Freq: Four times a day (QID) | ORAL | Status: DC | PRN

## 2018-04-04 MED ORDER — MOMETASONE FURO-FORMOTEROL FUM 200-5 MCG/ACT IN AERO
2.0000 | INHALATION_SPRAY | Freq: Two times a day (BID) | RESPIRATORY_TRACT | Status: DC
  Administered 2018-04-04 – 2018-04-06 (×3): 2 via RESPIRATORY_TRACT
  Filled 2018-04-04: qty 8.8

## 2018-04-04 MED ORDER — IOPAMIDOL (ISOVUE-300) INJECTION 61%
INTRAVENOUS | Status: AC
  Filled 2018-04-04: qty 100

## 2018-04-04 MED ORDER — POTASSIUM CHLORIDE CRYS ER 20 MEQ PO TBCR
40.0000 meq | EXTENDED_RELEASE_TABLET | ORAL | Status: AC
  Administered 2018-04-04 (×2): 40 meq via ORAL
  Filled 2018-04-04: qty 2

## 2018-04-04 MED ORDER — POTASSIUM CHLORIDE 10 MEQ/100ML IV SOLN
10.0000 meq | Freq: Once | INTRAVENOUS | Status: AC
  Administered 2018-04-04: 10 meq via INTRAVENOUS
  Filled 2018-04-04: qty 100

## 2018-04-04 MED ORDER — PANTOPRAZOLE SODIUM 40 MG PO TBEC
40.0000 mg | DELAYED_RELEASE_TABLET | Freq: Every day | ORAL | Status: DC
  Administered 2018-04-05 – 2018-04-06 (×2): 40 mg via ORAL
  Filled 2018-04-04 (×2): qty 1

## 2018-04-04 MED ORDER — VENLAFAXINE HCL ER 75 MG PO CP24
150.0000 mg | ORAL_CAPSULE | Freq: Every day | ORAL | Status: DC
  Administered 2018-04-04 – 2018-04-05 (×2): 150 mg via ORAL
  Filled 2018-04-04 (×2): qty 2

## 2018-04-04 MED ORDER — IOPAMIDOL (ISOVUE-300) INJECTION 61%
100.0000 mL | Freq: Once | INTRAVENOUS | Status: AC | PRN
  Administered 2018-04-04: 100 mL via INTRAVENOUS

## 2018-04-04 MED ORDER — POTASSIUM CHLORIDE IN NACL 20-0.45 MEQ/L-% IV SOLN
INTRAVENOUS | Status: AC
  Administered 2018-04-04: 22:00:00 via INTRAVENOUS
  Filled 2018-04-04 (×2): qty 1000

## 2018-04-04 MED ORDER — CEFTRIAXONE SODIUM 1 G IJ SOLR
1.0000 g | Freq: Once | INTRAMUSCULAR | Status: AC
  Administered 2018-04-04: 1 g via INTRAVENOUS
  Filled 2018-04-04: qty 10

## 2018-04-04 MED ORDER — ATORVASTATIN CALCIUM 10 MG PO TABS
10.0000 mg | ORAL_TABLET | Freq: Every day | ORAL | Status: DC
  Administered 2018-04-05 – 2018-04-06 (×2): 10 mg via ORAL
  Filled 2018-04-04 (×2): qty 1

## 2018-04-04 MED ORDER — GABAPENTIN 100 MG PO CAPS
100.0000 mg | ORAL_CAPSULE | Freq: Three times a day (TID) | ORAL | Status: DC
  Administered 2018-04-04 – 2018-04-06 (×5): 100 mg via ORAL
  Filled 2018-04-04 (×5): qty 1

## 2018-04-04 MED ORDER — SODIUM CHLORIDE 0.9 % IV SOLN
1.0000 g | INTRAVENOUS | Status: DC
  Administered 2018-04-05: 1 g via INTRAVENOUS
  Filled 2018-04-04: qty 1
  Filled 2018-04-04: qty 10

## 2018-04-04 MED ORDER — QUETIAPINE FUMARATE 100 MG PO TABS
100.0000 mg | ORAL_TABLET | Freq: Every day | ORAL | Status: DC
  Administered 2018-04-04 – 2018-04-05 (×2): 100 mg via ORAL
  Filled 2018-04-04 (×2): qty 1

## 2018-04-04 MED ORDER — HYDROCODONE-ACETAMINOPHEN 10-325 MG PO TABS
1.0000 | ORAL_TABLET | Freq: Four times a day (QID) | ORAL | Status: DC | PRN
  Administered 2018-04-05 – 2018-04-06 (×5): 1 via ORAL
  Filled 2018-04-04 (×5): qty 1

## 2018-04-04 MED ORDER — POTASSIUM CHLORIDE CRYS ER 20 MEQ PO TBCR
40.0000 meq | EXTENDED_RELEASE_TABLET | Freq: Once | ORAL | Status: AC
  Administered 2018-04-04: 40 meq via ORAL
  Filled 2018-04-04: qty 2

## 2018-04-04 MED ORDER — ONDANSETRON HCL 4 MG/2ML IJ SOLN: 4.0000 mg | Freq: Four times a day (QID) | INTRAMUSCULAR | Status: DC | PRN

## 2018-04-04 MED ORDER — ACETAMINOPHEN 650 MG RE SUPP: 650.0000 mg | Freq: Four times a day (QID) | RECTAL | Status: DC | PRN

## 2018-04-04 MED ORDER — ACETAMINOPHEN 325 MG PO TABS: 650.0000 mg | ORAL_TABLET | Freq: Four times a day (QID) | ORAL | Status: DC | PRN

## 2018-04-04 MED ORDER — LEVOTHYROXINE SODIUM 25 MCG PO TABS
125.0000 ug | ORAL_TABLET | Freq: Every day | ORAL | Status: DC
  Administered 2018-04-05 – 2018-04-06 (×2): 125 ug via ORAL
  Filled 2018-04-04 (×3): qty 1

## 2018-04-04 MED ORDER — ALPRAZOLAM 1 MG PO TABS
1.0000 mg | ORAL_TABLET | Freq: Three times a day (TID) | ORAL | Status: DC
  Administered 2018-04-04 – 2018-04-06 (×5): 1 mg via ORAL
  Filled 2018-04-04 (×5): qty 1

## 2018-04-04 MED ORDER — BUPROPION HCL ER (XL) 150 MG PO TB24
150.0000 mg | ORAL_TABLET | Freq: Every day | ORAL | Status: DC
  Administered 2018-04-04 – 2018-04-05 (×2): 150 mg via ORAL
  Filled 2018-04-04 (×2): qty 1

## 2018-04-04 NOTE — ED Notes (Addendum)
Provided peri care for pt. Also placed pts rings inside a sample cup with her label on it.(two rings) The sample cup was placed inside her box she brought with with all of her medications inside.

## 2018-04-04 NOTE — H&P (Signed)
History and Physical    [name]:[id number] DOB: [birth year] DOA: 04/04/2018  PCP: Rodrigo Ran, MD   Patient coming from: Home  Chief Complaint: Burning with urination, Abdominal and Flank pain  HPI: [name] is a 62 y.o. female with medical history significant Asthma, Depression, chronic venous insufficiency, who presented to the ED with complaints of burning with urination, with frequency and lower abdominal pain of about 1 week duration.  Also reports left flank pain of several days.  Patient denies vomiting but reports poor p.o. intake and diarrhea, previously, but had one episode of loose stool yesterday and none today.  Patient reports she was prescribed an antibiotic, ?name, for UTI after which loose stool started.  Reports chills without fevers. Patient also with chronic right knee pain, she is supposed to have a knee replacement soon, by dr. Lajoyce Corners,.  Reports falls secondary to the knee pain, but ambulates with walker and wheelchair at home.  ED Course: Stable vitals. K - 2.6, WBC 6.8.  UA small leukocytes many bacteria.  CT abdomen and pelvis with contrast-under distended urinary bladder with circumferential wall thickening and diffuse mucosal enhancement suggestive of cystitis, enhancements of the lining of the left ureter and left renal pelvis, also inflamed.  Enlarged liver.  Patient was given 1 g ceftriaxone, potassium replacement IV and p.o.. S called to admit for pyelonephritis  Review of Systems: As per HPI otherwise all other systems reviewed and negative.  Past Medical History:  Diagnosis Date  . COPD (chronic obstructive pulmonary disease) (HCC)   . Gout   . Hypercholesteremia     Past Surgical History:  Procedure Laterality Date  . ABDOMINAL HYSTERECTOMY    . ROTATOR CUFF REPAIR       reports that she has been smoking cigarettes. She has a 30.00 pack-year smoking history. She has never used smokeless tobacco. She reports that she does not drink alcohol or  use drugs.  No Known Allergies  Family history of hypertension.  Prior to Admission medications   Medication Sig Start Date End Date Taking? Authorizing Provider  ALPRAZolam Prudy Feeler) 1 MG tablet Take 1 mg by mouth 3 (three) times daily. 03/22/18  Yes [provider]  ARIPiprazole (ABILIFY) 15 MG tablet Take 15 mg by mouth at bedtime. 03/13/18  Yes [provider]  atorvastatin (LIPITOR) 10 MG tablet Take 10 mg by mouth daily.  10/10/17  Yes [provider]  buPROPion (WELLBUTRIN XL) 150 MG 24 hr tablet Take 150 mg by mouth every evening.    Yes [provider]  cyclobenzaprine (FLEXERIL) 5 MG tablet Take 5 mg by mouth 3 (three) times daily.  12/31/17  Yes [provider]  desvenlafaxine (PRISTIQ) 50 MG 24 hr tablet Take 50 mg by mouth every evening.    Yes [provider]  DEXILANT 60 MG capsule Take 60 mg by mouth daily. 12/27/17  Yes [provider]  furosemide (LASIX) 20 MG tablet Take 20 mg by mouth every other day. 02/07/18  Yes [provider]  gabapentin (NEURONTIN) 100 MG capsule Take 100 mg by mouth 3 (three) times daily.  12/26/17  Yes [provider]  HYDROcodone-acetaminophen (NORCO) 10-325 MG tablet Take 1 tablet by mouth every 6 (six) hours as needed for moderate pain. for pain 01/02/18  Yes [provider]  levothyroxine (SYNTHROID, LEVOTHROID) 125 MCG tablet Take 125 mcg by mouth daily. 03/03/18  Yes [provider]  nitroGLYCERIN (NITROSTAT) 0.4 MG SL tablet Place 0.4 mg  under the tongue every 5 (five) minutes as needed for chest pain.   Yes [provider]  QUEtiapine (SEROQUEL) 100 MG tablet Take 100 mg by mouth at bedtime.  12/12/17  Yes [provider]  SYMBICORT 160-4.5 MCG/ACT inhaler Inhale 2 puffs into the lungs 2 (two) times daily.  11/14/17  Yes [provider]  venlafaxine XR (EFFEXOR-XR) 150 MG 24 hr capsule Take 150 mg by mouth daily. 02/24/18  Yes [provider]  ALPRAZolam Prudy Feeler) 0.5 MG tablet Take 0.5 mg by mouth 3 (three) times daily as needed for anxiety.    [provider]    Physical Exam: Vitals:   04/04/18 1500 04/04/18 1530 04/04/18 1556 04/04/18 1742  BP: (!) 131/94 103/67 103/67 108/68  Pulse: 92 73 73 77  Resp:   18 18  Temp:      TempSrc:      SpO2: 100% 99% 99% 99%    Constitutional: NAD, calm, comfortable Vitals:   04/04/18 1500 04/04/18 1530 04/04/18 1556 04/04/18 1742  BP: (!) 131/94 103/67 103/67 108/68  Pulse: 92 73 73 77  Resp:   18 18  Temp:      TempSrc:      SpO2: 100% 99% 99% 99%   Eyes: PERRL, lids and conjunctivae normal ENMT: Mucous membranes are dry. Posterior pharynx clear of any exudate or lesions. Neck: normal, supple, no masses Respiratory: clear to auscultation bilaterally, no wheezing, no crackles. Normal respiratory effort. No accessory muscle use.  Cardiovascular: Regular rate and rhythm, no murmurs / rubs / gallops. 2+ pedal pulses. No carotid bruits.  Chronic bilateral lower extremity stasis edema, with skin changes. Abdomen:obese, mild suprapubic tenderness, no masses appreciated ,bowel sounds positive.  No CVA tenderness appreciated Musculoskeletal: no clubbing / cyanosis.  Right knee swollen compared to left without erythema or warmth,.  Range of motion limited by pain. skin: no rashes, lesions, ulcers. No induration Neurologic: CN 2-12 grossly intact. Strength 5/5 in all 4.  Psychiatric: Normal judgment and insight. Alert and oriented x 3. Normal mood.   Labs on Admission: I have personally reviewed following labs and imaging studies  CBC: Recent Labs  Lab 04/04/18 1357  WBC 6.8  NEUTROABS 3.8  HGB 12.3  HCT 37.5  MCV 102.5*  PLT 281   Basic Metabolic Panel: Recent Labs  Lab 04/04/18 1357  NA 146*  K 2.6*  CL 111  CO2 26  GLUCOSE 85  BUN <5*  CREATININE 0.59  CALCIUM 7.7*   GFR: CrCl cannot be calculated (Unknown ideal weight.). Liver Function  Tests: Recent Labs  Lab 04/04/18 1357  AST 11*  ALT 9  ALKPHOS 74  BILITOT 0.6  PROT 5.8*  ALBUMIN 3.0*   Recent Labs  Lab 04/04/18 1357  LIPASE 21   Urine analysis:    Component Value Date/Time   COLORURINE YELLOW 04/04/2018 1439   APPEARANCEUR HAZY (A) 04/04/2018 1439   LABSPEC 1.002 (L) 04/04/2018 1439   PHURINE 8.0 04/04/2018 1439   GLUCOSEU NEGATIVE 04/04/2018 1439   HGBUR MODERATE (A) 04/04/2018 1439   BILIRUBINUR NEGATIVE 04/04/2018 1439   KETONESUR NEGATIVE 04/04/2018 1439   PROTEINUR NEGATIVE 04/04/2018 1439   UROBILINOGEN 0.2 04/14/2011 1651   NITRITE NEGATIVE 04/04/2018 1439   LEUKOCYTESUR SMALL (A) 04/04/2018 1439    Radiological Exams on Admission: Ct Abdomen Pelvis W Contrast  Result Date: 04/04/2018 CLINICAL DATA:  62 year old female with generalized weakness. Nausea and lower abdominal tenderness with palpation. Recent diagnosis of UTI. Prior hysterectomy.  Initial encounter. EXAM: CT ABDOMEN AND PELVIS WITH CONTRAST TECHNIQUE: Multidetector CT imaging of the abdomen and pelvis was performed using the standard protocol following bolus administration of intravenous contrast. CONTRAST:  ISOVUE-300 IOPAMIDOL (ISOVUE-300) INJECTION 61% COMPARISON:  04/14/2011 CT. FINDINGS: Lower chest: Minimal scarring lung bases.  Heart size top-normal. Hepatobiliary: Enlarged liver spanning over 19.2 cm. No focal hepatic lesion. Post cholecystectomy. No common bile duct calcified stone noted. Pancreas: No pancreatic mass or inflammation. Spleen: No splenic mass or enlargement. Adrenals/Urinary Tract: No obstructing stone or hydronephrosis. Mucosal enhancement of the left renal collecting system including left renal pelvis and left ureter as well as diffuse circumferential enhancement of the bladder wall suggestive of diffuse inflammation of the bladder and left renal collecting system. Tumor felt unlikely given the diffuse enhancement. Under distended urinary bladder with  circumferential wall thickening. Slightly motion degraded exam without worrisome renal or adrenal mass. Stomach/Bowel: Majority of the colon is under distended limiting evaluation. No extraluminal bowel inflammatory process. No inflammation surrounds the proximal aspect of the appendix (distal aspect not well delineated). Stomach under distended without gross abnormality noted. No small bowel abnormality detected. Vascular/Lymphatic: Atherosclerotic changes aorta and aortic branch vessels. No abdominal aortic aneurysm or large vessel occlusion. Scattered normal size lymph nodes. Reproductive: Prior hysterectomy.  No worrisome adnexal mass. Other: No free intraperitoneal air or bowel containing hernia. Musculoskeletal: Scoliosis lumbar spine. Transitional vertebra S1. Moderate to marked degenerative changes L3-4 through L5-S1 with prominent spinal stenosis. IMPRESSION: 1. Under distended urinary bladder with circumferential wall thickening and diffuse mucosal enhancement suggestive of cystitis. Additionally, there is enhancement of the lining of the left ureter and left renal pelvis which also appears inflamed. 2. Evaluation of large portions of bowel limited by under distension. No extraluminal bowel inflammatory process noted. 3. Post cholecystectomy and hysterectomy. 4. Transitional S1 vertebral body. Prominent degenerative changes L3-4 through L5-S1 with significant spinal stenosis. 5.  Aortic Atherosclerosis (ICD10-I70.0). 6. Enlarged liver spanning over 19.2 cm. Electronically Signed   By: Lacy Duverney M.D.   On: 04/04/2018 17:37   Dg Knee Complete 4 Views Right  Result Date: 04/04/2018 CLINICAL DATA:  62 year old female with chronic right knee pain. No reported trauma. Initial encounter. EXAM: RIGHT KNEE - COMPLETE 4+ VIEW COMPARISON:  03/09/2014 MR. FINDINGS: Marked tricompartment degenerative changes. Small to slightly moderate-size suprapatellar joint effusion. No fracture or dislocation. IMPRESSION: 1.  Marked tricompartment degenerative changes. 2. Small to slightly moderate-size suprapatellar joint effusion. Electronically Signed   By: Lacy Duverney M.D.   On: 04/04/2018 15:35    EKG: None  Assessment/Plan Active Problems:   Essential hypertension   BRONCHITIS, ACUTE   Asthma   Venous insufficiency (chronic) (peripheral)   Pyelonephritis   Depression    Pyelonephritis-dysuria, left flank pain, CT suggestive of renal pelvis inflammation and cystitis.  WBC 6.8.  Does not meet sepsis criteria at this time.  -Continue IV ceftriaxone started in ED -Follow-up urine cultures drawn in ED -Hydrate 1/2 NS + 20KCL 100cc/hr X 12 hrs.  Hypokalemia- 2.6.  Likely from poor p.o. intake and diarrhea which appears to have resolved. -Replete -BMP a.m. -Check magnesium  Depression-patient appears to be on duplicates of medications-Seroquel and and Abilify, venlafaxine and desvenlafaxine.  Medications Wellbutrin and Xanax 1mg  TID.Marland Kitchen -Continue home medications Xanax, Wellbutrin, Seroquel and venlafaxine.  Asthma-stable.  -Continue home bronchodilators.  Hypothyroidism-continue home Synthroid  HIV testing as part of routine health screening   DVT prophylaxis: Lovenox Code Status: full Family Communication: None at bedside Disposition  Plan: Per rounding team Consults called: None Admission status: Inpatient, MedSurg   Onnie Boer MD [facility] Pager [phone] From 6PM-2AM.  Otherwise please contact night-coverage [email or web address] Password [facility]  04/04/2018, 6:36 PM

## 2018-04-04 NOTE — ED Triage Notes (Signed)
EMS reports from home, generalized weakness, knee pain and some nausea with lower abdominal tenderness upon palpation, Pt states Dx with UTI recently.  BP 144/96 HR 78 RR 18 Sp02 97 RA CBG 123  22ga R Hand

## 2018-04-04 NOTE — ED Notes (Signed)
ED TO INPATIENT HANDOFF REPORT  Name/Age/Gender [name] 62 y.o. female  Code Status   Home/SNF/Other Home  Chief Complaint weakness  Level of Care/Admitting Diagnosis ED Disposition    ED Disposition Condition Comment   Admit  Hospital Area: [facility] [[id number]]  Level of Care: Med-Surg [16]  Diagnosis: Pyelonephritis [242234]  Admitting Physician: EMOKPAE, EJIROGHENE E [[id number]]  Attending Physician: EMOKPAE, EJIROGHENE E [[id number]]  Estimated length of stay: past midnight tomorrow  Certification:: I certify this patient will need inpatient services for at least 2 midnights  PT Class (Do Not Modify): Inpatient [101]  PT Acc Code (Do Not Modify): Private [1]       Medical History Past Medical History:  Diagnosis Date  . COPD (chronic obstructive pulmonary disease) (HCC)   . Gout   . Hypercholesteremia     Allergies No Known Allergies  IV Location/Drains/Wounds Patient Lines/Drains/Airways Status   Active Line/Drains/Airways    Name:   Placement date:   Placement time:   Site:   Days:   Peripheral IV 04/04/18 Right Hand   04/04/18    1330    Hand   less than 1          Labs/Imaging Results for orders placed or performed during the hospital encounter of 04/04/18 (from the past 48 hour(s))  CBC with Differential     Status: Abnormal   Collection Time: 04/04/18  1:57 PM  Result Value Ref Range   WBC 6.8 4.0 - 10.5 K/uL   RBC 3.66 (L) 3.87 - 5.11 MIL/uL   Hemoglobin 12.3 12.0 - 15.0 g/dL   HCT 37.5 36.0 - 46.0 %   MCV 102.5 (H) 78.0 - 100.0 fL   MCH 33.6 26.0 - 34.0 pg   MCHC 32.8 30.0 - 36.0 g/dL   RDW 15.6 (H) 11.5 - 15.5 %   Platelets 281 150 - 400 K/uL   Neutrophils Relative % 54 %   Neutro Abs 3.8 1.7 - 7.7 K/uL   Lymphocytes Relative 36 %   Lymphs Abs 2.4 0.7 - 4.0 K/uL   Monocytes Relative 7 %   Monocytes Absolute 0.5 0.1 - 1.0 K/uL   Eosinophils Relative 2 %   Eosinophils Absolute 0.1 0.0 - 0.7 K/uL   Basophils Relative 1  %   Basophils Absolute 0.0 0.0 - 0.1 K/uL    Comment: Performed at [facility], [address] [place], [place] [address]  Comprehensive metabolic panel     Status: Abnormal   Collection Time: 04/04/18  1:57 PM  Result Value Ref Range   Sodium 146 (H) 135 - 145 mmol/L   Potassium 2.6 (LL) 3.5 - 5.1 mmol/L    Comment: CRITICAL RESULT CALLED TO, READ BACK BY AND VERIFIED WITH: TAI,M. RN @1451 ON 09.03.19 BY COHEN,K    Chloride 111 98 - 111 mmol/L   CO2 26 22 - 32 mmol/L   Glucose, Bld 85 70 - 99 mg/dL   BUN <5 (L) 8 - 23 mg/dL   Creatinine, Ser 0.59 0.44 - 1.00 mg/dL   Calcium 7.7 (L) 8.9 - 10.3 mg/dL   Total Protein 5.8 (L) 6.5 - 8.1 g/dL   Albumin 3.0 (L) 3.5 - 5.0 g/dL   AST 11 (L) 15 - 41 U/L   ALT 9 0 - 44 U/L   Alkaline Phosphatase 74 38 - 126 U/L   Total Bilirubin 0.6 0.3 - 1.2 mg/dL   GFR calc non Af Amer >60 >60 mL/min     GFR calc Af Amer >60 >60 mL/min    Comment: (NOTE) The eGFR has been calculated using the CKD EPI equation. This calculation has not been validated in all clinical situations. eGFR's persistently <60 mL/min signify possible Chronic Kidney Disease.    Anion gap 9 5 - 15    Comment: Performed at [facility], [address] [place], [place] [address]  Lipase, blood     Status: None   Collection Time: 04/04/18  1:57 PM  Result Value Ref Range   Lipase 21 11 - 51 U/L    Comment: Performed at [facility], [address] [place], [place] [address]  Magnesium     Status: None   Collection Time: 04/04/18  1:57 PM  Result Value Ref Range   Magnesium 1.7 1.7 - 2.4 mg/dL    Comment: Performed at [facility], [address] [place], [place] [address]  Urinalysis, Routine w reflex microscopic     Status: Abnormal   Collection Time: 04/04/18  2:39 PM  Result Value Ref Range   Color, Urine YELLOW YELLOW   APPearance HAZY (A) CLEAR   Specific Gravity, Urine 1.002 (L) 1.005 -  1.030   pH 8.0 5.0 - 8.0   Glucose, UA NEGATIVE NEGATIVE mg/dL   Hgb urine dipstick MODERATE (A) NEGATIVE   Bilirubin Urine NEGATIVE NEGATIVE   Ketones, ur NEGATIVE NEGATIVE mg/dL   Protein, ur NEGATIVE NEGATIVE mg/dL   Nitrite NEGATIVE NEGATIVE   Leukocytes, UA SMALL (A) NEGATIVE   RBC / HPF 0-5 0 - 5 RBC/hpf   WBC, UA 11-20 0 - 5 WBC/hpf   Bacteria, UA MANY (A) NONE SEEN   Squamous Epithelial / LPF 0-5 0 - 5    Comment: Performed at [facility], [address] [place], [place] [address]   Ct Abdomen Pelvis W Contrast  Result Date: 04/04/2018 CLINICAL DATA:  62-year-old female with generalized weakness. Nausea and lower abdominal tenderness with palpation. Recent diagnosis of UTI. Prior hysterectomy. Initial encounter. EXAM: CT ABDOMEN AND PELVIS WITH CONTRAST TECHNIQUE: Multidetector CT imaging of the abdomen and pelvis was performed using the standard protocol following bolus administration of intravenous contrast. CONTRAST:  100mL ISOVUE-300 IOPAMIDOL (ISOVUE-300) INJECTION 61% COMPARISON:  04/14/2011 CT. FINDINGS: Lower chest: Minimal scarring lung bases.  Heart size top-normal. Hepatobiliary: Enlarged liver spanning over 19.2 cm. No focal hepatic lesion. Post cholecystectomy. No common bile duct calcified stone noted. Pancreas: No pancreatic mass or inflammation. Spleen: No splenic mass or enlargement. Adrenals/Urinary Tract: No obstructing stone or hydronephrosis. Mucosal enhancement of the left renal collecting system including left renal pelvis and left ureter as well as diffuse circumferential enhancement of the bladder wall suggestive of diffuse inflammation of the bladder and left renal collecting system. Tumor felt unlikely given the diffuse enhancement. Under distended urinary bladder with circumferential wall thickening. Slightly motion degraded exam without worrisome renal or adrenal mass. Stomach/Bowel: Majority of the colon is under distended limiting  evaluation. No extraluminal bowel inflammatory process. No inflammation surrounds the proximal aspect of the appendix (distal aspect not well delineated). Stomach under distended without gross abnormality noted. No small bowel abnormality detected. Vascular/Lymphatic: Atherosclerotic changes aorta and aortic branch vessels. No abdominal aortic aneurysm or large vessel occlusion. Scattered normal size lymph nodes. Reproductive: Prior hysterectomy.  No worrisome adnexal mass. Other: No free intraperitoneal air or bowel containing hernia. Musculoskeletal: Scoliosis lumbar spine. Transitional vertebra S1. Moderate to marked degenerative changes L3-4 through L5-S1 with prominent spinal   stenosis. IMPRESSION: 1. Under distended urinary bladder with circumferential wall thickening and diffuse mucosal enhancement suggestive of cystitis. Additionally, there is enhancement of the lining of the left ureter and left renal pelvis which also appears inflamed. 2. Evaluation of large portions of bowel limited by under distension. No extraluminal bowel inflammatory process noted. 3. Post cholecystectomy and hysterectomy. 4. Transitional S1 vertebral body. Prominent degenerative changes L3-4 through L5-S1 with significant spinal stenosis. 5.  Aortic Atherosclerosis (ICD10-I70.0). 6. Enlarged liver spanning over 19.2 cm. Electronically Signed   By: Steven  Olson M.D.   On: 04/04/2018 17:37   Dg Knee Complete 4 Views Right  Result Date: 04/04/2018 CLINICAL DATA:  62-year-old female with chronic right knee pain. No reported trauma. Initial encounter. EXAM: RIGHT KNEE - COMPLETE 4+ VIEW COMPARISON:  03/09/2014 MR. FINDINGS: Marked tricompartment degenerative changes. Small to slightly moderate-size suprapatellar joint effusion. No fracture or dislocation. IMPRESSION: 1. Marked tricompartment degenerative changes. 2. Small to slightly moderate-size suprapatellar joint effusion. Electronically Signed   By: Steven  Olson M.D.   On:  04/04/2018 15:35    Pending Labs Unresulted Labs (From admission, onward)    Start     Ordered   04/04/18 1601  Urine culture  STAT,   STAT     04/04/18 1600   Signed and Held  HIV antibody (Routine Testing)  Once,   R     Signed and Held   Signed and Held  Basic metabolic panel  Tomorrow morning,   R     Signed and Held          Vitals/Pain Today's Vitals   04/04/18 1500 04/04/18 1530 04/04/18 1556 04/04/18 1742  BP: (!) 131/94 103/67 103/67 108/68  Pulse: 92 73 73 77  Resp:   18 18  Temp:      TempSrc:      SpO2: 100% 99% 99% 99%  PainSc:        Isolation Precautions No active isolations  Medications Medications  iopamidol (ISOVUE-300) 61 % injection (has no administration in time range)  potassium chloride SA (K-DUR,KLOR-CON) CR tablet 40 mEq (has no administration in time range)  potassium chloride 10 mEq in 100 mL IVPB (0 mEq Intravenous Stopped 04/04/18 1733)  potassium chloride SA (K-DUR,KLOR-CON) CR tablet 40 mEq (40 mEq Oral Given 04/04/18 1612)  cefTRIAXone (ROCEPHIN) 1 g in sodium chloride 0.9 % 100 mL IVPB (0 g Intravenous Stopped 04/04/18 1644)  iopamidol (ISOVUE-300) 61 % injection 100 mL (100 mLs Intravenous Contrast Given 04/04/18 1642)    Mobility manual wheelchair   

## 2018-04-04 NOTE — ED Notes (Signed)
CRITICAL VALUE STICKER  CRITICAL VALUE:  RECEIVER (on-site recipient of call): Rajvi Armentor, RN  DATE & TIME NOTIFIED: 04/04/17 @ 14:52  MESSENGER (representative from lab): Nicholos Johns, [employer]  MD NOTIFIED: Latanya Presser, PA  TIME OF NOTIFICATION: 14:59  RESPONSE: Waiting for orders

## 2018-04-04 NOTE — ED Provider Notes (Signed)
[facility]-EMERGENCY DEPT Provider Note   CSN: [id number] Arrival date & time: 04/04/18  1317     History   Chief Complaint Chief Complaint  Patient presents with  . Weakness  . Knee Pain    HPI [name] is a 62 y.o. female who presents with generalized weakness, knee pain, abdominal pain, dysuria. PMH significant for chronic venous insufficiency, asthma/COPD, gout, GERD, anxiety. The patient states that she has had multiple falls recently because her right knee pain has been worsening. She is supposed to have a knee replacement by Dr. Lajoyce Corners but has difficulty ambulating due to pain and weakness. She is wheelchair bound at home and has been having neighbors come help her. She also states she's been having periumbilical abdominal pain, N/V/D, and dysuria/hematuria/frequency for the past week. She states she called her doctor to have them call in an antibiotic for UTI. She doesn't know what she is taking however. The diarrhea started after taking the antibiotic. Past surgical hx significant for hysterectomy.  HPI  Past Medical History:  Diagnosis Date  . COPD (chronic obstructive pulmonary disease) (HCC)   . Gout   . Hypercholesteremia     Patient Active Problem List   Diagnosis Date Noted  . Venous insufficiency (chronic) (peripheral) 01/11/2018  . BRONCHITIS, ACUTE 10/21/2008  . HYPERTENSION 07/13/2007  . ASTHMA 07/11/2007  . Esophageal reflux 07/11/2007  . SLEEP APNEA 07/11/2007  . SHORTNESS OF BREATH 07/11/2007  . COUGH 07/11/2007    Past Surgical History:  Procedure Laterality Date  . ABDOMINAL HYSTERECTOMY    . ROTATOR CUFF REPAIR       OB History   None      Home Medications    Prior to Admission medications   Medication Sig Start Date End Date Taking? Authorizing Provider  ALPRAZolam Prudy Feeler) 0.5 MG tablet Take 0.5 mg by mouth 3 (three) times daily as needed for anxiety.    [provider]  atorvastatin (LIPITOR) 10 MG  tablet TAKE 1 TAB BY MOUTH EVERY DAY 10/10/17   [provider]  buPROPion (WELLBUTRIN XL) 150 MG 24 hr tablet Take 150 mg by mouth daily.    [provider]  buPROPion (WELLBUTRIN) 100 MG tablet Take 100 mg by mouth 2 (two) times daily.    [provider]  colchicine 0.6 MG tablet Take 0.6 mg by mouth daily.    [provider]  cyclobenzaprine (FLEXERIL) 5 MG tablet TAKE 1 TAB UP TO 3X DAILY AS NEEDED FOR PAIN/SPASM 12/31/17   [provider]  desvenlafaxine (PRISTIQ) 50 MG 24 hr tablet Take 50 mg by mouth daily.    [provider]  DEXILANT 60 MG capsule Take 60 mg by mouth daily. 12/27/17   [provider]  gabapentin (NEURONTIN) 100 MG capsule TAKE ONE CAPSULE BY MOUTH 3 TIMES A DAY AS NEEDED FOR NERVE PAIN 12/26/17   [provider]  HYDROcodone-acetaminophen (NORCO) 10-325 MG tablet Take 1 tablet by mouth every 6 (six) hours as needed. for pain 01/02/18   [provider]  levothyroxine (SYNTHROID, LEVOTHROID) 100 MCG tablet Take 100 mcg by mouth daily before breakfast.    [provider]  pantoprazole (PROTONIX) 40 MG tablet Take 80 mg by mouth daily.    [provider]  QUEtiapine (SEROQUEL) 100 MG tablet TAKE 1 TABLET BY MOUTH EVERYDAY AT BEDTIME 12/12/17   [provider]  SYMBICORT 160-4.5 MCG/ACT inhaler TAKE 2 PUFFS BY MOUTH TWICE A DAY 11/14/17  [provider]    Family History History reviewed. No pertinent family history.  Social History Social History   Tobacco Use  . Smoking status: Current Every Day Smoker    Packs/day: 1.00    Years: 30.00    Pack years: 30.00    Types: Cigarettes  . Smokeless tobacco: Never Used  Substance Use Topics  . Alcohol use: No    Alcohol/week: 0.0 standard drinks  . Drug use: No     Allergies   Patient has no known allergies.   Review of Systems Review of Systems  Constitutional: Negative for chills and fever.    Respiratory: Negative for cough, shortness of breath and wheezing.   Cardiovascular: Negative for chest pain.  Gastrointestinal: Positive for abdominal pain, diarrhea, nausea and vomiting.  Genitourinary: Positive for difficulty urinating, dysuria, frequency and hematuria. Negative for flank pain, pelvic pain and vaginal discharge.  Musculoskeletal: Negative for back pain.  All other systems reviewed and are negative.    Physical Exam Updated Vital Signs BP 112/72   Pulse 80   Temp 98 F (36.7 C) (Oral)   Resp 18   SpO2 96%   Physical Exam  Constitutional: She is oriented to person, place, and time. She appears well-developed and well-nourished. No distress.  Calm, cooperative. Chronically ill appearing. Frequently asking to get up to go to the bathroom  HENT:  Head: Normocephalic and atraumatic.  Eyes: Pupils are equal, round, and reactive to light. Conjunctivae are normal. Right eye exhibits no discharge. Left eye exhibits no discharge. No scleral icterus.  Neck: Normal range of motion.  Cardiovascular: Normal rate and regular rhythm.  Pulmonary/Chest: Effort normal and breath sounds normal. No respiratory distress.  Abdominal: Soft. Bowel sounds are normal. She exhibits no distension and no mass. There is no tenderness. There is no rebound and no guarding. No hernia.  Musculoskeletal:  Right knee: Mild edema. Diffuse tenderness. Limited ROM. No redness. 2+ DP pulse.  Neurological: She is alert and oriented to person, place, and time.  Skin: Skin is warm and dry.  Psychiatric: She has a normal mood and affect. Her behavior is normal.  Nursing note and vitals reviewed.    ED Treatments / Results  Labs (all labs ordered are listed, but only abnormal results are displayed) Labs Reviewed  CBC WITH DIFFERENTIAL/PLATELET - Abnormal; Notable for the following components:      Result Value   RBC 3.66 (*)    MCV 102.5 (*)    RDW 15.6 (*)    All other components within normal  limits  COMPREHENSIVE METABOLIC PANEL - Abnormal; Notable for the following components:   Sodium 146 (*)    Potassium 2.6 (*)    BUN <5 (*)    Calcium 7.7 (*)    Total Protein 5.8 (*)    Albumin 3.0 (*)    AST 11 (*)    All other components within normal limits  URINALYSIS, ROUTINE W REFLEX MICROSCOPIC - Abnormal; Notable for the following components:   APPearance HAZY (*)    Specific Gravity, Urine 1.002 (*)    Hgb urine dipstick MODERATE (*)    Leukocytes, UA SMALL (*)    Bacteria, UA MANY (*)    All other components within normal limits  URINE CULTURE  LIPASE, BLOOD    EKG None  Radiology Ct Abdomen Pelvis W Contrast  Result Date: 04/04/2018 CLINICAL DATA:  62 year old female with generalized weakness. Nausea and lower abdominal tenderness with palpation. Recent diagnosis of UTI.  Prior hysterectomy. Initial encounter. EXAM: CT ABDOMEN AND PELVIS WITH CONTRAST TECHNIQUE: Multidetector CT imaging of the abdomen and pelvis was performed using the standard protocol following bolus administration of intravenous contrast. CONTRAST:  ISOVUE-300 IOPAMIDOL (ISOVUE-300) INJECTION 61% COMPARISON:  04/14/2011 CT. FINDINGS: Lower chest: Minimal scarring lung bases.  Heart size top-normal. Hepatobiliary: Enlarged liver spanning over 19.2 cm. No focal hepatic lesion. Post cholecystectomy. No common bile duct calcified stone noted. Pancreas: No pancreatic mass or inflammation. Spleen: No splenic mass or enlargement. Adrenals/Urinary Tract: No obstructing stone or hydronephrosis. Mucosal enhancement of the left renal collecting system including left renal pelvis and left ureter as well as diffuse circumferential enhancement of the bladder wall suggestive of diffuse inflammation of the bladder and left renal collecting system. Tumor felt unlikely given the diffuse enhancement. Under distended urinary bladder with circumferential wall thickening. Slightly motion degraded exam without worrisome renal  or adrenal mass. Stomach/Bowel: Majority of the colon is under distended limiting evaluation. No extraluminal bowel inflammatory process. No inflammation surrounds the proximal aspect of the appendix (distal aspect not well delineated). Stomach under distended without gross abnormality noted. No small bowel abnormality detected. Vascular/Lymphatic: Atherosclerotic changes aorta and aortic branch vessels. No abdominal aortic aneurysm or large vessel occlusion. Scattered normal size lymph nodes. Reproductive: Prior hysterectomy.  No worrisome adnexal mass. Other: No free intraperitoneal air or bowel containing hernia. Musculoskeletal: Scoliosis lumbar spine. Transitional vertebra S1. Moderate to marked degenerative changes L3-4 through L5-S1 with prominent spinal stenosis. IMPRESSION: 1. Under distended urinary bladder with circumferential wall thickening and diffuse mucosal enhancement suggestive of cystitis. Additionally, there is enhancement of the lining of the left ureter and left renal pelvis which also appears inflamed. 2. Evaluation of large portions of bowel limited by under distension. No extraluminal bowel inflammatory process noted. 3. Post cholecystectomy and hysterectomy. 4. Transitional S1 vertebral body. Prominent degenerative changes L3-4 through L5-S1 with significant spinal stenosis. 5.  Aortic Atherosclerosis (ICD10-I70.0). 6. Enlarged liver spanning over 19.2 cm. Electronically Signed   By: Lacy Duverney M.D.   On: 04/04/2018 17:37   Dg Knee Complete 4 Views Right  Result Date: 04/04/2018 CLINICAL DATA:  62 year old female with chronic right knee pain. No reported trauma. Initial encounter. EXAM: RIGHT KNEE - COMPLETE 4+ VIEW COMPARISON:  03/09/2014 MR. FINDINGS: Marked tricompartment degenerative changes. Small to slightly moderate-size suprapatellar joint effusion. No fracture or dislocation. IMPRESSION: 1. Marked tricompartment degenerative changes. 2. Small to slightly moderate-size  suprapatellar joint effusion. Electronically Signed   By: Lacy Duverney M.D.   On: 04/04/2018 15:35    Procedures Procedures (including critical care time)  Medications Ordered in ED Medications  iopamidol (ISOVUE-300) 61 % injection (has no administration in time range)  potassium chloride 10 mEq in 100 mL IVPB (0 mEq Intravenous Stopped 04/04/18 1733)  potassium chloride SA (K-DUR,KLOR-CON) CR tablet 40 mEq (40 mEq Oral Given 04/04/18 1612)  cefTRIAXone (ROCEPHIN) 1 g in sodium chloride 0.9 % 100 mL IVPB (0 g Intravenous Stopped 04/04/18 1644)  iopamidol (ISOVUE-300) 61 % injection 100 mL (100 mLs Intravenous Contrast Given 04/04/18 1642)     Initial Impression / Assessment and Plan / ED Course  I have reviewed the triage vital signs and the nursing notes.  Pertinent labs & imaging results that were available during my care of the patient were reviewed by me and considered in my medical decision making (see chart for details).  62 year old female presents with acute on chronic right knee pain as well as abdominal  pain, N/V/D, and urinary symptoms. Her vitals are normal. On exam abdomen is soft and minimally tender. Her knee is mildly swollen and diffusely tender. Low suspicion for septic joint. CBC is remarkable for elevated MCV without anemia. CMP is consistent with malnourishment. Her potassium is 2.6, Calcium is 7.7, albumin is 3. She admits to decreased PO intake. UA is consistent with UTI. She has moderate hgb, small leukocytes, many bacteria, 11-20 WBC. Culture was sent. CT abdomen/pelvis concerning for pyelonephritis on the left. Xray of knee shows degenerative changes.  Will request admission due to failure of outpatient abx, hypokalemia, vomiting, and poor social situation.  Final Clinical Impressions(s) / ED Diagnoses   Final diagnoses:  Pyelonephritis  Hypokalemia  Hypocalcemia  Chronic pain of right knee    ED Discharge Orders    None       Bethel Born,  PA-C 04/04/18 1910    Margarita Grizzle, MD 04/06/18 [phone]

## 2018-04-04 NOTE — Care Management Note (Signed)
Case Management Note  Patient Details  Name: [name] MRN: [id number] Date of Birth: 09-13-1955  CM contacted for [facility].  Pt lives home alone and relies on her neighbors for assistance.  [place], PA advised that pt needs knee replacements before she will be able to perform her ADLs independently.  Also advised pt may be admitted but results are still pending.  CM spoke with pt at bedside.  CM asked how soon pt was planning on having her knee surgeries.  She advised Dr. Lajoyce Corners would do the surgery when she lost 20 lbs and stopped smoking.  CM asked her how close she was to reaching those goals.  She stated she was not close at all.  Discussed the benefits of ALFs and provided ALF list in [facility], applying for [facility], and applying for Medicare.  Pt does not have access to the Internet and she does not drive.  She also states she is not interested in leaving her home.  CM encouraged her to remain open to the idea of ALF.  CM discussed HH PT and SW.  Pt chose [facility].  CM contacted Clydie Braun with [facility] who accepted pt for services and is aware of pt's needs.  Claudette Head, PA.  No further CM needs noted at this time.  Expected Discharge Date:   Unknown               Expected Discharge Plan:  Home w Home Health Services  Discharge planning Services  CM Consult  Post Acute Care Choice:  Home Health Choice offered to:  Patient  HH Arranged:  PT, Social Work [facility] Agency:   [facility]  Status of Service:  Completed, signed off  Cletis Clack, Lynnae Sandhoff, RN 04/04/2018, 5:15 PM

## 2018-04-04 NOTE — ED Notes (Signed)
Bed: WA12 Expected date:  Expected time:  Means of arrival:  Comments: EMS/ weak 

## 2018-04-05 DIAGNOSIS — I872 Venous insufficiency (chronic) (peripheral): Secondary | ICD-10-CM

## 2018-04-05 DIAGNOSIS — F329 Major depressive disorder, single episode, unspecified: Secondary | ICD-10-CM

## 2018-04-05 DIAGNOSIS — J45909 Unspecified asthma, uncomplicated: Secondary | ICD-10-CM

## 2018-04-05 DIAGNOSIS — N12 Tubulo-interstitial nephritis, not specified as acute or chronic: Principal | ICD-10-CM

## 2018-04-05 DIAGNOSIS — I10 Essential (primary) hypertension: Secondary | ICD-10-CM

## 2018-04-05 LAB — BASIC METABOLIC PANEL
Anion gap: 8 (ref 5–15)
BUN: <5 mg/dL — ABNORMAL LOW (ref 8–23)
CALCIUM: 8.6 mg/dL — AB (ref 8.9–10.3)
CO2: 27 mmol/L (ref 22–32)
Chloride: 109 mmol/L (ref 98–111)
Creatinine, Ser: 0.67 mg/dL (ref 0.44–1.00)
Glucose, Bld: 89 mg/dL (ref 70–99)
Potassium: 3.9 mmol/L (ref 3.5–5.1)
Sodium: 144 mmol/L (ref 135–145)

## 2018-04-05 LAB — HIV ANTIBODY (ROUTINE TESTING W REFLEX): HIV Screen 4th Generation wRfx: NONREACTIVE

## 2018-04-05 MED ORDER — INFLUENZA VAC SPLIT QUAD 0.5 ML IM SUSY
0.5000 mL | PREFILLED_SYRINGE | INTRAMUSCULAR | Status: AC
  Administered 2018-04-06: 0.5 mL via INTRAMUSCULAR
  Filled 2018-04-05: qty 0.5

## 2018-04-05 MED ORDER — SODIUM CHLORIDE 0.9 % IV SOLN
INTRAVENOUS | Status: DC | PRN
  Administered 2018-04-05: 250 mL via INTRAVENOUS

## 2018-04-05 NOTE — Evaluation (Addendum)
Physical Therapy Evaluation Patient Details Name: [name] MRN: [id number] DOB: 09/08/1955 Today's Date: 04/05/2018   History of Present Illness  62 yo female admitted with pyelonephritis. Hx of chronic R knee joint effusion/OA/pain, chronic venous insufficiency.   Clinical Impression  On eval, pt was Min guard assist for mobility. She walked ~30 feet with a RW. Pt stated she only walks short distances at baseline. Will follow and progress activity as tolerated.     Follow Up Recommendations Supervision - Intermittent    Equipment Recommendations  None recommended by PT    Recommendations for Other Services       Precautions / Restrictions Precautions Precautions: Fall Restrictions Weight Bearing Restrictions: No      Mobility  Bed Mobility Overal bed mobility: Needs Assistance Bed Mobility: Supine to Sit;Sit to Supine     Supine to sit: Supervision Sit to supine: Supervision   General bed mobility comments: for safety. Increased time  Transfers Overall transfer level: Needs assistance Equipment used: Rolling walker (2 wheeled) Transfers: Sit to/from Stand Sit to Stand: Supervision         General transfer comment: for safety. Increased time. Pt prefers to pull up on the walker  Ambulation/Gait Min guard assist Gait Distance (Feet): 30 Feet Assistive device: Rolling walker (2 wheeled) Gait Pattern/deviations: Step-through pattern;Trunk flexed;Decreased stride length     General Gait Details: slow gait speed. close guard for safety.   Stairs            Wheelchair Mobility    Modified Rankin (Stroke Patients Only)       Balance Overall balance assessment: Needs assistance         Standing balance support: Bilateral upper extremity supported Standing balance-Leahy Scale: Poor Standing balance comment: requires RW                             Pertinent Vitals/Pain Pain Assessment: Faces Faces Pain Scale: Hurts even  more Pain Location: back, R knee Pain Descriptors / Indicators: Aching;Sore Pain Intervention(s): Monitored during session;Limited activity within patient's tolerance;Repositioned    Home Living Family/patient expects to be discharged to:: Private residence Living Arrangements: Alone Available Help at Discharge: Family;Available PRN/intermittently Type of Home: Mobile home Home Access: Stairs to enter Entrance Stairs-Rails: Doctor, general practice of Steps: 7 Home Layout: One level Home Equipment: Environmental consultant - 4 wheels      Prior Function Level of Independence: Needs assistance   Gait / Transfers Assistance Needed: uses rollator  ADL's / Homemaking Assistance Needed: gets help with bathing,dressing, household tasks        Hand Dominance        Extremity/Trunk Assessment   Upper Extremity Assessment Upper Extremity Assessment: Generalized weakness    Lower Extremity Assessment Lower Extremity Assessment: Generalized weakness    Cervical / Trunk Assessment Cervical / Trunk Assessment: Kyphotic  Communication   Communication: No difficulties  Cognition Arousal/Alertness: Awake/alert Behavior During Therapy: WFL for tasks assessed/performed Overall Cognitive Status: Within Functional Limits for tasks assessed                                        General Comments      Exercises     Assessment/Plan    PT Assessment Patient needs continued PT services  PT Problem List Decreased mobility;Decreased strength;Decreased activity tolerance;Pain  PT Treatment Interventions DME instruction;Gait training;Functional mobility training;Therapeutic activities;Balance training;Patient/family education;Therapeutic exercise    PT Goals (Current goals can be found in the Care Plan section)  Acute Rehab PT Goals Patient Stated Goal: home PT Goal Formulation: With patient Time For Goal Achievement: 04/19/18 Potential to Achieve Goals: Good     Frequency Min 3X/week   Barriers to discharge        Co-evaluation               AM-PAC PT "6 Clicks" Daily Activity  Outcome Measure Difficulty turning over in bed (including adjusting bedclothes, sheets and blankets)?: A Little Difficulty moving from lying on back to sitting on the side of the bed? : A Little Difficulty sitting down on and standing up from a chair with arms (e.g., wheelchair, bedside commode, etc,.)?: A Little Help needed moving to and from a bed to chair (including a wheelchair)?: A Little Help needed walking in hospital room?: A Little Help needed climbing 3-5 steps with a railing? : A Little 6 Click Score: 18    End of Session Equipment Utilized During Treatment: Gait belt Activity Tolerance: Patient limited by pain;Patient limited by fatigue Patient left: in bed;with call bell/phone within reach;with bed alarm set   PT Visit Diagnosis: Muscle weakness (generalized) (M62.81);Difficulty in walking, not elsewhere classified (R26.2)    Time: 2706-2376 PT Time Calculation (min) (ACUTE ONLY): 18 min   Charges:   PT Evaluation $PT Eval Moderate Complexity: 1 Mod            Rebeca Alert, MPT Pager: [phone]

## 2018-04-05 NOTE — Progress Notes (Signed)
PROGRESS NOTE    [name]  XJO:[id number] DOB: 1956-02-17 DOA: 04/04/2018 PCP: Rodrigo Ran, MD   Brief Narrative:  HPI on 04/04/2018 by Dr. Kennis Carina [name] is a 62 y.o. female with medical history significant Asthma, Depression, chronic venous insufficiency, who presented to the ED with complaints of burning with urination, with frequency and lower abdominal pain of about 1 week duration.  Also reports left flank pain of several days.  Patient denies vomiting but reports poor p.o. intake and diarrhea, previously, but had one episode of loose stool yesterday and none today.  Patient reports she was prescribed an antibiotic, ?name, for UTI after which loose stool started.  Reports chills without fevers. Patient also with chronic right knee pain, she is supposed to have a knee replacement soon, by dr. Lajoyce Corners,.  Reports falls secondary to the knee pain, but ambulates with walker and wheelchair at home. Assessment & Plan   Pyelonephritis/UTI -Patient presented with left flank pain and dysuria -CT suggestive of renal pelvis inflammation and cystitis -UA: Skin appearance, small leukocytes, many bacteria, 11-20 WBC -Urine culture pending  -Continue ceftriaxone and IV fluid -Continue pain control  Hypokalemia -Suspect secondary to poor oral intake as well as GI losses -Resolved, continue to monitor BMP  Diarrhea -Patient states she had diarrhea upon admission however has not had any this morning -Will continue to monitor closely  Depression -Patient should follow-up with prescribing physician as many of her medications appear to be duplicates -Home meds are Seroquel, Abilify, venlafaxine, desvenlafaxine, Wellbutrin, Xanax  Asthma -Stable, Continue home medications as needed  Hypothyroidism -Continue Synthroid  Ambulatory dysfunction -Patient reports falling due to right knee pain which is been chronic.  Supposed to have knee replacement.  She does ambulate with the use  of a walker as well as a wheelchair at home -PT consulted recommending intermittent supervision  DVT Prophylaxis  lovenox  Code Status: Full  Family Communication: None at bedside  Disposition Plan: Admitted.  Suspect patient will need an additional 24 hours of IV antibiotics given her flank pain.  Consultants None  Procedures  None  Antibiotics   Anti-infectives (From admission, onward)   Start     Dose/Rate Route Frequency Ordered Stop   04/05/18 1600  cefTRIAXone (ROCEPHIN) 1 g in sodium chloride 0.9 % 100 mL IVPB     1 g 200 mL/hr over 30 Minutes Intravenous Every 24 hours 04/04/18 2025     04/04/18 1615  cefTRIAXone (ROCEPHIN) 1 g in sodium chloride 0.9 % 100 mL IVPB     1 g 200 mL/hr over 30 Minutes Intravenous  Once 04/04/18 1600 04/04/18 1644      Subjective:   [name] seen and examined today.  Denies further episodes of diarrhea.  Denies current abdominal pain, nausea or vomiting.  Continues to have left flank pain and lower back pain.  States it is improved mildly since admission but continues to use pain medications.  Denies current chest pain, shortness of breath, dizziness or headache.  Objective:   Vitals:   04/05/18 0552 04/05/18 1106 04/05/18 1239 04/05/18 1257  BP: (!) 92/47   115/72  Pulse: 82   79  Resp: 20   18  Temp: 98 F (36.7 C)   98.2 F (36.8 C)  TempSrc: Oral   Oral  SpO2: 94% 96%  95%  Height:   5\' 5"  (1.651 m)     Intake/Output Summary (Last 24 hours) at 04/05/2018 1311 Last data filed at  04/05/2018 1254 Gross per 24 hour  Intake 920 ml  Output -  Net 920 ml   There were no vitals filed for this visit.  Exam  General: Well developed, elderly, no apparent distress  HEENT: NCAT, mucous membranes moist.   Neck: Supple  Cardiovascular: S1 S2 auscultated, no rubs, murmurs or gallops. Regular rate and rhythm.  Respiratory: Clear to auscultation bilaterally with equal chest rise  Abdomen: Soft, obese, mild suprapubic TTP,  nondistended, + bowel sounds  Extremities: warm dry without cyanosis clubbing or edema. Right knee edema and TTP,   Neuro: AAOx3, nonfocal  Psych: Normal affect and demeanor with intact judgement and insight   Data Reviewed: I have personally reviewed following labs and imaging studies  CBC: Recent Labs  Lab 04/04/18 1357  WBC 6.8  NEUTROABS 3.8  HGB 12.3  HCT 37.5  MCV 102.5*  PLT 281   Basic Metabolic Panel: Recent Labs  Lab 04/04/18 1357 04/05/18 0450  NA 146* 144  K 2.6* 3.9  CL 111 109  CO2 26 27  GLUCOSE 85 89  BUN <5* <5*  CREATININE 0.59 0.67  CALCIUM 7.7* 8.6*  MG 1.7  --    GFR: CrCl cannot be calculated (Unknown ideal weight.). Liver Function Tests: Recent Labs  Lab 04/04/18 1357  AST 11*  ALT 9  ALKPHOS 74  BILITOT 0.6  PROT 5.8*  ALBUMIN 3.0*   Recent Labs  Lab 04/04/18 1357  LIPASE 21   No results for input(s): AMMONIA in the last 168 hours. Coagulation Profile: No results for input(s): INR, PROTIME in the last 168 hours. Cardiac Enzymes: No results for input(s): CKTOTAL, CKMB, CKMBINDEX, TROPONINI in the last 168 hours. BNP (last 3 results) No results for input(s): PROBNP in the last 8760 hours. HbA1C: No results for input(s): HGBA1C in the last 72 hours. CBG: No results for input(s): GLUCAP in the last 168 hours. Lipid Profile: No results for input(s): CHOL, HDL, LDLCALC, TRIG, CHOLHDL, LDLDIRECT in the last 72 hours. Thyroid Function Tests: No results for input(s): TSH, T4TOTAL, FREET4, T3FREE, THYROIDAB in the last 72 hours. Anemia Panel: No results for input(s): VITAMINB12, FOLATE, FERRITIN, TIBC, IRON, RETICCTPCT in the last 72 hours. Urine analysis:    Component Value Date/Time   COLORURINE YELLOW 04/04/2018 1439   APPEARANCEUR HAZY (A) 04/04/2018 1439   LABSPEC 1.002 (L) 04/04/2018 1439   PHURINE 8.0 04/04/2018 1439   GLUCOSEU NEGATIVE 04/04/2018 1439   HGBUR MODERATE (A) 04/04/2018 1439   BILIRUBINUR NEGATIVE  04/04/2018 1439   KETONESUR NEGATIVE 04/04/2018 1439   PROTEINUR NEGATIVE 04/04/2018 1439   UROBILINOGEN 0.2 04/14/2011 1651   NITRITE NEGATIVE 04/04/2018 1439   LEUKOCYTESUR SMALL (A) 04/04/2018 1439   Sepsis Labs: @LABRCNTIP (procalcitonin:4,lacticidven:4)  )No results found for this or any previous visit (from the past 240 hour(s)).    Radiology Studies: Ct Abdomen Pelvis W Contrast  Result Date: 04/04/2018 CLINICAL DATA:  62 year old female with generalized weakness. Nausea and lower abdominal tenderness with palpation. Recent diagnosis of UTI. Prior hysterectomy. Initial encounter. EXAM: CT ABDOMEN AND PELVIS WITH CONTRAST TECHNIQUE: Multidetector CT imaging of the abdomen and pelvis was performed using the standard protocol following bolus administration of intravenous contrast. CONTRAST:  ISOVUE-300 IOPAMIDOL (ISOVUE-300) INJECTION 61% COMPARISON:  04/14/2011 CT. FINDINGS: Lower chest: Minimal scarring lung bases.  Heart size top-normal. Hepatobiliary: Enlarged liver spanning over 19.2 cm. No focal hepatic lesion. Post cholecystectomy. No common bile duct calcified stone noted. Pancreas: No pancreatic mass or inflammation. Spleen: No splenic  mass or enlargement. Adrenals/Urinary Tract: No obstructing stone or hydronephrosis. Mucosal enhancement of the left renal collecting system including left renal pelvis and left ureter as well as diffuse circumferential enhancement of the bladder wall suggestive of diffuse inflammation of the bladder and left renal collecting system. Tumor felt unlikely given the diffuse enhancement. Under distended urinary bladder with circumferential wall thickening. Slightly motion degraded exam without worrisome renal or adrenal mass. Stomach/Bowel: Majority of the colon is under distended limiting evaluation. No extraluminal bowel inflammatory process. No inflammation surrounds the proximal aspect of the appendix (distal aspect not well delineated). Stomach under  distended without gross abnormality noted. No small bowel abnormality detected. Vascular/Lymphatic: Atherosclerotic changes aorta and aortic branch vessels. No abdominal aortic aneurysm or large vessel occlusion. Scattered normal size lymph nodes. Reproductive: Prior hysterectomy.  No worrisome adnexal mass. Other: No free intraperitoneal air or bowel containing hernia. Musculoskeletal: Scoliosis lumbar spine. Transitional vertebra S1. Moderate to marked degenerative changes L3-4 through L5-S1 with prominent spinal stenosis. IMPRESSION: 1. Under distended urinary bladder with circumferential wall thickening and diffuse mucosal enhancement suggestive of cystitis. Additionally, there is enhancement of the lining of the left ureter and left renal pelvis which also appears inflamed. 2. Evaluation of large portions of bowel limited by under distension. No extraluminal bowel inflammatory process noted. 3. Post cholecystectomy and hysterectomy. 4. Transitional S1 vertebral body. Prominent degenerative changes L3-4 through L5-S1 with significant spinal stenosis. 5.  Aortic Atherosclerosis (ICD10-I70.0). 6. Enlarged liver spanning over 19.2 cm. Electronically Signed   By: Lacy Duverney M.D.   On: 04/04/2018 17:37   Dg Knee Complete 4 Views Right  Result Date: 04/04/2018 CLINICAL DATA:  62 year old female with chronic right knee pain. No reported trauma. Initial encounter. EXAM: RIGHT KNEE - COMPLETE 4+ VIEW COMPARISON:  03/09/2014 MR. FINDINGS: Marked tricompartment degenerative changes. Small to slightly moderate-size suprapatellar joint effusion. No fracture or dislocation. IMPRESSION: 1. Marked tricompartment degenerative changes. 2. Small to slightly moderate-size suprapatellar joint effusion. Electronically Signed   By: Lacy Duverney M.D.   On: 04/04/2018 15:35     Scheduled Meds: . ALPRAZolam  1 mg Oral TID  . atorvastatin  10 mg Oral Daily  . buPROPion  150 mg Oral QHS  . enoxaparin (LOVENOX) injection  40  mg Subcutaneous Q24H  . gabapentin  100 mg Oral TID  . [START ON 04/06/2018] Influenza vac split quadrivalent PF  0.5 mL Intramuscular Tomorrow-1000  . levothyroxine  125 mcg Oral QAC breakfast  . mometasone-formoterol  2 puff Inhalation BID  . pantoprazole  40 mg Oral Daily  . QUEtiapine  100 mg Oral QHS  . venlafaxine XR  150 mg Oral QHS   Continuous Infusions: . cefTRIAXone (ROCEPHIN)  IV       LOS: 1 day   Time Spent in minutes   30 minutes  Justan Gaede D.O. on 04/05/2018 at 1:11 PM  Between 7am to 7pm - Please see pager noted on [email or web address]  After 7pm go to [email or web address]  And look for the night coverage person covering for me after hours  [facility] Office  [phone]

## 2018-04-06 LAB — BASIC METABOLIC PANEL
Anion gap: 10 (ref 5–15)
BUN: <5 mg/dL — ABNORMAL LOW (ref 8–23)
CALCIUM: 8.9 mg/dL (ref 8.9–10.3)
CHLORIDE: 108 mmol/L (ref 98–111)
CO2: 27 mmol/L (ref 22–32)
CREATININE: 0.73 mg/dL (ref 0.44–1.00)
GFR calc Af Amer: >60 mL/min (ref >60)
GFR calc non Af Amer: >60 mL/min (ref >60)
GLUCOSE: 105 mg/dL — AB (ref 70–99)
Potassium: 3.9 mmol/L (ref 3.5–5.1)
Sodium: 145 mmol/L (ref 135–145)

## 2018-04-06 MED ORDER — CEPHALEXIN 500 MG PO CAPS: 500.0000 mg | ORAL_CAPSULE | Freq: Two times a day (BID) | ORAL | 0 refills | Status: AC

## 2018-04-06 NOTE — Progress Notes (Signed)
Pt states that her family is at home with her, she is not alone.

## 2018-04-06 NOTE — Progress Notes (Signed)
Physical Therapy Treatment Patient Details Name: [name] MRN: [id number] DOB: 1956-02-06 Today's Date: 04/06/2018    History of Present Illness 62 yo female admitted with pyelonephritis. Hx of chronic R knee joint effusion/OA/pain, chronic venous insufficiency.     PT Comments    Walked short distance to/from bathroom with rollator. Assisted pt on/off commode- Min assist for toilet hygiene. Assisted pt with putting on pajamas-Min assist. Pt stated she was going home on today. Will continue to follow during hospital stay.    Follow Up Recommendations  Supervision/Assist - Intermittent     Equipment Recommendations  None recommended by PT    Recommendations for Other Services       Precautions / Restrictions Precautions Precautions: Fall Restrictions Weight Bearing Restrictions: No    Mobility  Bed Mobility Overal bed mobility: Needs Assistance Bed Mobility: Supine to Sit     Supine to sit: Supervision     General bed mobility comments: for safety. Increased time  Transfers Overall transfer level: Needs assistance Equipment used: 4-wheeled walker Transfers: Sit to/from Stand Sit to Stand: Supervision         General transfer comment: for safety. Increased time. Pt prefers to pull up on the walker. Cues for proper hand placement and walker operation  Ambulation/Gait Ambulation/Gait assistance: Min guard Gait Distance (Feet): 15 Feet(x2) Assistive device: 4-wheeled walker Gait Pattern/deviations: Step-through pattern;Trunk flexed;Decreased stride length     General Gait Details: slow gait speed. close guard for safety.    Stairs             Wheelchair Mobility    Modified Rankin (Stroke Patients Only)       Balance                                            Cognition Arousal/Alertness: Awake/alert Behavior During Therapy: WFL for tasks assessed/performed Overall Cognitive Status: Within Functional Limits for tasks  assessed                                        Exercises      General Comments        Pertinent Vitals/Pain Pain Assessment: Faces Faces Pain Scale: Hurts even more Pain Location: back, R knee Pain Descriptors / Indicators: Aching;Sore Pain Intervention(s): Limited activity within patient's tolerance;Monitored during session;Repositioned    Home Living                      Prior Function            PT Goals (current goals can now be found in the care plan section) Progress towards PT goals: Progressing toward goals    Frequency    Min 3X/week      PT Plan Current plan remains appropriate    Co-evaluation              AM-PAC PT "6 Clicks" Daily Activity  Outcome Measure  Difficulty turning over in bed (including adjusting bedclothes, sheets and blankets)?: A Little Difficulty moving from lying on back to sitting on the side of the bed? : A Little Difficulty sitting down on and standing up from a chair with arms (e.g., wheelchair, bedside commode, etc,.)?: A Little Help needed moving to and from a bed to chair (including  a wheelchair)?: A Little Help needed walking in hospital room?: A Little Help needed climbing 3-5 steps with a railing? : A Little 6 Click Score: 18    End of Session   Activity Tolerance: Patient limited by pain;Patient limited by fatigue Patient left: in chair;with call bell/phone within reach;with chair alarm set   PT Visit Diagnosis: Muscle weakness (generalized) (M62.81);Difficulty in walking, not elsewhere classified (R26.2)     Time: 7494-4967 PT Time Calculation (min) (ACUTE ONLY): 21 min  Charges:  $Therapeutic Activity: 8-22 mins                        Rebeca Alert, MPT Pager: [phone]

## 2018-04-06 NOTE — Care Management Note (Signed)
Case Management Note  Patient Details  Name: [name] MRN: [id number] Date of Birth: Mar 26, 1956  Subjective/Objective:                    Action/Plan: Pt will discharge with [facility].   Expected Discharge Date:  04/06/18               Expected Discharge Plan:  Home w Home Health Services  In-House Referral:     Discharge planning Services  CM Consult  Post Acute Care Choice:  Home Health Choice offered to:  Patient  DME Arranged:    DME Agency:     HH Arranged:  PT, Social Work, Charity fundraiser, Nurse's Aide HH Agency:  [facility]  Status of Service:  Completed, signed off  If discussed at [employer], dates discussed:    Additional CommentsGeni Bers, RN 04/06/2018, 11:39 AM

## 2018-04-06 NOTE — Discharge Summary (Addendum)
Physician Discharge Summary  [name] OVF:[id number] DOB: [birth year] DOA: 04/04/2018  PCP: Rodrigo Ran, MD  Admit date: 04/04/2018 Discharge date: 04/06/2018  Time spent: 45 minutes  Recommendations for Outpatient Follow-up:  Patient will be discharged to home with home health services.  Patient will need to follow up with primary care provider within one week of discharge.  Patient should continue medications as prescribed.  Patient should follow a heart healthy diet.   Discharge Diagnoses:  Pyelonephritis/UTI Hypokalemia Diarrhea Depression Asthma Hypothyroidism Ambulatory dysfunction  Discharge Condition: stable  Diet recommendation: heart healthy  Filed Weights   04/05/18 1239  Weight: 96.8 kg    History of present illness:  on 04/04/2018 by Dr. Darrelyn Hillock Loweryis a 62 y.o.femalewith medical history significantAsthma, Depression,chronic venous insufficiency,who presented to the ED with complaints of burning with urination,with frequency and lower abdominal pain of about 1 week duration.Also reports left flank pain ofseveral days.Patient denies vomiting but reports poor p.o. intake anddiarrhea,previously, buthad one episode of loose stool yesterday and none today.Patient reports she was prescribed an antibiotic, ?name,for UTI after which loose stool started.Reports chills without fevers. Patient also with chronic right knee pain,she is supposed to have a knee replacement soon, by dr. Lajoyce Corners,.Reports falls secondary to the knee pain,but ambulates with walker and wheelchair at home.  Hospital Course:  Pyelonephritis/UTI -Patient presented with left flank pain and dysuria -CT suggestive of renal pelvis inflammation and cystitis -UA: Skin appearance, small leukocytes, many bacteria, 11-20 WBC -Urine culture >100K Ecoli  -Was placed on ceftriaxone and IV fluid -Continue pain control -Will discharge keflex 500mg   BID  Hypokalemia -Suspect secondary to poor oral intake as well as GI losses -Resolved  Diarrhea -Patient states she had diarrhea upon admission however has not had any this morning -no further episodes since admission (per patient)  Depression -Patient should follow-up with prescribing physician as many of her medications appear to be duplicates -Home meds are Seroquel, Abilify, venlafaxine, desvenlafaxine, Wellbutrin, Xanax  Asthma -Stable, Continue home medications as needed  Hypothyroidism -Continue Synthroid  Ambulatory dysfunction -Patient reports falling due to right knee pain which is been chronic.  Supposed to have knee replacement.  She does ambulate with the use of a walker as well as a wheelchair at home -R knee xray showed small to mod effusion- patient states she will follow up with Dr. Lajoyce Corners. -PT consulted recommending intermittent supervision  Procedures:  None  Consultations:  None  Discharge Exam: Vitals:   04/06/18 0806 04/06/18 1020  BP: 111/66   Pulse: 66   Resp: 16   Temp: 98 F (36.7 C)   SpO2: 96% 100%     General: Well developed, well nourished, NAD, appears stated age  HEENT: NCAT, mucous membranes moist.  Neck: Supple  Cardiovascular: S1 S2 auscultated, no murmurs, RRR  Respiratory: Clear to auscultation bilaterally with equal chest rise  Abdomen: Soft, obese, mild suprapubic TTP, nondistended, + bowel sounds  Extremities: warm dry without cyanosis clubbing or edema. R knee edema  Neuro: AAOx3, nonfocal  Psych: Appropriate mood and affect   Discharge Instructions Discharge Instructions    Discharge instructions   Complete by:  As directed    Patient will be discharged to home with home health services.  Patient will need to follow up with primary care provider within one week of discharge.  Patient should continue medications as prescribed.  Patient should follow a heart healthy diet.     Allergies as of 04/06/2018    No  Known Allergies     Medication List    STOP taking these medications   ARIPiprazole 15 MG tablet Commonly known as:  ABILIFY   desvenlafaxine 50 MG 24 hr tablet Commonly known as:  PRISTIQ   QUEtiapine 100 MG tablet Commonly known as:  SEROQUEL   traMADol 50 MG tablet Commonly known as:  ULTRAM     TAKE these medications   ALPRAZolam 1 MG tablet Commonly known as:  XANAX Take 1 mg by mouth 3 (three) times daily. What changed:  Another medication with the same name was removed. Continue taking this medication, and follow the directions you see here.   atorvastatin 10 MG tablet Commonly known as:  LIPITOR Take 10 mg by mouth daily.   buPROPion 150 MG 24 hr tablet Commonly known as:  WELLBUTRIN XL Take 150 mg by mouth every evening.   cephALEXin 500 MG capsule Commonly known as:  KEFLEX Take 1 capsule (500 mg total) by mouth 2 (two) times daily for 6 days.   cyclobenzaprine 5 MG tablet Commonly known as:  FLEXERIL Take 5 mg by mouth 3 (three) times daily.   DEXILANT 60 MG capsule Generic drug:  dexlansoprazole Take 60 mg by mouth daily.   furosemide 20 MG tablet Commonly known as:  LASIX Take 20 mg by mouth every other day.   gabapentin 100 MG capsule Commonly known as:  NEURONTIN Take 100 mg by mouth 3 (three) times daily.   HYDROcodone-acetaminophen 10-325 MG tablet Commonly known as:  NORCO Take 1 tablet by mouth every 6 (six) hours as needed for moderate pain. for pain   levothyroxine 125 MCG tablet Commonly known as:  SYNTHROID, LEVOTHROID Take 125 mcg by mouth daily.   nitroGLYCERIN 0.4 MG SL tablet Commonly known as:  NITROSTAT Place 0.4 mg under the tongue every 5 (five) minutes as needed for chest pain.   SYMBICORT 160-4.5 MCG/ACT inhaler Generic drug:  budesonide-formoterol Inhale 2 puffs into the lungs 2 (two) times daily.   venlafaxine XR 150 MG 24 hr capsule Commonly known as:  EFFEXOR-XR Take 150 mg by mouth daily.      No  Known Allergies Follow-up Information    Rodrigo Ran, MD.   Specialty:  Internal Medicine Contact information: [address] [place] [address] [phone]            The results of significant diagnostics from this hospitalization (including imaging, microbiology, ancillary and laboratory) are listed below for reference.    Significant Diagnostic Studies: Ct Abdomen Pelvis W Contrast  Result Date: 04/04/2018 CLINICAL DATA:  62 year old female with generalized weakness. Nausea and lower abdominal tenderness with palpation. Recent diagnosis of UTI. Prior hysterectomy. Initial encounter. EXAM: CT ABDOMEN AND PELVIS WITH CONTRAST TECHNIQUE: Multidetector CT imaging of the abdomen and pelvis was performed using the standard protocol following bolus administration of intravenous contrast. CONTRAST:  ISOVUE-300 IOPAMIDOL (ISOVUE-300) INJECTION 61% COMPARISON:  04/14/2011 CT. FINDINGS: Lower chest: Minimal scarring lung bases.  Heart size top-normal. Hepatobiliary: Enlarged liver spanning over 19.2 cm. No focal hepatic lesion. Post cholecystectomy. No common bile duct calcified stone noted. Pancreas: No pancreatic mass or inflammation. Spleen: No splenic mass or enlargement. Adrenals/Urinary Tract: No obstructing stone or hydronephrosis. Mucosal enhancement of the left renal collecting system including left renal pelvis and left ureter as well as diffuse circumferential enhancement of the bladder wall suggestive of diffuse inflammation of the bladder and left renal collecting system. Tumor felt unlikely given the diffuse enhancement. Under distended urinary bladder with  circumferential wall thickening. Slightly motion degraded exam without worrisome renal or adrenal mass. Stomach/Bowel: Majority of the colon is under distended limiting evaluation. No extraluminal bowel inflammatory process. No inflammation surrounds the proximal aspect of the appendix (distal aspect not well delineated).  Stomach under distended without gross abnormality noted. No small bowel abnormality detected. Vascular/Lymphatic: Atherosclerotic changes aorta and aortic branch vessels. No abdominal aortic aneurysm or large vessel occlusion. Scattered normal size lymph nodes. Reproductive: Prior hysterectomy.  No worrisome adnexal mass. Other: No free intraperitoneal air or bowel containing hernia. Musculoskeletal: Scoliosis lumbar spine. Transitional vertebra S1. Moderate to marked degenerative changes L3-4 through L5-S1 with prominent spinal stenosis. IMPRESSION: 1. Under distended urinary bladder with circumferential wall thickening and diffuse mucosal enhancement suggestive of cystitis. Additionally, there is enhancement of the lining of the left ureter and left renal pelvis which also appears inflamed. 2. Evaluation of large portions of bowel limited by under distension. No extraluminal bowel inflammatory process noted. 3. Post cholecystectomy and hysterectomy. 4. Transitional S1 vertebral body. Prominent degenerative changes L3-4 through L5-S1 with significant spinal stenosis. 5.  Aortic Atherosclerosis (ICD10-I70.0). 6. Enlarged liver spanning over 19.2 cm. Electronically Signed   By: Lacy Duverney M.D.   On: 04/04/2018 17:37   Dg Knee Complete 4 Views Right  Result Date: 04/04/2018 CLINICAL DATA:  62 year old female with chronic right knee pain. No reported trauma. Initial encounter. EXAM: RIGHT KNEE - COMPLETE 4+ VIEW COMPARISON:  03/09/2014 MR. FINDINGS: Marked tricompartment degenerative changes. Small to slightly moderate-size suprapatellar joint effusion. No fracture or dislocation. IMPRESSION: 1. Marked tricompartment degenerative changes. 2. Small to slightly moderate-size suprapatellar joint effusion. Electronically Signed   By: Lacy Duverney M.D.   On: 04/04/2018 15:35    Microbiology: Recent Results (from the past 240 hour(s))  Urine culture     Status: Abnormal (Preliminary result)   Collection Time:  04/04/18  2:39 PM  Result Value Ref Range Status   Specimen Description   Final    URINE, RANDOM Performed at [facility], [address] [place], [place] [address]    Special Requests   Final    NONE Performed at [facility], [address], [place], [place] [address]    Culture >=100,000 COLONIES/mL ESCHERICHIA COLI (A)  Final   Report Status PENDING  Incomplete     Labs: Basic Metabolic Panel: Recent Labs  Lab 04/04/18 1357 04/05/18 0450 04/06/18 0437  NA 146* 144 145  K 2.6* 3.9 3.9  CL 111 109 108  CO2 26 27 27   GLUCOSE 85 89 105*  BUN <5* <5* <5*  CREATININE 0.59 0.67 0.73  CALCIUM 7.7* 8.6* 8.9  MG 1.7  --   --    Liver Function Tests: Recent Labs  Lab 04/04/18 1357  AST 11*  ALT 9  ALKPHOS 74  BILITOT 0.6  PROT 5.8*  ALBUMIN 3.0*   Recent Labs  Lab 04/04/18 1357  LIPASE 21   No results for input(s): AMMONIA in the last 168 hours. CBC: Recent Labs  Lab 04/04/18 1357  WBC 6.8  NEUTROABS 3.8  HGB 12.3  HCT 37.5  MCV 102.5*  PLT 281   Cardiac Enzymes: No results for input(s): CKTOTAL, CKMB, CKMBINDEX, TROPONINI in the last 168 hours. BNP: BNP (last 3 results) No results for input(s): BNP in the last 8760 hours.  ProBNP (last 3 results) No results for input(s): PROBNP in the last 8760 hours.  CBG: No results for input(s): GLUCAP in the last 168  hours.     Signed:  Edsel Petrin  [facility] 04/06/2018, 11:19 AM

## 2018-04-06 NOTE — Progress Notes (Signed)
Patient discharged to home with family, discharge instructions reviewed with patient and family who verbalized understanding. New RX sent electronically to pharmacy.

## 2018-04-06 NOTE — Discharge Instructions (Signed)

## 2018-04-07 LAB — URINE CULTURE: Culture: >=100000 — AB

## 2019-08-22 IMAGING — CR DG KNEE COMPLETE 4+V*R*
5 series · 5 of 5 positions shown · non-contrast
Comparison: 03/09/2014 MR.

CLINICAL DATA: 62-year-old female with chronic right knee pain. No
reported trauma. Initial encounter.

EXAM:
RIGHT KNEE - COMPLETE 4+ VIEW

[x knee ap right (1 of 5)]
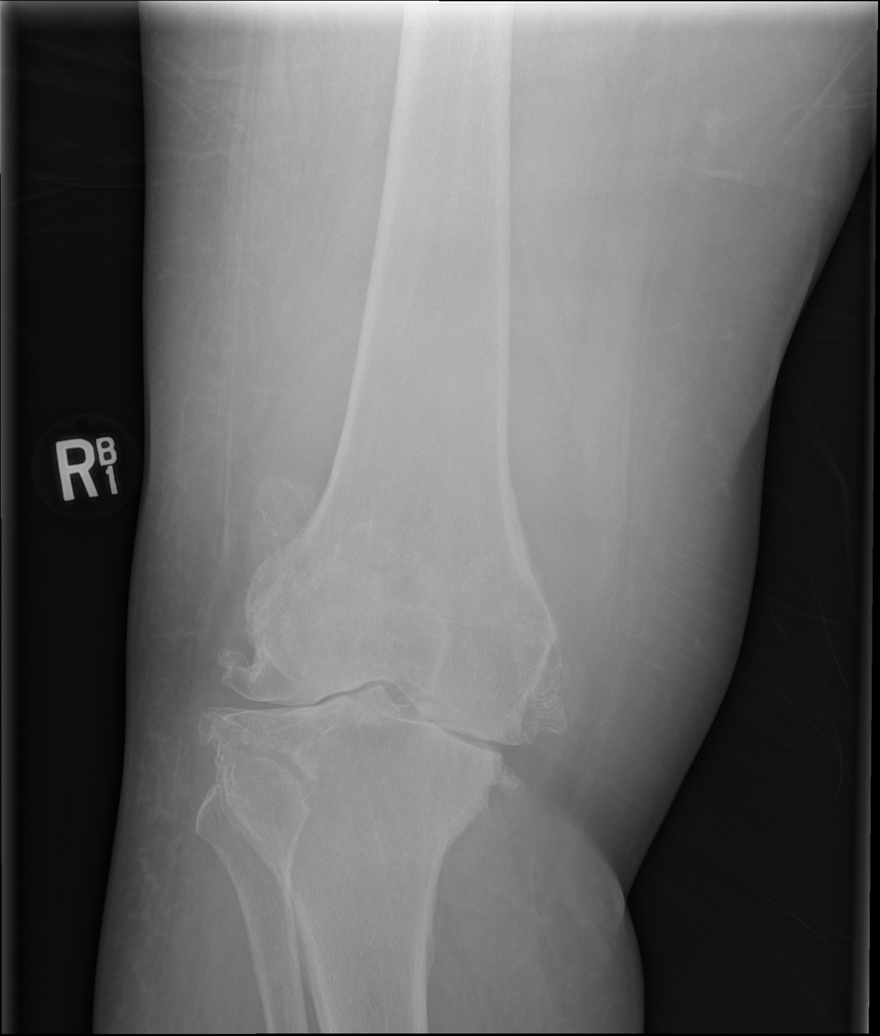

[x knee ap right (2 of 5)]
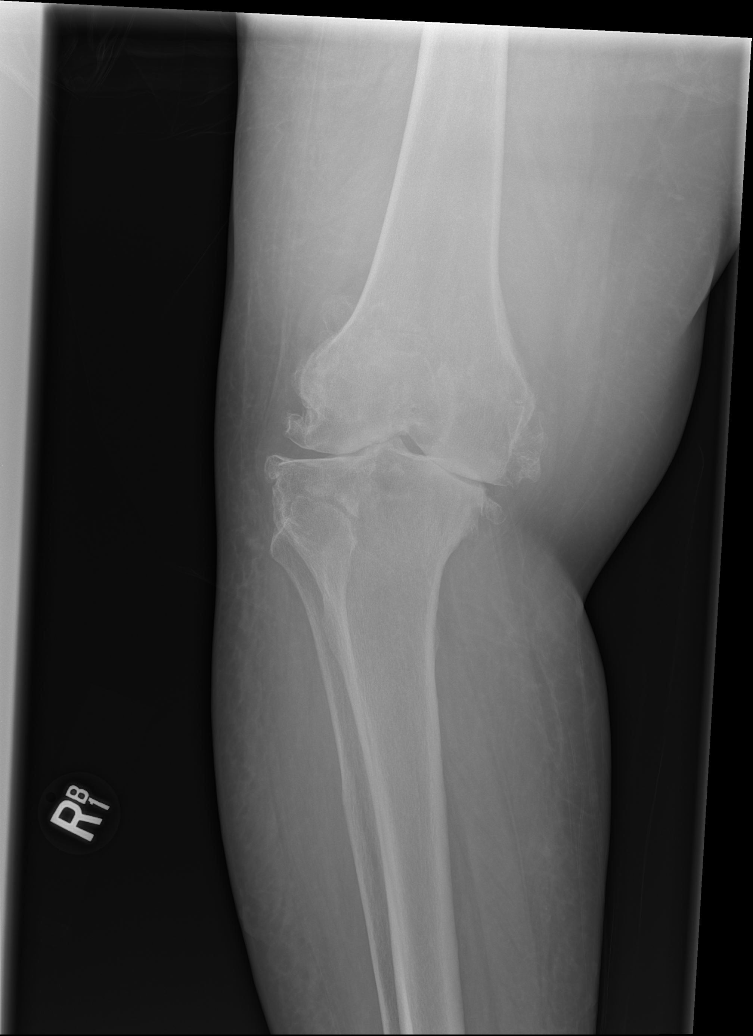

[x knee ap right (3 of 5)]
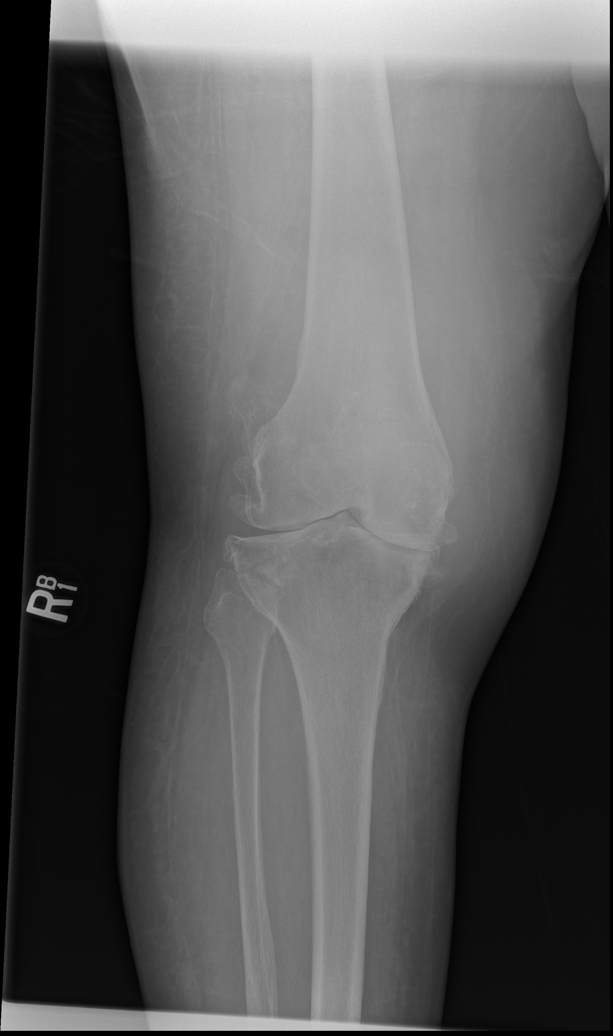

[x knee ap right (4 of 5)]
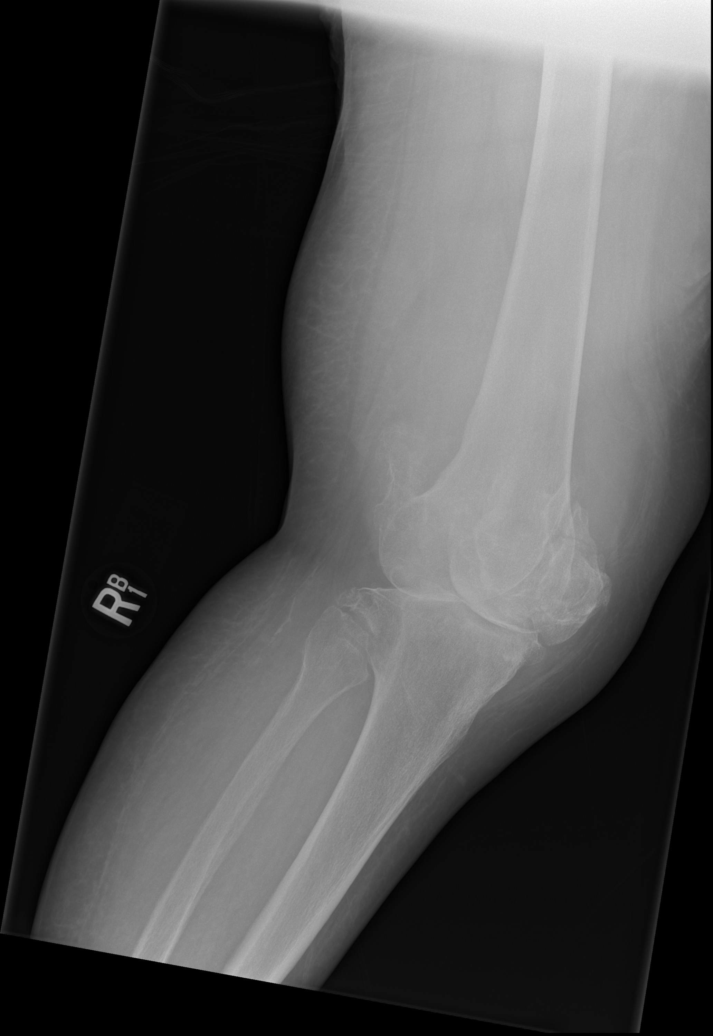

[x knee ap right (5 of 5)]
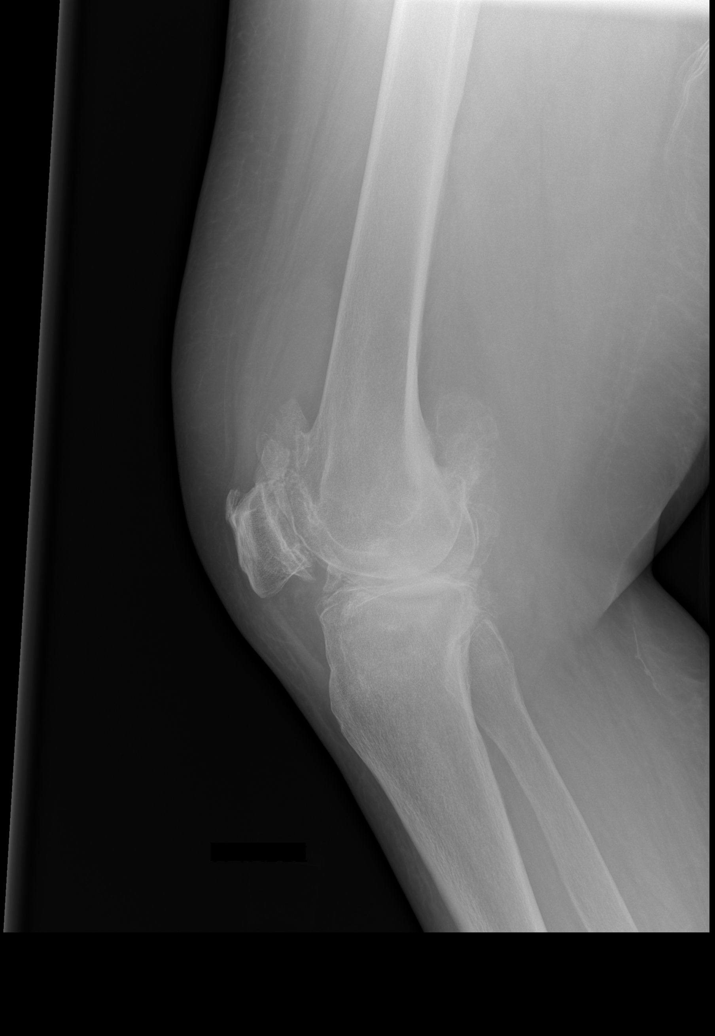

[5 of 5 positions shown; findings below may reference images not displayed]

FINDINGS: Marked tricompartment degenerative changes. Small to slightly
moderate-size suprapatellar joint effusion. No fracture or
dislocation.
IMPRESSION: 1. Marked tricompartment degenerative changes.
2. Small to slightly moderate-size suprapatellar joint effusion.

## 2020-04-28 ENCOUNTER — Encounter: Payer: Self-pay | Admitting: Orthopedic Surgery

## 2020-04-28 ENCOUNTER — Ambulatory Visit (INDEPENDENT_AMBULATORY_CARE_PROVIDER_SITE_OTHER): Payer: Medicaid Other | Admitting: Physician Assistant

## 2020-04-28 VITALS — Ht 65.0 in | Wt 213.0 lb

## 2020-04-28 DIAGNOSIS — I872 Venous insufficiency (chronic) (peripheral): Secondary | ICD-10-CM

## 2020-04-28 MED ORDER — CEPHALEXIN 500 MG PO CAPS: 500.0000 mg | ORAL_CAPSULE | Freq: Four times a day (QID) | ORAL | 0 refills | Status: AC

## 2020-04-28 NOTE — Progress Notes (Signed)
Office Visit Note   Patient: [name]           Date of Birth: 1955-10-19           MRN: [id number] Visit Date: 04/28/2020              Requested by: Rodrigo Ran, MD [address] [place],  [place] [address] PCP: Rodrigo Ran, MD  Chief Complaint  Patient presents with  . Right Leg - Edema, Pain  . Left Leg - Edema, Pain      HPI: This is a pleasant 64 year old woman with a long history of bilateral lower extremity venous stasis disease.  She has had return of her symptoms including swelling and has been unable to tolerate her compression stockings.  She was supposed to begin wraps with home health but they do not have anyone available that can do that right now.  She is taking doxycycline but is causing her significant diarrhea.  Assessment & Plan: Visit Diagnoses: No diagnosis found.  Plan: We will discontinue doxycycline begin Keflex.  We will begin Dynaflex compression wraps today.  I will see her again later this week to rewrap her.  Follow-Up Instructions: No follow-ups on file.   Ortho Exam  Patient is alert, oriented, no adenopathy, well-dressed, normal affect, normal respiratory effort. Lower extremity venous stasis both legs are quite swollen no open ulcers.  She does have some weeping.  Some erythema that goes up to mid calf.  No foul odor.  No ascending cellulitis  Imaging: No results found. No images are attached to the encounter.  Labs: Lab Results  Component Value Date   REPTSTATUS 04/07/2018 FINAL 04/04/2018   CULT >=100,000 COLONIES/mL ESCHERICHIA COLI (A) 04/04/2018   LABORGA ESCHERICHIA COLI (A) 04/04/2018     Lab Results  Component Value Date   ALBUMIN 3.0 (L) 04/04/2018   ALBUMIN 3.8 04/14/2011   ALBUMIN 3.6 03/14/2009    Lab Results  Component Value Date   MG 1.7 04/04/2018   MG 2.4 07/30/2008   Lab Results  Component Value Date   VD25OH  02/27/2009    30 (NOTE) This assay accurately quantifies Vitamin D, which is the sum of  the 25-Hydroxy forms of Vitamin D2 and D3.  Studies have shown that the optimum concentration of 25-Hydroxy Vitamin D is 30 ng/mL or higher.  Concentrations of Vitamin D between 20  and 29 ng/mL are considered to be insufficient and concentrations less than 20 ng/mL are considered to be deficient for Vitamin D.    No results found for: PREALBUMIN CBC EXTENDED Latest Ref Rng & Units 04/04/2018 11/10/2013 04/14/2011  WBC 4.0 - 10.5 K/uL 6.8 9.2 10.8(H)  RBC 3.87 - 5.11 MIL/uL 3.66(L) 4.70 4.55  HGB 12.0 - 15.0 g/dL 39.0 16.0(H) 16.0(H)  HCT 36 - 46 % 37.5 47.6(H) 47.2(H)  PLT 150 - 400 K/uL 281 264 237  NEUTROABS 1.7 - 7.7 K/uL 3.8 - 7.6  LYMPHSABS 0.7 - 4.0 K/uL 2.4 - 2.4     Body mass index is 35.45 kg/m.  Orders:  No orders of the defined types were placed in this encounter.  No orders of the defined types were placed in this encounter.    Procedures: No procedures performed  Clinical Data: No additional findings.  ROS:  All other systems negative, except as noted in the HPI. Review of Systems  Objective: Vital Signs: Ht 5\' 5"  (1.651 m)   Wt 213 lb (96.6 kg)   BMI 35.45 kg/m  Specialty Comments:  No specialty comments available.  PMFS History: Patient Active Problem List   Diagnosis Date Noted  . Pyelonephritis 04/04/2018  . Depression 04/04/2018  . Venous insufficiency (chronic) (peripheral) 01/11/2018  . BRONCHITIS, ACUTE 10/21/2008  . Essential hypertension 07/13/2007  . Asthma 07/11/2007  . Esophageal reflux 07/11/2007  . SLEEP APNEA 07/11/2007  . SHORTNESS OF BREATH 07/11/2007  . COUGH 07/11/2007   Past Medical History:  Diagnosis Date  . COPD (chronic obstructive pulmonary disease) (HCC)   . Gout   . Hypercholesteremia     No family history on file.  Past Surgical History:  Procedure Laterality Date  . ABDOMINAL HYSTERECTOMY    . ROTATOR CUFF REPAIR     Social History   Occupational History  . Not on file  Tobacco Use  . Smoking status:  Current Every Day Smoker    Packs/day: 1.00    Years: 30.00    Pack years: 30.00    Types: Cigarettes  . Smokeless tobacco: Never Used  Substance and Sexual Activity  . Alcohol use: No    Alcohol/week: 0.0 standard drinks  . Drug use: No  . Sexual activity: Not on file

## 2020-04-30 ENCOUNTER — Telehealth: Payer: Self-pay | Admitting: Orthopedic Surgery

## 2020-04-30 NOTE — Telephone Encounter (Signed)
FYI

## 2020-04-30 NOTE — Telephone Encounter (Signed)
Patient called to notify Dr. Lajoyce Corners she will continue her antibiotics. She had to cancel appt cause her nurse which is transportation is on vacation and will call to reschedule next week when nurse is back. Patient phone number is [phone].

## 2020-05-01 ENCOUNTER — Ambulatory Visit: Payer: Medicaid Other | Admitting: Orthopedic Surgery

## 2020-06-19 ENCOUNTER — Emergency Department (HOSPITAL_COMMUNITY)
Admission: EM | Admit: 2020-06-19 | Discharge: 2020-06-19 | Disposition: A | Discharge status: Home / Self Care | Payer: Medicaid Other | Attending: Emergency Medicine | Admitting: Emergency Medicine

## 2020-06-19 ENCOUNTER — Encounter (HOSPITAL_COMMUNITY): Payer: Self-pay

## 2020-06-19 ENCOUNTER — Other Ambulatory Visit: Payer: Self-pay

## 2020-06-19 DIAGNOSIS — L089 Local infection of the skin and subcutaneous tissue, unspecified: Secondary | ICD-10-CM | POA: Diagnosis not present

## 2020-06-19 DIAGNOSIS — R2243 Localized swelling, mass and lump, lower limb, bilateral: Secondary | ICD-10-CM | POA: Insufficient documentation

## 2020-06-19 DIAGNOSIS — J449 Chronic obstructive pulmonary disease, unspecified: Secondary | ICD-10-CM | POA: Insufficient documentation

## 2020-06-19 DIAGNOSIS — M7989 Other specified soft tissue disorders: Secondary | ICD-10-CM

## 2020-06-19 DIAGNOSIS — F17210 Nicotine dependence, cigarettes, uncomplicated: Secondary | ICD-10-CM | POA: Insufficient documentation

## 2020-06-19 DIAGNOSIS — Z7951 Long term (current) use of inhaled steroids: Secondary | ICD-10-CM | POA: Diagnosis not present

## 2020-06-19 DIAGNOSIS — J45909 Unspecified asthma, uncomplicated: Secondary | ICD-10-CM | POA: Insufficient documentation

## 2020-06-19 LAB — CBC WITH DIFFERENTIAL/PLATELET
Abs Immature Granulocytes: 0.03 10*3/uL (ref 0.00–0.07)
Basophils Absolute: 0.1 10*3/uL (ref 0.0–0.1)
Basophils Relative: 2 %
Eosinophils Absolute: 0.2 10*3/uL (ref 0.0–0.5)
Eosinophils Relative: 3 %
HCT: 35.7 % — ABNORMAL LOW (ref 36.0–46.0)
Hemoglobin: 10.9 g/dL — ABNORMAL LOW (ref 12.0–15.0)
Immature Granulocytes: 0 %
Lymphocytes Relative: 29 %
Lymphs Abs: 2.6 10*3/uL (ref 0.7–4.0)
MCH: 27.3 pg (ref 26.0–34.0)
MCHC: 30.5 g/dL (ref 30.0–36.0)
MCV: 89.3 fL (ref 80.0–100.0)
Monocytes Absolute: 0.7 10*3/uL (ref 0.1–1.0)
Monocytes Relative: 8 %
Neutro Abs: 5.1 10*3/uL (ref 1.7–7.7)
Neutrophils Relative %: 58 %
Platelets: 672 10*3/uL — ABNORMAL HIGH (ref 150–400)
RBC: 4 MIL/uL (ref 3.87–5.11)
RDW: 18.7 % — ABNORMAL HIGH (ref 11.5–15.5)
WBC: 8.7 10*3/uL (ref 4.0–10.5)
nRBC: 0 % (ref 0.0–0.2)

## 2020-06-19 LAB — BASIC METABOLIC PANEL
Anion gap: 10 (ref 5–15)
BUN: 7 mg/dL — ABNORMAL LOW (ref 8–23)
CO2: 29 mmol/L (ref 22–32)
Calcium: 8.3 mg/dL — ABNORMAL LOW (ref 8.9–10.3)
Chloride: 98 mmol/L (ref 98–111)
Creatinine, Ser: 0.87 mg/dL (ref 0.44–1.00)
GFR, Estimated: >60 mL/min (ref >60)
Glucose, Bld: 85 mg/dL (ref 70–99)
Potassium: 3.9 mmol/L (ref 3.5–5.1)
Sodium: 137 mmol/L (ref 135–145)

## 2020-06-19 MED ORDER — HYDROCODONE-ACETAMINOPHEN 5-325 MG PO TABS
2.0000 | ORAL_TABLET | Freq: Once | ORAL | Status: AC
  Administered 2020-06-19: 2 via ORAL
  Filled 2020-06-19: qty 2

## 2020-06-19 NOTE — ED Notes (Signed)
Patient hooked up to purewick.

## 2020-06-19 NOTE — Discharge Instructions (Addendum)
It is important to call Dr. Lajoyce Corners for follow-up appointment about the leg swelling and redness.  We are advising that you change bandages daily, and will have home health help you with that.  Take your usual medications.  Elevate your legs above your heart is much as possible.

## 2020-06-19 NOTE — ED Triage Notes (Signed)
Pt BIB EMS from home. Pt has open wounds on bilateral legs for a few months now. PCP prescribed doxycycline and recommended going to ER. A&O x4. Pt ambulatory.

## 2020-06-19 NOTE — ED Provider Notes (Signed)
[facility]-EMERGENCY DEPT Provider Note   CSN: [id number] Arrival date & time: 06/19/20  1806     History Chief Complaint  Patient presents with  . Wound Infection  . Leg Pain    [name] is a 64 y.o. female.  HPI   She presents for worsening lower leg edema and discoloration, for several weeks.  She states that she recently saw an orthopedist and he applied a wrap which she left on for 4 days, but she continued to have the problem when she removed it.  She has not followed back up with the orthopedist.  She states she saw her PCP today who referred her to the ED.  She cannot recall being on antibiotics recently.  She complains of pain in her legs with walking.  She states she can "walk just a little bit."  She denies fever, chills, shortness of breath, cough, chest pain, weakness or dizziness.  There are no other known modifying factors.  Past Medical History:  Diagnosis Date  . COPD (chronic obstructive pulmonary disease) (HCC)   . Gout   . Hypercholesteremia     Patient Active Problem List   Diagnosis Date Noted  . Pyelonephritis 04/04/2018  . Depression 04/04/2018  . Venous insufficiency (chronic) (peripheral) 01/11/2018  . BRONCHITIS, ACUTE 10/21/2008  . Essential hypertension 07/13/2007  . Asthma 07/11/2007  . Esophageal reflux 07/11/2007  . SLEEP APNEA 07/11/2007  . SHORTNESS OF BREATH 07/11/2007  . COUGH 07/11/2007    Past Surgical History:  Procedure Laterality Date  . ABDOMINAL HYSTERECTOMY    . ROTATOR CUFF REPAIR       OB History   No obstetric history on file.     History reviewed. No pertinent family history.  Social History   Tobacco Use  . Smoking status: Current Every Day Smoker    Packs/day: 1.00    Years: 30.00    Pack years: 30.00    Types: Cigarettes  . Smokeless tobacco: Never Used  Substance Use Topics  . Alcohol use: No    Alcohol/week: 0.0 standard drinks  . Drug use: No    Home  Medications Prior to Admission medications   Medication Sig Start Date End Date Taking? Authorizing Provider  ALPRAZolam Prudy Feeler) 1 MG tablet Take 1 mg by mouth 3 (three) times daily. 03/22/18   [provider]  atorvastatin (LIPITOR) 10 MG tablet Take 10 mg by mouth daily.  10/10/17   [provider]  buPROPion (WELLBUTRIN XL) 150 MG 24 hr tablet Take 150 mg by mouth every evening.     [provider]  cyclobenzaprine (FLEXERIL) 5 MG tablet Take 5 mg by mouth 3 (three) times daily.  12/31/17   [provider]  DEXILANT 60 MG capsule Take 60 mg by mouth daily. 12/27/17   [provider]  furosemide (LASIX) 20 MG tablet Take 20 mg by mouth every other day. 02/07/18   [provider]  gabapentin (NEURONTIN) 100 MG capsule Take 100 mg by mouth 3 (three) times daily.  12/26/17   [provider]  HYDROcodone-acetaminophen (NORCO) 10-325 MG tablet Take 1 tablet by mouth every 6 (six) hours as needed for moderate pain. for pain 01/02/18   [provider]  levothyroxine (SYNTHROID, LEVOTHROID) 125 MCG tablet Take 125 mcg by mouth daily. 03/03/18   [provider]  nitroGLYCERIN (NITROSTAT) 0.4 MG SL tablet Place 0.4 mg under the tongue every 5 (five) minutes as needed for chest pain.  [provider]  SYMBICORT 160-4.5 MCG/ACT inhaler Inhale 2 puffs into the lungs 2 (two) times daily.  11/14/17   [provider]  venlafaxine XR (EFFEXOR-XR) 150 MG 24 hr capsule Take 150 mg by mouth daily. 02/24/18   [provider]    Allergies    Patient has no known allergies.  Review of Systems   Review of Systems  All other systems reviewed and are negative.   Physical Exam Updated Vital Signs BP (!) 97/44   Pulse 83   Temp 97.6 F (36.4 C)   Resp 13   SpO2 98%   Physical Exam Vitals and nursing note reviewed.  Constitutional:      General: She is not in acute distress.    Appearance: She is  well-developed. She is not ill-appearing, toxic-appearing or diaphoretic.  HENT:     Head: Normocephalic and atraumatic.     Right Ear: External ear normal.     Left Ear: External ear normal.  Eyes:     Conjunctiva/sclera: Conjunctivae normal.     Pupils: Pupils are equal, round, and reactive to light.  Neck:     Trachea: Phonation normal.  Cardiovascular:     Rate and Rhythm: Normal rate.  Pulmonary:     Effort: Pulmonary effort is normal. No respiratory distress.     Breath sounds: No stridor.  Abdominal:     General: There is no distension.     Palpations: Abdomen is soft.     Tenderness: There is no abdominal tenderness.  Musculoskeletal:        General: Swelling and tenderness present. Normal range of motion.     Cervical back: Normal range of motion and neck supple.     Comments: Lower legs are tender and swollen bilaterally.  They have erythema and some sloughing of old skin material.  There are no areas of ulceration, or drainage at this time.  Skin:    General: Skin is warm and dry.  Neurological:     Mental Status: She is alert and oriented to person, place, and time.     Cranial Nerves: No cranial nerve deficit.     Sensory: No sensory deficit.     Motor: No abnormal muscle tone.     Coordination: Coordination normal.  Psychiatric:        Mood and Affect: Mood normal.        Behavior: Behavior normal.        Thought Content: Thought content normal.        Judgment: Judgment normal.       ED Results / Procedures / Treatments   Labs (all labs ordered are listed, but only abnormal results are displayed) Labs Reviewed  BASIC METABOLIC PANEL - Abnormal; Notable for the following components:      Result Value   BUN 7 (*)    Calcium 8.3 (*)    All other components within normal limits  CBC WITH DIFFERENTIAL/PLATELET - Abnormal; Notable for the following components:   Hemoglobin 10.9 (*)    HCT 35.7 (*)    RDW 18.7 (*)    Platelets 672 (*)    All other  components within normal limits    EKG None  Radiology No results found.  Procedures Procedures (including critical care time)  Medications Ordered in ED Medications  HYDROcodone-acetaminophen (NORCO/VICODIN) 5-325 MG per tablet 2 tablet (2 tablets Oral Given 06/19/20 2012)    ED Course  I have reviewed the triage vital signs and  the nursing notes.  Pertinent labs & imaging results that were available during my care of the patient were reviewed by me and considered in my medical decision making (see chart for details).  Clinical Course as of Jun 19 2256  Thu Jun 19, 2020  1955 Normal except hemoglobin low, platelets high  CBC with Differential(!) [EW]  1955 Normal  Basic metabolic panel(!) [EW]    Clinical Course User Index [EW] Mancel Bale, MD   MDM Rules/Calculators/A&P                           Patient Vitals for the past 24 hrs:  BP Temp Temp src Pulse Resp SpO2  06/19/20 2037 -- 97.6 F (36.4 C) -- -- -- --  06/19/20 2030 (!) 97/44 -- -- 83 13 98 %  06/19/20 2000 (!) 102/33 -- -- 79 13 97 %  06/19/20 1945 95/62 -- -- 79 16 97 %  06/19/20 1915 91/60 -- -- (!) 104 18 93 %  06/19/20 1831 103/61 97.7 F (36.5 C) Oral 84 17 96 %    At discharge- reevaluation with update and discussion. After initial assessment and treatment, an updated evaluation reveals no change in status, findings discussed with the patient and all questions were answered. Mancel Bale   Medical Decision Making:  This patient is presenting for evaluation of swelling of the legs, which does require a range of treatment options, and is a complaint that involves a moderate risk of morbidity and mortality. The differential diagnoses include cellulitis, peripheral edema, peripheral wounds. I decided to review old records, and in summary elderly female with chronic lower leg swelling, partially treated, recently but has persistent symptoms..  I did not require additional historical information  from anyone.  Clinical Laboratory Tests Ordered, included CBC and Metabolic panel. Review indicates reassuring findings with slight anemia, otherwise normal..    Critical Interventions-clinical evaluation, laboratory testing, extremity wraps applied by nursing  After These Interventions, the Patient was reevaluated and was found stable for discharge.  Patient with chronic ongoing peripheral edema, erythema and skin changes secondary to swelling.  No overt infection, cellulitis or deep tissue wounds requiring intervention.  CRITICAL CARE-no Performed by: Mancel Bale  Nursing Notes Reviewed/ Care Coordinated Applicable Imaging Reviewed Interpretation of Laboratory Data incorporated into ED treatment  The patient appears reasonably screened and/or stabilized for discharge and I doubt any other medical condition or other [facility] requiring further screening, evaluation, or treatment in the ED at this time prior to discharge.  Plan: Home Medications- Continue current; Home Treatments-daily wrap changes, per home health; return here if the recommended treatment, does not improve the symptoms; Recommended follow up-foot/leg orthopedic follow-up as soon as possible     Final Clinical Impression(s) / ED Diagnoses Final diagnoses:  Leg swelling    Rx / DC Orders ED Discharge Orders    None       Mancel Bale, MD 06/19/20 2300

## 2020-06-19 NOTE — ED Notes (Signed)
Sent blue, lt green, purple, and one set of blood cultures sent to lab.

## 2020-06-19 NOTE — ED Notes (Signed)
Assumed care of patient at this time, nad noted, sr up x2, bed locked and low, call bell w/I reach.  Will continue to monitor. ° °

## 2020-06-19 NOTE — ED Notes (Signed)
B/L LE has been wrapped with wet to dry 4x4's and kerlix, with NS, per MD order.

## 2020-06-30 ENCOUNTER — Telehealth: Payer: Self-pay | Admitting: Orthopedic Surgery

## 2020-06-30 NOTE — Telephone Encounter (Signed)
Can you please call pt and tell her we need to see her this week. Tomorrow preferably. Can be with Denny Peon

## 2020-06-30 NOTE — Telephone Encounter (Signed)
Victorino Dike with home health called stating the pt saw NP lindsey this morning and had swelling of both lower extremities and the pt is also showing venous dermatis as well as cellulitis so Mardella Layman believes the pt would benefit from being seen in office and if Dr.Duda concurs she would like this set up  [place] CB# [phone]

## 2020-07-01 ENCOUNTER — Ambulatory Visit: Payer: Medicaid Other | Admitting: Family

## 2020-07-02 NOTE — Telephone Encounter (Signed)
Left VM 2

## 2020-07-10 ENCOUNTER — Ambulatory Visit (INDEPENDENT_AMBULATORY_CARE_PROVIDER_SITE_OTHER): Payer: Medicaid Other | Admitting: Orthopedic Surgery

## 2020-07-10 DIAGNOSIS — L97919 Non-pressure chronic ulcer of unspecified part of right lower leg with unspecified severity: Secondary | ICD-10-CM | POA: Diagnosis not present

## 2020-07-10 DIAGNOSIS — I87333 Chronic venous hypertension (idiopathic) with ulcer and inflammation of bilateral lower extremity: Secondary | ICD-10-CM

## 2020-07-10 DIAGNOSIS — I872 Venous insufficiency (chronic) (peripheral): Secondary | ICD-10-CM | POA: Diagnosis not present

## 2020-07-10 DIAGNOSIS — L97929 Non-pressure chronic ulcer of unspecified part of left lower leg with unspecified severity: Secondary | ICD-10-CM

## 2020-07-11 ENCOUNTER — Encounter: Payer: Self-pay | Admitting: Orthopedic Surgery

## 2020-07-11 ENCOUNTER — Telehealth: Payer: Self-pay

## 2020-07-11 NOTE — Progress Notes (Signed)
Office Visit Note   Patient: [name]           Date of Birth: June 16, 1956           MRN: [id number] Visit Date: 07/10/2020              Requested by: Rodrigo Ran, MD [address] [place],  [place] [address] PCP: Rodrigo Ran, MD  Chief Complaint  Patient presents with  . Right Leg - Pain  . Left Leg - Pain      HPI: Patient is a 64 year old woman who presents in follow-up for venous insufficiency both legs with ulceration swelling and drainage.  Patient underwent compression wrap she remove the wraps and went to the emergency room on November 18.  A dressing was not applied at that time.  Patient states that she has remove the wraps on her own.  She complains of her legs sticking with drainage.  Assessment & Plan: Visit Diagnoses:  1. Venous insufficiency (chronic) (peripheral)   2. 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  Discussed that with not being compliant she is at risk for transtibial amputation.  Also discussed the importance of smoking cessation.   Follow-Up Instructions: Return in about 4 weeks (around 08/07/2020).   Ortho Exam  Patient is alert, oriented, no adenopathy, well-dressed, normal affect, normal respiratory effort.  She is still smoking.  Examination patient has massive swelling of both lower extremities with large ulcers there is odor and drainage.  Patient states she is on Neurontin and Lasix she is just completed antibiotics and did have diarrhea while taking the antibiotics.   Imaging: No results found. No images are attached to the encounter.  Labs: Lab Results  Component Value Date   REPTSTATUS 04/07/2018 FINAL 04/04/2018   CULT >=100,000 COLONIES/mL ESCHERICHIA COLI (A) 04/04/2018   LABORGA ESCHERICHIA COLI (A) 04/04/2018     Lab Results  Component Value Date   ALBUMIN 3.0 (L) 04/04/2018    ALBUMIN 3.8 04/14/2011   ALBUMIN 3.6 03/14/2009    Lab Results  Component Value Date   MG 1.7 04/04/2018   MG 2.4 07/30/2008   Lab Results  Component Value Date   VD25OH  02/27/2009    30 (NOTE) This assay accurately quantifies Vitamin D, which is the sum of the 25-Hydroxy forms of Vitamin D2 and D3.  Studies have shown that the optimum concentration of 25-Hydroxy Vitamin D is 30 ng/mL or higher.  Concentrations of Vitamin D between 20  and 29 ng/mL are considered to be insufficient and concentrations less than 20 ng/mL are considered to be deficient for Vitamin D.    No results found for: PREALBUMIN CBC EXTENDED Latest Ref Rng & Units 06/19/2020 04/04/2018 11/10/2013  WBC 4.0 - 10.5 K/uL 8.7 6.8 9.2  RBC 3.87 - 5.11 MIL/uL 4.00 3.66(L) 4.70  HGB 12.0 - 15.0 g/dL 10.9(L) 12.3 16.0(H)  HCT 36.0 - 46.0 % 35.7(L) 37.5 47.6(H)  PLT 150 - 400 K/uL 672(H) 281 264  NEUTROABS 1.7 - 7.7 K/uL 5.1 3.8 -  LYMPHSABS 0.7 - 4.0 K/uL 2.6 2.4 -     There is no height or weight on file to calculate BMI.  Orders:  No orders of the defined types were placed in this encounter.  No orders of the defined types were placed in this encounter.    Procedures: No procedures performed  Clinical Data: No additional findings.  ROS:  All other systems negative, except as noted in the HPI. Review of Systems  Objective: Vital Signs: There were no vitals taken for this visit.  Specialty Comments:  No specialty comments available.  PMFS History: Patient Active Problem List   Diagnosis Date Noted  . Pyelonephritis 04/04/2018  . Depression 04/04/2018  . Venous insufficiency (chronic) (peripheral) 01/11/2018  . BRONCHITIS, ACUTE 10/21/2008  . Essential hypertension 07/13/2007  . Asthma 07/11/2007  . Esophageal reflux 07/11/2007  . SLEEP APNEA 07/11/2007  . SHORTNESS OF BREATH 07/11/2007  . COUGH 07/11/2007   Past Medical History:  Diagnosis Date  . COPD (chronic obstructive pulmonary  disease) (HCC)   . Gout   . Hypercholesteremia     History reviewed. No pertinent family history.  Past Surgical History:  Procedure Laterality Date  . ABDOMINAL HYSTERECTOMY    . ROTATOR CUFF REPAIR     Social History   Occupational History  . Not on file  Tobacco Use  . Smoking status: Current Every Day Smoker    Packs/day: 1.00    Years: 30.00    Pack years: 30.00    Types: Cigarettes  . Smokeless tobacco: Never Used  Substance and Sexual Activity  . Alcohol use: No    Alcohol/week: 0.0 standard drinks  . Drug use: No  . Sexual activity: Not on file

## 2020-07-11 NOTE — Telephone Encounter (Signed)
complete

## 2020-07-11 NOTE — Telephone Encounter (Signed)
Called [facility] to see if they are able to staff this referral for 6 weeks of HHN compression dressing application and they are not able to at the moment. Was advised to check around and if I am unsuccessful with getting the pt set up that they may be able to out source it to another branch. I have contacted [facility] and they are not able to take the referral. Will reach out to [facility]. Holding this referral pending

## 2020-07-11 NOTE — Telephone Encounter (Signed)
Can you please dictate the office note for this pt? I need that note to fax to a home health agency to see if they will accept the referral for compression wraps.

## 2020-07-14 ENCOUNTER — Other Ambulatory Visit: Payer: Self-pay

## 2020-07-14 ENCOUNTER — Telehealth: Payer: Self-pay | Admitting: Orthopedic Surgery

## 2020-07-14 NOTE — Telephone Encounter (Signed)
Pt called stating she is suppose to have a home health nurse come out and she hasn't heard anything.   Pt is asking if her wrap can be taken off in the morning.

## 2020-07-14 NOTE — Telephone Encounter (Signed)
I called pt and advised that I am still working on getting a HHN to come out to the house. The referral was declined due to staffing by two agencies and I am awaiting decision from the third. To keep appt on 07/17/20 and we will remove the dressings in the office and she should continue to elevate her feet higher than her heart. If she has any questions to call the office otherwise we will see her on Thursday.

## 2020-07-14 NOTE — Telephone Encounter (Signed)
Faxed office visit and demo sheet  to [facility] [phone] to see if they will accept referral for BLE compression wraps to be changed once a week for 6 weeks. Will hold message pending approval.

## 2020-07-14 NOTE — Telephone Encounter (Signed)
Correction fax number is [phone] the number listed below is the office number.

## 2020-07-15 NOTE — Telephone Encounter (Signed)
Called and lm on vm to question if referral has been received and if they will be able to staff the request. Asked for a return call to advise. Will continue to hold message until I hear back.

## 2020-07-16 NOTE — Telephone Encounter (Signed)
Called and lm on vm for [facility] to ask if they could call me back and at least confirm they received the order for [facility]. If I have not heard back by tomorrow I will try [facility]

## 2020-07-17 ENCOUNTER — Ambulatory Visit (INDEPENDENT_AMBULATORY_CARE_PROVIDER_SITE_OTHER): Payer: Medicaid Other | Admitting: Orthopedic Surgery

## 2020-07-17 ENCOUNTER — Encounter: Payer: Self-pay | Admitting: Orthopedic Surgery

## 2020-07-17 DIAGNOSIS — L97929 Non-pressure chronic ulcer of unspecified part of left lower leg with unspecified severity: Secondary | ICD-10-CM

## 2020-07-17 DIAGNOSIS — L97919 Non-pressure chronic ulcer of unspecified part of right lower leg with unspecified severity: Secondary | ICD-10-CM | POA: Diagnosis not present

## 2020-07-17 DIAGNOSIS — I87333 Chronic venous hypertension (idiopathic) with ulcer and inflammation of bilateral lower extremity: Secondary | ICD-10-CM | POA: Diagnosis not present

## 2020-07-17 NOTE — Telephone Encounter (Signed)
Pt was seen in the office today and dressings removed. She has decreased swelling and Dr.Duda advised ok to use compression socks. Advised pt that if there was not a dressing application need then [facility] would not be approved by [employer]. Pt voiced understanding and will call with any questions.

## 2020-07-17 NOTE — Progress Notes (Signed)
Office Visit Note   Patient: [name]           Date of Birth: Dec 13, 1955           MRN: [id number] Visit Date: 07/17/2020              Requested by: Rodrigo Ran, MD [address] [place],  [place] [address] PCP: Rodrigo Ran, MD  Chief Complaint  Patient presents with  . Left Leg - Follow-up  . Right Leg - Follow-up    Bilateral profore dressing 1 week follow up       HPI: Patient is a 64 year old woman who presents in follow-up for venous stasis swelling and ulceration both legs she is currently in a 4 layer compression wrap bilaterally.  Patient states the legs have an odor.  Assessment & Plan: Visit Diagnoses:  1. 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  Recommend that she resume with her knee-high medical compression stockings wear these around-the-clock change daily and wash the legs daily with soap and water.  Follow-Up Instructions: Return in about 4 weeks (around 08/14/2020).   Ortho Exam  Patient is alert, oriented, no adenopathy, well-dressed, normal affect, normal respiratory effort. Examination patient has significant reduction in the swelling in both legs the skin is wrinkling now she still has dermatitis there is no cellulitis the ulcers on both legs have flat granulation tissue with a small amount of bleeding.  There is no signs of infection in either leg.  Imaging: No results found. No images are attached to the encounter.  Labs: Lab Results  Component Value Date   REPTSTATUS 04/07/2018 FINAL 04/04/2018   CULT >=100,000 COLONIES/mL ESCHERICHIA COLI (A) 04/04/2018   LABORGA ESCHERICHIA COLI (A) 04/04/2018     Lab Results  Component Value Date   ALBUMIN 3.0 (L) 04/04/2018   ALBUMIN 3.8 04/14/2011   ALBUMIN 3.6 03/14/2009    Lab Results  Component Value Date   MG 1.7 04/04/2018   MG 2.4  07/30/2008   Lab Results  Component Value Date   VD25OH  02/27/2009    30 (NOTE) This assay accurately quantifies Vitamin D, which is the sum of the 25-Hydroxy forms of Vitamin D2 and D3.  Studies have shown that the optimum concentration of 25-Hydroxy Vitamin D is 30 ng/mL or higher.  Concentrations of Vitamin D between 20  and 29 ng/mL are considered to be insufficient and concentrations less than 20 ng/mL are considered to be deficient for Vitamin D.    No results found for: PREALBUMIN CBC EXTENDED Latest Ref Rng & Units 06/19/2020 04/04/2018 11/10/2013  WBC 4.0 - 10.5 K/uL 8.7 6.8 9.2  RBC 3.87 - 5.11 MIL/uL 4.00 3.66(L) 4.70  HGB 12.0 - 15.0 g/dL 10.9(L) 12.3 16.0(H)  HCT 36.0 - 46.0 % 35.7(L) 37.5 47.6(H)  PLT 150 - 400 K/uL 672(H) 281 264  NEUTROABS 1.7 - 7.7 K/uL 5.1 3.8 -  LYMPHSABS 0.7 - 4.0 K/uL 2.6 2.4 -     There is no height or weight on file to calculate BMI.  Orders:  No orders of the defined types were placed in this encounter.  No orders of the defined types were placed in this encounter.    Procedures: No procedures performed  Clinical Data: No additional findings.  ROS:  All other systems negative, except as noted in the HPI.  Review of Systems  Objective: Vital Signs: There were no vitals taken for this visit.  Specialty Comments:  No specialty comments available.  PMFS History: Patient Active Problem List   Diagnosis Date Noted  . Pyelonephritis 04/04/2018  . Depression 04/04/2018  . Venous insufficiency (chronic) (peripheral) 01/11/2018  . BRONCHITIS, ACUTE 10/21/2008  . Essential hypertension 07/13/2007  . Asthma 07/11/2007  . Esophageal reflux 07/11/2007  . SLEEP APNEA 07/11/2007  . SHORTNESS OF BREATH 07/11/2007  . COUGH 07/11/2007   Past Medical History:  Diagnosis Date  . COPD (chronic obstructive pulmonary disease) (HCC)   . Gout   . Hypercholesteremia     History reviewed. No pertinent family history.  Past Surgical  History:  Procedure Laterality Date  . ABDOMINAL HYSTERECTOMY    . ROTATOR CUFF REPAIR     Social History   Occupational History  . Not on file  Tobacco Use  . Smoking status: Current Every Day Smoker    Packs/day: 1.00    Years: 30.00    Pack years: 30.00    Types: Cigarettes  . Smokeless tobacco: Never Used  Substance and Sexual Activity  . Alcohol use: No    Alcohol/week: 0.0 standard drinks  . Drug use: No  . Sexual activity: Not on file

## 2020-08-26 ENCOUNTER — Telehealth: Payer: Self-pay

## 2020-08-26 NOTE — Telephone Encounter (Signed)
Can you call patient and make an appt foe eval have not seen her since 07/16/20.

## 2020-08-26 NOTE — Telephone Encounter (Signed)
Patient called she wants to know if she is able to use hydrocortisone 1% cream on her leg and she also wants to know if she can  soak in epsom salt. CB:[phone]

## 2020-08-27 ENCOUNTER — Telehealth: Payer: Self-pay | Admitting: Orthopedic Surgery

## 2020-08-27 NOTE — Telephone Encounter (Signed)
Received vm from patient. Stated she left message yesterday, has questions about caring for her legs. Please call [phone]

## 2020-08-28 NOTE — Telephone Encounter (Signed)
LMOM for patient to return our calls

## 2020-08-28 NOTE — Telephone Encounter (Signed)
Tried calling to discuss. Received greeting stating that call could not be completed at this time.

## 2021-02-28 ENCOUNTER — Inpatient Hospital Stay (HOSPITAL_COMMUNITY)
Admission: EM | Admit: 2021-02-28 | Discharge: 2021-03-06 | DRG: 190 | Disposition: A | Discharge status: Home Health | Payer: Medicaid Other | Attending: Family Medicine | Admitting: Family Medicine

## 2021-02-28 ENCOUNTER — Emergency Department (HOSPITAL_COMMUNITY): Payer: Medicaid Other

## 2021-02-28 ENCOUNTER — Encounter (HOSPITAL_COMMUNITY): Payer: Self-pay | Admitting: Emergency Medicine

## 2021-02-28 ENCOUNTER — Other Ambulatory Visit: Payer: Self-pay

## 2021-02-28 DIAGNOSIS — M79606 Pain in leg, unspecified: Secondary | ICD-10-CM | POA: Diagnosis not present

## 2021-02-28 DIAGNOSIS — L89323 Pressure ulcer of left buttock, stage 3: Secondary | ICD-10-CM | POA: Diagnosis present

## 2021-02-28 DIAGNOSIS — Z90710 Acquired absence of both cervix and uterus: Secondary | ICD-10-CM

## 2021-02-28 DIAGNOSIS — K219 Gastro-esophageal reflux disease without esophagitis: Secondary | ICD-10-CM | POA: Diagnosis present

## 2021-02-28 DIAGNOSIS — Z79890 Hormone replacement therapy (postmenopausal): Secondary | ICD-10-CM

## 2021-02-28 DIAGNOSIS — R0902 Hypoxemia: Secondary | ICD-10-CM

## 2021-02-28 DIAGNOSIS — L538 Other specified erythematous conditions: Secondary | ICD-10-CM | POA: Diagnosis not present

## 2021-02-28 DIAGNOSIS — E039 Hypothyroidism, unspecified: Secondary | ICD-10-CM | POA: Diagnosis present

## 2021-02-28 DIAGNOSIS — F32A Depression, unspecified: Secondary | ICD-10-CM | POA: Diagnosis present

## 2021-02-28 DIAGNOSIS — Z833 Family history of diabetes mellitus: Secondary | ICD-10-CM

## 2021-02-28 DIAGNOSIS — R0602 Shortness of breath: Secondary | ICD-10-CM

## 2021-02-28 DIAGNOSIS — J9601 Acute respiratory failure with hypoxia: Secondary | ICD-10-CM

## 2021-02-28 DIAGNOSIS — J189 Pneumonia, unspecified organism: Secondary | ICD-10-CM

## 2021-02-28 DIAGNOSIS — M109 Gout, unspecified: Secondary | ICD-10-CM | POA: Diagnosis present

## 2021-02-28 DIAGNOSIS — Z20822 Contact with and (suspected) exposure to covid-19: Secondary | ICD-10-CM | POA: Diagnosis present

## 2021-02-28 DIAGNOSIS — F17210 Nicotine dependence, cigarettes, uncomplicated: Secondary | ICD-10-CM | POA: Diagnosis present

## 2021-02-28 DIAGNOSIS — M7989 Other specified soft tissue disorders: Secondary | ICD-10-CM

## 2021-02-28 DIAGNOSIS — Z79899 Other long term (current) drug therapy: Secondary | ICD-10-CM

## 2021-02-28 DIAGNOSIS — J441 Chronic obstructive pulmonary disease with (acute) exacerbation: Secondary | ICD-10-CM | POA: Diagnosis not present

## 2021-02-28 DIAGNOSIS — I872 Venous insufficiency (chronic) (peripheral): Secondary | ICD-10-CM | POA: Diagnosis present

## 2021-02-28 DIAGNOSIS — G629 Polyneuropathy, unspecified: Secondary | ICD-10-CM | POA: Diagnosis present

## 2021-02-28 DIAGNOSIS — E7800 Pure hypercholesterolemia, unspecified: Secondary | ICD-10-CM | POA: Diagnosis present

## 2021-02-28 DIAGNOSIS — F419 Anxiety disorder, unspecified: Secondary | ICD-10-CM | POA: Diagnosis present

## 2021-02-28 DIAGNOSIS — Z8249 Family history of ischemic heart disease and other diseases of the circulatory system: Secondary | ICD-10-CM

## 2021-02-28 DIAGNOSIS — R2231 Localized swelling, mass and lump, right upper limb: Secondary | ICD-10-CM

## 2021-02-28 DIAGNOSIS — R0689 Other abnormalities of breathing: Secondary | ICD-10-CM

## 2021-02-28 DIAGNOSIS — I10 Essential (primary) hypertension: Secondary | ICD-10-CM | POA: Diagnosis present

## 2021-02-28 LAB — CBC
HCT: 37.8 % (ref 36.0–46.0)
Hemoglobin: 11.5 g/dL — ABNORMAL LOW (ref 12.0–15.0)
MCH: 26 pg (ref 26.0–34.0)
MCHC: 30.4 g/dL (ref 30.0–36.0)
MCV: 85.3 fL (ref 80.0–100.0)
Platelets: 361 10*3/uL (ref 150–400)
RBC: 4.43 MIL/uL (ref 3.87–5.11)
RDW: 19.6 % — ABNORMAL HIGH (ref 11.5–15.5)
WBC: 8.1 10*3/uL (ref 4.0–10.5)
nRBC: 0 % (ref 0.0–0.2)

## 2021-02-28 LAB — PROTIME-INR
INR: 1.1 (ref 0.8–1.2)
Prothrombin Time: 14.4 seconds (ref 11.4–15.2)

## 2021-02-28 LAB — CBC WITH DIFFERENTIAL/PLATELET
Abs Immature Granulocytes: 0.02 10*3/uL (ref 0.00–0.07)
Basophils Absolute: 0.1 10*3/uL (ref 0.0–0.1)
Basophils Relative: 1 %
Eosinophils Absolute: 0.1 10*3/uL (ref 0.0–0.5)
Eosinophils Relative: 2 %
HCT: 41.7 % (ref 36.0–46.0)
Hemoglobin: 12.3 g/dL (ref 12.0–15.0)
Immature Granulocytes: 0 %
Lymphocytes Relative: 21 %
Lymphs Abs: 1.9 10*3/uL (ref 0.7–4.0)
MCH: 25.7 pg — ABNORMAL LOW (ref 26.0–34.0)
MCHC: 29.5 g/dL — ABNORMAL LOW (ref 30.0–36.0)
MCV: 87.1 fL (ref 80.0–100.0)
Monocytes Absolute: 0.5 10*3/uL (ref 0.1–1.0)
Monocytes Relative: 6 %
Neutro Abs: 6.4 10*3/uL (ref 1.7–7.7)
Neutrophils Relative %: 70 %
Platelets: 391 10*3/uL (ref 150–400)
RBC: 4.79 MIL/uL (ref 3.87–5.11)
RDW: 19.9 % — ABNORMAL HIGH (ref 11.5–15.5)
WBC: 9 10*3/uL (ref 4.0–10.5)
nRBC: 0 % (ref 0.0–0.2)

## 2021-02-28 LAB — RESP PANEL BY RT-PCR (FLU A&B, COVID) ARPGX2
Influenza A by PCR: NEGATIVE
Influenza B by PCR: NEGATIVE
SARS Coronavirus 2 by RT PCR: NEGATIVE

## 2021-02-28 LAB — COMPREHENSIVE METABOLIC PANEL
ALT: 10 U/L (ref 0–44)
AST: 14 U/L — ABNORMAL LOW (ref 15–41)
Albumin: 3.1 g/dL — ABNORMAL LOW (ref 3.5–5.0)
Alkaline Phosphatase: 108 U/L (ref 38–126)
Anion gap: 10 (ref 5–15)
BUN: <5 mg/dL — ABNORMAL LOW (ref 8–23)
CO2: 27 mmol/L (ref 22–32)
Calcium: 8.3 mg/dL — ABNORMAL LOW (ref 8.9–10.3)
Chloride: 101 mmol/L (ref 98–111)
Creatinine, Ser: 0.9 mg/dL (ref 0.44–1.00)
GFR, Estimated: >60 mL/min (ref >60)
Glucose, Bld: 91 mg/dL (ref 70–99)
Potassium: 4.1 mmol/L (ref 3.5–5.1)
Sodium: 138 mmol/L (ref 135–145)
Total Bilirubin: 0.6 mg/dL (ref 0.3–1.2)
Total Protein: 7.6 g/dL (ref 6.5–8.1)

## 2021-02-28 LAB — TROPONIN I (HIGH SENSITIVITY)
Troponin I (High Sensitivity): 7 ng/L (ref <18)
Troponin I (High Sensitivity): 6 ng/L (ref <18)

## 2021-02-28 LAB — BRAIN NATRIURETIC PEPTIDE: B Natriuretic Peptide: 58.9 pg/mL (ref 0.0–100.0)

## 2021-02-28 LAB — CREATININE, SERUM
Creatinine, Ser: 0.79 mg/dL (ref 0.44–1.00)
GFR, Estimated: >60 mL/min (ref >60)

## 2021-02-28 LAB — LACTIC ACID, PLASMA
Lactic Acid, Venous: 1.2 mmol/L (ref 0.5–1.9)
Lactic Acid, Venous: 1 mmol/L (ref 0.5–1.9)

## 2021-02-28 MED ORDER — METHYLPREDNISOLONE SODIUM SUCC 125 MG IJ SOLR
125.0000 mg | Freq: Once | INTRAMUSCULAR | Status: AC
  Administered 2021-02-28: 125 mg via INTRAVENOUS
  Filled 2021-02-28: qty 2

## 2021-02-28 MED ORDER — MELATONIN 3 MG PO TABS
3.0000 mg | ORAL_TABLET | Freq: Every evening | ORAL | Status: DC | PRN
  Administered 2021-03-01 – 2021-03-05 (×6): 3 mg via ORAL
  Filled 2021-02-28 (×6): qty 1

## 2021-02-28 MED ORDER — FUROSEMIDE 20 MG PO TABS: 20.0000 mg | ORAL_TABLET | ORAL | Status: DC

## 2021-02-28 MED ORDER — ONDANSETRON HCL 4 MG/2ML IJ SOLN
4.0000 mg | Freq: Four times a day (QID) | INTRAMUSCULAR | Status: DC | PRN
  Filled 2021-02-28: qty 2

## 2021-02-28 MED ORDER — ALBUTEROL SULFATE (2.5 MG/3ML) 0.083% IN NEBU
5.0000 mg | INHALATION_SOLUTION | Freq: Once | RESPIRATORY_TRACT | Status: AC
  Administered 2021-02-28: 5 mg via RESPIRATORY_TRACT
  Filled 2021-02-28: qty 6

## 2021-02-28 MED ORDER — ATORVASTATIN CALCIUM 10 MG PO TABS
10.0000 mg | ORAL_TABLET | Freq: Every day | ORAL | Status: DC
  Administered 2021-03-01 – 2021-03-06 (×6): 10 mg via ORAL
  Filled 2021-02-28 (×6): qty 1

## 2021-02-28 MED ORDER — MOMETASONE FURO-FORMOTEROL FUM 200-5 MCG/ACT IN AERO
2.0000 | INHALATION_SPRAY | Freq: Two times a day (BID) | RESPIRATORY_TRACT | Status: DC
  Administered 2021-03-01 – 2021-03-06 (×11): 2 via RESPIRATORY_TRACT
  Filled 2021-02-28: qty 8.8

## 2021-02-28 MED ORDER — POLYETHYLENE GLYCOL 3350 17 G PO PACK: 17.0000 g | PACK | Freq: Every day | ORAL | Status: DC | PRN

## 2021-02-28 MED ORDER — IPRATROPIUM BROMIDE HFA 17 MCG/ACT IN AERS
2.0000 | INHALATION_SPRAY | Freq: Once | RESPIRATORY_TRACT | Status: AC
  Administered 2021-02-28: 2 via RESPIRATORY_TRACT
  Filled 2021-02-28: qty 12.9

## 2021-02-28 MED ORDER — IPRATROPIUM BROMIDE 0.02 % IN SOLN
0.5000 mg | Freq: Once | RESPIRATORY_TRACT | Status: AC
  Administered 2021-02-28: 0.5 mg via RESPIRATORY_TRACT
  Filled 2021-02-28: qty 2.5

## 2021-02-28 MED ORDER — LEVOTHYROXINE SODIUM 25 MCG PO TABS
125.0000 ug | ORAL_TABLET | Freq: Every day | ORAL | Status: DC
  Administered 2021-03-01 – 2021-03-06 (×6): 125 ug via ORAL
  Filled 2021-02-28 (×6): qty 1

## 2021-02-28 MED ORDER — ALBUTEROL SULFATE (2.5 MG/3ML) 0.083% IN NEBU: 2.5000 mg | INHALATION_SOLUTION | Freq: Once | RESPIRATORY_TRACT | Status: DC

## 2021-02-28 MED ORDER — ENOXAPARIN SODIUM 40 MG/0.4ML IJ SOSY
40.0000 mg | PREFILLED_SYRINGE | INTRAMUSCULAR | Status: DC
  Administered 2021-03-01 – 2021-03-05 (×5): 40 mg via SUBCUTANEOUS
  Filled 2021-02-28 (×4): qty 0.4

## 2021-02-28 MED ORDER — ACETAMINOPHEN 500 MG PO TABS
1000.0000 mg | ORAL_TABLET | Freq: Once | ORAL | Status: AC
  Administered 2021-02-28: 1000 mg via ORAL
  Filled 2021-02-28: qty 2

## 2021-02-28 MED ORDER — ACETAMINOPHEN 325 MG PO TABS
650.0000 mg | ORAL_TABLET | Freq: Four times a day (QID) | ORAL | Status: DC | PRN
  Administered 2021-03-01 – 2021-03-03 (×2): 650 mg via ORAL
  Filled 2021-02-28 (×2): qty 2

## 2021-02-28 MED ORDER — AZITHROMYCIN 250 MG PO TABS
250.0000 mg | ORAL_TABLET | Freq: Every day | ORAL | Status: AC
  Administered 2021-03-01 – 2021-03-04 (×4): 250 mg via ORAL
  Filled 2021-02-28 (×4): qty 1

## 2021-02-28 MED ORDER — MAGNESIUM SULFATE 2 GM/50ML IV SOLN
2.0000 g | Freq: Once | INTRAVENOUS | Status: AC
  Administered 2021-02-28: 2 g via INTRAVENOUS
  Filled 2021-02-28: qty 50

## 2021-02-28 MED ORDER — METHYLPREDNISOLONE SODIUM SUCC 125 MG IJ SOLR: 60.0000 mg | Freq: Every day | INTRAMUSCULAR | Status: DC

## 2021-02-28 MED ORDER — AZITHROMYCIN 250 MG PO TABS
500.0000 mg | ORAL_TABLET | Freq: Every day | ORAL | Status: AC
  Administered 2021-03-01: 500 mg via ORAL
  Filled 2021-02-28: qty 2

## 2021-02-28 MED ORDER — IOHEXOL 350 MG/ML SOLN
50.0000 mL | Freq: Once | INTRAVENOUS | Status: AC | PRN
  Administered 2021-02-28: 50 mL via INTRAVENOUS

## 2021-02-28 MED ORDER — OXYCODONE HCL 5 MG PO TABS
5.0000 mg | ORAL_TABLET | Freq: Once | ORAL | Status: AC
  Administered 2021-02-28: 5 mg via ORAL
  Filled 2021-02-28: qty 1

## 2021-02-28 MED ORDER — ALBUTEROL SULFATE HFA 108 (90 BASE) MCG/ACT IN AERS
4.0000 | INHALATION_SPRAY | Freq: Once | RESPIRATORY_TRACT | Status: AC
  Administered 2021-02-28: 4 via RESPIRATORY_TRACT
  Filled 2021-02-28: qty 6.7

## 2021-02-28 NOTE — H&P (Signed)
History and Physical  [name] TLX:[id number] DOB: 1956-06-18 DOA: 02/28/2021  Referring physician: Dr. Lisbeth Renshaw, EDP. PCP: Rodrigo Ran, MD  Outpatient Specialists: GI, orthopedic surgery. Patient coming from: Home.  Chief Complaint: Shortness of breath.  HPI: [name] is a 65 y.o. female with medical history significant for COPD, chronic bilateral lower extremity venous stasis, gout, hypercholesteremia, who presented to [facility] ED with complaints of shortness of breath and productive cough progressively worsening in the past week.  Associated with wheezing.  She quit smoking about a month ago.  Has been smoking since the age of 69.  She also reports bowel incontinence for the past week, no recent use of antibiotics, no nausea or vomiting or abdominal pain.  No fevers.  Upon presentation to the ED O2 saturation 88% on room air.  She received IV Solu-Medrol 125 mg x 1, albuterol and Atrovent inhalers and then DuoNebs with mild improvement of symptomatology.  Patient not on oxygen supplementation at baseline.  Due to concern for possible pulmonary embolism a CTA chest was ordered by EDP.  It returned negative for PE but with findings suggestive of central bronchial thickening, small area of groundglass tree in bud opacity in the perihilar right upper lobe likely pneumonitis.  Coronary artery calcifications.  Patient admitted under hospitalist service.  ED Course:  Temperature 98.7.  BP 112/77, pulse 78, respiration rate 28.  O2 saturation 96% on 2 L.  Lab studies remarkable for BNP 58.  Troponin 6, repeated 7.  Lactic acid 1.0.  CMP essentially unremarkable.  CBC is essentially unremarkable.  Review of Systems: Review of systems as noted in the HPI. All other systems reviewed and are negative.   Past Medical History:  Diagnosis Date   COPD (chronic obstructive pulmonary disease) (HCC)    Gout    Hypercholesteremia    Past Surgical History:  Procedure Laterality Date   ABDOMINAL  HYSTERECTOMY     ROTATOR CUFF REPAIR      Social History:  reports that she has been smoking cigarettes. She has a 30.00 pack-year smoking history. She has never used smokeless tobacco. She reports that she does not drink alcohol and does not use drugs.   No Known Allergies  Family history: Father died of the motor vehicle accident and he did have diabetes. Mother died of brain tumors in her 56s. There is some family history of coronary disease and congestive heart failure.  Prior to Admission medications   Medication Sig Start Date End Date Taking? Authorizing Provider  ALPRAZolam Prudy Feeler) 1 MG tablet Take 1 mg by mouth 3 (three) times daily. 03/22/18   [provider]  atorvastatin (LIPITOR) 10 MG tablet Take 10 mg by mouth daily.  10/10/17   [provider]  buPROPion (WELLBUTRIN XL) 150 MG 24 hr tablet Take 150 mg by mouth every evening.     [provider]  cyclobenzaprine (FLEXERIL) 5 MG tablet Take 5 mg by mouth 3 (three) times daily.  12/31/17   [provider]  DEXILANT 60 MG capsule Take 60 mg by mouth daily. 12/27/17   [provider]  furosemide (LASIX) 20 MG tablet Take 20 mg by mouth every other day. 02/07/18   [provider]  gabapentin (NEURONTIN) 100 MG capsule Take 100 mg by mouth 3 (three) times daily.  12/26/17   [provider]  HYDROcodone-acetaminophen (NORCO) 10-325 MG tablet Take 1 tablet by mouth every 6 (six) hours as needed for moderate pain. for pain 01/02/18  [provider]  levothyroxine (SYNTHROID, LEVOTHROID) 125 MCG tablet Take 125 mcg by mouth daily. 03/03/18   [provider]  nitroGLYCERIN (NITROSTAT) 0.4 MG SL tablet Place 0.4 mg under the tongue every 5 (five) minutes as needed for chest pain.    [provider]  SYMBICORT 160-4.5 MCG/ACT inhaler Inhale 2 puffs into the lungs 2 (two) times daily.  11/14/17   [provider]  venlafaxine XR (EFFEXOR-XR) 150 MG 24 hr  capsule Take 150 mg by mouth daily. 02/24/18   [provider]    Physical Exam: BP (!) 111/52   Pulse 96   Temp 98.7 F (37.1 C) (Oral)   Resp (!) 24   SpO2 99%   General: 65 y.o. year-old female well developed well nourished in no acute distress.  Alert and oriented x3. Cardiovascular: Regular rate and rhythm with no rubs or gallops.  No thyromegaly or JVD noted.  2+ pitting edema in lower extremity bilaterally. Respiratory: Diffuse wheezing bilaterally.  Rales noted at bases.  Good inspiratory effort.   Abdomen: Soft nontender nondistended with normal bowel sounds x4 quadrants. Muskuloskeletal: No cyanosis or clubbing 2+ pitting edema in lower extremities bilaterally. Neuro: CN II-XII intact, strength, sensation, reflexes Skin: Dry skin affecting lower extremities bilaterally.  Chronic venous stasis. Psychiatry: Judgement and insight appear normal. Mood is appropriate for condition and setting          Labs on Admission:  Basic Metabolic Panel: Recent Labs  Lab 02/28/21 1325  NA 138  K 4.1  CL 101  CO2 27  GLUCOSE 91  BUN <5*  CREATININE 0.90  CALCIUM 8.3*   Liver Function Tests: Recent Labs  Lab 02/28/21 1325  AST 14*  ALT 10  ALKPHOS 108  BILITOT 0.6  PROT 7.6  ALBUMIN 3.1*   No results for input(s): LIPASE, AMYLASE in the last 168 hours. No results for input(s): AMMONIA in the last 168 hours. CBC: Recent Labs  Lab 02/28/21 1325  WBC 9.0  NEUTROABS 6.4  HGB 12.3  HCT 41.7  MCV 87.1  PLT 391   Cardiac Enzymes: No results for input(s): CKTOTAL, CKMB, CKMBINDEX, TROPONINI in the last 168 hours.  BNP (last 3 results) Recent Labs    02/28/21 1325  BNP 58.9    ProBNP (last 3 results) No results for input(s): PROBNP in the last 8760 hours.  CBG: No results for input(s): GLUCAP in the last 168 hours.  Radiological Exams on Admission: DG Chest 2 View  Result Date: 02/28/2021 CLINICAL DATA:  Cough and shortness of breath. EXAM: CHEST -  2 VIEW COMPARISON:  02/16/2009 FINDINGS: Midline trachea. Normal heart size and mediastinal contours. No pleural effusion or pneumothorax. Clear lungs. Moderate thoracic spondylosis. Degenerative changes of both shoulders. IMPRESSION: No acute cardiopulmonary disease. Electronically Signed   By: Jeronimo Greaves M.D.   On: 02/28/2021 14:11   CT Angio Chest PE W and/or Wo Contrast  Result Date: 02/28/2021 CLINICAL DATA:  PE suspected, high prob Shortness of breath. EXAM: CT ANGIOGRAPHY CHEST WITH CONTRAST TECHNIQUE: Multidetector CT imaging of the chest was performed using the standard protocol during bolus administration of intravenous contrast. Multiplanar CT image reconstructions and MIPs were obtained to evaluate the vascular anatomy. CONTRAST:  50mL OMNIPAQUE IOHEXOL 350 MG/ML SOLN COMPARISON:  Radiograph earlier today. FINDINGS: Cardiovascular: There are no filling defects within the pulmonary arteries to suggest pulmonary embolus. Thoracic aorta is normal in caliber. Mild aortic atherosclerosis. No aortic dissection. The heart is normal in size.  There are coronary artery calcifications. No pericardial effusion. Mediastinum/Nodes: There are calcified mediastinal lymph nodes consistent with prior granulomatous disease. No noncalcified adenopathy. Esophagus mildly patulous without wall thickening. No thyroid nodule. Lungs/Pleura: Central bronchial thickening. Minimal retained mucus in the right mainstem bronchus. Small area of ground-glass and tree in bud opacity in the perihilar right upper lobe, series 7, image 62, likely pneumonitis. Lingular atelectasis. No confluent consolidation. No pleural fluid. No findings of pulmonary edema. No pulmonary mass or suspicious nodule. Upper Abdomen: No acute findings.  Cholecystectomy. Musculoskeletal: Diffuse thoracic spondylosis. Bilateral glenohumeral osteoarthritis, right greater than left. There are no acute or suspicious osseous abnormalities. Review of the MIP images  confirms the above findings. IMPRESSION: 1. No pulmonary embolus. 2. Central bronchial thickening. Small area of ground-glass and tree in bud opacity in the perihilar right upper lobe, likely pneumonitis. 3. Coronary artery calcifications. Aortic Atherosclerosis (ICD10-I70.0). Electronically Signed   By: Narda Rutherford M.D.   On: 02/28/2021 21:52   VAS [place] LOWER EXTREMITY VENOUS (DVT) (ONLY [facility] & [facility] 7a-7p)  Result Date: 02/28/2021  Lower Venous DVT Study Patient Name:  RODNISHA BLOMGREN  Date of Exam:   02/28/2021 Medical Rec #: [id number]      Accession #:    [id number] Date of Birth: [birth year]      Patient Gender: F Patient Age:   73Y Exam Location:  [facility] Procedure:      VAS [place] LOWER EXTREMITY VENOUS (DVT) Referring Phys: [id number] SOPHIA CACCAVALE --------------------------------------------------------------------------------  Indications: Pain, Swelling, and Erythema.  Limitations: Edema, skin texture. Comparison Study: Prior negative right LEV done 11/10/13 Performing Technologist: Sherren Kerns RVS  Examination Guidelines: A complete evaluation includes B-mode imaging, spectral Doppler, color Doppler, and power Doppler as needed of all accessible portions of each vessel. Bilateral testing is considered an integral part of a complete examination. Limited examinations for reoccurring indications may be performed as noted. The reflux portion of the exam is performed with the patient in reverse Trendelenburg.  +---------+---------------+---------+-----------+----------+-------------------+ RIGHT    CompressibilityPhasicitySpontaneityPropertiesThrombus Aging      +---------+---------------+---------+-----------+----------+-------------------+ CFV      Full           Yes      Yes                                      +---------+---------------+---------+-----------+----------+-------------------+ SFJ      Full                                                              +---------+---------------+---------+-----------+----------+-------------------+ FV Prox  Full                                                             +---------+---------------+---------+-----------+----------+-------------------+ FV Mid   Full                                                             +---------+---------------+---------+-----------+----------+-------------------+  FV DistalFull                                                             +---------+---------------+---------+-----------+----------+-------------------+ PFV      Full                                                             +---------+---------------+---------+-----------+----------+-------------------+ POP      Full           Yes      Yes                                      +---------+---------------+---------+-----------+----------+-------------------+ PTV                                                   Not well visualized +---------+---------------+---------+-----------+----------+-------------------+ PERO                                                  Not well visualized +---------+---------------+---------+-----------+----------+-------------------+   +---------+---------------+---------+-----------+----------+--------------+ LEFT     CompressibilityPhasicitySpontaneityPropertiesThrombus Aging +---------+---------------+---------+-----------+----------+--------------+ CFV      Full                                                        +---------+---------------+---------+-----------+----------+--------------+ SFJ      Full                                                        +---------+---------------+---------+-----------+----------+--------------+ FV Prox  Full                                                        +---------+---------------+---------+-----------+----------+--------------+ FV Mid   Full                                                         +---------+---------------+---------+-----------+----------+--------------+ FV DistalFull                                                        +---------+---------------+---------+-----------+----------+--------------+  PFV      Full                                                        +---------+---------------+---------+-----------+----------+--------------+ POP      Full           Yes      Yes                                 +---------+---------------+---------+-----------+----------+--------------+ PTV      Full                                                        +---------+---------------+---------+-----------+----------+--------------+ PERO     Full                                                        +---------+---------------+---------+-----------+----------+--------------+    Summary: RIGHT: - There is no evidence of deep vein thrombosis in the lower extremity. However, portions of this examination were limited- see technologist comments above.  LEFT: - There is no evidence of deep vein thrombosis in the lower extremity.  *See table(s) above for measurements and observations.    Preliminary     EKG: I independently viewed the EKG done and my findings are as followed: Sinus tachycardia, rate of 104, with frequent PVCs.  QTc 452.  Assessment/Plan Present on Admission:  Acute exacerbation of chronic obstructive pulmonary disease (COPD) (HCC)  Active Problems:   Acute exacerbation of chronic obstructive pulmonary disease (COPD) (HCC)  Acute COPD exacerbation Presented with progressively worsening shortness of breath and productive cough for the past week. Wheezing on exam. Started Solu-Medrol 40 mg twice daily. Continue DuoNeb every 6 hours Resume home bronchodilators. Incentive spirometer Z-Pak x5 days.  Acute hypoxic respiratory failure secondary to acute COPD exacerbation Treat underlying condition as stated  above. Not on oxygen supplementation at baseline Currently on 2 L to maintain O2 saturation greater than 90%. Wean off oxygen supplementation as tolerated Home O2 evaluation on 03/01/2021 for DC planning.  Chronic bilateral lower extremity venous insufficiency She follows with Dr. Lajoyce Cornersuda [facility].  Hypothyroidism Resume home levothyroxine  Hyperlipidemia Resume home Lipitor  GERD Resume home Dexilant  Polyneuropathy Resume home gabapentin  Anxiety/depression Resume home regimen.  DVT prophylaxis: Subcutaneous Lovenox daily  Code Status: Full code  Family Communication: None at bedside  Disposition Plan: Admit to MedSurg unit.  Consults called: None  Admission status: Observation status.   Status is: Observation    Dispo:  Patient From: Home  Planned Disposition: Home, possibly on 02/19/2021 once symptomatology has improved.  Medically stable for discharge: No      Darlin Droparole N Yamili Lichtenwalner MD [facility] Pager [phone]  If 7PM-7AM, please contact night-coverage [email or web address] Password [email or web address]  02/28/2021, 10:46 PM

## 2021-02-28 NOTE — ED Provider Notes (Signed)
Emergency Medicine Provider Triage Evaluation Note  [name] , a 65 y.o. female  was evaluated in triage.  BIB ambulance after calling for SOB.  Patient has a history of COPD not requiring oxygen.  Says that she has been feeling increasingly short of breath over the past week endorses cough that is bringing up green sputum.  Has felt feverish at home.   Review of Systems  Positive: SOB, productive cough, fever Negative: CP, palpitations  Physical Exam  BP 116/68 (BP Location: Left Arm)   Pulse (!) 101   Temp 98.7 F (37.1 C) (Oral)   Resp 16   SpO2 (!) 88%  Gen:   Awake, mild distress  Resp:  Increased work of breathing, cough and wheezes bilat MSK:   Moves extremities without difficulty.   Medical Decision Making  Medically screening exam initiated at 1:11 PM.  Appropriate orders placed.  [name] was informed that the remainder of the evaluation will be completed by another provider, this initial triage assessment does not replace that evaluation, and the importance of remaining in the ED until their evaluation is complete.    Woodroe Chen 02/28/21 1318    Terald Sleeper, MD 02/28/21 1435

## 2021-02-28 NOTE — ED Triage Notes (Signed)
Pt to triage via [facility] from home.  Reports increased SOB x 1 week and nausea/diarrhea x 3 days.  Reports feeling feverish.  O2 sats 88% for EMS.  Placed on 2 liters [place].  18g R wrist.

## 2021-02-28 NOTE — ED Provider Notes (Signed)
[facility] EMERGENCY DEPARTMENT Provider Note   CSN: [id number] Arrival date & time: 02/28/21  1303     History Chief Complaint  Patient presents with   Shortness of Breath    [name] is a 65 y.o. female presenting for evaluation of shortness of breath and cough.  Patient states that the past week she has had increased shortness of breath and a worsening cough productive of green sputum.  Reports subjective fevers.  Associated diarrhea.  She has been trying to fight at home without improvement of symptoms.  She denies chest pain, nausea, vomiting, abdominal pain.  She does report leg swelling and pain, is always worse on the right.  She has a history of COPD, has been using inhalers without improvement.  She does not wear oxygen at baseline.  She denies a history of CHF, however per chart review, she is on Lasix.  No sick contacts.  She is vaccinated for COVID.  Additional history obtained per chart review.  Patient with a history of COPD, hypercholesteremia, depression, venous insufficiency, hypertension, sleep apnea  HPI     Past Medical History:  Diagnosis Date   COPD (chronic obstructive pulmonary disease) (HCC)    Gout    Hypercholesteremia     Patient Active Problem List   Diagnosis Date Noted   Acute exacerbation of chronic obstructive pulmonary disease (COPD) (HCC) 02/28/2021   Pyelonephritis 04/04/2018   Depression 04/04/2018   Venous insufficiency (chronic) (peripheral) 01/11/2018   BRONCHITIS, ACUTE 10/21/2008   Essential hypertension 07/13/2007   Asthma 07/11/2007   Esophageal reflux 07/11/2007   SLEEP APNEA 07/11/2007   SHORTNESS OF BREATH 07/11/2007   COUGH 07/11/2007    Past Surgical History:  Procedure Laterality Date   ABDOMINAL HYSTERECTOMY     ROTATOR CUFF REPAIR       OB History   No obstetric history on file.     No family history on file.  Social History   Tobacco Use   Smoking status: Every Day     Packs/day: 1.00    Years: 30.00    Pack years: 30.00    Types: Cigarettes   Smokeless tobacco: Never  Substance Use Topics   Alcohol use: No    Alcohol/week: 0.0 standard drinks   Drug use: No    Home Medications Prior to Admission medications   Medication Sig Start Date End Date Taking? Authorizing Provider  ALPRAZolam Prudy Feeler) 1 MG tablet Take 1 mg by mouth 3 (three) times daily. 03/22/18   [provider]  atorvastatin (LIPITOR) 10 MG tablet Take 10 mg by mouth daily.  10/10/17   [provider]  buPROPion (WELLBUTRIN XL) 150 MG 24 hr tablet Take 150 mg by mouth every evening.     [provider]  cyclobenzaprine (FLEXERIL) 5 MG tablet Take 5 mg by mouth 3 (three) times daily.  12/31/17   [provider]  DEXILANT 60 MG capsule Take 60 mg by mouth daily. 12/27/17   [provider]  furosemide (LASIX) 20 MG tablet Take 20 mg by mouth every other day. 02/07/18   [provider]  gabapentin (NEURONTIN) 100 MG capsule Take 100 mg by mouth 3 (three) times daily.  12/26/17   [provider]  HYDROcodone-acetaminophen (NORCO) 10-325 MG tablet Take 1 tablet by mouth every 6 (six) hours as needed for moderate pain. for pain 01/02/18   [provider]  levothyroxine (SYNTHROID, LEVOTHROID) 125 MCG tablet Take 125 mcg by mouth daily.  03/03/18   [provider]  nitroGLYCERIN (NITROSTAT) 0.4 MG SL tablet Place 0.4 mg under the tongue every 5 (five) minutes as needed for chest pain.    [provider]  SYMBICORT 160-4.5 MCG/ACT inhaler Inhale 2 puffs into the lungs 2 (two) times daily.  11/14/17   [provider]  venlafaxine XR (EFFEXOR-XR) 150 MG 24 hr capsule Take 150 mg by mouth daily. 02/24/18   [provider]    Allergies    Patient has no known allergies.  Review of Systems   Review of Systems  Respiratory:  Positive for cough and shortness of breath.   Cardiovascular:  Positive for leg  swelling.  Gastrointestinal:  Positive for diarrhea.  All other systems reviewed and are negative.  Physical Exam Updated Vital Signs BP (!) 111/52   Pulse 96   Temp 98.7 F (37.1 C) (Oral)   Resp (!) 24   SpO2 99%   Physical Exam Vitals and nursing note reviewed.  Constitutional:      General: She is not in acute distress.    Appearance: Normal appearance.     Comments: Appears chronically ill, not in acute distress  HENT:     Head: Normocephalic and atraumatic.  Eyes:     Conjunctiva/sclera: Conjunctivae normal.     Pupils: Pupils are equal, round, and reactive to light.  Cardiovascular:     Rate and Rhythm: Normal rate and regular rhythm.     Pulses: Normal pulses.  Pulmonary:     Effort: Pulmonary effort is normal. No respiratory distress.     Breath sounds: Normal breath sounds. No wheezing.     Comments: Speaking in full sentences.  Expiratory wheezing noted in all fields.  Sats in the mid 90s on 2 L via nasal cannula.  On room air when talking, sats dropped to about 90% on my exam. On arrival, SpO2 88% on RA Abdominal:     General: There is no distension.     Palpations: Abdomen is soft. There is no mass.     Tenderness: There is no abdominal tenderness. There is no guarding or rebound.  Musculoskeletal:        General: Normal range of motion.     Cervical back: Normal range of motion and neck supple.     Right lower leg: Edema present.     Left lower leg: Edema present.  Skin:    General: Skin is warm and dry.     Capillary Refill: Capillary refill takes less than 2 seconds.  Neurological:     Mental Status: She is alert and oriented to person, place, and time.  Psychiatric:        Mood and Affect: Mood and affect normal.        Speech: Speech normal.        Behavior: Behavior normal.    ED Results / Procedures / Treatments   Labs (all labs ordered are listed, but only abnormal results are displayed) Labs Reviewed  CBC WITH DIFFERENTIAL/PLATELET -  Abnormal; Notable for the following components:      Result Value   MCH 25.7 (*)    MCHC 29.5 (*)    RDW 19.9 (*)    All other components within normal limits  COMPREHENSIVE METABOLIC PANEL - Abnormal; Notable for the following components:   BUN <5 (*)    Calcium 8.3 (*)    Albumin 3.1 (*)    AST 14 (*)    All other components within  normal limits  RESP PANEL BY RT-PCR (FLU A&B, COVID) ARPGX2  CULTURE, BLOOD (ROUTINE X 2)  CULTURE, BLOOD (ROUTINE X 2)  PROTIME-INR  BRAIN NATRIURETIC PEPTIDE  LACTIC ACID, PLASMA  LACTIC ACID, PLASMA  TROPONIN I (HIGH SENSITIVITY)  TROPONIN I (HIGH SENSITIVITY)    EKG EKG Interpretation  Date/Time:  Saturday February 28 2021 13:06:52 EDT Ventricular Rate:  104 PR Interval:  132 QRS Duration: 76 QT Interval:  344 QTC Calculation: 452 R Axis:   99 Text Interpretation: Sinus tachycardia with frequent Premature ventricular complexes Rightward axis Nonspecific ST abnormality Abnormal ECG Confirmed by Vanetta Mulders [phone]) on 02/28/2021 4:45:59 PM  Radiology DG Chest 2 View  Result Date: 02/28/2021 CLINICAL DATA:  Cough and shortness of breath. EXAM: CHEST - 2 VIEW COMPARISON:  02/16/2009 FINDINGS: Midline trachea. Normal heart size and mediastinal contours. No pleural effusion or pneumothorax. Clear lungs. Moderate thoracic spondylosis. Degenerative changes of both shoulders. IMPRESSION: No acute cardiopulmonary disease. Electronically Signed   By: Jeronimo Greaves M.D.   On: 02/28/2021 14:11   CT Angio Chest PE W and/or Wo Contrast  Result Date: 02/28/2021 CLINICAL DATA:  PE suspected, high prob Shortness of breath. EXAM: CT ANGIOGRAPHY CHEST WITH CONTRAST TECHNIQUE: Multidetector CT imaging of the chest was performed using the standard protocol during bolus administration of intravenous contrast. Multiplanar CT image reconstructions and MIPs were obtained to evaluate the vascular anatomy. CONTRAST:  84mL OMNIPAQUE IOHEXOL 350 MG/ML SOLN COMPARISON:   Radiograph earlier today. FINDINGS: Cardiovascular: There are no filling defects within the pulmonary arteries to suggest pulmonary embolus. Thoracic aorta is normal in caliber. Mild aortic atherosclerosis. No aortic dissection. The heart is normal in size. There are coronary artery calcifications. No pericardial effusion. Mediastinum/Nodes: There are calcified mediastinal lymph nodes consistent with prior granulomatous disease. No noncalcified adenopathy. Esophagus mildly patulous without wall thickening. No thyroid nodule. Lungs/Pleura: Central bronchial thickening. Minimal retained mucus in the right mainstem bronchus. Small area of ground-glass and tree in bud opacity in the perihilar right upper lobe, series 7, image 62, likely pneumonitis. Lingular atelectasis. No confluent consolidation. No pleural fluid. No findings of pulmonary edema. No pulmonary mass or suspicious nodule. Upper Abdomen: No acute findings.  Cholecystectomy. Musculoskeletal: Diffuse thoracic spondylosis. Bilateral glenohumeral osteoarthritis, right greater than left. There are no acute or suspicious osseous abnormalities. Review of the MIP images confirms the above findings. IMPRESSION: 1. No pulmonary embolus. 2. Central bronchial thickening. Small area of ground-glass and tree in bud opacity in the perihilar right upper lobe, likely pneumonitis. 3. Coronary artery calcifications. Aortic Atherosclerosis (ICD10-I70.0). Electronically Signed   By: Narda Rutherford M.D.   On: 02/28/2021 21:52   VAS [place] LOWER EXTREMITY VENOUS (DVT) (ONLY [facility] & [facility] 7a-7p)  Result Date: 02/28/2021  Lower Venous DVT Study Patient Name:  TEMILOLUWA RECCHIA  Date of Exam:   02/28/2021 Medical Rec #: [id number]      Accession #:    [id number] Date of Birth: [birth year]      Patient Gender: F Patient Age:   89Y Exam Location:  [facility] Procedure:      VAS [place] LOWER EXTREMITY VENOUS (DVT) Referring Phys: [id number] Maliik Karner  --------------------------------------------------------------------------------  Indications: Pain, Swelling, and Erythema.  Limitations: Edema, skin texture. Comparison Study: Prior negative right LEV done 11/10/13 Performing Technologist: Sherren Kerns RVS  Examination Guidelines: A complete evaluation includes B-mode imaging, spectral Doppler, color Doppler, and power Doppler as needed of all accessible portions of each vessel. Bilateral testing is considered an  integral part of a complete examination. Limited examinations for reoccurring indications may be performed as noted. The reflux portion of the exam is performed with the patient in reverse Trendelenburg.  +---------+---------------+---------+-----------+----------+-------------------+ RIGHT    CompressibilityPhasicitySpontaneityPropertiesThrombus Aging      +---------+---------------+---------+-----------+----------+-------------------+ CFV      Full           Yes      Yes                                      +---------+---------------+---------+-----------+----------+-------------------+ SFJ      Full                                                             +---------+---------------+---------+-----------+----------+-------------------+ FV Prox  Full                                                             +---------+---------------+---------+-----------+----------+-------------------+ FV Mid   Full                                                             +---------+---------------+---------+-----------+----------+-------------------+ FV DistalFull                                                             +---------+---------------+---------+-----------+----------+-------------------+ PFV      Full                                                             +---------+---------------+---------+-----------+----------+-------------------+ POP      Full           Yes      Yes                                       +---------+---------------+---------+-----------+----------+-------------------+ PTV                                                   Not well visualized +---------+---------------+---------+-----------+----------+-------------------+ PERO  Not well visualized +---------+---------------+---------+-----------+----------+-------------------+   +---------+---------------+---------+-----------+----------+--------------+ LEFT     CompressibilityPhasicitySpontaneityPropertiesThrombus Aging +---------+---------------+---------+-----------+----------+--------------+ CFV      Full                                                        +---------+---------------+---------+-----------+----------+--------------+ SFJ      Full                                                        +---------+---------------+---------+-----------+----------+--------------+ FV Prox  Full                                                        +---------+---------------+---------+-----------+----------+--------------+ FV Mid   Full                                                        +---------+---------------+---------+-----------+----------+--------------+ FV DistalFull                                                        +---------+---------------+---------+-----------+----------+--------------+ PFV      Full                                                        +---------+---------------+---------+-----------+----------+--------------+ POP      Full           Yes      Yes                                 +---------+---------------+---------+-----------+----------+--------------+ PTV      Full                                                        +---------+---------------+---------+-----------+----------+--------------+ PERO     Full                                                         +---------+---------------+---------+-----------+----------+--------------+    Summary: RIGHT: - There is no evidence of deep vein thrombosis in the lower extremity. However, portions of this examination were limited- see technologist comments above.  LEFT: - There is  no evidence of deep vein thrombosis in the lower extremity.  *See table(s) above for measurements and observations.    Preliminary     Procedures Procedures   Medications Ordered in ED Medications  magnesium sulfate IVPB 2 g 50 mL (2 g Intravenous New Bag/Given 02/28/21 2145)  albuterol (VENTOLIN HFA) 108 (90 Base) MCG/ACT inhaler 4 puff (4 puffs Inhalation Given 02/28/21 1613)  ipratropium (ATROVENT HFA) inhaler 2 puff (2 puffs Inhalation Given 02/28/21 1613)  acetaminophen (TYLENOL) tablet 1,000 mg (1,000 mg Oral Given 02/28/21 1917)  albuterol (PROVENTIL) (2.5 MG/3ML) 0.083% nebulizer solution 5 mg (5 mg Nebulization Given 02/28/21 2134)  ipratropium (ATROVENT) nebulizer solution 0.5 mg (0.5 mg Nebulization Given 02/28/21 2134)  iohexol (OMNIPAQUE) 350 MG/ML injection 50 mL (50 mLs Intravenous Contrast Given 02/28/21 2118)  oxyCODONE (Oxy IR/ROXICODONE) immediate release tablet 5 mg (5 mg Oral Given 02/28/21 2146)  methylPREDNISolone sodium succinate (SOLU-MEDROL) 125 mg/2 mL injection 125 mg (125 mg Intravenous Given 02/28/21 2140)    ED Course  I have reviewed the triage vital signs and the nursing notes.  Pertinent labs & imaging results that were available during my care of the patient were reviewed by me and considered in my medical decision making (see chart for details).    MDM Rules/Calculators/A&P                           Patient presented for evaluation of shortness of breath, cough, diarrhea.  On exam, patient appears chronically ill.  She is expiratory wheezing in all fields.  Arrived to the ER hypoxic with a room air sat of 88%, not normally on oxygen.  On my exam, she has wheezing.  Consider COPD exacerbation.   However in the setting of subjective fevers and cough, also consider pneumonia or viral illness.  Consider PE in the setting of leg swelling, specially when right is worse than left.  Consider heart failure.  Will obtain labs, chest x-ray, PE study, and venous ultrasound of the legs.  Ultrasound of the leg negative for DVT.  Labs interpreted by me, overall reassuring.  No leukocytosis.  Electrolytes are stable.  Troponin and BNP negative.  COVID and flu are negative.  Chest x-ray viewed and independently interpreted by me, no pneumonia without some effusion.  In the setting of hypoxia without an obvious source other than possible COPD, will obtain PE study.  CTA negative for PE, however is consistent with pneumonitis.  Patient continues to require oxygen, despite COPD treatment.  As such, will call for admission.  Discussed with Dr. Margo Aye from [facility] hospitalist service, patient to be admitted.   Final Clinical Impression(s) / ED Diagnoses Final diagnoses:  Shortness of breath  Hypoxia  COPD exacerbation (HCC)  Pneumonitis    Rx / DC Orders ED Discharge Orders     None        Alveria Apley, PA-C 02/28/21 2240    Vanetta Mulders, MD 03/05/21 1245

## 2021-02-28 NOTE — ED Notes (Signed)
Patient stated tylenol was not effective. Patient endorses taking stronger pain meds at home.

## 2021-02-28 NOTE — Progress Notes (Signed)
VASCULAR LAB    Bilateral lower extremity venous duplex has been performed.  See CV proc for preliminary results.  Gave verbal report to Alveria Apley, PA-C  Brian Kocourek, RVT 02/28/2021, 5:03 PM

## 2021-03-01 DIAGNOSIS — I872 Venous insufficiency (chronic) (peripheral): Secondary | ICD-10-CM | POA: Diagnosis present

## 2021-03-01 DIAGNOSIS — J9601 Acute respiratory failure with hypoxia: Secondary | ICD-10-CM

## 2021-03-01 DIAGNOSIS — F32A Depression, unspecified: Secondary | ICD-10-CM | POA: Diagnosis present

## 2021-03-01 DIAGNOSIS — E7800 Pure hypercholesterolemia, unspecified: Secondary | ICD-10-CM | POA: Diagnosis present

## 2021-03-01 DIAGNOSIS — R2231 Localized swelling, mass and lump, right upper limb: Secondary | ICD-10-CM | POA: Diagnosis not present

## 2021-03-01 DIAGNOSIS — Z833 Family history of diabetes mellitus: Secondary | ICD-10-CM | POA: Diagnosis not present

## 2021-03-01 DIAGNOSIS — Z20822 Contact with and (suspected) exposure to covid-19: Secondary | ICD-10-CM | POA: Diagnosis present

## 2021-03-01 DIAGNOSIS — G629 Polyneuropathy, unspecified: Secondary | ICD-10-CM | POA: Diagnosis present

## 2021-03-01 DIAGNOSIS — F17210 Nicotine dependence, cigarettes, uncomplicated: Secondary | ICD-10-CM | POA: Diagnosis present

## 2021-03-01 DIAGNOSIS — M109 Gout, unspecified: Secondary | ICD-10-CM | POA: Diagnosis present

## 2021-03-01 DIAGNOSIS — Z90710 Acquired absence of both cervix and uterus: Secondary | ICD-10-CM | POA: Diagnosis not present

## 2021-03-01 DIAGNOSIS — Z79890 Hormone replacement therapy (postmenopausal): Secondary | ICD-10-CM | POA: Diagnosis not present

## 2021-03-01 DIAGNOSIS — R0602 Shortness of breath: Secondary | ICD-10-CM | POA: Diagnosis present

## 2021-03-01 DIAGNOSIS — E039 Hypothyroidism, unspecified: Secondary | ICD-10-CM | POA: Diagnosis present

## 2021-03-01 DIAGNOSIS — J441 Chronic obstructive pulmonary disease with (acute) exacerbation: Secondary | ICD-10-CM | POA: Diagnosis present

## 2021-03-01 DIAGNOSIS — Z8249 Family history of ischemic heart disease and other diseases of the circulatory system: Secondary | ICD-10-CM | POA: Diagnosis not present

## 2021-03-01 DIAGNOSIS — F419 Anxiety disorder, unspecified: Secondary | ICD-10-CM | POA: Diagnosis present

## 2021-03-01 DIAGNOSIS — Z79899 Other long term (current) drug therapy: Secondary | ICD-10-CM | POA: Diagnosis not present

## 2021-03-01 DIAGNOSIS — L89323 Pressure ulcer of left buttock, stage 3: Secondary | ICD-10-CM | POA: Diagnosis present

## 2021-03-01 DIAGNOSIS — I10 Essential (primary) hypertension: Secondary | ICD-10-CM | POA: Diagnosis present

## 2021-03-01 DIAGNOSIS — K219 Gastro-esophageal reflux disease without esophagitis: Secondary | ICD-10-CM | POA: Diagnosis present

## 2021-03-01 LAB — BASIC METABOLIC PANEL
Anion gap: 11 (ref 5–15)
BUN: <5 mg/dL — ABNORMAL LOW (ref 8–23)
CO2: 27 mmol/L (ref 22–32)
Calcium: 8.6 mg/dL — ABNORMAL LOW (ref 8.9–10.3)
Chloride: 101 mmol/L (ref 98–111)
Creatinine, Ser: 0.81 mg/dL (ref 0.44–1.00)
GFR, Estimated: >60 mL/min (ref >60)
Glucose, Bld: 129 mg/dL — ABNORMAL HIGH (ref 70–99)
Potassium: 3.7 mmol/L (ref 3.5–5.1)
Sodium: 139 mmol/L (ref 135–145)

## 2021-03-01 LAB — CBC
HCT: 38.7 % (ref 36.0–46.0)
Hemoglobin: 11.5 g/dL — ABNORMAL LOW (ref 12.0–15.0)
MCH: 25.7 pg — ABNORMAL LOW (ref 26.0–34.0)
MCHC: 29.7 g/dL — ABNORMAL LOW (ref 30.0–36.0)
MCV: 86.4 fL (ref 80.0–100.0)
Platelets: 384 10*3/uL (ref 150–400)
RBC: 4.48 MIL/uL (ref 3.87–5.11)
RDW: 19.4 % — ABNORMAL HIGH (ref 11.5–15.5)
WBC: 7.3 10*3/uL (ref 4.0–10.5)
nRBC: 0 % (ref 0.0–0.2)

## 2021-03-01 LAB — PHOSPHORUS: Phosphorus: 5.1 mg/dL — ABNORMAL HIGH (ref 2.5–4.6)

## 2021-03-01 LAB — HIV ANTIBODY (ROUTINE TESTING W REFLEX): HIV Screen 4th Generation wRfx: NONREACTIVE

## 2021-03-01 LAB — MAGNESIUM: Magnesium: 2.5 mg/dL — ABNORMAL HIGH (ref 1.7–2.4)

## 2021-03-01 MED ORDER — METHYLPREDNISOLONE SODIUM SUCC 125 MG IJ SOLR
60.0000 mg | Freq: Four times a day (QID) | INTRAMUSCULAR | Status: DC
  Administered 2021-03-01 – 2021-03-02 (×3): 60 mg via INTRAVENOUS
  Filled 2021-03-01 (×3): qty 2

## 2021-03-01 MED ORDER — HYDROCODONE-ACETAMINOPHEN 5-325 MG PO TABS
1.0000 | ORAL_TABLET | Freq: Four times a day (QID) | ORAL | Status: DC | PRN
  Administered 2021-03-01 – 2021-03-06 (×11): 1 via ORAL
  Filled 2021-03-01 (×12): qty 1

## 2021-03-01 MED ORDER — GABAPENTIN 100 MG PO CAPS
100.0000 mg | ORAL_CAPSULE | Freq: Three times a day (TID) | ORAL | Status: DC
  Administered 2021-03-01 – 2021-03-06 (×15): 100 mg via ORAL
  Filled 2021-03-01 (×15): qty 1

## 2021-03-01 MED ORDER — BUPROPION HCL ER (XL) 150 MG PO TB24
150.0000 mg | ORAL_TABLET | Freq: Every evening | ORAL | Status: DC
  Administered 2021-03-01 – 2021-03-05 (×5): 150 mg via ORAL
  Filled 2021-03-01 (×6): qty 1

## 2021-03-01 MED ORDER — ENSURE ENLIVE PO LIQD
237.0000 mL | Freq: Two times a day (BID) | ORAL | Status: DC
  Administered 2021-03-02 – 2021-03-06 (×8): 237 mL via ORAL

## 2021-03-01 MED ORDER — ALBUTEROL SULFATE (2.5 MG/3ML) 0.083% IN NEBU: 5.0000 mg | INHALATION_SOLUTION | RESPIRATORY_TRACT | Status: DC | PRN

## 2021-03-01 MED ORDER — METHYLPREDNISOLONE SODIUM SUCC 40 MG IJ SOLR
40.0000 mg | Freq: Two times a day (BID) | INTRAMUSCULAR | Status: DC
  Administered 2021-03-01: 40 mg via INTRAVENOUS
  Filled 2021-03-01: qty 1

## 2021-03-01 MED ORDER — IPRATROPIUM-ALBUTEROL 0.5-2.5 (3) MG/3ML IN SOLN
3.0000 mL | Freq: Four times a day (QID) | RESPIRATORY_TRACT | Status: DC
  Administered 2021-03-01: 3 mL via RESPIRATORY_TRACT
  Filled 2021-03-01 (×2): qty 3

## 2021-03-01 MED ORDER — VENLAFAXINE HCL ER 75 MG PO CP24
150.0000 mg | ORAL_CAPSULE | Freq: Every day | ORAL | Status: DC
  Administered 2021-03-01 – 2021-03-06 (×6): 150 mg via ORAL
  Filled 2021-03-01 (×3): qty 2
  Filled 2021-03-01: qty 1
  Filled 2021-03-01 (×2): qty 2

## 2021-03-01 MED ORDER — PNEUMOCOCCAL VAC POLYVALENT 25 MCG/0.5ML IJ INJ
0.5000 mL | INJECTION | INTRAMUSCULAR | Status: DC
Start: 2021-03-02 — End: 2021-03-06
  Filled 2021-03-01 (×2): qty 0.5

## 2021-03-01 MED ORDER — IPRATROPIUM-ALBUTEROL 0.5-2.5 (3) MG/3ML IN SOLN: 3.0000 mL | RESPIRATORY_TRACT | Status: DC | PRN

## 2021-03-01 MED ORDER — ALPRAZOLAM 0.5 MG PO TABS
0.5000 mg | ORAL_TABLET | Freq: Three times a day (TID) | ORAL | Status: DC | PRN
  Administered 2021-03-01 – 2021-03-06 (×11): 0.5 mg via ORAL
  Filled 2021-03-01 (×11): qty 1

## 2021-03-01 NOTE — ED Notes (Signed)
Attempted to call report, floor will call back

## 2021-03-01 NOTE — ED Notes (Signed)
Pt satting mid 90s on 6L, decreased pt to 2L via [place], pt satting 94% on 2L, meal tray given.

## 2021-03-01 NOTE — Progress Notes (Signed)
Patient ID: [name], female   DOB: 1955-11-24, 65 y.o.   MRN: [id number]  PROGRESS NOTE    LATAJAH THUMAN  PZW:[id number] DOB: [birth year] DOA: 02/28/2021 PCP: Rodrigo Ran, MD   Brief Narrative:  65 y.o. female with medical history significant for COPD, chronic bilateral lower extremity venous stasis, gout, hypercholesteremia, who presented to [facility] ED with complaints of shortness of breath and productive cough progressively worsening in the past week.  On presentation, her oxygen saturation was 88% on room air.  CTA chest was negative for PE but showed small area of groundglass tree-in-bud opacity in the perihilar right upper lobe likely pneumonitis.  She was started on IV Solu-Medrol.  Assessment & Plan:   Acute hypoxic respiratory failure COPD exacerbation -Currently requiring 6 L oxygen via nasal cannula. - CTA chest was negative for PE but showed small area of groundglass tree-in-bud opacity in the perihilar right upper lobe likely pneumonitis.   -COVID-19 and influenza testing negative on presentation -Increase Solu-Medrol to 60 mg IV every 6 hours as the respiratory status has worsened.  Continue nebs and Dulera.  Continue Zithromax  Chronic bilateral lower extremity venous insufficiency -Follows up with Dr. Lajoyce Corners as an outpatient.  Continue local skin care  Hypothyroidism -Continue levothyroxine  Hyperlipidemia -Continue Lipitor  GERD -Continue PPI  Polyneuropathy -Continue gabapentin  Anxiety/depression -Continue home regimen     DVT prophylaxis: Subcutaneous Lovenox Code Status: Full Family Communication:  Disposition Plan: Status is: Observation  The patient will require care spanning > 2 midnights and should be moved to inpatient because: Inpatient level of care appropriate due to severity of illness  Dispo:  Patient From: Home  Planned Disposition: Home  Medically stable for discharge: No       Consultants: None Procedures: None  Antimicrobials:  Zithromax   Subjective: Patient seen and examined at bedside.  Still complains of shortness of breath and cough.  Does not feel well.  Feels anxious.  No overnight fever or vomiting reported.  Objective: Vitals:   03/01/21 0645 03/01/21 0700 03/01/21 0742 03/01/21 1025  BP: (!) 102/56 110/63 (!) 109/49 108/81  Pulse: 82 77 83 95  Resp: 17 15 18  (!) 22  Temp:   98 F (36.7 C) 98.5 F (36.9 C)  TempSrc:   Oral Oral  SpO2: 94% 95% 95% 92%    Intake/Output Summary (Last 24 hours) at 03/01/2021 1155 Last data filed at 02/28/2021 2245 Gross per 24 hour  Intake 49.95 ml  Output --  Net 49.95 ml   There were no vitals filed for this visit.  Examination:  General exam: Looks chronically ill and deconditioned.  Currently on 6 L oxygen via nasal cannula. Respiratory system: Bilateral decreased breath sounds at bases with scattered crackles Cardiovascular system: S1 & S2 heard, Rate controlled Gastrointestinal system: Abdomen is nondistended, soft and nontender. Normal bowel sounds heard. Extremities: No cyanosis, clubbing; bilateral lower extremity edema present with chronic skin changes. Central nervous system: Alert and oriented. No focal neurological deficits. Moving extremities Skin: No obvious ecchymosis/other lesions. Psychiatry: Looks anxious.  Otherwise mostly flat affect.   Data Reviewed: I have personally reviewed following labs and imaging studies  CBC: Recent Labs  Lab 02/28/21 1325 02/28/21 2333 03/01/21 0331  WBC 9.0 8.1 7.3  NEUTROABS 6.4  --   --   HGB 12.3 11.5* 11.5*  HCT 41.7 37.8 38.7  MCV 87.1 85.3 86.4  PLT 391 361 384   Basic Metabolic Panel: Recent Labs  Lab 02/28/21  1325 02/28/21 2333 03/01/21 0331  NA 138  --  139  K 4.1  --  3.7  CL 101  --  101  CO2 27  --  27  GLUCOSE 91  --  129*  BUN <5*  --  <5*  CREATININE 0.90 0.79 0.81  CALCIUM 8.3*  --  8.6*  MG  --   --  2.5*  PHOS  --   --  5.1*   GFR: CrCl cannot be calculated (Unknown  ideal weight.). Liver Function Tests: Recent Labs  Lab 02/28/21 1325  AST 14*  ALT 10  ALKPHOS 108  BILITOT 0.6  PROT 7.6  ALBUMIN 3.1*   No results for input(s): LIPASE, AMYLASE in the last 168 hours. No results for input(s): AMMONIA in the last 168 hours. Coagulation Profile: Recent Labs  Lab 02/28/21 1325  INR 1.1   Cardiac Enzymes: No results for input(s): CKTOTAL, CKMB, CKMBINDEX, TROPONINI in the last 168 hours. BNP (last 3 results) No results for input(s): PROBNP in the last 8760 hours. HbA1C: No results for input(s): HGBA1C in the last 72 hours. CBG: No results for input(s): GLUCAP in the last 168 hours. Lipid Profile: No results for input(s): CHOL, HDL, LDLCALC, TRIG, CHOLHDL, LDLDIRECT in the last 72 hours. Thyroid Function Tests: No results for input(s): TSH, T4TOTAL, FREET4, T3FREE, THYROIDAB in the last 72 hours. Anemia Panel: No results for input(s): VITAMINB12, FOLATE, FERRITIN, TIBC, IRON, RETICCTPCT in the last 72 hours. Sepsis Labs: Recent Labs  Lab 02/28/21 1325 02/28/21 1925  LATICACIDVEN 1.2 1.0    Recent Results (from the past 240 hour(s))  Resp Panel by RT-PCR (Flu A&B, Covid) Nasopharyngeal Swab     Status: None   Collection Time: 02/28/21  4:38 PM   Specimen: Nasopharyngeal Swab; Nasopharyngeal(NP) swabs in vial transport medium  Result Value Ref Range Status   SARS Coronavirus 2 by RT PCR NEGATIVE NEGATIVE Final    Comment: (NOTE) SARS-CoV-2 target nucleic acids are NOT DETECTED.  The SARS-CoV-2 RNA is generally detectable in upper respiratory specimens during the acute phase of infection. The lowest concentration of SARS-CoV-2 viral copies this assay can detect is 138 copies/mL. A negative result does not preclude SARS-Cov-2 infection and should not be used as the sole basis for treatment or other patient management decisions. A negative result may occur with  improper specimen collection/handling, submission of specimen other than  nasopharyngeal swab, presence of viral mutation(s) within the areas targeted by this assay, and inadequate number of viral copies(<138 copies/mL). A negative result must be combined with clinical observations, patient history, and epidemiological information. The expected result is Negative.  Fact Sheet for Patients:  BloggerCourse.com  Fact Sheet for Healthcare Providers:  SeriousBroker.it  This test is no t yet approved or cleared by the [place] FDA and  has been authorized for detection and/or diagnosis of SARS-CoV-2 by FDA under an Emergency Use Authorization (EUA). This EUA will remain  in effect (meaning this test can be used) for the duration of the COVID-19 declaration under Section 564(b)(1) of the Act, 21 U.S.C.section 360bbb-3(b)(1), unless the authorization is terminated  or revoked sooner.       Influenza A by PCR NEGATIVE NEGATIVE Final   Influenza B by PCR NEGATIVE NEGATIVE Final    Comment: (NOTE) The Xpert Xpress SARS-CoV-2/FLU/RSV plus assay is intended as an aid in the diagnosis of influenza from Nasopharyngeal swab specimens and should not be used as a sole basis for treatment. Nasal washings and aspirates  are unacceptable for Xpert Xpress SARS-CoV-2/FLU/RSV testing.  Fact Sheet for Patients: BloggerCourse.com  Fact Sheet for Healthcare Providers: SeriousBroker.it  This test is not yet approved or cleared by the [place] FDA and has been authorized for detection and/or diagnosis of SARS-CoV-2 by FDA under an Emergency Use Authorization (EUA). This EUA will remain in effect (meaning this test can be used) for the duration of the COVID-19 declaration under Section 564(b)(1) of the Act, 21 U.S.C. section 360bbb-3(b)(1), unless the authorization is terminated or revoked.  Performed at [facility], [address], [place], [place] [address]           Radiology Studies: DG Chest 2 View  Result Date: 02/28/2021 CLINICAL DATA:  Cough and shortness of breath. EXAM: CHEST - 2 VIEW COMPARISON:  02/16/2009 FINDINGS: Midline trachea. Normal heart size and mediastinal contours. No pleural effusion or pneumothorax. Clear lungs. Moderate thoracic spondylosis. Degenerative changes of both shoulders. IMPRESSION: No acute cardiopulmonary disease. Electronically Signed   By: Jeronimo Greaves M.D.   On: 02/28/2021 14:11   CT Angio Chest PE W and/or Wo Contrast  Result Date: 02/28/2021 CLINICAL DATA:  PE suspected, high prob Shortness of breath. EXAM: CT ANGIOGRAPHY CHEST WITH CONTRAST TECHNIQUE: Multidetector CT imaging of the chest was performed using the standard protocol during bolus administration of intravenous contrast. Multiplanar CT image reconstructions and MIPs were obtained to evaluate the vascular anatomy. CONTRAST:  50mL OMNIPAQUE IOHEXOL 350 MG/ML SOLN COMPARISON:  Radiograph earlier today. FINDINGS: Cardiovascular: There are no filling defects within the pulmonary arteries to suggest pulmonary embolus. Thoracic aorta is normal in caliber. Mild aortic atherosclerosis. No aortic dissection. The heart is normal in size. There are coronary artery calcifications. No pericardial effusion. Mediastinum/Nodes: There are calcified mediastinal lymph nodes consistent with prior granulomatous disease. No noncalcified adenopathy. Esophagus mildly patulous without wall thickening. No thyroid nodule. Lungs/Pleura: Central bronchial thickening. Minimal retained mucus in the right mainstem bronchus. Small area of ground-glass and tree in bud opacity in the perihilar right upper lobe, series 7, image 62, likely pneumonitis. Lingular atelectasis. No confluent consolidation. No pleural fluid. No findings of pulmonary edema. No pulmonary mass or suspicious nodule. Upper Abdomen: No acute findings.  Cholecystectomy. Musculoskeletal: Diffuse thoracic spondylosis.  Bilateral glenohumeral osteoarthritis, right greater than left. There are no acute or suspicious osseous abnormalities. Review of the MIP images confirms the above findings. IMPRESSION: 1. No pulmonary embolus. 2. Central bronchial thickening. Small area of ground-glass and tree in bud opacity in the perihilar right upper lobe, likely pneumonitis. 3. Coronary artery calcifications. Aortic Atherosclerosis (ICD10-I70.0). Electronically Signed   By: Narda Rutherford M.D.   On: 02/28/2021 21:52   VAS [place] LOWER EXTREMITY VENOUS (DVT) (ONLY [facility] & [facility] 7a-7p)  Result Date: 02/28/2021  Lower Venous DVT Study Patient Name:  ESTI DEMELLO  Date of Exam:   02/28/2021 Medical Rec #: [id number]      Accession #:    [id number] Date of Birth: 1955-11-10      Patient Gender: F Patient Age:   43Y Exam Location:  [facility] Procedure:      VAS [place] LOWER EXTREMITY VENOUS (DVT) Referring Phys: [id number] SOPHIA CACCAVALE --------------------------------------------------------------------------------  Indications: Pain, Swelling, and Erythema.  Limitations: Edema, skin texture. Comparison Study: Prior negative right LEV done 11/10/13 Performing Technologist: Sherren Kerns RVS  Examination Guidelines: A complete evaluation includes B-mode imaging, spectral Doppler, color Doppler, and power Doppler as needed of all accessible portions of each vessel. Bilateral testing is considered  an integral part of a complete examination. Limited examinations for reoccurring indications may be performed as noted. The reflux portion of the exam is performed with the patient in reverse Trendelenburg.  +---------+---------------+---------+-----------+----------+-------------------+ RIGHT    CompressibilityPhasicitySpontaneityPropertiesThrombus Aging      +---------+---------------+---------+-----------+----------+-------------------+ CFV      Full           Yes      Yes                                       +---------+---------------+---------+-----------+----------+-------------------+ SFJ      Full                                                             +---------+---------------+---------+-----------+----------+-------------------+ FV Prox  Full                                                             +---------+---------------+---------+-----------+----------+-------------------+ FV Mid   Full                                                             +---------+---------------+---------+-----------+----------+-------------------+ FV DistalFull                                                             +---------+---------------+---------+-----------+----------+-------------------+ PFV      Full                                                             +---------+---------------+---------+-----------+----------+-------------------+ POP      Full           Yes      Yes                                      +---------+---------------+---------+-----------+----------+-------------------+ PTV                                                   Not well visualized +---------+---------------+---------+-----------+----------+-------------------+ PERO  Not well visualized +---------+---------------+---------+-----------+----------+-------------------+   +---------+---------------+---------+-----------+----------+--------------+ LEFT     CompressibilityPhasicitySpontaneityPropertiesThrombus Aging +---------+---------------+---------+-----------+----------+--------------+ CFV      Full                                                        +---------+---------------+---------+-----------+----------+--------------+ SFJ      Full                                                        +---------+---------------+---------+-----------+----------+--------------+ FV Prox  Full                                                         +---------+---------------+---------+-----------+----------+--------------+ FV Mid   Full                                                        +---------+---------------+---------+-----------+----------+--------------+ FV DistalFull                                                        +---------+---------------+---------+-----------+----------+--------------+ PFV      Full                                                        +---------+---------------+---------+-----------+----------+--------------+ POP      Full           Yes      Yes                                 +---------+---------------+---------+-----------+----------+--------------+ PTV      Full                                                        +---------+---------------+---------+-----------+----------+--------------+ PERO     Full                                                        +---------+---------------+---------+-----------+----------+--------------+    Summary: RIGHT: - There is no evidence of deep vein thrombosis in the lower extremity. However, portions of this examination were limited- see technologist comments above.  LEFT: - There is  no evidence of deep vein thrombosis in the lower extremity.  *See table(s) above for measurements and observations.    Preliminary         Scheduled Meds:  atorvastatin  10 mg Oral Daily   azithromycin  250 mg Oral Daily   enoxaparin (LOVENOX) injection  40 mg Subcutaneous Q24H   ipratropium-albuterol  3 mL Nebulization Q6H   levothyroxine  125 mcg Oral Q0600   methylPREDNISolone (SOLU-MEDROL) injection  60 mg Intravenous Q6H   mometasone-formoterol  2 puff Inhalation BID   Continuous Infusions:        Glade Lloyd, MD [facility] 03/01/2021, 11:55 AM

## 2021-03-01 NOTE — ED Notes (Signed)
Pt ambulatory to the bathroom with sig assistance, pt has increased sob, pt satting 87% after exertion, 2L replaced and pt now 95%

## 2021-03-01 NOTE — ED Notes (Signed)
Report given to Elizabeth M, RN & Travis, RN   

## 2021-03-02 DIAGNOSIS — J441 Chronic obstructive pulmonary disease with (acute) exacerbation: Secondary | ICD-10-CM | POA: Diagnosis not present

## 2021-03-02 DIAGNOSIS — J9601 Acute respiratory failure with hypoxia: Secondary | ICD-10-CM | POA: Diagnosis not present

## 2021-03-02 MED ORDER — METHYLPREDNISOLONE SODIUM SUCC 125 MG IJ SOLR
80.0000 mg | Freq: Two times a day (BID) | INTRAMUSCULAR | Status: DC
  Administered 2021-03-02 – 2021-03-03 (×2): 80 mg via INTRAVENOUS
  Filled 2021-03-02 (×2): qty 2

## 2021-03-02 NOTE — Consult Note (Signed)
WOC Nurse Consult Note: Reason for Consult: Stage 3 pressure injury Wound type:  history of "boils"  Pressure Injury POA: NA Measurement: small area; scabbed  Wound bed: NA Drainage (amount, consistency, odor) none Periwound: intact  Dressing procedure/placement/frequency: Continue silicone foam per skin care order set.   Addition: staff mentioned LEs. She has venous stasis skin changes, no open wounds. No topical care needed.   Discussed POC with patient and bedside nurse.  Re consult if needed, will not follow at this time. Thanks  Brandace Cargle [employer], RN,CWOCN, CNS, CWON-AP [phone])

## 2021-03-02 NOTE — Evaluation (Signed)
Clinical/Bedside Swallow Evaluation Patient Details  Name: [name] MRN: [id number] Date of Birth: [birth year]  Today's Date: 03/02/2021 Time: SLP Start Time (ACUTE ONLY): 1010 SLP Stop Time (ACUTE ONLY): 1028 SLP Time Calculation (min) (ACUTE ONLY): 18 min  Past Medical History:  Past Medical History:  Diagnosis Date   COPD (chronic obstructive pulmonary disease) (HCC)    Gout    Hypercholesteremia    Past Surgical History:  Past Surgical History:  Procedure Laterality Date   ABDOMINAL HYSTERECTOMY     ROTATOR CUFF REPAIR     HPI:  Pt is a 65 y.o. female who presented to [facility] ED with complaints of shortness of breath and productive cough progressively worsening in the week prior to admission. CTA chest was negative for PE but showed small area of ground-glass tree-in-bud opacity in the perihilar right upper lobe likely pneumonitis. PMH: COPD, GERD, chronic bilateral lower extremity venous stasis, gout, hypercholesteremia.   Assessment / Plan / Recommendation Clinical Impression  Pt was seen for bedside swallow evaluation and she denied a history of dysphagia. Oral mechanism exam was [facility]. Pt was edentulous; she reported that her dentures need to be fixed and that she has been eating without them for many months. Pt denied any difficulty with foods/liquids since admission. Pt tolerated all solids and liquids without signs or symptoms of oropharyngeal dysphagia. Mastication was WNL despite edentulous status. A regular texture diet with thin liquids is recommended at this time and further skilled SLP services are not clinically indicated for swallowing. SLP Visit Diagnosis: Dysphagia, unspecified (R13.10)    Aspiration Risk  Mild aspiration risk    Diet Recommendation Regular;Thin liquid   Liquid Administration via: Cup;Straw Medication Administration: Whole meds with liquid Supervision: Patient able to self feed    Other  Recommendations Oral Care Recommendations: Oral care BID    Follow up Recommendations None      Frequency and Duration            Prognosis Prognosis for Safe Diet Advancement: Good      Swallow Study   General Date of Onset: 03/01/21 HPI: Pt is a 65 y.o. female who presented to [facility] ED with complaints of shortness of breath and productive cough progressively worsening in the week prior to admission. CTA chest was negative for PE but showed small area of ground-glass tree-in-bud opacity in the perihilar right upper lobe likely pneumonitis. PMH: COPD, GERD, chronic bilateral lower extremity venous stasis, gout, hypercholesteremia. Type of Study: Bedside Swallow Evaluation Previous Swallow Assessment: none Diet Prior to this Study: Regular;Thin liquids Temperature Spikes Noted: No Respiratory Status: Nasal cannula History of Recent Intubation: No Behavior/Cognition: Alert;Cooperative;Pleasant mood Oral Cavity Assessment: Within Functional Limits Oral Care Completed by SLP: No Vision: Functional for self-feeding Self-Feeding Abilities: Able to feed self Patient Positioning: Upright in bed;Postural control adequate for testing Baseline Vocal Quality: Normal Volitional Cough: Strong Volitional Swallow: Able to elicit    Oral/Motor/Sensory Function Overall Oral Motor/Sensory Function: Within functional limits   Ice Chips Ice chips: Within functional limits Presentation: Spoon   Thin Liquid Thin Liquid: Within functional limits Presentation: Straw    Nectar Thick Nectar Thick Liquid: Not tested   Honey Thick Honey Thick Liquid: Not tested   Puree Puree: Within functional limits Presentation: Spoon   Solid     Solid: Within functional limits Presentation: Self Fed     Alaura Schippers I. [employer], MS, CCC-SLP Acute Rehabilitation Services Office number [phone] Pager [phone]  Scheryl Marten 03/02/2021,10:31 AM

## 2021-03-02 NOTE — Progress Notes (Signed)
Patient ID: [name], female   DOB: [birth year], 65 y.o.   MRN: [id number]  PROGRESS NOTE    [name]  WUJ:[id number] DOB: [birth year] DOA: 02/28/2021 PCP: Rodrigo RanPerini, Mark, MD   Brief Narrative:  65 y.o. female with medical history significant for COPD, chronic bilateral lower extremity venous stasis, gout, hypercholesteremia, who presented [facility] with complaints of shortness of breath and productive cough progressively worsening in the past week.  On presentation, her oxygen saturation was 88% on room air.  CTA chest was negative for PE but showed small area of groundglass tree-in-bud opacity in the perihilar right upper lobe likely pneumonitis.  She was started on IV Solu-Medrol.  Assessment & Plan:   Acute hypoxic respiratory failure COPD exacerbation -Currently still requiring 4 L oxygen via nasal cannula.  Wean off as able - CTA chest was negative for PE but showed small area of groundglass tree-in-bud opacity in the perihilar right upper lobe likely pneumonitis.   -COVID-19 and influenza testing negative on presentation -Decrease Solu-Medrol to 40 mg IV every 6 hours as the respiratory status has worsened.  Continue nebs and Dulera.  Continue Zithromax  Chronic bilateral lower extremity venous insufficiency -Follows up with Dr. Lajoyce Cornersuda as an outpatient.  Continue local skin care  Hypothyroidism -Continue levothyroxine  Hyperlipidemia -Continue Lipitor  GERD -Continue PPI  Polyneuropathy -Continue gabapentin  Anxiety/depression -Continue home regimen  General deconditioning -PT eval     DVT prophylaxis: Subcutaneous Lovenox Code Status: Full Family Communication:  Disposition Plan: Status is: Inpatient because: Inpatient level of care appropriate due to severity of illness  Dispo:  Patient From: Home  Planned Disposition: Home in 1 to 2 days if clinically improves  Medically stable for discharge: No       Consultants: None Procedures:  None  Antimicrobials: Zithromax   Subjective: Patient seen and examined at bedside.  No overnight fever, vomiting or chest pain reported.  Still short of breath with exertion.  Does not feel ready to go home today. Objective: Vitals:   03/01/21 2037 03/02/21 0159 03/02/21 0347 03/02/21 0856  BP: (!) 116/50  118/61   Pulse: 91  81   Resp:   20   Temp: 98.5 F (36.9 C)  97.7 F (36.5 C)   TempSrc: Oral  Oral   SpO2: 92%  95% 93%  Weight:  89.9 kg    Height:        Intake/Output Summary (Last 24 hours) at 03/02/2021 1034 Last data filed at 03/02/2021 0348 Gross per 24 hour  Intake 500 ml  Output 1 ml  Net 499 ml    Filed Weights   03/01/21 1610 03/02/21 0159  Weight: 89.9 kg 89.9 kg    Examination:  General exam: Looks chronically ill and deconditioned.  On 4 L oxygen via nasal cannula.  No acute distress.   Rrespiratory system: Decreased breath sounds at bases bilaterally with some crackles heard  cardiovascular system: Rate controlled, S1-S2 heard Gastrointestinal system: Abdomen is slightly distended, soft and nontender.  Bowel sounds are heard Extremities: Trace bilateral lower extremity edema present with chronic skin changes.  No clubbing Central nervous system: Awake and alert.  Slow to respond.  Poor historian. No focal neurological deficits. Moving extremities Skin: No obvious petechiae/ecchymosis Psychiatry: Anxious looking intermittently.  Otherwise flat affect.   Data Reviewed: I have personally reviewed following labs and imaging studies  CBC: Recent Labs  Lab 02/28/21 1325 02/28/21 2333 03/01/21 0331  WBC 9.0 8.1 7.3  NEUTROABS 6.4  --   --   HGB 12.3 11.5* 11.5*  HCT 41.7 37.8 38.7  MCV 87.1 85.3 86.4  PLT 391 361 384    Basic Metabolic Panel: Recent Labs  Lab 02/28/21 1325 02/28/21 2333 03/01/21 0331  NA 138  --  139  K 4.1  --  3.7  CL 101  --  101  CO2 27  --  27  GLUCOSE 91  --  129*  BUN <5*  --  <5*  CREATININE 0.90 0.79 0.81   CALCIUM 8.3*  --  8.6*  MG  --   --  2.5*  PHOS  --   --  5.1*    GFR: Estimated Creatinine Clearance: 76.2 mL/min (by C-G formula based on SCr of 0.81 mg/dL). Liver Function Tests: Recent Labs  Lab 02/28/21 1325  AST 14*  ALT 10  ALKPHOS 108  BILITOT 0.6  PROT 7.6  ALBUMIN 3.1*    No results for input(s): LIPASE, AMYLASE in the last 168 hours. No results for input(s): AMMONIA in the last 168 hours. Coagulation Profile: Recent Labs  Lab 02/28/21 1325  INR 1.1    Cardiac Enzymes: No results for input(s): CKTOTAL, CKMB, CKMBINDEX, TROPONINI in the last 168 hours. BNP (last 3 results) No results for input(s): PROBNP in the last 8760 hours. HbA1C: No results for input(s): HGBA1C in the last 72 hours. CBG: No results for input(s): GLUCAP in the last 168 hours. Lipid Profile: No results for input(s): CHOL, HDL, LDLCALC, TRIG, CHOLHDL, LDLDIRECT in the last 72 hours. Thyroid Function Tests: No results for input(s): TSH, T4TOTAL, FREET4, T3FREE, THYROIDAB in the last 72 hours. Anemia Panel: No results for input(s): VITAMINB12, FOLATE, FERRITIN, TIBC, IRON, RETICCTPCT in the last 72 hours. Sepsis Labs: Recent Labs  Lab 02/28/21 1325 02/28/21 1925  LATICACIDVEN 1.2 1.0     Recent Results (from the past 240 hour(s))  Blood culture (routine x 2)     Status: None (Preliminary result)   Collection Time: 02/28/21  1:25 PM   Specimen: BLOOD  Result Value Ref Range Status   Specimen Description BLOOD SITE NOT SPECIFIED  Final   Special Requests   Final    BOTTLES DRAWN AEROBIC AND ANAEROBIC Blood Culture adequate volume   Culture   Final    NO GROWTH 2 DAYS Performed at [facility], [address] [place], [place] [address]    Report Status PENDING  Incomplete  Resp Panel by RT-PCR (Flu A&B, Covid) Nasopharyngeal Swab     Status: None   Collection Time: 02/28/21  4:38 PM   Specimen: Nasopharyngeal Swab; Nasopharyngeal(NP) swabs in vial transport medium   Result Value Ref Range Status   SARS Coronavirus 2 by RT PCR NEGATIVE NEGATIVE Final    Comment: (NOTE) SARS-CoV-2 target nucleic acids are NOT DETECTED.  The SARS-CoV-2 RNA is generally detectable in upper respiratory specimens during the acute phase of infection. The lowest concentration of SARS-CoV-2 viral copies this assay can detect is 138 copies/mL. A negative result does not preclude SARS-Cov-2 infection and should not be used as the sole basis for treatment or other patient management decisions. A negative result may occur with  improper specimen collection/handling, submission of specimen other than nasopharyngeal swab, presence of viral mutation(s) within the areas targeted by this assay, and inadequate number of viral copies(<138 copies/mL). A negative result must be combined with clinical observations, patient history, and epidemiological information. The expected result is Negative.  Fact Sheet for  Patients:  BloggerCourse.com  Fact Sheet for Healthcare Providers:  SeriousBroker.it  This test is no t yet approved or cleared by the [place] FDA and  has been authorized for detection and/or diagnosis of SARS-CoV-2 by FDA under an Emergency Use Authorization (EUA). This EUA will remain  in effect (meaning this test can be used) for the duration of the COVID-19 declaration under Section 564(b)(1) of the Act, 21 U.S.C.section 360bbb-3(b)(1), unless the authorization is terminated  or revoked sooner.       Influenza A by PCR NEGATIVE NEGATIVE Final   Influenza B by PCR NEGATIVE NEGATIVE Final    Comment: (NOTE) The Xpert Xpress SARS-CoV-2/FLU/RSV plus assay is intended as an aid in the diagnosis of influenza from Nasopharyngeal swab specimens and should not be used as a sole basis for treatment. Nasal washings and aspirates are unacceptable for Xpert Xpress SARS-CoV-2/FLU/RSV testing.  Fact Sheet for  Patients: BloggerCourse.com  Fact Sheet for Healthcare Providers: SeriousBroker.it  This test is not yet approved or cleared by the [place] FDA and has been authorized for detection and/or diagnosis of SARS-CoV-2 by FDA under an Emergency Use Authorization (EUA). This EUA will remain in effect (meaning this test can be used) for the duration of the COVID-19 declaration under Section 564(b)(1) of the Act, 21 U.S.C. section 360bbb-3(b)(1), unless the authorization is terminated or revoked.  Performed at [facility], [address] [place], [place] [address]   Blood culture (routine x 2)     Status: None (Preliminary result)   Collection Time: 02/28/21  5:32 PM   Specimen: BLOOD  Result Value Ref Range Status   Specimen Description BLOOD LEFT ANTECUBITAL  Final   Special Requests   Final    BOTTLES DRAWN AEROBIC AND ANAEROBIC Blood Culture adequate volume   Culture   Final    NO GROWTH 2 DAYS Performed at [facility], [address], [place], [place] [address]    Report Status PENDING  Incomplete          Radiology Studies: DG Chest 2 View  Result Date: 02/28/2021 CLINICAL DATA:  Cough and shortness of breath. EXAM: CHEST - 2 VIEW COMPARISON:  02/16/2009 FINDINGS: Midline trachea. Normal heart size and mediastinal contours. No pleural effusion or pneumothorax. Clear lungs. Moderate thoracic spondylosis. Degenerative changes of both shoulders. IMPRESSION: No acute cardiopulmonary disease. Electronically Signed   By: Jeronimo Greaves M.D.   On: 02/28/2021 14:11   CT Angio Chest PE W and/or Wo Contrast  Result Date: 02/28/2021 CLINICAL DATA:  PE suspected, high prob Shortness of breath. EXAM: CT ANGIOGRAPHY CHEST WITH CONTRAST TECHNIQUE: Multidetector CT imaging of the chest was performed using the standard protocol during bolus administration of intravenous contrast. Multiplanar CT image reconstructions and MIPs  were obtained to evaluate the vascular anatomy. CONTRAST:  65mL OMNIPAQUE IOHEXOL 350 MG/ML SOLN COMPARISON:  Radiograph earlier today. FINDINGS: Cardiovascular: There are no filling defects within the pulmonary arteries to suggest pulmonary embolus. Thoracic aorta is normal in caliber. Mild aortic atherosclerosis. No aortic dissection. The heart is normal in size. There are coronary artery calcifications. No pericardial effusion. Mediastinum/Nodes: There are calcified mediastinal lymph nodes consistent with prior granulomatous disease. No noncalcified adenopathy. Esophagus mildly patulous without wall thickening. No thyroid nodule. Lungs/Pleura: Central bronchial thickening. Minimal retained mucus in the right mainstem bronchus. Small area of ground-glass and tree in bud opacity in the perihilar right upper lobe, series 7, image 62, likely pneumonitis. Lingular atelectasis. No confluent consolidation.  No pleural fluid. No findings of pulmonary edema. No pulmonary mass or suspicious nodule. Upper Abdomen: No acute findings.  Cholecystectomy. Musculoskeletal: Diffuse thoracic spondylosis. Bilateral glenohumeral osteoarthritis, right greater than left. There are no acute or suspicious osseous abnormalities. Review of the MIP images confirms the above findings. IMPRESSION: 1. No pulmonary embolus. 2. Central bronchial thickening. Small area of ground-glass and tree in bud opacity in the perihilar right upper lobe, likely pneumonitis. 3. Coronary artery calcifications. Aortic Atherosclerosis (ICD10-I70.0). Electronically Signed   By: Narda Rutherford M.D.   On: 02/28/2021 21:52   VAS [place] LOWER EXTREMITY VENOUS (DVT) (ONLY [facility] & [facility] 7a-7p)  Result Date: 02/28/2021  Lower Venous DVT Study Patient Name:  CANDA PODGORSKI  Date of Exam:   02/28/2021 Medical Rec #: [id number]      Accession #:    [id number] Date of Birth: Jun 06, 1956      Patient Gender: F Patient Age:   50Y Exam Location:  [facility] Procedure:      VAS  [place] LOWER EXTREMITY VENOUS (DVT) Referring Phys: [id number] SOPHIA CACCAVALE --------------------------------------------------------------------------------  Indications: Pain, Swelling, and Erythema.  Limitations: Edema, skin texture. Comparison Study: Prior negative right LEV done 11/10/13 Performing Technologist: Sherren Kerns RVS  Examination Guidelines: A complete evaluation includes B-mode imaging, spectral Doppler, color Doppler, and power Doppler as needed of all accessible portions of each vessel. Bilateral testing is considered an integral part of a complete examination. Limited examinations for reoccurring indications may be performed as noted. The reflux portion of the exam is performed with the patient in reverse Trendelenburg.  +---------+---------------+---------+-----------+----------+-------------------+ RIGHT    CompressibilityPhasicitySpontaneityPropertiesThrombus Aging      +---------+---------------+---------+-----------+----------+-------------------+ CFV      Full           Yes      Yes                                      +---------+---------------+---------+-----------+----------+-------------------+ SFJ      Full                                                             +---------+---------------+---------+-----------+----------+-------------------+ FV Prox  Full                                                             +---------+---------------+---------+-----------+----------+-------------------+ FV Mid   Full                                                             +---------+---------------+---------+-----------+----------+-------------------+ FV DistalFull                                                             +---------+---------------+---------+-----------+----------+-------------------+  PFV      Full                                                              +---------+---------------+---------+-----------+----------+-------------------+ POP      Full           Yes      Yes                                      +---------+---------------+---------+-----------+----------+-------------------+ PTV                                                   Not well visualized +---------+---------------+---------+-----------+----------+-------------------+ PERO                                                  Not well visualized +---------+---------------+---------+-----------+----------+-------------------+   +---------+---------------+---------+-----------+----------+--------------+ LEFT     CompressibilityPhasicitySpontaneityPropertiesThrombus Aging +---------+---------------+---------+-----------+----------+--------------+ CFV      Full                                                        +---------+---------------+---------+-----------+----------+--------------+ SFJ      Full                                                        +---------+---------------+---------+-----------+----------+--------------+ FV Prox  Full                                                        +---------+---------------+---------+-----------+----------+--------------+ FV Mid   Full                                                        +---------+---------------+---------+-----------+----------+--------------+ FV DistalFull                                                        +---------+---------------+---------+-----------+----------+--------------+ PFV      Full                                                        +---------+---------------+---------+-----------+----------+--------------+  POP      Full           Yes      Yes                                 +---------+---------------+---------+-----------+----------+--------------+ PTV      Full                                                         +---------+---------------+---------+-----------+----------+--------------+ PERO     Full                                                        +---------+---------------+---------+-----------+----------+--------------+    Summary: RIGHT: - There is no evidence of deep vein thrombosis in the lower extremity. However, portions of this examination were limited- see technologist comments above.  LEFT: - There is no evidence of deep vein thrombosis in the lower extremity.  *See table(s) above for measurements and observations.    Preliminary         Scheduled Meds:  atorvastatin  10 mg Oral Daily   azithromycin  250 mg Oral Daily   buPROPion  150 mg Oral QPM   enoxaparin (LOVENOX) injection  40 mg Subcutaneous Q24H   feeding supplement  237 mL Oral BID BM   gabapentin  100 mg Oral TID   levothyroxine  125 mcg Oral Q0600   methylPREDNISolone (SOLU-MEDROL) injection  60 mg Intravenous Q6H   mometasone-formoterol  2 puff Inhalation BID   pneumococcal 23 valent vaccine  0.5 mL Intramuscular Tomorrow-1000   venlafaxine XR  150 mg Oral Daily   Continuous Infusions:        Glade Lloyd, MD [facility] 03/02/2021, 10:34 AM

## 2021-03-02 NOTE — Plan of Care (Signed)
  Problem: Activity: Goal: Ability to tolerate increased activity will improve Outcome: Progressing   Problem: Respiratory: Goal: Ability to maintain a clear airway will improve Outcome: Progressing   

## 2021-03-02 NOTE — Evaluation (Addendum)
Physical Therapy Evaluation Patient Details Name: [name] MRN: [id number] DOB: [birth year] Today's Date: 03/02/2021   History of Present Illness  65 yo female with onset of SOB and cough with LT history of smoking was admitted wiht new O2 use.  Pt has suspectee pneumonitis, PMHx:  LE venous insufficiency, hypothyroidism, polyneuorpathy, depression, anxiety  Clinical Impression  Pt was seen for mobility on RW with help to manage her safety of sidestepping, as pt is unfamiliar with the device and getting low sats at rest.  89% in bed, 4L O2 on HFNC, but in standing and sidestepping was 94%.  Pt is a bit tired from the effort so will try steps next visit based on how she follows up with therapy.  Will ask for HHPT with help at home but if no help can be found, may need to reconsider for SNF very briefly.    Follow Up Recommendations Home health PT;Supervision for mobility/OOB    Equipment Recommendations  Rolling walker with 5" wheels    Recommendations for Other Services       Precautions / Restrictions Precautions Precautions: Fall Precaution Comments: monitor O2 and pulse Restrictions Weight Bearing Restrictions: No Other Position/Activity Restrictions: on 4L O2 with HFNC      Mobility  Bed Mobility Overal bed mobility: Needs Assistance Bed Mobility: Supine to Sit;Sit to Supine     Supine to sit: Min guard Sit to supine: Min assist   General bed mobility comments: min assist to lift legs to bed but pt may be able to practice and perform this    Transfers Overall transfer level: Needs assistance Equipment used: Rolling walker (2 wheeled);1 person hand held assist Transfers: Sit to/from Stand Sit to Stand: Min guard         General transfer comment: min guard on RW to steady initially  Ambulation/Gait Ambulation/Gait assistance: Min guard Gait Distance (Feet): 12 Feet Assistive device: Rolling walker (2 wheeled);1 person hand held assist Gait  Pattern/deviations: Step-through pattern;Step-to pattern;Decreased stride length;Narrow base of support (sidestepping) Gait velocity: reduced Gait velocity interpretation: <1.31 ft/sec, indicative of household Insurance account manager [employer] (Stroke Patients Only)       Balance Overall balance assessment: Needs assistance Sitting-balance support: Feet supported Sitting balance-Leahy Scale: Good     Standing balance support: Bilateral upper extremity supported;During functional activity Standing balance-Leahy Scale: Fair Standing balance comment: less than fair dynamically                             Pertinent Vitals/Pain Pain Assessment: Faces Faces Pain Scale: Hurts little more Pain Location: legs with movement Pain Descriptors / Indicators: Guarding Pain Intervention(s): Limited activity within patient's tolerance;Monitored during session;Repositioned    Home Living Family/patient expects to be discharged to:: Private residence Living Arrangements: Alone Available Help at Discharge: Family;Friend(s);Personal care attendant;Available PRN/intermittently Type of Home: Mobile home Home Access: Stairs to enter Entrance Stairs-Rails: Can reach both Entrance Stairs-Number of Steps: 4 Home Layout: One level Home Equipment: Walker - 4 wheels;Shower seat Additional Comments: pt    Prior Function Level of Independence: Independent with assistive device(s)         Comments: using rollator typically but feels it is too limiting for her     Hand Dominance   Dominant Hand: Right    Extremity/Trunk Assessment   Upper Extremity Assessment Upper Extremity  Assessment: Overall WFL for tasks assessed    Lower Extremity Assessment Lower Extremity Assessment: Generalized weakness    Cervical / Trunk Assessment Cervical / Trunk Assessment: Normal  Communication   Communication: No difficulties  Cognition  Arousal/Alertness: Awake/alert Behavior During Therapy: WFL for tasks assessed/performed Overall Cognitive Status: Within Functional Limits for tasks assessed                                 General Comments: following instructions for tasks, including MM testing but requires some repetition      General Comments General comments (skin integrity, edema, etc.): Pt is up to side of bed and assisted her to stand and sidestep.  Her use of RW was helpful, but also learned to control it to sidestep    Exercises     Assessment/Plan    PT Assessment Patient needs continued PT services  PT Problem List Decreased strength;Decreased range of motion;Decreased activity tolerance;Decreased balance;Decreased mobility;Decreased coordination;Decreased safety awareness;Decreased knowledge of use of DME;Decreased skin integrity;Pain       PT Treatment Interventions DME instruction;Gait training;Stair training;Functional mobility training;Therapeutic activities;Therapeutic exercise;Balance training;Neuromuscular re-education;Patient/family education    PT Goals (Current goals can be found in the Care Plan section)  Acute Rehab PT Goals Patient Stated Goal: to get home and get a new walker PT Goal Formulation: With patient Time For Goal Achievement: 03/16/21 Potential to Achieve Goals: Good    Frequency Min 3X/week   Barriers to discharge Inaccessible home environment;Decreased caregiver support home alone with stairs to enter    Co-evaluation               AM-PAC PT "6 Clicks" Mobility  Outcome Measure Help needed turning from your back to your side while in a flat bed without using bedrails?: A Little Help needed moving from lying on your back to sitting on the side of a flat bed without using bedrails?: A Little Help needed moving to and from a bed to a chair (including a wheelchair)?: A Little Help needed standing up from a chair using your arms (e.g., wheelchair or  bedside chair)?: A Little Help needed to walk in hospital room?: A Little Help needed climbing 3-5 steps with a railing? : A Lot 6 Click Score: 17    End of Session Equipment Utilized During Treatment: Gait belt;Oxygen Activity Tolerance: Patient tolerated treatment well;Patient limited by fatigue Patient left: in bed;with call bell/phone within reach;with bed alarm set (did not have a chair alarm pad to leave OOB, notified nursig) Nurse Communication: Mobility status (pt is still in bed over safety devices) PT Visit Diagnosis: Unsteadiness on feet (R26.81);Muscle weakness (generalized) (M62.81);Pain Pain - Right/Left:  (both) Pain - part of body: Ankle and joints of foot;Leg    Time: 5364-6803 PT Time Calculation (min) (ACUTE ONLY): 27 min   Charges:   PT Evaluation $PT Eval Moderate Complexity: 1 Mod PT Treatments $Gait Training: 8-22 mins       Ivar Drape 03/02/2021, 12:02 PM  Samul Dada, PT MS Acute Rehab Dept. Number: [facility] [id number] and [facility] [phone]

## 2021-03-03 DIAGNOSIS — J441 Chronic obstructive pulmonary disease with (acute) exacerbation: Secondary | ICD-10-CM | POA: Diagnosis not present

## 2021-03-03 MED ORDER — PANTOPRAZOLE SODIUM 40 MG PO TBEC
40.0000 mg | DELAYED_RELEASE_TABLET | Freq: Every day | ORAL | Status: DC
  Administered 2021-03-03 – 2021-03-06 (×4): 40 mg via ORAL
  Filled 2021-03-03 (×3): qty 1

## 2021-03-03 MED ORDER — METHYLPREDNISOLONE SODIUM SUCC 40 MG IJ SOLR: 40.0000 mg | Freq: Four times a day (QID) | INTRAMUSCULAR | Status: DC

## 2021-03-03 MED ORDER — METHYLPREDNISOLONE SODIUM SUCC 125 MG IJ SOLR
60.0000 mg | Freq: Two times a day (BID) | INTRAMUSCULAR | Status: DC
  Administered 2021-03-03: 60 mg via INTRAVENOUS
  Filled 2021-03-03: qty 2

## 2021-03-03 MED ORDER — ALUM & MAG HYDROXIDE-SIMETH 200-200-20 MG/5ML PO SUSP
30.0000 mL | Freq: Four times a day (QID) | ORAL | Status: DC | PRN
  Administered 2021-03-03: 30 mL via ORAL
  Filled 2021-03-03: qty 30

## 2021-03-03 NOTE — Progress Notes (Signed)
Initial Nutrition Assessment  DOCUMENTATION CODES:   Obesity unspecified  INTERVENTION:  -Continue Ensure Enlive po BID, each supplement provides 350 kcal and 20 grams of protein  NUTRITION DIAGNOSIS:   Increased nutrient needs related to chronic illness, wound healing (COPD exacerbation) as evidenced by estimated needs.  GOAL:   Patient will meet greater than or equal to 90% of their needs  MONITOR:   PO intake, Supplement acceptance, Skin, Weight trends, Labs, I & O's  REASON FOR ASSESSMENT:   Malnutrition Screening Tool    ASSESSMENT:   Pt with PMH significant for COPD, chronic bilateral lower extremity venous stasis, gout, hypercholesteremia admitted with acute hypoxic respiratory failure 2/2 COPD exacerbation.  Pt unavailable at time of RD visit. Pt noted to be a very poor historian. per RN, continues to be very short of breath with minimal exertion.  Limited weight history available for review. Per weight readings, pt with insignificant 6.9% weight loss over last 11 months.   Non-pitting edema to BLE per RN assessment.   PO intake: 20-75% x 2 recorded meals Pt with orders for Ensure Enlive/Plus BID  Medications: solu-medrol Labs: PO4 5.1 (H), Mg 2.5 (H)  Diet Order:   Diet Order             Diet regular Room service appropriate? Yes; Fluid consistency: Thin; Fluid restriction: 1500 mL Fluid  Diet effective now                   EDUCATION NEEDS:   No education needs have been identified at this time  Skin:  Skin Assessment: Skin Integrity Issues: Skin Integrity Issues:: Stage III Stage III: L buttocks  Last BM:  7/31  Height:   Ht Readings from Last 1 Encounters:  03/01/21 5\' 4"  (1.626 m)    Weight:   Wt Readings from Last 10 Encounters:  03/03/21 89.9 kg  04/28/20 96.6 kg  04/05/18 96.8 kg  01/18/18 84.8 kg  12/20/06 (!) 104.8 kg   BMI:  Body mass index is 34.02 kg/m.  Estimated Nutritional Needs:   Kcal:   1750-1950  Protein:  90-110 grams  Fluid:  >2L    12/22/06, MS, RD, LDN (she/her/hers) RD pager number and weekend/on-call pager number located in Amion.

## 2021-03-03 NOTE — Progress Notes (Signed)
Physical Therapy Treatment Patient Details Name: [name] MRN: [id number] DOB: March 14, 1956 Today's Date: 03/03/2021    History of Present Illness 65 yo female admitted 7/30 secondary to SOB and cough.  Thought to be secondary to COPD exacerbation. PMHx:  LE venous insufficiency, hypothyroidism, polyneuorpathy, depression, anxiety    PT Comments    Pt progressing towards goals. Able to increase ambulation distance, however, remains limited secondary to fatigue. Required min guard A for mobility tasks using RW. Performed HEP as well, however, had poor tolerance for R knee ROM secondary to pain. Current recommendations appropriate. Will continue to follow acutely.     Follow Up Recommendations  Home health PT;Supervision for mobility/OOB     Equipment Recommendations  Rolling walker with 5" wheels    Recommendations for Other Services       Precautions / Restrictions Precautions Precautions: Fall Restrictions Weight Bearing Restrictions: No    Mobility  Bed Mobility Overal bed mobility: Needs Assistance Bed Mobility: Supine to Sit;Sit to Supine     Supine to sit: Supervision Sit to supine: Supervision   General bed mobility comments: Supervision for safety. Increased time required.    Transfers Overall transfer level: Needs assistance Equipment used: Rolling walker (2 wheeled) Transfers: Sit to/from Stand Sit to Stand: Min guard         General transfer comment: Min guard for safety. Cues for safe hand placement. Increased time required.  Ambulation/Gait Ambulation/Gait assistance: Min guard Gait Distance (Feet): 20 Feet Assistive device: Rolling walker (2 wheeled);1 person hand held assist Gait Pattern/deviations: Step-through pattern;Decreased stride length;Trunk flexed Gait velocity: Decreased   General Gait Details: Min guard for safety. Distance limited secondary to fatigue. Cues for upright posture and proximity to device.   Stairs              Wheelchair Mobility    Modified Rankin (Stroke Patients Only)       Balance Overall balance assessment: Needs assistance Sitting-balance support: No upper extremity supported;Feet supported Sitting balance-Leahy Scale: Good     Standing balance support: Bilateral upper extremity supported;During functional activity Standing balance-Leahy Scale: Poor Standing balance comment: Reliant on BUE support                            Cognition Arousal/Alertness: Awake/alert Behavior During Therapy: WFL for tasks assessed/performed Overall Cognitive Status: Within Functional Limits for tasks assessed                                        Exercises General Exercises - Lower Extremity Ankle Circles/Pumps: AROM;Both;10 reps;Supine Heel Slides: AROM;Left;10 reps (Could not tolerate on the R.)    General Comments        Pertinent Vitals/Pain Pain Assessment: Faces Faces Pain Scale: Hurts little more Pain Location: R knee Pain Descriptors / Indicators: Guarding Pain Intervention(s): Monitored during session;Limited activity within patient's tolerance;Repositioned    Home Living                      Prior Function            PT Goals (current goals can now be found in the care plan section) Acute Rehab PT Goals Patient Stated Goal: to go home PT Goal Formulation: With patient Time For Goal Achievement: 03/16/21 Potential to Achieve Goals: Good Progress towards PT goals: Progressing toward goals  Frequency    Min 3X/week      PT Plan Current plan remains appropriate    Co-evaluation              AM-PAC PT "6 Clicks" Mobility   Outcome Measure  Help needed turning from your back to your side while in a flat bed without using bedrails?: None Help needed moving from lying on your back to sitting on the side of a flat bed without using bedrails?: None Help needed moving to and from a bed to a chair (including a  wheelchair)?: A Little Help needed standing up from a chair using your arms (e.g., wheelchair or bedside chair)?: A Little Help needed to walk in hospital room?: A Little Help needed climbing 3-5 steps with a railing? : A Lot 6 Click Score: 19    End of Session Equipment Utilized During Treatment: Oxygen Activity Tolerance: Patient limited by fatigue Patient left: in bed;with call bell/phone within reach;with bed alarm set Nurse Communication: Mobility status PT Visit Diagnosis: Unsteadiness on feet (R26.81);Muscle weakness (generalized) (M62.81);Pain Pain - Right/Left: Right Pain - part of body: Knee     Time: 1246-1301 PT Time Calculation (min) (ACUTE ONLY): 15 min  Charges:  $Therapeutic Activity: 8-22 mins                     Cindee Salt, DPT  Acute Rehabilitation Services  Pager: [phone] Office: [phone]    Lehman Prom 03/03/2021, 3:03 PM

## 2021-03-03 NOTE — Progress Notes (Addendum)
Patient ID: CARINA CHAPLIN, female   DOB: [birth year], 65 y.o.   MRN: [id number]  PROGRESS NOTE    JENILYN MAGANA  GQQ:[id number] DOB: Jun 16, 1956 DOA: 02/28/2021 PCP: Rodrigo Ran, MD   Brief Narrative:  66 y.o. female with medical history significant for COPD, chronic bilateral lower extremity venous stasis, gout, hypercholesteremia, who presented to [facility] ED with complaints of shortness of breath and productive cough progressively worsening in the past week.  On presentation, her oxygen saturation was 88% on room air.  CTA chest was negative for PE but showed small area of groundglass tree-in-bud opacity in the perihilar right upper lobe likely pneumonitis.  She was started on IV Solu-Medrol.  Assessment & Plan:   Acute hypoxic respiratory failure COPD exacerbation -Still on 4 L oxygen via nasal cannula.  Wean off as able - CTA chest was negative for PE but showed small area of groundglass tree-in-bud opacity in the perihilar right upper lobe likely pneumonitis.   -COVID-19 and influenza testing negative on presentation -Decrease Solu-Medrol to 60 mg IV every 12 hours.  Continue nebs and Dulera.  Continue Zithromax  Chronic bilateral lower extremity venous insufficiency -Follows up with Dr. Lajoyce Corners as an outpatient.  Continue local skin care  Hypothyroidism -Continue levothyroxine  Hyperlipidemia -Continue Lipitor  GERD -Continue PPI  Polyneuropathy -Continue gabapentin  Anxiety/depression -Continue home regimen  General deconditioning -PT recommends home and PT.    DVT prophylaxis: Subcutaneous Lovenox Code Status: Full Family Communication:  Disposition Plan: Status is: Inpatient because: Inpatient level of care appropriate due to severity of illness  Dispo:  Patient From: Home  Planned Disposition: Home in 1 to 2 days if clinically improves  Medically stable for discharge: No       Consultants: None Procedures: None  Antimicrobials:  Zithromax   Subjective: Patient seen and examined at bedside.  Very poor historian.  Still short of breath with minimal exertion.  No chest pain or vomiting reported.  No overnight fevers.   Objective: Vitals:   03/02/21 2344 03/03/21 0132 03/03/21 0349 03/03/21 0743  BP: (!) 97/48  115/88   Pulse: 60  67   Resp: 18     Temp: 98.5 F (36.9 C)  98.2 F (36.8 C)   TempSrc: Oral  Oral   SpO2: 90%  93% 93%  Weight:  89.9 kg    Height:       No intake or output data in the 24 hours ending 03/03/21 0927  Filed Weights   03/01/21 1610 03/02/21 0159 03/03/21 0132  Weight: 89.9 kg 89.9 kg 89.9 kg    Examination:  General exam: Looks chronically ill and deconditioned.  No distress.  Still on 4 L oxygen by nasal cannula.   respiratory system: Bilateral decreased breath sounds at bases with scattered crackles cardiovascular system: Rate controlled, S1-S2 heard gastrointestinal system: Abdomen is distended mildly, soft and nontender.  Normal bowel sounds are heard  extremities: Bilateral lower extremity edema present with chronic skin changes.  No cyanosis  Central nervous system: Alert.  Still very slow to respond to questions.  Poor historian. No focal neurological deficits.  Moves extremities  skin: No obvious ecchymosis/rashes Psychiatry: Extremely flat affect.  Hardly participates in any conversation.  Data Reviewed: I have personally reviewed following labs and imaging studies  CBC: Recent Labs  Lab 02/28/21 1325 02/28/21 2333 03/01/21 0331  WBC 9.0 8.1 7.3  NEUTROABS 6.4  --   --   HGB 12.3 11.5* 11.5*  HCT 41.7 37.8  38.7  MCV 87.1 85.3 86.4  PLT 391 361 384    Basic Metabolic Panel: Recent Labs  Lab 02/28/21 1325 02/28/21 2333 03/01/21 0331  NA 138  --  139  K 4.1  --  3.7  CL 101  --  101  CO2 27  --  27  GLUCOSE 91  --  129*  BUN <5*  --  <5*  CREATININE 0.90 0.79 0.81  CALCIUM 8.3*  --  8.6*  MG  --   --  2.5*  PHOS  --   --  5.1*    GFR: Estimated  Creatinine Clearance: 76.2 mL/min (by C-G formula based on SCr of 0.81 mg/dL). Liver Function Tests: Recent Labs  Lab 02/28/21 1325  AST 14*  ALT 10  ALKPHOS 108  BILITOT 0.6  PROT 7.6  ALBUMIN 3.1*    No results for input(s): LIPASE, AMYLASE in the last 168 hours. No results for input(s): AMMONIA in the last 168 hours. Coagulation Profile: Recent Labs  Lab 02/28/21 1325  INR 1.1    Cardiac Enzymes: No results for input(s): CKTOTAL, CKMB, CKMBINDEX, TROPONINI in the last 168 hours. BNP (last 3 results) No results for input(s): PROBNP in the last 8760 hours. HbA1C: No results for input(s): HGBA1C in the last 72 hours. CBG: No results for input(s): GLUCAP in the last 168 hours. Lipid Profile: No results for input(s): CHOL, HDL, LDLCALC, TRIG, CHOLHDL, LDLDIRECT in the last 72 hours. Thyroid Function Tests: No results for input(s): TSH, T4TOTAL, FREET4, T3FREE, THYROIDAB in the last 72 hours. Anemia Panel: No results for input(s): VITAMINB12, FOLATE, FERRITIN, TIBC, IRON, RETICCTPCT in the last 72 hours. Sepsis Labs: Recent Labs  Lab 02/28/21 1325 02/28/21 1925  LATICACIDVEN 1.2 1.0     Recent Results (from the past 240 hour(s))  Blood culture (routine x 2)     Status: None (Preliminary result)   Collection Time: 02/28/21  1:25 PM   Specimen: BLOOD  Result Value Ref Range Status   Specimen Description BLOOD SITE NOT SPECIFIED  Final   Special Requests   Final    BOTTLES DRAWN AEROBIC AND ANAEROBIC Blood Culture adequate volume   Culture   Final    NO GROWTH 2 DAYS Performed at [facility], [address], [place], [place] [address]    Report Status PENDING  Incomplete  Resp Panel by RT-PCR (Flu A&B, Covid) Nasopharyngeal Swab     Status: None   Collection Time: 02/28/21  4:38 PM   Specimen: Nasopharyngeal Swab; Nasopharyngeal(NP) swabs in vial transport medium  Result Value Ref Range Status   SARS Coronavirus 2 by RT PCR NEGATIVE NEGATIVE Final     Comment: (NOTE) SARS-CoV-2 target nucleic acids are NOT DETECTED.  The SARS-CoV-2 RNA is generally detectable in upper respiratory specimens during the acute phase of infection. The lowest concentration of SARS-CoV-2 viral copies this assay can detect is 138 copies/mL. A negative result does not preclude SARS-Cov-2 infection and should not be used as the sole basis for treatment or other patient management decisions. A negative result may occur with  improper specimen collection/handling, submission of specimen other than nasopharyngeal swab, presence of viral mutation(s) within the areas targeted by this assay, and inadequate number of viral copies(<138 copies/mL). A negative result must be combined with clinical observations, patient history, and epidemiological information. The expected result is Negative.  Fact Sheet for Patients:  BloggerCourse.com  Fact Sheet for Healthcare Providers:  SeriousBroker.it  This test is no t  yet approved or cleared by the [place] and  has been authorized for detection and/or diagnosis of SARS-CoV-2 by FDA under an Emergency Use Authorization (EUA). This EUA will remain  in effect (meaning this test can be used) for the duration of the COVID-19 declaration under Section 564(b)(1) of the Act, 21 U.S.C.section 360bbb-3(b)(1), unless the authorization is terminated  or revoked sooner.       Influenza A by PCR NEGATIVE NEGATIVE Final   Influenza B by PCR NEGATIVE NEGATIVE Final    Comment: (NOTE) The Xpert Xpress SARS-CoV-2/FLU/RSV plus assay is intended as an aid in the diagnosis of influenza from Nasopharyngeal swab specimens and should not be used as a sole basis for treatment. Nasal washings and aspirates are unacceptable for Xpert Xpress SARS-CoV-2/FLU/RSV testing.  Fact Sheet for Patients: BloggerCourse.com  Fact Sheet for Healthcare  Providers: SeriousBroker.it  This test is not yet approved or cleared by the [place] FDA and has been authorized for detection and/or diagnosis of SARS-CoV-2 by FDA under an Emergency Use Authorization (EUA). This EUA will remain in effect (meaning this test can be used) for the duration of the COVID-19 declaration under Section 564(b)(1) of the Act, 21 U.S.C. section 360bbb-3(b)(1), unless the authorization is terminated or revoked.  Performed at [facility], [address] [place], [place] [address]   Blood culture (routine x 2)     Status: None (Preliminary result)   Collection Time: 02/28/21  5:32 PM   Specimen: BLOOD  Result Value Ref Range Status   Specimen Description BLOOD LEFT ANTECUBITAL  Final   Special Requests   Final    BOTTLES DRAWN AEROBIC AND ANAEROBIC Blood Culture adequate volume   Culture   Final    NO GROWTH 2 DAYS Performed at [facility], [address], [place], [place] [address]    Report Status PENDING  Incomplete          Radiology Studies: No results found.      Scheduled Meds:  atorvastatin  10 mg Oral Daily   azithromycin  250 mg Oral Daily   buPROPion  150 mg Oral QPM   enoxaparin (LOVENOX) injection  40 mg Subcutaneous Q24H   feeding supplement  237 mL Oral BID BM   gabapentin  100 mg Oral TID   levothyroxine  125 mcg Oral Q0600   methylPREDNISolone (SOLU-MEDROL) injection  80 mg Intravenous Q12H   mometasone-formoterol  2 puff Inhalation BID   pneumococcal 23 valent vaccine  0.5 mL Intramuscular Tomorrow-1000   venlafaxine XR  150 mg Oral Daily   Continuous Infusions:        Glade Lloyd, MD [facility] 03/03/2021, 9:27 AM

## 2021-03-03 NOTE — TOC Initial Note (Signed)
Transition of Care [facility]) - Initial/Assessment Note    Patient Details  Name: [name] MRN: [id number] Date of Birth: Apr 11, 1956  Transition of [facility]) CM/SW Contact:    Joanne Chars, LCSW Phone Number: 03/03/2021, 10:08 AM  Clinical Narrative:    CSW met with pt regarding recommendation for [facility].  Pt agreeable to this, choice document given, no agency preference indicated.  Pt reports she lives alone, no family, permission given to speak with friend Berlinda Last, [phone].  Pt reports there is an Therapist, sports, Roselyn Reef, who also visits, unclear on which agency, pt thinks agency is "[employer]" but CSW unable to find an agency called this in [facility].  Pt reports no current DME in the home, she does not have home O2.  PCP in place.  She would like a walker at DC.  Pt reports she has been vaccinated for covid but not boosted.                 Expected Discharge Plan: [place] Barriers to Discharge: Continued Medical Work up, Other (must enter comment) ([place] recommendation with Medicaid payer)   Patient Goals and CMS Choice Patient states their goals for this hospitalization and ongoing recovery are:: "back to normal" CMS Medicare.gov Compare Post Acute Care list provided to:: Patient Choice offered to / list presented to : Patient  Expected Discharge Plan and Services Expected Discharge Plan: [place] Choice: [place] arrangements for the past 2 months: Mobile Home                                      Prior Living Arrangements/Services Living arrangements for the past 2 months: Mobile Home Lives with:: Self Patient language and need for interpreter reviewed:: Yes Do you feel safe going back to the place where you live?: Yes      Need for Family Participation in Patient Care: No (Comment) Care giver support system in place?: Yes (comment) (friend) Current home services: Other (comment) (RN  visits--pt unsure who is providing this service) Criminal Activity/Legal Involvement Pertinent to Current Situation/Hospitalization: No - Comment as needed  Activities of Daily Living Home Assistive Devices/Equipment: None ADL Screening (condition at time of admission) Patient's cognitive ability adequate to safely complete daily activities?: No Is the patient deaf or have difficulty hearing?: Yes Does the patient have difficulty seeing, even when wearing glasses/contacts?: Yes Does the patient have difficulty concentrating, remembering, or making decisions?: Yes Patient able to express need for assistance with ADLs?: Yes Does the patient have difficulty dressing or bathing?: No Independently performs ADLs?: Yes (appropriate for developmental age) Does the patient have difficulty walking or climbing stairs?: Yes Weakness of Legs: Both Weakness of Arms/Hands: Both  Permission Sought/Granted Permission sought to share information with : Other (comment) Permission granted to share information with : Yes, Verbal Permission Granted  Share Information with NAME: friend, Berlinda Last, [phone].  Permission granted to share info w AGENCY: [facility]        Emotional Assessment Appearance:: Appears stated age Attitude/Demeanor/Rapport: Engaged Affect (typically observed): Appropriate, Pleasant Orientation: :  (not recorded) Alcohol / Substance Use: Not Applicable Psych Involvement: No (comment)  Admission diagnosis:  Shortness of breath [R06.02] Pneumonitis [J18.9] Acute exacerbation of chronic obstructive pulmonary disease (COPD) ([place]) [J44.1] Hypoxia [R09.02] COPD exacerbation ([place]) [J44.1] Patient Active Problem  List   Diagnosis Date Noted   COPD exacerbation ([place]) 03/01/2021   Acute exacerbation of chronic obstructive pulmonary disease (COPD) ([place]) 02/28/2021   Pyelonephritis 04/04/2018   Depression 04/04/2018   Venous insufficiency (chronic) (peripheral) 01/11/2018   BRONCHITIS,  ACUTE 10/21/2008   Essential hypertension 07/13/2007   Asthma 07/11/2007   Esophageal reflux 07/11/2007   SLEEP APNEA 07/11/2007   SHORTNESS OF BREATH 07/11/2007   COUGH 07/11/2007   PCP:  Crist Infante, MD Pharmacy:   [facility], [place] [address] [place] [address] Phone: [phone] Fax: [place] [id number] [place], [place] [address] [place] [address] Phone: [phone] Fax: [phone]     Social Determinants of Health (SDOH) Interventions    Readmission Risk Interventions No flowsheet data found.

## 2021-03-04 ENCOUNTER — Inpatient Hospital Stay (HOSPITAL_COMMUNITY): Payer: Medicaid Other

## 2021-03-04 DIAGNOSIS — J9601 Acute respiratory failure with hypoxia: Secondary | ICD-10-CM

## 2021-03-04 DIAGNOSIS — F32A Depression, unspecified: Secondary | ICD-10-CM | POA: Diagnosis not present

## 2021-03-04 DIAGNOSIS — R2231 Localized swelling, mass and lump, right upper limb: Secondary | ICD-10-CM | POA: Diagnosis not present

## 2021-03-04 DIAGNOSIS — J441 Chronic obstructive pulmonary disease with (acute) exacerbation: Secondary | ICD-10-CM | POA: Diagnosis not present

## 2021-03-04 DIAGNOSIS — I872 Venous insufficiency (chronic) (peripheral): Secondary | ICD-10-CM

## 2021-03-04 LAB — BASIC METABOLIC PANEL
Anion gap: 12 (ref 5–15)
BUN: 19 mg/dL (ref 8–23)
CO2: 31 mmol/L (ref 22–32)
Calcium: 9.2 mg/dL (ref 8.9–10.3)
Chloride: 94 mmol/L — ABNORMAL LOW (ref 98–111)
Creatinine, Ser: 0.98 mg/dL (ref 0.44–1.00)
GFR, Estimated: >60 mL/min (ref >60)
Glucose, Bld: 195 mg/dL — ABNORMAL HIGH (ref 70–99)
Potassium: 4.4 mmol/L (ref 3.5–5.1)
Sodium: 137 mmol/L (ref 135–145)

## 2021-03-04 LAB — MAGNESIUM: Magnesium: 2.5 mg/dL — ABNORMAL HIGH (ref 1.7–2.4)

## 2021-03-04 MED ORDER — FLUCONAZOLE 150 MG PO TABS
150.0000 mg | ORAL_TABLET | Freq: Once | ORAL | Status: AC
  Administered 2021-03-04: 150 mg via ORAL
  Filled 2021-03-04: qty 1

## 2021-03-04 NOTE — Plan of Care (Signed)
°  Problem: Education: °Goal: Knowledge of disease or condition will improve °Outcome: Progressing °Goal: Knowledge of the prescribed therapeutic regimen will improve °Outcome: Progressing °Goal: Individualized Educational Video(s) °Outcome: Progressing °  °

## 2021-03-04 NOTE — Progress Notes (Signed)
Oxygen saturation during ambulation:  Patient Saturations on Room Air at Rest = 89%  Patient Saturations on Room Air while Ambulating = 86%  Patient Saturations on 2 Liters of oxygen while Ambulating = 92%

## 2021-03-04 NOTE — Progress Notes (Signed)
PROGRESS NOTE    [name]  JGO:[id number] DOB: 1955/11/09 DOA: 02/28/2021 PCP: Rodrigo Ran, MD   Brief Narrative: [name] is a 65 y.o. female with a history of chronic lower extremity venous stasis, GERD, hypercholesterolemia, COPD. Patient presented secondary to shortness of breath.  Patient shortness of breath.  Patient resented secondary to dyspnea while initial CT chest was negative for no pulmonary embolism but this suggests bronchitis.  Patient was started on treatment for COPD exacerbation with associated hypoxia requiring supplemental oxygen.   Assessment & Plan:   Principal Problem:   Acute respiratory failure with hypoxia (HCC) Active Problems:   Venous insufficiency (chronic) (peripheral)   Depression   Acute exacerbation of chronic obstructive pulmonary disease (COPD) (HCC)   COPD exacerbation (HCC)   Arm mass, right   Acute respiratory failure with hypoxia In setting of COPD exacerbation. Patient requiring oxygen supplementation -Wean oxygen to room air -Continuous pulse ox -Ambulatory pulse oximetry  COPD exacerbation Treated with solu-medrol, Azithromycin, Duonebs, Dulera. Improved. -Transition to prednisone to complete burst therapy -Continue Azithromycin -Continue Duonebs and Dulera  Right arm mass Unsure of etiology. Mobile. Tender. Insidious onset. Does not appear likely to be detached muscle. -Right arm ultrasound  Bilateral LE venous insufficiency Chronic.  Hyperlipidemia -Continue Lipitor  GERD -Continue Protonix  Polyneuropathy -Continue gabapentin  Hyperlipidemia -Continue Lipitor  Anxiety Depression -Continue Wellbutrin, Effexor-XR  Vaginal itching Possible yeast infection -Empiric fluconazole   DVT prophylaxis: Lovenox Code Status:   Code Status: Full Code Family Communication: None at bedside Disposition Plan: Discharge home likely in 1 days pending ability to wean oxygen, evaluation of right arm  mass   Consultants:  None  Procedures:  None  Antimicrobials: Azithromycin    Subjective: Right arm mass noted that has been bothering her. Mass has been present for the past few weeks.  Objective: Vitals:   03/03/21 1456 03/03/21 1928 03/03/21 2115 03/04/21 0417  BP: 113/69  (!) 105/52   Pulse: 84  78   Resp: 15  17   Temp: 98.3 F (36.8 C)  98.1 F (36.7 C)   TempSrc:   Oral   SpO2: 90% 92% 93%   Weight:    89.9 kg  Height:        Intake/Output Summary (Last 24 hours) at 03/04/2021 0556 Last data filed at 03/03/2021 1501 Gross per 24 hour  Intake 120 ml  Output --  Net 120 ml   Filed Weights   03/02/21 0159 03/03/21 0132 03/04/21 0417  Weight: 89.9 kg 89.9 kg 89.9 kg    Examination:  General exam: Appears calm and comfortable Respiratory system: Clear to auscultation except significantly diminished breath sounds in RLL. Respiratory effort normal. Cardiovascular system: S1 & S2 heard, RRR. No murmurs, rubs, gallops or clicks. Gastrointestinal system: Abdomen is nondistended, soft and nontender. No organomegaly or masses felt. Normal bowel sounds heard. Central nervous system: Alert and oriented. No focal neurological deficits. Musculoskeletal: No edema. No calf tenderness. Right arm with moderately sized mobile mass that is slightly tender Skin: No cyanosis. No rashes Psychiatry: Judgement and insight appear normal. Mood & affect appropriate.     Data Reviewed: I have personally reviewed following labs and imaging studies  CBC Lab Results  Component Value Date   WBC 7.3 03/01/2021   RBC 4.48 03/01/2021   HGB 11.5 (L) 03/01/2021   HCT 38.7 03/01/2021   MCV 86.4 03/01/2021   MCH 25.7 (L) 03/01/2021   PLT 384 03/01/2021   MCHC  29.7 (L) 03/01/2021   RDW 19.4 (H) 03/01/2021   LYMPHSABS 1.9 02/28/2021   MONOABS 0.5 02/28/2021   EOSABS 0.1 02/28/2021   BASOSABS 0.1 02/28/2021     Last metabolic panel Lab Results  Component Value Date   NA 139  03/01/2021   K 3.7 03/01/2021   CL 101 03/01/2021   CO2 27 03/01/2021   BUN <5 (L) 03/01/2021   CREATININE 0.81 03/01/2021   GLUCOSE 129 (H) 03/01/2021   GFRNONAA >60 03/01/2021   GFRAA >60 04/06/2018   CALCIUM 8.6 (L) 03/01/2021   PHOS 5.1 (H) 03/01/2021   PROT 7.6 02/28/2021   ALBUMIN 3.1 (L) 02/28/2021   BILITOT 0.6 02/28/2021   ALKPHOS 108 02/28/2021   AST 14 (L) 02/28/2021   ALT 10 02/28/2021   ANIONGAP 11 03/01/2021    CBG (last 3)  No results for input(s): GLUCAP in the last 72 hours.   GFR: Estimated Creatinine Clearance: 76.2 mL/min (by C-G formula based on SCr of 0.81 mg/dL).  Coagulation Profile: Recent Labs  Lab 02/28/21 1325  INR 1.1    Recent Results (from the past 240 hour(s))  Blood culture (routine x 2)     Status: None (Preliminary result)   Collection Time: 02/28/21  1:25 PM   Specimen: BLOOD  Result Value Ref Range Status   Specimen Description BLOOD SITE NOT SPECIFIED  Final   Special Requests   Final    BOTTLES DRAWN AEROBIC AND ANAEROBIC Blood Culture adequate volume   Culture   Final    NO GROWTH 3 DAYS Performed at [facility], [address], [place], [place] [address]    Report Status PENDING  Incomplete  Resp Panel by RT-PCR (Flu A&B, Covid) Nasopharyngeal Swab     Status: None   Collection Time: 02/28/21  4:38 PM   Specimen: Nasopharyngeal Swab; Nasopharyngeal(NP) swabs in vial transport medium  Result Value Ref Range Status   SARS Coronavirus 2 by RT PCR NEGATIVE NEGATIVE Final    Comment: (NOTE) SARS-CoV-2 target nucleic acids are NOT DETECTED.  The SARS-CoV-2 RNA is generally detectable in upper respiratory specimens during the acute phase of infection. The lowest concentration of SARS-CoV-2 viral copies this assay can detect is 138 copies/mL. A negative result does not preclude SARS-Cov-2 infection and should not be used as the sole basis for treatment or other patient management decisions. A negative result may occur  with  improper specimen collection/handling, submission of specimen other than nasopharyngeal swab, presence of viral mutation(s) within the areas targeted by this assay, and inadequate number of viral copies(<138 copies/mL). A negative result must be combined with clinical observations, patient history, and epidemiological information. The expected result is Negative.  Fact Sheet for Patients:  BloggerCourse.com  Fact Sheet for Healthcare Providers:  SeriousBroker.it  This test is no t yet approved or cleared by the [place] FDA and  has been authorized for detection and/or diagnosis of SARS-CoV-2 by FDA under an Emergency Use Authorization (EUA). This EUA will remain  in effect (meaning this test can be used) for the duration of the COVID-19 declaration under Section 564(b)(1) of the Act, 21 U.S.C.section 360bbb-3(b)(1), unless the authorization is terminated  or revoked sooner.       Influenza A by PCR NEGATIVE NEGATIVE Final   Influenza B by PCR NEGATIVE NEGATIVE Final    Comment: (NOTE) The Xpert Xpress SARS-CoV-2/FLU/RSV plus assay is intended as an aid in the diagnosis of influenza from Nasopharyngeal swab specimens and should  not be used as a sole basis for treatment. Nasal washings and aspirates are unacceptable for Xpert Xpress SARS-CoV-2/FLU/RSV testing.  Fact Sheet for Patients: BloggerCourse.com  Fact Sheet for Healthcare Providers: SeriousBroker.it  This test is not yet approved or cleared by the [place] FDA and has been authorized for detection and/or diagnosis of SARS-CoV-2 by FDA under an Emergency Use Authorization (EUA). This EUA will remain in effect (meaning this test can be used) for the duration of the COVID-19 declaration under Section 564(b)(1) of the Act, 21 U.S.C. section 360bbb-3(b)(1), unless the authorization is terminated  or revoked.  Performed at [facility], [address], [place], [place] [address]   Blood culture (routine x 2)     Status: None (Preliminary result)   Collection Time: 02/28/21  5:32 PM   Specimen: BLOOD  Result Value Ref Range Status   Specimen Description BLOOD LEFT ANTECUBITAL  Final   Special Requests   Final    BOTTLES DRAWN AEROBIC AND ANAEROBIC Blood Culture adequate volume   Culture   Final    NO GROWTH 3 DAYS Performed at [facility], [address], [place], [place] [address]    Report Status PENDING  Incomplete        Radiology Studies: No results found.      Scheduled Meds:  atorvastatin  10 mg Oral Daily   azithromycin  250 mg Oral Daily   buPROPion  150 mg Oral QPM   enoxaparin (LOVENOX) injection  40 mg Subcutaneous Q24H   feeding supplement  237 mL Oral BID BM   gabapentin  100 mg Oral TID   levothyroxine  125 mcg Oral Q0600   mometasone-formoterol  2 puff Inhalation BID   pantoprazole  40 mg Oral Daily   pneumococcal 23 valent vaccine  0.5 mL Intramuscular Tomorrow-1000   venlafaxine XR  150 mg Oral Daily   Continuous Infusions:   LOS: 3 days     Jacquelin Hawking, MD [facility] 03/04/2021, 5:56 AM  If 7PM-7AM, please contact night-coverage [email or web address]

## 2021-03-05 DIAGNOSIS — J9601 Acute respiratory failure with hypoxia: Secondary | ICD-10-CM | POA: Diagnosis not present

## 2021-03-05 LAB — CULTURE, BLOOD (ROUTINE X 2)
Culture: NO GROWTH
Culture: NO GROWTH
Special Requests: ADEQUATE
Special Requests: ADEQUATE

## 2021-03-05 NOTE — Discharge Summary (Signed)
Physician Discharge Summary  [name] WJX:[id number] DOB: 1955-11-02 DOA: 02/28/2021  PCP: Rodrigo RanPerini, Mark, MD  Admit date: 02/28/2021 Discharge date: 03/06/2021  Admitted From: Home Disposition: Home  Recommendations for Outpatient Follow-up:  Follow up with PCP in 1 week Follow up with [facility] Please obtain BMP/CBC in one week Please follow up on the following pending results: None  Home Health: Home health PT/OT Equipment/Devices: Rolling walker, oxygen  Discharge Condition: Stable CODE STATUS: Full code Diet recommendation: Regular diet   Brief/Interim Summary:  Admission HPI written by Glade LloydKshitiz Alekh, MD  HPI: [name] is a 65 y.o. female with medical history significant for COPD, chronic bilateral lower extremity venous stasis, gout, hypercholesteremia, who presented [facility] ED with complaints of shortness of breath and productive cough progressively worsening in the past week.  Associated with wheezing.  She quit smoking about a month ago.  Has been smoking since the age of 65.  She also reports bowel incontinence for the past week, no recent use of antibiotics, no nausea or vomiting or abdominal pain.  No fevers.  Upon presentation to the ED O2 saturation 88% on room air.  She received IV Solu-Medrol 125 mg x 1, albuterol and Atrovent inhalers and then DuoNebs with mild improvement of symptomatology.  Patient not on oxygen supplementation at baseline.  Due to concern for possible pulmonary embolism a CTA chest was ordered by EDP.  It returned negative for PE but with findings suggestive of central bronchial thickening, small area of groundglass tree in bud opacity in the perihilar right upper lobe likely pneumonitis.  Coronary artery calcifications.  Patient admitted under hospitalist service.   Hospital course:  Acute respiratory failure with hypoxia In setting of COPD exacerbation. Patient requiring oxygen supplementation weaned to 2 L/min.  Discharged with home oxygen and home health PT.   COPD exacerbation Treated with solu-medrol, Azithromycin, Duonebs, Dulera. Improved. Completed steroids and azithromycin. Continue home Duoneb and Symbicort.   Right arm mass Tender. Insidious onset. Does not appear likely to be detached muscle. Right arm ultrasound non-specific. 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 Discussed with oncology who recommended referral to [facility] at [facility] for continued management. Discussed with patient who is in agreement.   Bilateral LE venous insufficiency Chronic. Stable.   Hyperlipidemia Continue Lipitor   GERD Continue Dexilant   Polyneuropathy Continue gabapentin   Anxiety Depression Continue Wellbutrin, Effexor-XR   Vaginal itching Possible yeast infection. Treated with fluconazole 150 mg PO x1  Discharge Diagnoses:  Principal Problem:   Acute respiratory failure with hypoxia (HCC) Active Problems:   Venous insufficiency (chronic) (peripheral)   Depression   Acute exacerbation of chronic obstructive pulmonary disease (COPD) (HCC)   COPD exacerbation (HCC)   Arm mass, right    Discharge Instructions   Allergies as of 03/06/2021   No Known Allergies      Medication List     TAKE these medications    ALPRAZolam 1 MG tablet Commonly known as: XANAX Take 1 mg by mouth 3 (three) times daily.   atorvastatin 10 MG tablet Commonly known as: LIPITOR Take 10 mg by mouth daily.   buPROPion 150 MG 24 hr tablet Commonly known as: WELLBUTRIN XL Take 150 mg by mouth every evening.   Dexilant 60 MG capsule Generic drug: dexlansoprazole Take 60 mg by mouth daily.   gabapentin 300 MG capsule Commonly known as: NEURONTIN Take 300 mg by mouth 3 (three) times daily.  ipratropium-albuterol 0.5-2.5 (3) MG/3ML Soln Commonly known as: DUONEB use 1 vial by nebulization every 4 (four) hours as needed.   levothyroxine 150 MCG tablet Commonly known  as: SYNTHROID Take 150 mcg by mouth daily before breakfast.   nitroGLYCERIN 0.4 MG SL tablet Commonly known as: NITROSTAT Place 0.4 mg under the tongue every 5 (five) minutes as needed for chest pain.   oxyCODONE-acetaminophen 5-325 MG tablet Commonly known as: PERCOCET/ROXICET Take 1 tablet by mouth in the morning, at noon, and at bedtime.   Symbicort 160-4.5 MCG/ACT inhaler Generic drug: budesonide-formoterol Inhale 2 puffs into the lungs 2 (two) times daily.   venlafaxine XR 150 MG 24 hr capsule Commonly known as: EFFEXOR-XR Take 150 mg by mouth at bedtime.   VITAMIN B-12 PO Take 1 tablet by mouth daily.   VITAMIN D PO Take 1 tablet by mouth daily.        Follow-up Information     Rodrigo Ran, MD. Schedule an appointment as soon as possible for a visit in 1 week(s).   Specialty: Internal Medicine Why: For hospital follow-up Contact information: [address] [place] [address] [phone]         [facility] Follow up.   Specialty: Home Health Services Why: [facility] will contact you to set up your first home visit. Contact information: [address] [place] [address] [phone]         [facility] [employer] Follow up.   Why: This company, now called [employer], will provide your home oxygen.  Please call them if you have any problems. Contact information: [address] [place] [address] [phone]         Josefine Class, MD. Schedule an appointment as soon as possible for a visit in 1 week(s).   Specialty: Orthopedic Surgery Why: right arm mass concerning for cancer Contact information: [address] [place] [address] [phone]                No Known Allergies  Consultations: None   Procedures/Studies: DG Chest 2 View  Result Date: 03/04/2021 CLINICAL DATA:  Decreased breath sounds R06.89 (ICD-10-CM) Decreased breath sounds at right lung base R06.89 (ICD-10-CM)  EXAM: CHEST - 2 VIEW COMPARISON:  February 28, 2021 FINDINGS: Similar cardiomediastinal silhouette. Streaky bibasilar opacities. No visible pleural effusions or pneumothorax. Degenerative changes of the thoracic spine with osteopenia. IMPRESSION: 1. Streaky bibasilar opacities, most likely atelectasis. 2. Area of suspected pneumonitis seen on recent CT chest was previously occult by radiograph. Electronically Signed   By: Feliberto Harts MD   On: 03/04/2021 13:37   DG Chest 2 View  Result Date: 02/28/2021 CLINICAL DATA:  Cough and shortness of breath. EXAM: CHEST - 2 VIEW COMPARISON:  02/16/2009 FINDINGS: Midline trachea. Normal heart size and mediastinal contours. No pleural effusion or pneumothorax. Clear lungs. Moderate thoracic spondylosis. Degenerative changes of both shoulders. IMPRESSION: No acute cardiopulmonary disease. Electronically Signed   By: Jeronimo Greaves M.D.   On: 02/28/2021 14:11   CT Angio Chest PE W and/or Wo Contrast  Result Date: 02/28/2021 CLINICAL DATA:  PE suspected, high prob Shortness of breath. EXAM: CT ANGIOGRAPHY CHEST WITH CONTRAST TECHNIQUE: Multidetector CT imaging of the chest was performed using the standard protocol during bolus administration of intravenous contrast. Multiplanar CT image reconstructions and MIPs were obtained to evaluate the vascular anatomy. CONTRAST:  50mL OMNIPAQUE IOHEXOL 350 MG/ML SOLN COMPARISON:  Radiograph earlier today. FINDINGS: Cardiovascular: There are no filling  defects within the pulmonary arteries to suggest pulmonary embolus. Thoracic aorta is normal in caliber. Mild aortic atherosclerosis. No aortic dissection. The heart is normal in size. There are coronary artery calcifications. No pericardial effusion. Mediastinum/Nodes: There are calcified mediastinal lymph nodes consistent with prior granulomatous disease. No noncalcified adenopathy. Esophagus mildly patulous without wall thickening. No thyroid nodule. Lungs/Pleura: Central bronchial  thickening. Minimal retained mucus in the right mainstem bronchus. Small area of ground-glass and tree in bud opacity in the perihilar right upper lobe, series 7, image 62, likely pneumonitis. Lingular atelectasis. No confluent consolidation. No pleural fluid. No findings of pulmonary edema. No pulmonary mass or suspicious nodule. Upper Abdomen: No acute findings.  Cholecystectomy. Musculoskeletal: Diffuse thoracic spondylosis. Bilateral glenohumeral osteoarthritis, right greater than left. There are no acute or suspicious osseous abnormalities. Review of the MIP images confirms the above findings. IMPRESSION: 1. No pulmonary embolus. 2. Central bronchial thickening. Small area of ground-glass and tree in bud opacity in the perihilar right upper lobe, likely pneumonitis. 3. Coronary artery calcifications. Aortic Atherosclerosis (ICD10-I70.0). Electronically Signed   By: Narda Rutherford M.D.   On: 02/28/2021 21:52   MR HUMERUS RIGHT W WO CONTRAST  Result Date: 03/06/2021 CLINICAL DATA:  Soft tissue mass, upper arm, US/xray nondiagnostic EXAM: MRI OF THE RIGHT HUMERUS WITHOUT AND WITH CONTRAST TECHNIQUE: Multiplanar, multisequence MR imaging of the right humerus was performed before and after the administration of intravenous contrast. CONTRAST:  7.66mL GADAVIST GADOBUTROL 1 MMOL/ML IV SOLN COMPARISON:  Ultrasound 03/04/2021 FINDINGS: Bones/Joint/Cartilage The cortex is intact. There is no significant marrow signal alteration Muscles and Tendons There is generalized loss of muscle bulk in the upper extremity and chest wall musculature. Within the biceps muscle at the level of the mid to distal humerus, there is a fat containing mass measuring 4.1 x 2.1 cm in the axial dimension and 9.0 cm in craniocaudal extent (axial T1 image 31, sagittal image 12). This loses signal on STIR and fat saturated imaging. There are mild thin septations which could be intervening muscle fibers. There is no nodular enhancement. The  medial margin abuts the neurovascular bundle but there is likely no encasement. Soft tissues No additional findings. IMPRESSION: Fat containing mass within the biceps muscle at the level of the mid to distal humerus measuring 4.1 x 2.1 cm in the axial dimension and 9.0 cm in craniocaudal extent. 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 The medial margin abuts the neurovascular bundle but there is likely no encasement. Referral to orthopedic oncology is recommended for further management. Electronically Signed   By: Caprice Renshaw   On: 03/06/2021 12:57   [place] RT UPPER EXTREM LTD SOFT TISSUE NON VASCULAR  Result Date: 03/04/2021 CLINICAL DATA:  Mass in the right arm with tenderness. EXAM: ULTRASOUND right UPPER EXTREMITY LIMITED TECHNIQUE: Ultrasound examination of the upper extremity soft tissues was performed in the area of clinical concern. COMPARISON:  None. FINDINGS: Accentuated echogenicity in the region of concern along the biceps muscle is identified. Adjacent or internal vascularity noted. I cannot exclude hematoma or mass. Definitive assessment with MRI is recommended. IMPRESSION: Echogenic region in the biceps muscle is nonspecific but could reflect hematoma or mass. MRI of the upper arm with and without contrast with attention to the region of concern is recommended for further characterization. Electronically Signed   By: Gaylyn Rong M.D.   On: 03/04/2021 13:49   VAS [place] LOWER EXTREMITY VENOUS (DVT) (ONLY [facility] &  [facility] 7a-7p)  Result Date: 03/02/2021  Lower Venous DVT Study Patient Name:  [name]  Date of Exam:   02/28/2021 Medical Rec #: [id number]      Accession #:    [id number] Date of Birth: 1956-04-12      Patient Gender: F Patient Age:   69Y Exam Location:  [facility] Procedure:      VAS [place] LOWER EXTREMITY VENOUS (DVT) Referring Phys: [id number] SOPHIA CACCAVALE  --------------------------------------------------------------------------------  Indications: Pain, Swelling, and Erythema.  Limitations: Edema, skin texture. Comparison Study: Prior negative right LEV done 11/10/13 Performing Technologist: Sherren Kerns RVS  Examination Guidelines: A complete evaluation includes B-mode imaging, spectral Doppler, color Doppler, and power Doppler as needed of all accessible portions of each vessel. Bilateral testing is considered an integral part of a complete examination. Limited examinations for reoccurring indications may be performed as noted. The reflux portion of the exam is performed with the patient in reverse Trendelenburg.  +---------+---------------+---------+-----------+----------+-------------------+ RIGHT    CompressibilityPhasicitySpontaneityPropertiesThrombus Aging      +---------+---------------+---------+-----------+----------+-------------------+ CFV      Full           Yes      Yes                                      +---------+---------------+---------+-----------+----------+-------------------+ SFJ      Full                                                             +---------+---------------+---------+-----------+----------+-------------------+ FV Prox  Full                                                             +---------+---------------+---------+-----------+----------+-------------------+ FV Mid   Full                                                             +---------+---------------+---------+-----------+----------+-------------------+ FV DistalFull                                                             +---------+---------------+---------+-----------+----------+-------------------+ PFV      Full                                                             +---------+---------------+---------+-----------+----------+-------------------+ POP      Full           Yes      Yes                                       +---------+---------------+---------+-----------+----------+-------------------+  PTV                                                   Not well visualized +---------+---------------+---------+-----------+----------+-------------------+ PERO                                                  Not well visualized +---------+---------------+---------+-----------+----------+-------------------+   +---------+---------------+---------+-----------+----------+--------------+ LEFT     CompressibilityPhasicitySpontaneityPropertiesThrombus Aging +---------+---------------+---------+-----------+----------+--------------+ CFV      Full                                                        +---------+---------------+---------+-----------+----------+--------------+ SFJ      Full                                                        +---------+---------------+---------+-----------+----------+--------------+ FV Prox  Full                                                        +---------+---------------+---------+-----------+----------+--------------+ FV Mid   Full                                                        +---------+---------------+---------+-----------+----------+--------------+ FV DistalFull                                                        +---------+---------------+---------+-----------+----------+--------------+ PFV      Full                                                        +---------+---------------+---------+-----------+----------+--------------+ POP      Full           Yes      Yes                                 +---------+---------------+---------+-----------+----------+--------------+ PTV      Full                                                        +---------+---------------+---------+-----------+----------+--------------+  PERO     Full                                                         +---------+---------------+---------+-----------+----------+--------------+     Summary: RIGHT: - There is no evidence of deep vein thrombosis in the lower extremity. However, portions of this examination were limited- see technologist comments above.  LEFT: - There is no evidence of deep vein thrombosis in the lower extremity.  *See table(s) above for measurements and observations. Electronically signed by Fabienne Bruns MD on 03/02/2021 at 3:26:53 PM.    Final       Subjective: No dyspnea. Still with some right arm pain from the arm mass she has.  Discharge Exam: Vitals:   03/06/21 0717 03/06/21 0723  BP:    Pulse:    Resp:    Temp:    SpO2: 97% 96%   Vitals:   03/05/21 2140 03/06/21 0527 03/06/21 0717 03/06/21 0723  BP: (!) 96/55 (!) 99/54    Pulse: 88 66    Resp: 18 18    Temp: 98.2 F (36.8 C) 97.6 F (36.4 C)    TempSrc: Oral Oral    SpO2: 92% 96% 97% 96%  Weight:  90.2 kg    Height:        General exam: Appears calm and comfortable Respiratory system: Diminished but no wheezing. Respiratory effort normal. Cardiovascular system: S1 & S2 heard, RRR. No murmurs, rubs, gallops or clicks. Gastrointestinal system: Abdomen is nondistended, soft and nontender. No organomegaly or masses felt. Normal bowel sounds heard. Central nervous system: Alert and oriented. No focal neurological deficits. Musculoskeletal: No edema. No calf tenderness. Right distal ventral arm mass that is tender Skin: No cyanosis. No rashes Psychiatry: Judgement and insight appear normal. Mood & affect appropriate.     The results of significant diagnostics from this hospitalization (including imaging, microbiology, ancillary and laboratory) are listed below for reference.     Microbiology: Recent Results (from the past 240 hour(s))  Blood culture (routine x 2)     Status: None   Collection Time: 02/28/21  1:25 PM   Specimen: BLOOD  Result Value Ref Range Status   Specimen Description BLOOD SITE NOT  SPECIFIED  Final   Special Requests   Final    BOTTLES DRAWN AEROBIC AND ANAEROBIC Blood Culture adequate volume   Culture   Final    NO GROWTH 5 DAYS Performed at [facility], [address], [place], [place] [address]    Report Status 03/05/2021 FINAL  Final  Resp Panel by RT-PCR (Flu A&B, Covid) Nasopharyngeal Swab     Status: None   Collection Time: 02/28/21  4:38 PM   Specimen: Nasopharyngeal Swab; Nasopharyngeal(NP) swabs in vial transport medium  Result Value Ref Range Status   SARS Coronavirus 2 by RT PCR NEGATIVE NEGATIVE Final    Comment: (NOTE) SARS-CoV-2 target nucleic acids are NOT DETECTED.  The SARS-CoV-2 RNA is generally detectable in upper respiratory specimens during the acute phase of infection. The lowest concentration of SARS-CoV-2 viral copies this assay can detect is 138 copies/mL. A negative result does not preclude SARS-Cov-2 infection and should not be used as the sole basis for treatment or other patient management decisions. A negative result may occur with  improper specimen collection/handling, submission of  specimen other than nasopharyngeal swab, presence of viral mutation(s) within the areas targeted by this assay, and inadequate number of viral copies(<138 copies/mL). A negative result must be combined with clinical observations, patient history, and epidemiological information. The expected result is Negative.  Fact Sheet for Patients:  BloggerCourse.com  Fact Sheet for Healthcare Providers:  SeriousBroker.it  This test is no t yet approved or cleared by the [place] FDA and  has been authorized for detection and/or diagnosis of SARS-CoV-2 by FDA under an Emergency Use Authorization (EUA). This EUA will remain  in effect (meaning this test can be used) for the duration of the COVID-19 declaration under Section 564(b)(1) of the Act, 21 U.S.C.section 360bbb-3(b)(1), unless the  authorization is terminated  or revoked sooner.       Influenza A by PCR NEGATIVE NEGATIVE Final   Influenza B by PCR NEGATIVE NEGATIVE Final    Comment: (NOTE) The Xpert Xpress SARS-CoV-2/FLU/RSV plus assay is intended as an aid in the diagnosis of influenza from Nasopharyngeal swab specimens and should not be used as a sole basis for treatment. Nasal washings and aspirates are unacceptable for Xpert Xpress SARS-CoV-2/FLU/RSV testing.  Fact Sheet for Patients: BloggerCourse.com  Fact Sheet for Healthcare Providers: SeriousBroker.it  This test is not yet approved or cleared by the [place] FDA and has been authorized for detection and/or diagnosis of SARS-CoV-2 by FDA under an Emergency Use Authorization (EUA). This EUA will remain in effect (meaning this test can be used) for the duration of the COVID-19 declaration under Section 564(b)(1) of the Act, 21 U.S.C. section 360bbb-3(b)(1), unless the authorization is terminated or revoked.  Performed at [facility], [address] [place], [place] [address]   Blood culture (routine x 2)     Status: None   Collection Time: 02/28/21  5:32 PM   Specimen: BLOOD  Result Value Ref Range Status   Specimen Description BLOOD LEFT ANTECUBITAL  Final   Special Requests   Final    BOTTLES DRAWN AEROBIC AND ANAEROBIC Blood Culture adequate volume   Culture   Final    NO GROWTH 5 DAYS Performed at [facility], [address], [place], [place] [address]    Report Status 03/05/2021 FINAL  Final     Labs: BNP (last 3 results) Recent Labs    02/28/21 1325  BNP 58.9   Basic Metabolic Panel: Recent Labs  Lab 02/28/21 1325 02/28/21 2333 03/01/21 0331 03/04/21 0525  NA 138  --  139 137  K 4.1  --  3.7 4.4  CL 101  --  101 94*  CO2 27  --  27 31  GLUCOSE 91  --  129* 195*  BUN <5*  --  <5* 19  CREATININE 0.90 0.79 0.81 0.98  CALCIUM 8.3*  --  8.6* 9.2  MG  --    --  2.5* 2.5*  PHOS  --   --  5.1*  --    Liver Function Tests: Recent Labs  Lab 02/28/21 1325  AST 14*  ALT 10  ALKPHOS 108  BILITOT 0.6  PROT 7.6  ALBUMIN 3.1*   No results for input(s): LIPASE, AMYLASE in the last 168 hours. No results for input(s): AMMONIA in the last 168 hours. CBC: Recent Labs  Lab 02/28/21 1325 02/28/21 2333 03/01/21 0331  WBC 9.0 8.1 7.3  NEUTROABS 6.4  --   --   HGB 12.3 11.5* 11.5*  HCT 41.7 37.8 38.7  MCV 87.1 85.3  86.4  PLT 391 361 384   Cardiac Enzymes: No results for input(s): CKTOTAL, CKMB, CKMBINDEX, TROPONINI in the last 168 hours. BNP: Invalid input(s): POCBNP CBG: No results for input(s): GLUCAP in the last 168 hours. D-Dimer No results for input(s): DDIMER in the last 72 hours. Hgb A1c No results for input(s): HGBA1C in the last 72 hours. Lipid Profile No results for input(s): CHOL, HDL, LDLCALC, TRIG, CHOLHDL, LDLDIRECT in the last 72 hours. Thyroid function studies No results for input(s): TSH, T4TOTAL, T3FREE, THYROIDAB in the last 72 hours.  Invalid input(s): FREET3 Anemia work up No results for input(s): VITAMINB12, FOLATE, FERRITIN, TIBC, IRON, RETICCTPCT in the last 72 hours. Urinalysis    Component Value Date/Time   COLORURINE YELLOW 04/04/2018 1439   APPEARANCEUR HAZY (A) 04/04/2018 1439   LABSPEC 1.002 (L) 04/04/2018 1439   PHURINE 8.0 04/04/2018 1439   GLUCOSEU NEGATIVE 04/04/2018 1439   HGBUR MODERATE (A) 04/04/2018 1439   BILIRUBINUR NEGATIVE 04/04/2018 1439   KETONESUR NEGATIVE 04/04/2018 1439   PROTEINUR NEGATIVE 04/04/2018 1439   UROBILINOGEN 0.2 04/14/2011 1651   NITRITE NEGATIVE 04/04/2018 1439   LEUKOCYTESUR SMALL (A) 04/04/2018 1439   Sepsis Labs Invalid input(s): PROCALCITONIN,  WBC,  LACTICIDVEN Microbiology Recent Results (from the past 240 hour(s))  Blood culture (routine x 2)     Status: None   Collection Time: 02/28/21  1:25 PM   Specimen: BLOOD  Result Value Ref Range Status    Specimen Description BLOOD SITE NOT SPECIFIED  Final   Special Requests   Final    BOTTLES DRAWN AEROBIC AND ANAEROBIC Blood Culture adequate volume   Culture   Final    NO GROWTH 5 DAYS Performed at [facility], [address] [place], [place] [address]    Report Status 03/05/2021 FINAL  Final  Resp Panel by RT-PCR (Flu A&B, Covid) Nasopharyngeal Swab     Status: None   Collection Time: 02/28/21  4:38 PM   Specimen: Nasopharyngeal Swab; Nasopharyngeal(NP) swabs in vial transport medium  Result Value Ref Range Status   SARS Coronavirus 2 by RT PCR NEGATIVE NEGATIVE Final    Comment: (NOTE) SARS-CoV-2 target nucleic acids are NOT DETECTED.  The SARS-CoV-2 RNA is generally detectable in upper respiratory specimens during the acute phase of infection. The lowest concentration of SARS-CoV-2 viral copies this assay can detect is 138 copies/mL. A negative result does not preclude SARS-Cov-2 infection and should not be used as the sole basis for treatment or other patient management decisions. A negative result may occur with  improper specimen collection/handling, submission of specimen other than nasopharyngeal swab, presence of viral mutation(s) within the areas targeted by this assay, and inadequate number of viral copies(<138 copies/mL). A negative result must be combined with clinical observations, patient history, and epidemiological information. The expected result is Negative.  Fact Sheet for Patients:  BloggerCourse.com  Fact Sheet for Healthcare Providers:  SeriousBroker.it  This test is no t yet approved or cleared by the [place] FDA and  has been authorized for detection and/or diagnosis of SARS-CoV-2 by FDA under an Emergency Use Authorization (EUA). This EUA will remain  in effect (meaning this test can be used) for the duration of the COVID-19 declaration under Section 564(b)(1) of the Act,  21 U.S.C.section 360bbb-3(b)(1), unless the authorization is terminated  or revoked sooner.       Influenza A by PCR NEGATIVE NEGATIVE Final   Influenza B by PCR NEGATIVE NEGATIVE Final    Comment: (  NOTE) The Xpert Xpress SARS-CoV-2/FLU/RSV plus assay is intended as an aid in the diagnosis of influenza from Nasopharyngeal swab specimens and should not be used as a sole basis for treatment. Nasal washings and aspirates are unacceptable for Xpert Xpress SARS-CoV-2/FLU/RSV testing.  Fact Sheet for Patients: BloggerCourse.com  Fact Sheet for Healthcare Providers: SeriousBroker.it  This test is not yet approved or cleared by the [place] FDA and has been authorized for detection and/or diagnosis of SARS-CoV-2 by FDA under an Emergency Use Authorization (EUA). This EUA will remain in effect (meaning this test can be used) for the duration of the COVID-19 declaration under Section 564(b)(1) of the Act, 21 U.S.C. section 360bbb-3(b)(1), unless the authorization is terminated or revoked.  Performed at [facility], [address] [place], [place] [address]   Blood culture (routine x 2)     Status: None   Collection Time: 02/28/21  5:32 PM   Specimen: BLOOD  Result Value Ref Range Status   Specimen Description BLOOD LEFT ANTECUBITAL  Final   Special Requests   Final    BOTTLES DRAWN AEROBIC AND ANAEROBIC Blood Culture adequate volume   Culture   Final    NO GROWTH 5 DAYS Performed at [facility], [address] [place], [place] [address]    Report Status 03/05/2021 FINAL  Final     Time coordinating discharge: 35 minutes  SIGNED:   Jacquelin Hawking, MD [facility] 03/06/2021, 1:14 PM

## 2021-03-05 NOTE — Progress Notes (Signed)
Physical Therapy Treatment Patient Details Name: [name] MRN: [id number] DOB: 1955-08-09 Today's Date: 03/05/2021    History of Present Illness 65 yo female admitted 7/30 secondary to SOB and cough.  Thought to be secondary to COPD exacerbation. PMHx:  LE venous insufficiency, hypothyroidism, polyneuorpathy, depression, anxiety    PT Comments    Patient continues to require O2 for activity (dropping to 84% on room air with ambulation). With 2L O2 maintains saturation at 90%. Patient able to maneuver RW herself but with increased effort (she is used to using a rollator that turns much more easily than RW). Minguard assist for safety due to effort/difficulty with turns.      Follow Up Recommendations  Home health PT;Supervision for mobility/OOB     Equipment Recommendations  Rolling walker with 5" wheels    Recommendations for Other Services       Precautions / Restrictions Precautions Precautions: Fall Precaution Comments: monitor O2 and pulse Restrictions Other Position/Activity Restrictions: on 2L O2 with HFNC    Mobility  Bed Mobility Overal bed mobility: Modified Independent Bed Mobility: Supine to Sit;Sit to Supine     Supine to sit: Modified independent (Device/Increase time) Sit to supine: Modified independent (Device/Increase time)   General bed mobility comments: HOb 30 degrees    Transfers Overall transfer level: Needs assistance Equipment used: Rolling walker (2 wheeled) Transfers: Sit to/from Stand Sit to Stand: Supervision         General transfer comment: supervision for cues for proper hand placement  Ambulation/Gait Ambulation/Gait assistance: Min guard Gait Distance (Feet): 75 Feet Assistive device: Rolling walker (2 wheeled);1 person hand held assist Gait Pattern/deviations: Step-through pattern;Decreased stride length;Trunk flexed Gait velocity: Decreased   General Gait Details: Min guard for safety. Cues for upright posture and  proximity to device (pt is used to using rollator with seat and therefore walks "behind" RW).   Stairs             Wheelchair Mobility    Modified Rankin (Stroke Patients Only)       Balance Overall balance assessment: Needs assistance Sitting-balance support: No upper extremity supported;Feet supported Sitting balance-Leahy Scale: Good     Standing balance support: Bilateral upper extremity supported;During functional activity Standing balance-Leahy Scale: Poor Standing balance comment: Reliant on BUE support                            Cognition Arousal/Alertness: Awake/alert Behavior During Therapy: WFL for tasks assessed/performed Overall Cognitive Status: Within Functional Limits for tasks assessed                                        Exercises      General Comments General comments (skin integrity, edema, etc.): see ambulatory O2 sat note      Pertinent Vitals/Pain Pain Assessment: No/denies pain    Home Living                      Prior Function            PT Goals (current goals can now be found in the care plan section) Acute Rehab PT Goals Patient Stated Goal: to go home Time For Goal Achievement: 03/16/21 Potential to Achieve Goals: Good Progress towards PT goals: Progressing toward goals    Frequency    Min 3X/week  PT Plan Current plan remains appropriate    Co-evaluation              AM-PAC PT "6 Clicks" Mobility   Outcome Measure  Help needed turning from your back to your side while in a flat bed without using bedrails?: None Help needed moving from lying on your back to sitting on the side of a flat bed without using bedrails?: None Help needed moving to and from a bed to a chair (including a wheelchair)?: A Little Help needed standing up from a chair using your arms (e.g., wheelchair or bedside chair)?: A Little Help needed to walk in hospital room?: A Little Help needed  climbing 3-5 steps with a railing? : A Lot 6 Click Score: 19    End of Session Equipment Utilized During Treatment: Oxygen Activity Tolerance: Patient tolerated treatment well Patient left: in bed;with call bell/phone within reach Nurse Communication: Mobility status;Other (comment) (decr sats with ambulation) PT Visit Diagnosis: Unsteadiness on feet (R26.81);Muscle weakness (generalized) (M62.81);Pain     Time: 1131-1144 PT Time Calculation (min) (ACUTE ONLY): 13 min  Charges:  $Gait Training: 8-22 mins                      Jerolyn Center, PT Pager [phone]    Zena Amos 03/05/2021, 12:16 PM

## 2021-03-05 NOTE — Plan of Care (Signed)

## 2021-03-05 NOTE — Progress Notes (Signed)
SATURATION QUALIFICATIONS: (This note is used to comply with regulatory documentation for home oxygen)  Patient Saturations on Room Air at Rest = 89%  Patient Saturations on Room Air while Ambulating = 84%  Patient Saturations on 2 Liters of oxygen while Ambulating = 90%  Please briefly explain why patient needs home oxygen:  To maintain oxygen saturation >87% during functional activities.   Jerolyn Center, PT Pager [phone]

## 2021-03-05 NOTE — Progress Notes (Signed)
   03/05/21 1135  Clinical Encounter Type  Visited With Patient  Visit Type Initial;Spiritual support  Referral From Nurse  Consult/Referral To Chaplain   Chaplain responded to the consult request for an Advance Directive. The chaplain did education on AD. The patient said she did not request and turned down. The patient asked for prayer. She asked for the willpower and courage to give up smoking. This note was prepared by Deneen Harts, M.Div..  For questions please contact by phone [phone].

## 2021-03-05 NOTE — Plan of Care (Signed)

## 2021-03-05 NOTE — Progress Notes (Signed)
PROGRESS NOTE    [name]  IRJ:[id number] DOB: 1956/04/16 DOA: 02/28/2021 PCP: Rodrigo Ran, MD   Brief Narrative: [name] is a 65 y.o. female with a history of chronic lower extremity venous stasis, GERD, hypercholesterolemia, COPD. Patient presented secondary to shortness of breath.  Patient shortness of breath.  Patient resented secondary to dyspnea while initial CT chest was negative for no pulmonary embolism but this suggests bronchitis.  Patient was started on treatment for COPD exacerbation with associated hypoxia requiring supplemental oxygen.   Assessment & Plan:   Principal Problem:   Acute respiratory failure with hypoxia (HCC) Active Problems:   Venous insufficiency (chronic) (peripheral)   Depression   Acute exacerbation of chronic obstructive pulmonary disease (COPD) (HCC)   COPD exacerbation (HCC)   Arm mass, right   Acute respiratory failure with hypoxia In setting of COPD exacerbation. Patient requiring oxygen supplementation -Wean oxygen to room air -Continuous pulse ox -Ambulatory pulse oximetry  COPD exacerbation Treated with solu-medrol, Azithromycin, Duonebs, Dulera. Improved. Completed treatment. -Continue Duonebs and Dulera  Right arm mass Unsure of etiology. Mobile. Tender. Insidious onset. Does not appear likely to be detached muscle. Right arm mass on ultrasound was indeterminate with recommendation for MRI -MRI right arm  Bilateral LE venous insufficiency Chronic.  Hyperlipidemia -Continue Lipitor  GERD -Continue Protonix  Polyneuropathy -Continue gabapentin  Hyperlipidemia -Continue Lipitor  Anxiety Depression -Continue Wellbutrin, Effexor-XR  Vaginal itching Possible yeast infection. Treated with fluconazole   DVT prophylaxis: Lovenox Code Status:   Code Status: Full Code Family Communication: None at bedside Disposition Plan: Discharge home pending MRI and subsequent results   Consultants:   None  Procedures:  None  Antimicrobials: Azithromycin    Subjective: Breathing is okay. Right arm mass is painful.  Objective: Vitals:   03/05/21 0358 03/05/21 0712 03/05/21 0717 03/05/21 1329  BP:    (!) 97/46  Pulse:    73  Resp:    16  Temp:    98.3 F (36.8 C)  TempSrc:    Oral  SpO2:  93% 94% 100%  Weight: 89.9 kg     Height:        Intake/Output Summary (Last 24 hours) at 03/05/2021 1722 Last data filed at 03/05/2021 1445 Gross per 24 hour  Intake --  Output 900 ml  Net -900 ml    Filed Weights   03/03/21 0132 03/04/21 0417 03/05/21 0358  Weight: 89.9 kg 89.9 kg 89.9 kg    Examination:  General exam: Appears calm and comfortable  Respiratory system: Clear to auscultation. Respiratory effort normal. Cardiovascular system: S1 & S2 heard, RRR. No murmurs, rubs, gallops or clicks. Gastrointestinal system: Abdomen is nondistended, soft and nontender. No organomegaly or masses felt. Normal bowel sounds heard. Central nervous system: Alert and oriented. No focal neurological deficits. Musculoskeletal: No edema. No calf tenderness. Right arm mass is located on ventral distal upper arm and is tender and minimally mobile. Skin: No cyanosis. No rashes Psychiatry: Judgement and insight appear normal. Mood & affect appropriate.     Data Reviewed: I have personally reviewed following labs and imaging studies  CBC Lab Results  Component Value Date   WBC 7.3 03/01/2021   RBC 4.48 03/01/2021   HGB 11.5 (L) 03/01/2021   HCT 38.7 03/01/2021   MCV 86.4 03/01/2021   MCH 25.7 (L) 03/01/2021   PLT 384 03/01/2021   MCHC 29.7 (L) 03/01/2021   RDW 19.4 (H) 03/01/2021   LYMPHSABS 1.9 02/28/2021   MONOABS  0.5 02/28/2021   EOSABS 0.1 02/28/2021   BASOSABS 0.1 02/28/2021     Last metabolic panel Lab Results  Component Value Date   NA 137 03/04/2021   K 4.4 03/04/2021   CL 94 (L) 03/04/2021   CO2 31 03/04/2021   BUN 19 03/04/2021   CREATININE 0.98 03/04/2021    GLUCOSE 195 (H) 03/04/2021   GFRNONAA >60 03/04/2021   GFRAA >60 04/06/2018   CALCIUM 9.2 03/04/2021   PHOS 5.1 (H) 03/01/2021   PROT 7.6 02/28/2021   ALBUMIN 3.1 (L) 02/28/2021   BILITOT 0.6 02/28/2021   ALKPHOS 108 02/28/2021   AST 14 (L) 02/28/2021   ALT 10 02/28/2021   ANIONGAP 12 03/04/2021    CBG (last 3)  No results for input(s): GLUCAP in the last 72 hours.   GFR: Estimated Creatinine Clearance: 63 mL/min (by C-G formula based on SCr of 0.98 mg/dL).  Coagulation Profile: Recent Labs  Lab 02/28/21 1325  INR 1.1     Recent Results (from the past 240 hour(s))  Blood culture (routine x 2)     Status: None   Collection Time: 02/28/21  1:25 PM   Specimen: BLOOD  Result Value Ref Range Status   Specimen Description BLOOD SITE NOT SPECIFIED  Final   Special Requests   Final    BOTTLES DRAWN AEROBIC AND ANAEROBIC Blood Culture adequate volume   Culture   Final    NO GROWTH 5 DAYS Performed at [facility], [address] [place], [place] [address]    Report Status 03/05/2021 FINAL  Final  Resp Panel by RT-PCR (Flu A&B, Covid) Nasopharyngeal Swab     Status: None   Collection Time: 02/28/21  4:38 PM   Specimen: Nasopharyngeal Swab; Nasopharyngeal(NP) swabs in vial transport medium  Result Value Ref Range Status   SARS Coronavirus 2 by RT PCR NEGATIVE NEGATIVE Final    Comment: (NOTE) SARS-CoV-2 target nucleic acids are NOT DETECTED.  The SARS-CoV-2 RNA is generally detectable in upper respiratory specimens during the acute phase of infection. The lowest concentration of SARS-CoV-2 viral copies this assay can detect is 138 copies/mL. A negative result does not preclude SARS-Cov-2 infection and should not be used as the sole basis for treatment or other patient management decisions. A negative result may occur with  improper specimen collection/handling, submission of specimen other than nasopharyngeal swab, presence of viral mutation(s) within the areas  targeted by this assay, and inadequate number of viral copies(<138 copies/mL). A negative result must be combined with clinical observations, patient history, and epidemiological information. The expected result is Negative.  Fact Sheet for Patients:  BloggerCourse.com  Fact Sheet for Healthcare Providers:  SeriousBroker.it  This test is no t yet approved or cleared by the [place] FDA and  has been authorized for detection and/or diagnosis of SARS-CoV-2 by FDA under an Emergency Use Authorization (EUA). This EUA will remain  in effect (meaning this test can be used) for the duration of the COVID-19 declaration under Section 564(b)(1) of the Act, 21 U.S.C.section 360bbb-3(b)(1), unless the authorization is terminated  or revoked sooner.       Influenza A by PCR NEGATIVE NEGATIVE Final   Influenza B by PCR NEGATIVE NEGATIVE Final    Comment: (NOTE) The Xpert Xpress SARS-CoV-2/FLU/RSV plus assay is intended as an aid in the diagnosis of influenza from Nasopharyngeal swab specimens and should not be used as a sole basis for treatment. Nasal washings and aspirates are unacceptable for Xpert Xpress  SARS-CoV-2/FLU/RSV testing.  Fact Sheet for Patients: BloggerCourse.com  Fact Sheet for Healthcare Providers: SeriousBroker.it  This test is not yet approved or cleared by the [place] FDA and has been authorized for detection and/or diagnosis of SARS-CoV-2 by FDA under an Emergency Use Authorization (EUA). This EUA will remain in effect (meaning this test can be used) for the duration of the COVID-19 declaration under Section 564(b)(1) of the Act, 21 U.S.C. section 360bbb-3(b)(1), unless the authorization is terminated or revoked.  Performed at [facility], [address] [place], [place] [address]   Blood culture (routine x 2)     Status: None   Collection Time:  02/28/21  5:32 PM   Specimen: BLOOD  Result Value Ref Range Status   Specimen Description BLOOD LEFT ANTECUBITAL  Final   Special Requests   Final    BOTTLES DRAWN AEROBIC AND ANAEROBIC Blood Culture adequate volume   Culture   Final    NO GROWTH 5 DAYS Performed at [facility], [address] [place], [place] [address]    Report Status 03/05/2021 FINAL  Final         Radiology Studies: DG Chest 2 View  Result Date: 03/04/2021 CLINICAL DATA:  Decreased breath sounds R06.89 (ICD-10-CM) Decreased breath sounds at right lung base R06.89 (ICD-10-CM) EXAM: CHEST - 2 VIEW COMPARISON:  February 28, 2021 FINDINGS: Similar cardiomediastinal silhouette. Streaky bibasilar opacities. No visible pleural effusions or pneumothorax. Degenerative changes of the thoracic spine with osteopenia. IMPRESSION: 1. Streaky bibasilar opacities, most likely atelectasis. 2. Area of suspected pneumonitis seen on recent CT chest was previously occult by radiograph. Electronically Signed   By: Feliberto Harts MD   On: 03/04/2021 13:37   [place] RT UPPER EXTREM LTD SOFT TISSUE NON VASCULAR  Result Date: 03/04/2021 CLINICAL DATA:  Mass in the right arm with tenderness. EXAM: ULTRASOUND right UPPER EXTREMITY LIMITED TECHNIQUE: Ultrasound examination of the upper extremity soft tissues was performed in the area of clinical concern. COMPARISON:  None. FINDINGS: Accentuated echogenicity in the region of concern along the biceps muscle is identified. Adjacent or internal vascularity noted. I cannot exclude hematoma or mass. Definitive assessment with MRI is recommended. IMPRESSION: Echogenic region in the biceps muscle is nonspecific but could reflect hematoma or mass. MRI of the upper arm with and without contrast with attention to the region of concern is recommended for further characterization. Electronically Signed   By: Gaylyn Rong M.D.   On: 03/04/2021 13:49        Scheduled Meds:  atorvastatin  10 mg Oral  Daily   buPROPion  150 mg Oral QPM   enoxaparin (LOVENOX) injection  40 mg Subcutaneous Q24H   feeding supplement  237 mL Oral BID BM   gabapentin  100 mg Oral TID   levothyroxine  125 mcg Oral Q0600   mometasone-formoterol  2 puff Inhalation BID   pantoprazole  40 mg Oral Daily   pneumococcal 23 valent vaccine  0.5 mL Intramuscular Tomorrow-1000   venlafaxine XR  150 mg Oral Daily   Continuous Infusions:   LOS: 4 days     Jacquelin Hawking, MD [facility] 03/05/2021, 5:22 PM  If 7PM-7AM, please contact night-coverage [email or web address]

## 2021-03-05 NOTE — TOC Transition Note (Signed)
Transition of Care [facility]) - CM/SW Discharge Note   Patient Details  Name: [name] MRN: [id number] Date of Birth: 1956/05/05  Transition of Care [facility]) CM/SW Contact:  Lorri Frederick, LCSW Phone Number: 03/05/2021, 2:59 PM   Clinical Narrative:  Pt discharging home with [facility].  [employer] will provide home O2 and rolling walker.  No other needs identified.     Final next level of care: Home w Home Health Services Barriers to Discharge: Barriers Resolved   Patient Goals and CMS Choice Patient states their goals for this hospitalization and ongoing recovery are:: "back to normal" CMS Medicare.gov Compare Post Acute Care list provided to:: Patient Choice offered to / list presented to : Patient  Discharge Placement                       Discharge Plan and Services     Post Acute Care Choice: Home Health          DME Arranged: Walker rolling, Oxygen DME Agency: [employer] Date DME Agency Contacted: 03/05/21 Time DME Agency Contacted: [phone] Representative spoke with at DME Agency: Velna Hatchet HH Arranged: PT HH Agency: [facility] Date [facility] Agency Contacted: 03/04/21 Time HH Agency Contacted: 1056 Representative spoke with at [facility] Agency: Kandee Keen  Social Determinants of Health (SDOH) Interventions     Readmission Risk Interventions No flowsheet data found.

## 2021-03-06 ENCOUNTER — Inpatient Hospital Stay (HOSPITAL_COMMUNITY): Payer: Medicaid Other

## 2021-03-06 ENCOUNTER — Other Ambulatory Visit (HOSPITAL_COMMUNITY): Payer: Self-pay

## 2021-03-06 DIAGNOSIS — J9601 Acute respiratory failure with hypoxia: Secondary | ICD-10-CM | POA: Diagnosis not present

## 2021-03-06 MED ORDER — GADOBUTROL 1 MMOL/ML IV SOLN
7.5000 mL | Freq: Once | INTRAVENOUS | Status: AC | PRN
  Administered 2021-03-06: 7.5 mL via INTRAVENOUS

## 2021-03-06 MED ORDER — IPRATROPIUM-ALBUTEROL 0.5-2.5 (3) MG/3ML IN SOLN
3.0000 mL | RESPIRATORY_TRACT | 0 refills | Status: DC | PRN
Start: 2021-03-06 — End: 2024-02-14
  Filled 2021-03-06: qty 360, 20d supply, fill #0

## 2021-03-06 MED ORDER — BISACODYL 10 MG RE SUPP
10.0000 mg | Freq: Once | RECTAL | Status: AC
  Administered 2021-03-06: 10 mg via RECTAL
  Filled 2021-03-06: qty 1

## 2021-03-06 NOTE — TOC Progression Note (Signed)
Transition of Care [facility]) - Progression Note    Patient Details  Name: RYLYNNE SCHICKER MRN: [id number] Date of Birth: 1956-01-09  Transition of Care [facility]) CM/SW Contact  Lorri Frederick, LCSW Phone Number: 03/06/2021, 1:21 PM  Clinical Narrative:   Nebulizer added to DME, [employer] notified and will deliver.     Expected Discharge Plan: Home w Home Health Services Barriers to Discharge: Barriers Resolved  Expected Discharge Plan and Services Expected Discharge Plan: Home w Home Health Services     Post Acute Care Choice: Home Health Living arrangements for the past 2 months: Mobile Home                 DME Arranged: Walker rolling, Oxygen DME Agency: [employer] Date DME Agency Contacted: 03/05/21 Time DME Agency Contacted: [phone] Representative spoke with at DME Agency: Velna Hatchet HH Arranged: PT HH Agency: [facility] Date [facility] Agency Contacted: 03/04/21 Time HH Agency Contacted: 1056 Representative spoke with at [facility] Agency: Kandee Keen   Social Determinants of Health (SDOH) Interventions    Readmission Risk Interventions No flowsheet data found.

## 2021-03-06 NOTE — Plan of Care (Signed)

## 2021-03-06 NOTE — Progress Notes (Signed)
Discharge instruction given to the patient and she stated understanding. Questions and concerns answered. VS checked and within normal limit. IV discontinued. Patient discharged in wheel chair with oxygen at this time. Patient hemodynamically stable during discharge.

## 2021-03-06 NOTE — Discharge Instructions (Signed)
[name],  You were in the hospital for treatment of a COPD exacerbation and low oxygen. You have been treated with antibiotics and steroids. While here, you had an MRI which was concerning for possible cancer of your right arm. I have given you information to follow-up with an orthopedic oncologist in [place] as we do not have any in this area; your PCP can further help.

## 2021-05-05 ENCOUNTER — Ambulatory Visit: Payer: Medicaid Other | Admitting: Sports Medicine

## 2021-05-06 ENCOUNTER — Other Ambulatory Visit (HOSPITAL_BASED_OUTPATIENT_CLINIC_OR_DEPARTMENT_OTHER): Payer: Self-pay

## 2021-06-16 ENCOUNTER — Emergency Department (HOSPITAL_COMMUNITY): Payer: Medicaid Other

## 2021-06-16 ENCOUNTER — Encounter (HOSPITAL_COMMUNITY): Payer: Self-pay | Admitting: Emergency Medicine

## 2021-06-16 ENCOUNTER — Inpatient Hospital Stay (HOSPITAL_COMMUNITY)
Admission: EM | Admit: 2021-06-16 | Discharge: 2021-06-23 | DRG: 871 | Disposition: A | Discharge status: Home / Self Care | Payer: Medicaid Other | Attending: Internal Medicine | Admitting: Internal Medicine

## 2021-06-16 ENCOUNTER — Other Ambulatory Visit: Payer: Self-pay

## 2021-06-16 DIAGNOSIS — R269 Unspecified abnormalities of gait and mobility: Secondary | ICD-10-CM | POA: Diagnosis present

## 2021-06-16 DIAGNOSIS — J9601 Acute respiratory failure with hypoxia: Secondary | ICD-10-CM | POA: Diagnosis present

## 2021-06-16 DIAGNOSIS — R627 Adult failure to thrive: Secondary | ICD-10-CM | POA: Diagnosis present

## 2021-06-16 DIAGNOSIS — J441 Chronic obstructive pulmonary disease with (acute) exacerbation: Secondary | ICD-10-CM | POA: Diagnosis present

## 2021-06-16 DIAGNOSIS — E669 Obesity, unspecified: Secondary | ICD-10-CM | POA: Diagnosis present

## 2021-06-16 DIAGNOSIS — B888 Other specified infestations: Secondary | ICD-10-CM | POA: Diagnosis present

## 2021-06-16 DIAGNOSIS — R0602 Shortness of breath: Secondary | ICD-10-CM | POA: Diagnosis present

## 2021-06-16 DIAGNOSIS — Z79890 Hormone replacement therapy (postmenopausal): Secondary | ICD-10-CM | POA: Diagnosis not present

## 2021-06-16 DIAGNOSIS — E7800 Pure hypercholesterolemia, unspecified: Secondary | ICD-10-CM | POA: Diagnosis present

## 2021-06-16 DIAGNOSIS — Z6834 Body mass index (BMI) 34.0-34.9, adult: Secondary | ICD-10-CM | POA: Diagnosis not present

## 2021-06-16 DIAGNOSIS — Z20822 Contact with and (suspected) exposure to covid-19: Secondary | ICD-10-CM | POA: Diagnosis present

## 2021-06-16 DIAGNOSIS — E872 Acidosis, unspecified: Secondary | ICD-10-CM | POA: Diagnosis present

## 2021-06-16 DIAGNOSIS — Z91199 Patient's noncompliance with other medical treatment and regimen due to unspecified reason: Secondary | ICD-10-CM

## 2021-06-16 DIAGNOSIS — A419 Sepsis, unspecified organism: Secondary | ICD-10-CM | POA: Diagnosis present

## 2021-06-16 DIAGNOSIS — E785 Hyperlipidemia, unspecified: Secondary | ICD-10-CM | POA: Diagnosis present

## 2021-06-16 DIAGNOSIS — L03115 Cellulitis of right lower limb: Secondary | ICD-10-CM | POA: Diagnosis present

## 2021-06-16 DIAGNOSIS — Z9049 Acquired absence of other specified parts of digestive tract: Secondary | ICD-10-CM

## 2021-06-16 DIAGNOSIS — I878 Other specified disorders of veins: Secondary | ICD-10-CM | POA: Diagnosis present

## 2021-06-16 DIAGNOSIS — M109 Gout, unspecified: Secondary | ICD-10-CM | POA: Diagnosis present

## 2021-06-16 DIAGNOSIS — L03116 Cellulitis of left lower limb: Secondary | ICD-10-CM | POA: Diagnosis present

## 2021-06-16 DIAGNOSIS — L89152 Pressure ulcer of sacral region, stage 2: Secondary | ICD-10-CM | POA: Diagnosis present

## 2021-06-16 DIAGNOSIS — K219 Gastro-esophageal reflux disease without esophagitis: Secondary | ICD-10-CM | POA: Diagnosis present

## 2021-06-16 DIAGNOSIS — F17210 Nicotine dependence, cigarettes, uncomplicated: Secondary | ICD-10-CM | POA: Diagnosis present

## 2021-06-16 DIAGNOSIS — J9621 Acute and chronic respiratory failure with hypoxia: Secondary | ICD-10-CM | POA: Diagnosis present

## 2021-06-16 DIAGNOSIS — L0390 Cellulitis, unspecified: Secondary | ICD-10-CM | POA: Diagnosis not present

## 2021-06-16 DIAGNOSIS — E039 Hypothyroidism, unspecified: Secondary | ICD-10-CM | POA: Diagnosis present

## 2021-06-16 DIAGNOSIS — F419 Anxiety disorder, unspecified: Secondary | ICD-10-CM | POA: Diagnosis present

## 2021-06-16 DIAGNOSIS — Y92009 Unspecified place in unspecified non-institutional (private) residence as the place of occurrence of the external cause: Secondary | ICD-10-CM

## 2021-06-16 DIAGNOSIS — W19XXXA Unspecified fall, initial encounter: Secondary | ICD-10-CM

## 2021-06-16 DIAGNOSIS — Z79899 Other long term (current) drug therapy: Secondary | ICD-10-CM

## 2021-06-16 DIAGNOSIS — Z90710 Acquired absence of both cervix and uterus: Secondary | ICD-10-CM

## 2021-06-16 DIAGNOSIS — F32A Depression, unspecified: Secondary | ICD-10-CM | POA: Diagnosis present

## 2021-06-16 DIAGNOSIS — Z7951 Long term (current) use of inhaled steroids: Secondary | ICD-10-CM

## 2021-06-16 DIAGNOSIS — L89302 Pressure ulcer of unspecified buttock, stage 2: Secondary | ICD-10-CM

## 2021-06-16 DIAGNOSIS — M549 Dorsalgia, unspecified: Secondary | ICD-10-CM

## 2021-06-16 DIAGNOSIS — I10 Essential (primary) hypertension: Secondary | ICD-10-CM | POA: Diagnosis present

## 2021-06-16 DIAGNOSIS — Z9181 History of falling: Secondary | ICD-10-CM

## 2021-06-16 LAB — CBC WITH DIFFERENTIAL/PLATELET
Abs Immature Granulocytes: 0.05 10*3/uL (ref 0.00–0.07)
Basophils Absolute: 0.1 10*3/uL (ref 0.0–0.1)
Basophils Relative: 1 %
Eosinophils Absolute: 0.2 10*3/uL (ref 0.0–0.5)
Eosinophils Relative: 2 %
HCT: 36.2 % (ref 36.0–46.0)
Hemoglobin: 10.4 g/dL — ABNORMAL LOW (ref 12.0–15.0)
Immature Granulocytes: 0 %
Lymphocytes Relative: 12 %
Lymphs Abs: 1.6 10*3/uL (ref 0.7–4.0)
MCH: 24 pg — ABNORMAL LOW (ref 26.0–34.0)
MCHC: 28.7 g/dL — ABNORMAL LOW (ref 30.0–36.0)
MCV: 83.6 fL (ref 80.0–100.0)
Monocytes Absolute: 0.7 10*3/uL (ref 0.1–1.0)
Monocytes Relative: 5 %
Neutro Abs: 10.5 10*3/uL — ABNORMAL HIGH (ref 1.7–7.7)
Neutrophils Relative %: 80 %
Platelets: 561 10*3/uL — ABNORMAL HIGH (ref 150–400)
RBC: 4.33 MIL/uL (ref 3.87–5.11)
RDW: 21.6 % — ABNORMAL HIGH (ref 11.5–15.5)
WBC: 13.1 10*3/uL — ABNORMAL HIGH (ref 4.0–10.5)
nRBC: 0 % (ref 0.0–0.2)

## 2021-06-16 LAB — APTT: aPTT: 29 seconds (ref 24–36)

## 2021-06-16 LAB — COMPREHENSIVE METABOLIC PANEL
ALT: 16 U/L (ref 0–44)
AST: 22 U/L (ref 15–41)
Albumin: 2.1 g/dL — ABNORMAL LOW (ref 3.5–5.0)
Alkaline Phosphatase: 119 U/L (ref 38–126)
Anion gap: 6 (ref 5–15)
BUN: 14 mg/dL (ref 8–23)
CO2: 32 mmol/L (ref 22–32)
Calcium: 8.3 mg/dL — ABNORMAL LOW (ref 8.9–10.3)
Chloride: 98 mmol/L (ref 98–111)
Creatinine, Ser: 0.75 mg/dL (ref 0.44–1.00)
GFR, Estimated: >60 mL/min (ref >60)
Glucose, Bld: 101 mg/dL — ABNORMAL HIGH (ref 70–99)
Potassium: 3.5 mmol/L (ref 3.5–5.1)
Sodium: 136 mmol/L (ref 135–145)
Total Bilirubin: 0.6 mg/dL (ref 0.3–1.2)
Total Protein: 7.8 g/dL (ref 6.5–8.1)

## 2021-06-16 LAB — C-REACTIVE PROTEIN: CRP: 20.6 mg/dL — ABNORMAL HIGH (ref <1.0)

## 2021-06-16 LAB — I-STAT CHEM 8, ED
BUN: 15 mg/dL (ref 8–23)
Calcium, Ion: 1.08 mmol/L — ABNORMAL LOW (ref 1.15–1.40)
Chloride: 97 mmol/L — ABNORMAL LOW (ref 98–111)
Creatinine, Ser: 0.7 mg/dL (ref 0.44–1.00)
Glucose, Bld: 102 mg/dL — ABNORMAL HIGH (ref 70–99)
HCT: 37 % (ref 36.0–46.0)
Hemoglobin: 12.6 g/dL (ref 12.0–15.0)
Potassium: 3.5 mmol/L (ref 3.5–5.1)
Sodium: 140 mmol/L (ref 135–145)
TCO2: 34 mmol/L — ABNORMAL HIGH (ref 22–32)

## 2021-06-16 LAB — PROTIME-INR
INR: 1.3 — ABNORMAL HIGH (ref 0.8–1.2)
Prothrombin Time: 15.8 seconds — ABNORMAL HIGH (ref 11.4–15.2)

## 2021-06-16 LAB — LACTIC ACID, PLASMA
Lactic Acid, Venous: 1.7 mmol/L (ref 0.5–1.9)
Lactic Acid, Venous: 2 mmol/L (ref 0.5–1.9)

## 2021-06-16 LAB — URINALYSIS, ROUTINE W REFLEX MICROSCOPIC
Bilirubin Urine: NEGATIVE
Glucose, UA: NEGATIVE mg/dL
Hgb urine dipstick: NEGATIVE
Ketones, ur: NEGATIVE mg/dL
Leukocytes,Ua: NEGATIVE
Nitrite: NEGATIVE
Protein, ur: NEGATIVE mg/dL
Specific Gravity, Urine: >1.046 — ABNORMAL HIGH (ref 1.005–1.030)
pH: 5 (ref 5.0–8.0)

## 2021-06-16 LAB — RESP PANEL BY RT-PCR (FLU A&B, COVID) ARPGX2
Influenza A by PCR: NEGATIVE
Influenza B by PCR: NEGATIVE
SARS Coronavirus 2 by RT PCR: NEGATIVE

## 2021-06-16 LAB — LIPASE, BLOOD: Lipase: 25 U/L (ref 11–51)

## 2021-06-16 LAB — BRAIN NATRIURETIC PEPTIDE: B Natriuretic Peptide: 48.7 pg/mL (ref 0.0–100.0)

## 2021-06-16 MED ORDER — IOHEXOL 350 MG/ML SOLN
100.0000 mL | Freq: Once | INTRAVENOUS | Status: AC | PRN
  Administered 2021-06-16: 100 mL via INTRAVENOUS

## 2021-06-16 MED ORDER — MORPHINE SULFATE (PF) 2 MG/ML IV SOLN
2.0000 mg | INTRAVENOUS | Status: DC | PRN
  Administered 2021-06-16 – 2021-06-20 (×5): 2 mg via INTRAVENOUS
  Filled 2021-06-16 (×5): qty 1

## 2021-06-16 MED ORDER — SODIUM CHLORIDE 0.9 % IV BOLUS
1000.0000 mL | Freq: Once | INTRAVENOUS | Status: AC
  Administered 2021-06-16: 1000 mL via INTRAVENOUS

## 2021-06-16 MED ORDER — SODIUM CHLORIDE (PF) 0.9 % IJ SOLN
INTRAMUSCULAR | Status: AC
  Filled 2021-06-16: qty 50

## 2021-06-16 MED ORDER — ACETAMINOPHEN 325 MG PO TABS
650.0000 mg | ORAL_TABLET | Freq: Four times a day (QID) | ORAL | Status: DC | PRN
  Administered 2021-06-18 – 2021-06-20 (×2): 650 mg via ORAL
  Filled 2021-06-16 (×2): qty 2

## 2021-06-16 MED ORDER — ONDANSETRON HCL 4 MG PO TABS
4.0000 mg | ORAL_TABLET | Freq: Four times a day (QID) | ORAL | Status: DC | PRN
  Administered 2021-06-23: 4 mg via ORAL
  Filled 2021-06-16: qty 1

## 2021-06-16 MED ORDER — OXYCODONE HCL 5 MG PO TABS
5.0000 mg | ORAL_TABLET | ORAL | Status: DC | PRN
  Administered 2021-06-16 – 2021-06-23 (×16): 5 mg via ORAL
  Filled 2021-06-16 (×16): qty 1

## 2021-06-16 MED ORDER — PERMETHRIN 1 % EX LOTN
TOPICAL_LOTION | Freq: Once | CUTANEOUS | Status: DC
  Filled 2021-06-16: qty 59

## 2021-06-16 MED ORDER — ACETAMINOPHEN 650 MG RE SUPP: 650.0000 mg | Freq: Four times a day (QID) | RECTAL | Status: DC | PRN

## 2021-06-16 MED ORDER — MORPHINE SULFATE (PF) 2 MG/ML IV SOLN
2.0000 mg | Freq: Once | INTRAVENOUS | Status: AC
  Administered 2021-06-16: 2 mg via INTRAVENOUS
  Filled 2021-06-16: qty 1

## 2021-06-16 MED ORDER — ONDANSETRON HCL 4 MG/2ML IJ SOLN
4.0000 mg | Freq: Four times a day (QID) | INTRAMUSCULAR | Status: DC | PRN
  Administered 2021-06-16: 4 mg via INTRAVENOUS
  Filled 2021-06-16: qty 2

## 2021-06-16 NOTE — H&P (Addendum)
History and Physical    [name] XNA:[id number] DOB: 02/03/1956 DOA: 06/16/2021  PCP: Crist Infante, MD  Patient coming from: Home via EMS  I have personally briefly reviewed patient's old medical records in [facility]  Chief Complaint: Shortness of breath, fall, lower extremity wounds  HPI: [name] is a 65 y.o. female with medical history significant of COPD, depression/anxiety, GERD, hypothyroidism, chronic lower extremity edema/wounds, hyperlipidemia who presented to [facility] ED on 11/15 via EMS after fall at home.  Patient was found by neighbors.  Patient reports fell on the floor on the way back from the bathroom and crawled to the couch, reports on the floor for roughly 30 minutes.  Has been feeling more weak with progressive shortness of breath over the last few days.  Utilizes a walker at baseline.  Also with worsening lower extremity edema and wounds; follows with orthopedics, Dr. Sharol Given outpatient.  Denies any recent medication changes, no sick contacts.  On EMS arrival, patient was apparently found in her home that was reportedly to be unlivable infested with Beg bugs.  Patient reports she has a Therapist, sports come to the house 1-2 times a week to help with cooking and cleaning; but she manages all of her medications on her own.  ED Course: Temperature 96.5 F, HR 95, RR 16, BP 108/65, SPO2 1% on 2 L nasal cannula.  WBC 13.1, hemoglobin 10.4, platelets 561.  Sodium 140, potassium 3.5, chloride 97, CO2 2032, BUN 15, creatinine 0.70, glucose 102.  BNP 48.7.  Lactic acid 1.7> 2.0.  Chest x-ray with no acute cardiopulmonary disease process.  Left foot x-ray with no acute osseous finding or evidence of osteomyelitis, nonspecific soft tissue swelling without evidence of foreign body.  Right foot with no acute bony findings.  Left tibia/fibula with edematous appearance of the soft tissues with no gas, no radiographic evidence of osteomyelitis.  Right tibia/tibia x-ray with no acute osseous findings  or evidence of vasculitis, nonspecific generalized soft tissue edema without foreign body, advanced right knee osteoarthritis.  CT head without contrast with mild cerebral atrophy, no acute intracranial abnormality.  CT C-spine with moderate severity degenerative changes cervical spine without evidence of acute fracture or subluxation.  CT abdomen/pelvis with no evidence of acute or active process within the abdomen or pelvis.  CT T-spine with no acute osseous normality in the thoracic spine.  Cultures x2 were drawn.  EDP administered 2 mg IV x1.  Hospital service consulted for further evaluation and management of acute hypoxic respite failure secondary to COPD exacerbation, falls, lower extremity cellulitis, and adult failure to thrive in the setting of poor living conditions.  Review of Systems:  Constitutional - + Fatigue, No Weight Loss Vision - No impaired vision, decreased visual acuity Ear/Nose/Mouth/Throat - No decreased hearing, no congestion Respiratory - + shortness of breath, no exertional dyspnea, + cough Cardiovascular - No chest pain, no palpitations, + peripheral edema Gastrointestinal - No nausea, no diarrhea, no constipation, Genitourinary - No excessive urination, no urinary incontinence Integumentary - +  skin lesions Neurologic - No numbness, no tingling, no dizziness, no headaches, no confusion or memory loss   Past Medical History:  Diagnosis Date   COPD (chronic obstructive pulmonary disease) (HCC)    Gout    Hypercholesteremia     Past Surgical History:  Procedure Laterality Date   ABDOMINAL HYSTERECTOMY     ROTATOR CUFF REPAIR       reports that she has been smoking cigarettes. She has  a 30.00 pack-year smoking history. She has never used smokeless tobacco. She reports that she does not drink alcohol and does not use drugs.  No Known Allergies  No family history on file.  Family history reviewed and not pertinent   Prior to Admission medications    Medication Sig Start Date End Date Taking? Authorizing Provider  ALPRAZolam Duanne Moron) 1 MG tablet Take 1 mg by mouth 3 (three) times daily. 03/22/18   [provider]  atorvastatin (LIPITOR) 10 MG tablet Take 10 mg by mouth daily.  10/10/17   [provider]  buPROPion (WELLBUTRIN XL) 150 MG 24 hr tablet Take 150 mg by mouth every evening.     [provider]  Cyanocobalamin (VITAMIN B-12 PO) Take 1 tablet by mouth daily.    [provider]  DEXILANT 60 MG capsule Take 60 mg by mouth daily. 12/27/17   [provider]  gabapentin (NEURONTIN) 300 MG capsule Take 300 mg by mouth 3 (three) times daily.    [provider]  ipratropium-albuterol (DUONEB) 0.5-2.5 (3) MG/3ML SOLN use 1 vial by nebulization every 4 (four) hours as needed. 03/06/21   Mariel Aloe, MD  levothyroxine (SYNTHROID) 150 MCG tablet Take 150 mcg by mouth daily before breakfast.    [provider]  nitroGLYCERIN (NITROSTAT) 0.4 MG SL tablet Place 0.4 mg under the tongue every 5 (five) minutes as needed for chest pain.    [provider]  oxyCODONE-acetaminophen (PERCOCET/ROXICET) 5-325 MG tablet Take 1 tablet by mouth in the morning, at noon, and at bedtime.    [provider]  SYMBICORT 160-4.5 MCG/ACT inhaler Inhale 2 puffs into the lungs 2 (two) times daily.  11/14/17   [provider]  venlafaxine XR (EFFEXOR-XR) 150 MG 24 hr capsule Take 150 mg by mouth at bedtime.    [provider]  VITAMIN D PO Take 1 tablet by mouth daily.    [provider]    Physical Exam: Vitals:   06/16/21 1300 06/16/21 1400 06/16/21 1500 06/16/21 1623  BP: 110/76 (!) 107/58 108/65 (!) 109/51  Pulse: 88 88 95 100  Resp: _0 Temp:      TempSrc:      SpO2: 100% 100% 100% 99%  Weight:      Height:        Constitutional: NAD, calm, comfortable, chronically ill in appearance, appears older than stated age Eyes: PERRL, lids and  conjunctivae normal ENMT: Mucous membranes are moist. Posterior pharynx clear of any exudate or lesions.poor dentition Neck: normal, supple, no masses, no thyromegaly Respiratory: Diffuse mid to late expiratory wheezing bilaterally, no crackles, normal Respaire effort without accessory muscle use, on 2 L nasal cannula Cardiovascular: Regular rate and rhythm, no murmurs / rubs / gallops. No extremity edema. 2+ pedal pulses. No carotid bruits.  Abdomen: no tenderness, no masses palpated. No hepatosplenomegaly. Bowel sounds positive.  Musculoskeletal: Bilateral lower extremity wounds to level of knee with surrounding erythema, weeping drainage, stage II decubitus sacral ulcer noted Skin: Chronic skin changes bilateral lower extremities with associated erythema, weeping drainage, edema to knee, stage II sacral decubitus ulcer, otherwise no other rashes, lesions, ulcers. No induration Neurologic: CN 2-12 grossly intact. Sensation intact, DTR normal. Strength 5/5 in all 4.  Psychiatric: Poor judgment and insight. Alert and oriented x 3. Normal mood.          Labs on Admission: I have personally reviewed following labs and imaging studies  CBC: Recent Labs  Lab 06/16/21 1254 06/16/21 1255  WBC  --  13.1*  NEUTROABS  --  10.5*  HGB 12.6 10.4*  HCT 37.0 36.2  MCV  --  83.6  PLT  --  882*   Basic Metabolic Panel: Recent Labs  Lab 06/16/21 1230 06/16/21 1254  NA 136 140  K 3.5 3.5  CL 98 97*  CO2 32  --   GLUCOSE 101* 102*  BUN 14 15  CREATININE 0.75 0.70  CALCIUM 8.3*  --    GFR: Estimated Creatinine Clearance: 77.3 mL/min (by C-G formula based on SCr of 0.7 mg/dL). Liver Function Tests: Recent Labs  Lab 06/16/21 1230  AST 22  ALT 16  ALKPHOS 119  BILITOT 0.6  PROT 7.8  ALBUMIN 2.1*   Recent Labs  Lab 06/16/21 1230  LIPASE 25   No results for input(s): AMMONIA in the last 168 hours. Coagulation Profile: Recent Labs  Lab 06/16/21 1255  INR 1.3*   Cardiac  Enzymes: No results for input(s): CKTOTAL, CKMB, CKMBINDEX, TROPONINI in the last 168 hours. BNP (last 3 results) No results for input(s): PROBNP in the last 8760 hours. HbA1C: No results for input(s): HGBA1C in the last 72 hours. CBG: No results for input(s): GLUCAP in the last 168 hours. Lipid Profile: No results for input(s): CHOL, HDL, LDLCALC, TRIG, CHOLHDL, LDLDIRECT in the last 72 hours. Thyroid Function Tests: No results for input(s): TSH, T4TOTAL, FREET4, T3FREE, THYROIDAB in the last 72 hours. Anemia Panel: No results for input(s): VITAMINB12, FOLATE, FERRITIN, TIBC, IRON, RETICCTPCT in the last 72 hours. Urine analysis:    Component Value Date/Time   COLORURINE YELLOW 04/04/2018 1439   APPEARANCEUR HAZY (A) 04/04/2018 1439   LABSPEC 1.002 (L) 04/04/2018 1439   PHURINE 8.0 04/04/2018 1439   GLUCOSEU NEGATIVE 04/04/2018 1439   HGBUR MODERATE (A) 04/04/2018 1439   BILIRUBINUR NEGATIVE 04/04/2018 1439   KETONESUR NEGATIVE 04/04/2018 1439   PROTEINUR NEGATIVE 04/04/2018 1439   UROBILINOGEN 0.2 04/14/2011 1651   NITRITE NEGATIVE 04/04/2018 1439   LEUKOCYTESUR SMALL (A) 04/04/2018 1439    Radiological Exams on Admission: DG Tibia/Fibula Left  Result Date: 06/16/2021 CLINICAL DATA:  Lower extremity edema EXAM: LEFT TIBIA AND FIBULA - 2 VIEW COMPARISON:  None. FINDINGS: There is no evidence of fracture or other focal bone lesions. No erosion or periosteal elevation. Diffusely edematous appearance of the soft tissues. No soft tissue gas. IMPRESSION: Edematous appearance of the soft tissues of the left lower extremity. No soft tissue gas. No radiographic evidence of osteomyelitis. Electronically Signed   By: Davina Poke D.O.   On: 06/16/2021 13:27   DG Tibia/Fibula Right  Result Date: 06/16/2021 CLINICAL DATA:  Fall today with inability to get up. Diffuse weeping edema in both legs with decubitus ulcers. Questionable sepsis. EXAM: RIGHT TIBIA AND FIBULA - 2 VIEW  COMPARISON:  None. FINDINGS: The bones appear adequately mineralized. There is no evidence of acute fracture or dislocation. There is no evidence of bone destruction to suggest osteomyelitis. Advanced degenerative changes are present at the right knee with tricompartmental joint space narrowing and lateral subluxation. There is generalized soft tissue edema without evidence of foreign body or soft tissue emphysema. IMPRESSION: No acute osseous findings or evidence of osteomyelitis. Nonspecific generalized soft tissue edema without foreign body. Advanced right knee osteoarthritis. Electronically Signed   By: Richardean Sale M.D.   On: 06/16/2021 13:28   CT Head Wo Contrast  Result Date: 06/16/2021 CLINICAL DATA:  Status post fall. EXAM: CT HEAD  WITHOUT CONTRAST TECHNIQUE: Contiguous axial images were obtained from the base of the skull through the vertex without intravenous contrast. COMPARISON:  None. FINDINGS: Brain: There is mild cerebral atrophy with widening of the extra-axial spaces and ventricular dilatation. There are areas of decreased attenuation within the white matter tracts of the supratentorial brain, consistent with microvascular disease changes. Vascular: No hyperdense vessel or unexpected calcification. Skull: Normal. Negative for fracture or focal lesion. Sinuses/Orbits: There is marked severity right maxillary sinus mucosal thickening. Mild left maxillary sinus and right ethmoid sinus mucosal thickening is seen. A small left maxillary sinus air-fluid level is also present. Other: None. IMPRESSION: 1. Mild cerebral atrophy. 2. No acute intracranial abnormality. 3. Paranasal sinus disease, as described above. Electronically Signed   By: Virgina Norfolk M.D.   On: 06/16/2021 16:08   CT Cervical Spine Wo Contrast  Result Date: 06/16/2021 CLINICAL DATA:  Status post fall. EXAM: CT CERVICAL SPINE WITHOUT CONTRAST TECHNIQUE: Multidetector CT imaging of the cervical spine was performed without  intravenous contrast. Multiplanar CT image reconstructions were also generated. COMPARISON:  None. FINDINGS: Alignment: Normal. Skull base and vertebrae: No acute fracture. No primary bone lesion or focal pathologic process. Soft tissues and spinal canal: No prevertebral fluid or swelling. No visible canal hematoma. Disc levels: Moderate severity endplate sclerosis and osteophyte formation are seen at the levels of C4-C5, C5-C6 and C6-C7. There is moderate severity narrowing of the anterior atlantoaxial articulation. Mild intervertebral disc space narrowing is seen at C4-C5, C5-C6 and C6-C7. Bilateral mild-to-moderate severity multilevel facet joint hypertrophy is noted. Upper chest: Negative. Other: None. IMPRESSION: 1. Moderate severity degenerative changes of the cervical spine, as described above. 2. No evidence of an acute fracture or subluxation. Electronically Signed   By: Virgina Norfolk M.D.   On: 06/16/2021 16:01   CT THORACIC SPINE WO CONTRAST  Result Date: 06/16/2021 CLINICAL DATA:  Back pain.  Fall. EXAM: CT THORACIC SPINE WITHOUT CONTRAST TECHNIQUE: Multidetector CT images of the thoracic spine were obtained using the standard protocol without intravenous contrast. COMPARISON:  CTA chest 02/28/2021 FINDINGS: Alignment: Normal. Vertebrae: No acute fracture or destructive osseous process in the thoracic spine. Largely bridging vertebral osteophytes throughout the thoracic spine. Paraspinal and other soft tissues: No acute abnormality identified in the paraspinal soft tissues. Aortic and coronary atherosclerosis. Unchanged scarring or atelectasis in the lingula. Minimal dependent atelectasis in the right lower lobe. Disc levels: Spondylosis without evidence of high-grade spinal canal stenosis on this non myelographic examination. Scattered thoracic facet arthrosis including asymmetric right-sided facet spurring and ligamentum flavum calcification on the right at T12-L1 resulting an right lateral  recess and medial right neural foraminal stenosis. IMPRESSION: 1. No acute osseous abnormality in the thoracic spine. 2. Diffuse thoracic spondylosis without evidence of high-grade spinal canal stenosis. 3. Aortic Atherosclerosis (ICD10-I70.0). Electronically Signed   By: Logan Bores M.D.   On: 06/16/2021 16:17   CT ABDOMEN PELVIS W CONTRAST  Result Date: 06/16/2021 CLINICAL DATA:  Status post fall. EXAM: CT ABDOMEN AND PELVIS WITH CONTRAST TECHNIQUE: Multidetector CT imaging of the abdomen and pelvis was performed using the standard protocol following bolus administration of intravenous contrast. CONTRAST:  175m OMNIPAQUE IOHEXOL 350 MG/ML SOLN COMPARISON:  April 04, 2018 FINDINGS: Lower chest: No acute abnormality. Hepatobiliary: No focal liver abnormality is seen. Status post cholecystectomy. No biliary dilatation. Pancreas: Unremarkable. No pancreatic ductal dilatation or surrounding inflammatory changes. Spleen: Normal in size without focal abnormality. Adrenals/Urinary Tract: Adrenal glands are unremarkable. Kidneys are normal, without renal  calculi, focal lesion, or hydronephrosis. Bladder is unremarkable. Stomach/Bowel: Stomach is within normal limits. Appendix appears normal. No evidence of bowel wall thickening, distention, or inflammatory changes. Vascular/Lymphatic: Aortic atherosclerosis. No enlarged abdominal or pelvic lymph nodes. Reproductive: Status post hysterectomy. No adnexal masses. Other: No abdominal wall hernia or abnormality. No abdominopelvic ascites. Musculoskeletal: No acute osseous abnormalities are identified. Multilevel marked severity degenerative changes are seen at the levels of L3-L4, L4-L5 and L5-S1. IMPRESSION: 1. No evidence of acute or active process within the abdomen or pelvis. 2. Evidence of prior cholecystectomy. 3. Aortic atherosclerosis. 4. Degenerative changes within the mid and lower lumbar spine. Aortic Atherosclerosis (ICD10-I70.0). Electronically Signed    By: Virgina Norfolk M.D.   On: 06/16/2021 16:06   CT L-SPINE NO CHARGE  Result Date: 06/16/2021 CLINICAL DATA:  Back pain.  Fall. EXAM: CT LUMBAR SPINE WITH CONTRAST TECHNIQUE: Technique: Multiplanar CT images of the lumbar spine were reconstructed from contemporary CT of the Abdomen and Pelvis. CONTRAST:  No additional COMPARISON:  CT abdomen and pelvis 04/04/2018 FINDINGS: Segmentation: Transitional lumbosacral anatomy with partial lumbarization of S1. Alignment: Mild lumbar levoscoliosis. Straightening of the normal lumbar lordosis. No significant listhesis in the sagittal plane. Vertebrae: No acute fracture or suspicious osseous lesion. Advanced disc space narrowing and prominent degenerative endplate sclerosis and vacuum disc from L3-4 to L5-S1. Bulky anterolateral vertebral osteophytes throughout the lumbar spine greater than right. Paraspinal and other soft tissues: No acute abnormality identified in the paraspinal soft tissues. Asymmetric right psoas muscle atrophy. Intra-abdominal and pelvic contents reported separately. Disc levels: Disc bulging, endplate spurring, and severe facet hypertrophy result in spinal stenosis from L2-3 to L5-S1 which appears severe at L3-4 and L4-5. There is widespread neural foraminal stenosis which appears severe on the right at L3-4, moderate on the left at L4-5, and moderate bilaterally at L5-S1. IMPRESSION: 1. No acute osseous abnormality in the lumbar spine. 2. Advanced lumbar disc and facet degeneration with severe spinal stenosis at L3-4 and L4-5. 3. Widespread neural foraminal stenosis, severe on the right at L3-4. Electronically Signed   By: Logan Bores M.D.   On: 06/16/2021 16:24   DG Chest Port 1 View  Result Date: 06/16/2021 CLINICAL DATA:  Fall EXAM: PORTABLE CHEST 1 VIEW COMPARISON:  None. FINDINGS: The heart size and mediastinal contours are within normal limits. Both lungs are clear. The visualized skeletal structures are unremarkable. IMPRESSION: No  acute cardiopulmonary abnormality. Electronically Signed   By: Yetta Glassman M.D.   On: 06/16/2021 13:23   DG Foot Complete Left  Result Date: 06/16/2021 CLINICAL DATA:  Fall today with inability to get up. Diffuse weeping edema in both legs with decubitus ulcers. Questionable sepsis. EXAM: LEFT FOOT - COMPLETE 3+ VIEW COMPARISON:  None. FINDINGS: The bones appear adequately mineralized. No evidence of acute fracture, dislocation or bone destruction. There is smooth cortical thickening of the 2nd and 4th metatarsal shafts. There are advanced degenerative changes of the 1st metatarsophalangeal joint with joint space narrowing and osteophytes. Or prominent dorsal osteophytes are present at the Lisfranc joint. There is mild generalized soft tissue edema without evidence of foreign body or soft tissue emphysema. IMPRESSION: No acute osseous findings or radiographic evidence of osteomyelitis. Nonspecific soft tissue swelling without evidence of foreign body. Electronically Signed   By: Richardean Sale M.D.   On: 06/16/2021 13:27   DG Foot Complete Right  Result Date: 06/16/2021 CLINICAL DATA:  Sepsis. EXAM: RIGHT FOOT COMPLETE - 3+ VIEW COMPARISON:  None. FINDINGS: The joint  spaces are maintained. No acute bony findings or destructive bony changes to suggest osteomyelitis. IMPRESSION: No acute bony findings. Electronically Signed   By: Marijo Sanes M.D.   On: 06/16/2021 13:35    EKG: Independently reviewed.   Assessment/Plan Principal Problem:   Wound cellulitis Active Problems:   GERD (gastroesophageal reflux disease)   Shortness of breath   Depression   COPD exacerbation (HCC)   Acute respiratory failure with hypoxia (HCC)   Hypothyroidism   HLD (hyperlipidemia)   Anxiety   Fall at home, initial encounter   Adult failure to thrive   Bilateral lower extremity cellulitis Patient reports follows with orthopedics outpatient, Dr. Sharol Given.  Has a RN that comes to the house 1-2 times per week.   Plain film x-rays bilateral feet and tip/fib with soft tissue edema with no signs of osteomyelitis.  WBC count elevated 13.1, afebrile.  Lactic acid elevated 2.0. --ESR pending --Wound care consult --NS at 1 mL/h x 1 day --Ceftriaxone 1 g IV q24h --Trend lactic acid --CBC daily  Acute respiratory failure with hypoxia COPD exacerbation Patient reporting progressive shortness of breath, reported to be hypoxic on EMS arrival, placed on 2 L nasal cannula.  Not oxygen dependent at baseline. --Prednisone 40 mg p.o. daily for 5 days --Doxycycline 100 mg p.o. twice daily x7 days --DuoNebs every 4 hours scheduled --Albuterol nebs as needed wheezing/shortness of breath --Continue supplemental oxygen, maintain SPO2 greater than 88%  Mechanical fall Adult failure to thrive Patient reports mechanical fall at home, coming from the bathroom.  Utilizes a walker at baseline.  Reports on the floor for roughly 30 minutes before crawling to the couch.  Apparently house is disheveled in appearance with bedbugs reportedly present.  CT head/C-spine with no acute fracture/subluxation, no acute intracranial abnormality. --PT/OT evaluation --TOC evaluation for likely need of placement, given poor living conditions reported  Stage II sacral decubitus ulcer, POA --Wound care consult --Continue offloading, local wound care  Depression/anxiety --Wellbutrin 150 mg p.o. daily --Venlafaxine 150 mg p.o. daily --Alprazolam 1 mg p.o. 3 times daily as needed anxiety  Hypothyroidism --TSH --Levothyroxine 150 mcg p.o. daily  HLD: Atorvastatin 10 mg p.o. daily  GERD: Continue PPI    DVT prophylaxis: Lovenox Code Status: Full code Family Communication: No family present at bedside this afternoon Disposition Plan: Likely will need placement SNF versus LTC Consults called: None Admission status: Inpatient Level of care: Med-Surg   Severity of Illness: The appropriate patient status for this patient is  INPATIENT. Inpatient status is judged to be reasonable and necessary in order to provide the required intensity of service to ensure the patient's safety. The patient's presenting symptoms, physical exam findings, and initial radiographic and laboratory data in the context of their chronic comorbidities is felt to place them at high risk for further clinical deterioration. Furthermore, it is not anticipated that the patient will be medically stable for discharge from the hospital within 2 midnights of admission. The following factors support the patient status of inpatient.   " The patient's presenting symptoms include back pain, pain to bilateral lower extremities, shortness of breath " The worrisome physical exam findings include chronic lower extremity weeping wounds with surrounding erythema, wheezing " The initial radiographic and laboratory data are worrisome because of WBC count 13.1, lactic acid 2.0 imaging studies with soft tissue edema " The chronic co-morbidities include COPD, depression/anxiety, hypothyroidism, GERD, obesity, hyperlipidemia.   * I certify that at the point of admission it is my clinical judgment that  the patient will require inpatient hospital care spanning beyond 2 midnights from the point of admission due to high intensity of service, high risk for further deterioration and high frequency of surveillance required.*    Ravon Mcilhenny J [place] DO [facility] Available via Epic secure chat 7am-7pm After these hours, please refer to coverage provider listed on [email or web address] 06/16/2021, 4:44 PM

## 2021-06-16 NOTE — ED Notes (Signed)
Permission obtained from pt to update [employer] on pt status and POC, called placed and update given, Ms. Maxwell reports pt is supposed to have home health coming out but nurse never comes, neighbor expresses concern for safety and health of pt living by herself

## 2021-06-16 NOTE — ED Provider Notes (Signed)
[facility]-EMERGENCY DEPT Provider Note   CSN: [id number] Arrival date & time: 06/16/21  1129     History Chief Complaint  Patient presents with   Failure To Thrive   Weakness    [name] is a 65 y.o. female with past medical history significant for COPD, hypertension, depression who presents in the unfortunate state after having a fall at home in which she fell backwards into the tub injuring with feels the patient to be her entire back.  Patient denies any numbness and tingling of her legs.  There are additional concerns brought up by EMS including multiple late stage decubitus ulcers on the buttock, swelling, foul smell, redness, signs of infection on bilateral legs.  Patient also reports recent diarrhea.  Patient reports shortness of breath, is not normally on oxygen at home, is now on 4 L nasal cannula.  Patient denies history of heart failure, heart attack.  Patient denies chest pain.  Is also reporting patient lives in [place] condition with bedbug infestation.  Patient reports that she has no help at home, lives with a dog, plans to have another nurse at some time.  Patient does endorse some recent fever, chills.  Patient rates pain 10/10 at this time.   Weakness Associated symptoms: abdominal pain, diarrhea, fever and shortness of breath       Past Medical History:  Diagnosis Date   COPD (chronic obstructive pulmonary disease) (HCC)    Gout    Hypercholesteremia     Patient Active Problem List   Diagnosis Date Noted   Arm mass, right 03/04/2021   Acute respiratory failure with hypoxia (HCC) 03/04/2021   COPD exacerbation (HCC) 03/01/2021   Acute exacerbation of chronic obstructive pulmonary disease (COPD) (HCC) 02/28/2021   Pyelonephritis 04/04/2018   Depression 04/04/2018   Venous insufficiency (chronic) (peripheral) 01/11/2018   BRONCHITIS, ACUTE 10/21/2008   Essential hypertension 07/13/2007   Asthma 07/11/2007   Esophageal reflux  07/11/2007   SLEEP APNEA 07/11/2007   SHORTNESS OF BREATH 07/11/2007   COUGH 07/11/2007    Past Surgical History:  Procedure Laterality Date   ABDOMINAL HYSTERECTOMY     ROTATOR CUFF REPAIR       OB History   No obstetric history on file.     No family history on file.  Social History   Tobacco Use   Smoking status: Every Day    Packs/day: 1.00    Years: 30.00    Pack years: 30.00    Types: Cigarettes   Smokeless tobacco: Never  Substance Use Topics   Alcohol use: No    Alcohol/week: 0.0 standard drinks   Drug use: No    Home Medications Prior to Admission medications   Medication Sig Start Date End Date Taking? Authorizing Provider  ALPRAZolam Prudy Feeler) 1 MG tablet Take 1 mg by mouth 3 (three) times daily. 03/22/18   [provider]  atorvastatin (LIPITOR) 10 MG tablet Take 10 mg by mouth daily.  10/10/17   [provider]  buPROPion (WELLBUTRIN XL) 150 MG 24 hr tablet Take 150 mg by mouth every evening.     [provider]  Cyanocobalamin (VITAMIN B-12 PO) Take 1 tablet by mouth daily.    [provider]  DEXILANT 60 MG capsule Take 60 mg by mouth daily. 12/27/17   [provider]  gabapentin (NEURONTIN) 300 MG capsule Take 300 mg by mouth 3 (three) times daily.    [provider]  ipratropium-albuterol (DUONEB) 0.5-2.5 (3)  MG/3ML SOLN use 1 vial by nebulization every 4 (four) hours as needed. 03/06/21   Mariel Aloe, MD  levothyroxine (SYNTHROID) 150 MCG tablet Take 150 mcg by mouth daily before breakfast.    [provider]  nitroGLYCERIN (NITROSTAT) 0.4 MG SL tablet Place 0.4 mg under the tongue every 5 (five) minutes as needed for chest pain.    [provider]  oxyCODONE-acetaminophen (PERCOCET/ROXICET) 5-325 MG tablet Take 1 tablet by mouth in the morning, at noon, and at bedtime.    [provider]  SYMBICORT 160-4.5 MCG/ACT inhaler Inhale 2 puffs into the lungs 2 (two) times daily.   11/14/17   [provider]  venlafaxine XR (EFFEXOR-XR) 150 MG 24 hr capsule Take 150 mg by mouth at bedtime.    [provider]  VITAMIN D PO Take 1 tablet by mouth daily.    [provider]    Allergies    Patient has no known allergies.  Review of Systems   Review of Systems  Constitutional:  Positive for chills and fever.  Respiratory:  Positive for shortness of breath.   Gastrointestinal:  Positive for abdominal pain and diarrhea.  Musculoskeletal:  Positive for back pain.  Skin:  Positive for color change, rash and wound.  Neurological:  Positive for weakness.   Physical Exam Updated Vital Signs BP 108/65   Pulse 95   Temp (!) 96.5 F (35.8 C) (Rectal)   Resp 16   Ht 5\' 4"  (1.626 m)   Wt 90.2 kg   SpO2 100%   BMI 34.13 kg/m   Physical Exam Vitals and nursing note reviewed.  Constitutional:      General: She is not in acute distress.    Appearance: Normal appearance. She is ill-appearing.  HENT:     Head: Normocephalic and atraumatic.  Eyes:     General:        Right eye: No discharge.        Left eye: No discharge.  Cardiovascular:     Rate and Rhythm: Regular rhythm. Tachycardia present.     Heart sounds: No murmur heard.   No friction rub. No gallop.     Comments: Difficult to palpate DP/PT pulses 2/2 swelling, edema -- popliteal pulses palpated bilaterally. Intact capillary refill distal toes. Pulmonary:     Breath sounds: Normal breath sounds.  Abdominal:     Tenderness: There is abdominal tenderness.     Comments: Protuberant abdomen with tenderness throughout, no rebound, rigidity, guarding  Musculoskeletal:     Cervical back: Tenderness present.     Right lower leg: Edema present.     Left lower leg: Edema present.  Skin:    General: Skin is warm and dry.     Capillary Refill: Capillary refill takes less than 2 seconds.     Comments: 2-3+ edema on bilateral lower extremities with weeping ulcers, signs of cellulitis  including erythema, TTP, foul odor, injury to toe nails  Red, macerated, extremely tender skin on the anus with at least grade 2 decubitous ulcers noted. Foul smell from gluteal cleft.  Neurological:     Mental Status: She is alert and oriented to person, place, and time.  Psychiatric:        Mood and Affect: Mood normal.        Behavior: Behavior normal.    ED Results / Procedures / Treatments   Labs (all labs ordered are listed, but only abnormal results are displayed) Labs Reviewed  LACTIC ACID,  PLASMA - Abnormal; Notable for the following components:      Result Value   Lactic Acid, Venous 2.0 (*)    All other components within normal limits  CBC WITH DIFFERENTIAL/PLATELET - Abnormal; Notable for the following components:   WBC 13.1 (*)    Hemoglobin 10.4 (*)    MCH 24.0 (*)    MCHC 28.7 (*)    RDW 21.6 (*)    Platelets 561 (*)    Neutro Abs 10.5 (*)    All other components within normal limits  PROTIME-INR - Abnormal; Notable for the following components:   Prothrombin Time 15.8 (*)    INR 1.3 (*)    All other components within normal limits  COMPREHENSIVE METABOLIC PANEL - Abnormal; Notable for the following components:   Glucose, Bld 101 (*)    Calcium 8.3 (*)    Albumin 2.1 (*)    All other components within normal limits  I-STAT CHEM 8, ED - Abnormal; Notable for the following components:   Chloride 97 (*)    Glucose, Bld 102 (*)    Calcium, Ion 1.08 (*)    TCO2 34 (*)    All other components within normal limits  RESP PANEL BY RT-PCR (FLU A&B, COVID) ARPGX2  CULTURE, BLOOD (ROUTINE X 2)  CULTURE, BLOOD (ROUTINE X 2)  URINE CULTURE  LACTIC ACID, PLASMA  APTT  BRAIN NATRIURETIC PEPTIDE  LIPASE, BLOOD  URINALYSIS, ROUTINE W REFLEX MICROSCOPIC  C-REACTIVE PROTEIN  SEDIMENTATION RATE    EKG EKG Interpretation  Date/Time:  Tuesday June 16 2021 12:27:36 EST Ventricular Rate:  110 PR Interval:  153 QRS Duration: 103 QT Interval:  350 QTC  Calculation: 474 R Axis:   90 Text Interpretation: Sinus tachycardia Ventricular premature complex Consider right atrial enlargement Borderline right axis deviation No acute changes Confirmed by Varney Biles [id number]) on 06/16/2021 12:49:59 PM  Radiology DG Tibia/Fibula Left  Result Date: 06/16/2021 CLINICAL DATA:  Lower extremity edema EXAM: LEFT TIBIA AND FIBULA - 2 VIEW COMPARISON:  None. FINDINGS: There is no evidence of fracture or other focal bone lesions. No erosion or periosteal elevation. Diffusely edematous appearance of the soft tissues. No soft tissue gas. IMPRESSION: Edematous appearance of the soft tissues of the left lower extremity. No soft tissue gas. No radiographic evidence of osteomyelitis. Electronically Signed   By: Davina Poke D.O.   On: 06/16/2021 13:27   DG Tibia/Fibula Right  Result Date: 06/16/2021 CLINICAL DATA:  Fall today with inability to get up. Diffuse weeping edema in both legs with decubitus ulcers. Questionable sepsis. EXAM: RIGHT TIBIA AND FIBULA - 2 VIEW COMPARISON:  None. FINDINGS: The bones appear adequately mineralized. There is no evidence of acute fracture or dislocation. There is no evidence of bone destruction to suggest osteomyelitis. Advanced degenerative changes are present at the right knee with tricompartmental joint space narrowing and lateral subluxation. There is generalized soft tissue edema without evidence of foreign body or soft tissue emphysema. IMPRESSION: No acute osseous findings or evidence of osteomyelitis. Nonspecific generalized soft tissue edema without foreign body. Advanced right knee osteoarthritis. Electronically Signed   By: Richardean Sale M.D.   On: 06/16/2021 13:28   DG Chest Port 1 View  Result Date: 06/16/2021 CLINICAL DATA:  Fall EXAM: PORTABLE CHEST 1 VIEW COMPARISON:  None. FINDINGS: The heart size and mediastinal contours are within normal limits. Both lungs are clear. The visualized skeletal structures are  unremarkable. IMPRESSION: No acute cardiopulmonary abnormality. Electronically Signed   By: Denny Peon  Strickland M.D.   On: 06/16/2021 13:23   DG Foot Complete Left  Result Date: 06/16/2021 CLINICAL DATA:  Fall today with inability to get up. Diffuse weeping edema in both legs with decubitus ulcers. Questionable sepsis. EXAM: LEFT FOOT - COMPLETE 3+ VIEW COMPARISON:  None. FINDINGS: The bones appear adequately mineralized. No evidence of acute fracture, dislocation or bone destruction. There is smooth cortical thickening of the 2nd and 4th metatarsal shafts. There are advanced degenerative changes of the 1st metatarsophalangeal joint with joint space narrowing and osteophytes. Or prominent dorsal osteophytes are present at the Lisfranc joint. There is mild generalized soft tissue edema without evidence of foreign body or soft tissue emphysema. IMPRESSION: No acute osseous findings or radiographic evidence of osteomyelitis. Nonspecific soft tissue swelling without evidence of foreign body. Electronically Signed   By: Richardean Sale M.D.   On: 06/16/2021 13:27   DG Foot Complete Right  Result Date: 06/16/2021 CLINICAL DATA:  Sepsis. EXAM: RIGHT FOOT COMPLETE - 3+ VIEW COMPARISON:  None. FINDINGS: The joint spaces are maintained. No acute bony findings or destructive bony changes to suggest osteomyelitis. IMPRESSION: No acute bony findings. Electronically Signed   By: Marijo Sanes M.D.   On: 06/16/2021 13:35    Procedures Procedures   Medications Ordered in ED Medications  morphine 2 MG/ML injection 2 mg (2 mg Intravenous Given 06/16/21 1351)  iohexol (OMNIPAQUE) 350 MG/ML injection 100 mL (100 mLs Intravenous Contrast Given 06/16/21 1517)  sodium chloride (PF) 0.9 % injection (  Given by Other 06/16/21 1527)    ED Course  I have reviewed the triage vital signs and the nursing notes.  Pertinent labs & imaging results that were available during my care of the patient were reviewed by me and  considered in my medical decision making (see chart for details).    MDM Rules/Calculators/A&P                         I discussed this case with my attending physician who cosigned this note including patient's presenting symptoms, physical exam, and planned diagnostics and interventions. Attending physician stated agreement with plan or made changes to plan which were implemented.   Attending physician assessed patient at bedside.  Extremely ill-appearing female in significant distress secondary to back pain, buttock pain, leg pain.  Evidence of cellulitis, edema bilateral lower extremities.  Foul-smelling odor permeates the room.  Significant decubitus ulcers at least grade 2 on bilateral buttocks with foul odor.  Initial radiographic imaging does not reveal evidence of osteomyelitis.  However concern for cellulitis based on appearance of legs.  Patient's new complaints today include back pain secondary to fall, as well as decubitus ulcers of only been present for 1 week per patient.  Show lab work-up overall unremarkable other than elevated repeat lactic acid of 2.0.  Slightly elevated white count, mild anemia.  Metabolic function without significant abnormality.  Patient does have tender abdomen, without signs of acute abdomen, surgical abdomen at this time.  Will obtain CT abdomen pelvis for thoroughness of work-up.  Radiographic imaging of the spine pending at this time.  However patient without any neurologic deficit on exam.  Do believe the patient warrants admission for failure to thrive, and need for extensive wound care consult. Although some of workup still pending, consulted for admission at this time.  3:51 PM Care of [name] transferred to Dr. Alvino Chapel at the end of my shift as the patient will require reassessment  once labs/imaging have resulted. Patient presentation, ED course, and plan of care discussed with review of all pertinent labs and imaging. Please see his/her note  for further details regarding further ED course and disposition. Plan at time of handoff is admission for failure to thrive, cellulitis, extensive wound care consult. This may be altered or completely changed at the discretion of the oncoming team pending results of further workup.  Final Clinical Impression(s) / ED Diagnoses Final diagnoses:  Back pain  Failure to thrive in adult  Pressure injury of buttock, stage 2, unspecified laterality [facility])    Rx / DC Orders ED Discharge Orders     None        Anselmo Pickler, PA-C 06/16/21 [place]    Varney Biles, MD 06/17/21 1549    Varney Biles, MD 06/17/21 1559

## 2021-06-16 NOTE — ED Notes (Signed)
Purwick placed on pt

## 2021-06-16 NOTE — ED Notes (Signed)
Patients friend called and is very close to the patient since she lives by herself. Wanted to know if you could call with an update: [name] [phone]  Also, taking care of patients dog and wants to know how long to take care of the pet? ** * If not able please let the patient know she called

## 2021-06-16 NOTE — ED Triage Notes (Signed)
BIBA coming from home.   Per EMS pt had a fall this am and unable to get up. Pt's neighbor came over and called [phone]. EMS reports pt has weeping edema on legs and decubitus ulcers. EMS also reports pt lives alone in [place] conditions with a bedbug infestation.   BP 110/70 95% 4LNC. HR 100 CBG 152

## 2021-06-17 ENCOUNTER — Encounter (HOSPITAL_COMMUNITY): Payer: Self-pay | Admitting: Internal Medicine

## 2021-06-17 DIAGNOSIS — L0390 Cellulitis, unspecified: Secondary | ICD-10-CM | POA: Diagnosis not present

## 2021-06-17 LAB — COMPREHENSIVE METABOLIC PANEL
ALT: 15 U/L (ref 0–44)
AST: 19 U/L (ref 15–41)
Albumin: 1.9 g/dL — ABNORMAL LOW (ref 3.5–5.0)
Alkaline Phosphatase: 107 U/L (ref 38–126)
Anion gap: 4 — ABNORMAL LOW (ref 5–15)
BUN: 10 mg/dL (ref 8–23)
CO2: 33 mmol/L — ABNORMAL HIGH (ref 22–32)
Calcium: 8 mg/dL — ABNORMAL LOW (ref 8.9–10.3)
Chloride: 101 mmol/L (ref 98–111)
Creatinine, Ser: 0.68 mg/dL (ref 0.44–1.00)
GFR, Estimated: >60 mL/min (ref >60)
Glucose, Bld: 91 mg/dL (ref 70–99)
Potassium: 3 mmol/L — ABNORMAL LOW (ref 3.5–5.1)
Sodium: 138 mmol/L (ref 135–145)
Total Bilirubin: 0.7 mg/dL (ref 0.3–1.2)
Total Protein: 6.5 g/dL (ref 6.5–8.1)

## 2021-06-17 LAB — MAGNESIUM: Magnesium: 2 mg/dL (ref 1.7–2.4)

## 2021-06-17 LAB — CBC
HCT: 32.3 % — ABNORMAL LOW (ref 36.0–46.0)
Hemoglobin: 9.3 g/dL — ABNORMAL LOW (ref 12.0–15.0)
MCH: 24 pg — ABNORMAL LOW (ref 26.0–34.0)
MCHC: 28.8 g/dL — ABNORMAL LOW (ref 30.0–36.0)
MCV: 83.5 fL (ref 80.0–100.0)
Platelets: 475 10*3/uL — ABNORMAL HIGH (ref 150–400)
RBC: 3.87 MIL/uL (ref 3.87–5.11)
RDW: 21.2 % — ABNORMAL HIGH (ref 11.5–15.5)
WBC: 10 10*3/uL (ref 4.0–10.5)
nRBC: 0 % (ref 0.0–0.2)

## 2021-06-17 LAB — TSH: TSH: 0.272 u[IU]/mL — ABNORMAL LOW (ref 0.350–4.500)

## 2021-06-17 LAB — LACTIC ACID, PLASMA: Lactic Acid, Venous: 1.3 mmol/L (ref 0.5–1.9)

## 2021-06-17 LAB — SEDIMENTATION RATE: Sed Rate: 90 mm/hr — ABNORMAL HIGH (ref 0–22)

## 2021-06-17 LAB — PROCALCITONIN: Procalcitonin: <0.1 ng/mL

## 2021-06-17 MED ORDER — DOXYCYCLINE HYCLATE 100 MG PO TABS
100.0000 mg | ORAL_TABLET | Freq: Two times a day (BID) | ORAL | Status: DC
  Administered 2021-06-17 – 2021-06-23 (×13): 100 mg via ORAL
  Filled 2021-06-17 (×13): qty 1

## 2021-06-17 MED ORDER — BUPROPION HCL ER (XL) 150 MG PO TB24
150.0000 mg | ORAL_TABLET | Freq: Every evening | ORAL | Status: DC
  Administered 2021-06-17 – 2021-06-22 (×6): 150 mg via ORAL
  Filled 2021-06-17 (×6): qty 1

## 2021-06-17 MED ORDER — PREDNISONE 20 MG PO TABS
40.0000 mg | ORAL_TABLET | Freq: Every day | ORAL | Status: AC
  Administered 2021-06-17 – 2021-06-21 (×5): 40 mg via ORAL
  Filled 2021-06-17 (×5): qty 2

## 2021-06-17 MED ORDER — LACTATED RINGERS IV BOLUS
250.0000 mL | Freq: Once | INTRAVENOUS | Status: AC
  Administered 2021-06-17: 250 mL via INTRAVENOUS

## 2021-06-17 MED ORDER — ZINC OXIDE 40 % EX OINT
TOPICAL_OINTMENT | Freq: Four times a day (QID) | CUTANEOUS | Status: DC
  Administered 2021-06-19 – 2021-06-22 (×2): 1 via TOPICAL
  Filled 2021-06-17 (×4): qty 57

## 2021-06-17 MED ORDER — FLUTICASONE FUROATE-VILANTEROL 200-25 MCG/ACT IN AEPB
1.0000 | INHALATION_SPRAY | Freq: Every day | RESPIRATORY_TRACT | Status: DC
  Administered 2021-06-18 – 2021-06-23 (×6): 1 via RESPIRATORY_TRACT
  Filled 2021-06-17: qty 28

## 2021-06-17 MED ORDER — LACTATED RINGERS IV SOLN: INTRAVENOUS | Status: AC

## 2021-06-17 MED ORDER — PANTOPRAZOLE SODIUM 40 MG PO TBEC
40.0000 mg | DELAYED_RELEASE_TABLET | Freq: Every day | ORAL | Status: DC
  Administered 2021-06-17 – 2021-06-23 (×7): 40 mg via ORAL
  Filled 2021-06-17 (×7): qty 1

## 2021-06-17 MED ORDER — POLYETHYLENE GLYCOL 3350 17 G PO PACK: 17.0000 g | PACK | Freq: Every day | ORAL | Status: DC | PRN

## 2021-06-17 MED ORDER — SODIUM CHLORIDE 0.9 % IV SOLN
1.0000 g | INTRAVENOUS | Status: AC
  Administered 2021-06-17 – 2021-06-22 (×6): 1 g via INTRAVENOUS
  Filled 2021-06-17 (×6): qty 10

## 2021-06-17 MED ORDER — ATORVASTATIN CALCIUM 10 MG PO TABS
10.0000 mg | ORAL_TABLET | Freq: Every day | ORAL | Status: DC
  Administered 2021-06-17 – 2021-06-23 (×7): 10 mg via ORAL
  Filled 2021-06-17 (×7): qty 1

## 2021-06-17 MED ORDER — POTASSIUM CHLORIDE CRYS ER 20 MEQ PO TBCR
40.0000 meq | EXTENDED_RELEASE_TABLET | Freq: Once | ORAL | Status: AC
  Administered 2021-06-17: 40 meq via ORAL
  Filled 2021-06-17: qty 2

## 2021-06-17 MED ORDER — VENLAFAXINE HCL ER 150 MG PO CP24
150.0000 mg | ORAL_CAPSULE | Freq: Every day | ORAL | Status: DC
  Administered 2021-06-17 – 2021-06-22 (×6): 150 mg via ORAL
  Filled 2021-06-17 (×6): qty 1

## 2021-06-17 MED ORDER — ALBUTEROL SULFATE (2.5 MG/3ML) 0.083% IN NEBU: 2.5000 mg | INHALATION_SOLUTION | RESPIRATORY_TRACT | Status: DC | PRN

## 2021-06-17 MED ORDER — ENOXAPARIN SODIUM 40 MG/0.4ML IJ SOSY
40.0000 mg | PREFILLED_SYRINGE | INTRAMUSCULAR | Status: DC
  Administered 2021-06-17 – 2021-06-22 (×6): 40 mg via SUBCUTANEOUS
  Filled 2021-06-17 (×7): qty 0.4

## 2021-06-17 MED ORDER — ALPRAZOLAM 1 MG PO TABS
1.0000 mg | ORAL_TABLET | Freq: Three times a day (TID) | ORAL | Status: DC | PRN
  Administered 2021-06-17 – 2021-06-22 (×9): 1 mg via ORAL
  Filled 2021-06-17 (×6): qty 1
  Filled 2021-06-17: qty 2
  Filled 2021-06-17 (×2): qty 1

## 2021-06-17 MED ORDER — IPRATROPIUM-ALBUTEROL 0.5-2.5 (3) MG/3ML IN SOLN
3.0000 mL | Freq: Four times a day (QID) | RESPIRATORY_TRACT | Status: DC
  Filled 2021-06-17: qty 3

## 2021-06-17 MED ORDER — LEVOTHYROXINE SODIUM 75 MCG PO TABS
150.0000 ug | ORAL_TABLET | Freq: Every day | ORAL | Status: DC
  Administered 2021-06-17 – 2021-06-23 (×7): 150 ug via ORAL
  Filled 2021-06-17 (×3): qty 2
  Filled 2021-06-17: qty 1
  Filled 2021-06-17 (×3): qty 2

## 2021-06-17 NOTE — Consult Note (Signed)
WOC Nurse Consult Note: Reason for Consult: Irritant contact dermatitis, Stage 3 pressure injuries to bilateral buttocks, partial and full thickness skin loss to the bilateral LEs.  Patient with poor hygiene, see Provider's notes for housing and social issues. I am assisted in my assessment by a NT. Wound type: Moisture, pressure, venous insufficiency Pressure Injury POA: Yes Measurement: Stage 3 pressure injury to left buttock: 4cm x 2.5cm x 0.2cm with red, moist wound bed, small serous exudate. Proximal to this lesion is a linear presentation of partial thickness skin loss measuring 4cm x 1cm and consistent with friction skin injury. No exudate. Stage 3 pressure injury to right buttock: 3cm x 2cm x 0.2cm with red, moist wound bed and small serous exudate Bilateral LEs with several areas of weeping dermatitis, no ulcerations. Exudate has been allowed to dry and crust over time.  Wound bed: Drainage (amount, consistency, odor) Serous in small to moderate amounts from all lesions Periwound: Erythematous, bilateral LEs with erythema, edema and dried serum, also hemosiderin staining. Dressing procedure/placement/frequency:  Drs. [place] and Natale Milch are sent [employer] today requesting photographs of the buttock lesions. Photos in the chart of the bilateral LEs.  A mattress replacement with low air loss feature has been ordered for management of microclimate as have bilateral pressure redistribution heel boots; guidance for turning and repositioning and topical skin and wound care has been provided. Patient is to be placed on DermaTherapy bed linens only. No disposable bed linens. Our house antimicrobial wicking textile (InterDry Ag+) is provided for the subpannicular skin fold, the patient currently is urine a PurWick for urinary incontinence, but requires frequent skin care to the buttocks.  Desitin is ordered to the buttock lesions 4 times daily and PRN inadvertent removal. The bilateral  LE's are to be dressed with antimicrobial nonadherent gauze (xeroform) and topped with ABD pads and Secured with Kerlix roll gauze applied from just below toes to just below knees. The Kerlix is to be topped with 6-inch ACE bandages wrapped in a similar manner.Feet are to be placed into Prevalon Boots.  WOC nursing team will not follow, but will remain available to this patient, the nursing and medical teams.  Please re-consult if needed. Thanks, Ladona Mow, MSN, RN, GNP, [place]  Pager# [phone]

## 2021-06-17 NOTE — Progress Notes (Signed)
Pravalon boots and interdry applied.

## 2021-06-17 NOTE — Progress Notes (Signed)
Interdry and Prevalon boots ordered.

## 2021-06-17 NOTE — Progress Notes (Signed)
PT Cancellation Note  Patient Details Name: [name] MRN: [id number] DOB: 1956-03-07   Cancelled Treatment:    Reason Eval/Treat Not Completed: Medical issues which prohibited therapy, patient is inpatient and is on  Bedbug precaustions. Will follow up another time.   Blanchard Kelch PT Acute Rehabilitation Services Pager [phone] Office [phone]    Rada Hay 06/17/2021, 10:00 AM

## 2021-06-17 NOTE — Progress Notes (Signed)
PROGRESS NOTE    [name]  EQA:[id number] DOB: [birth year] DOA: 06/16/2021 PCP: Crist Infante, MD   Brief Narrative:  [name] is a 65 y.o. female with medical history significant of COPD, depression/anxiety, GERD, hypothyroidism, chronic lower extremity edema/wounds, hyperlipidemia who presented to [facility] ED on 11/15 via EMS after fall at home.  Patient was found by neighbors in poor state of health, had fallen and had been down for roughly 30 minutes prior to being seen by her neighbors.  Patient reports progressive weakness over the past week, utilizes a walker at baseline.  Also with worsening lower extremity edema and wounds; follows with orthopedics, Dr. Sharol Given outpatient.  Denies any recent medication changes, no sick contacts.  EMS reported home to be infested by bedbugs/fleas and called Adult Protective Services due to the state of her house.  Assessment & Plan:   Sepsis secondary to bilateral lower extremity cellulitis, POA Rule out bug bites/infestation -Noted leukocytosis tachycardia and infection with elevated lactic acidosis -Currently being followed in the outpatient setting twice a week by home nursing, also follows on occasion with orthopedics Dr. Sharol Given  -Imaging unremarkable for osteomyelitis  -ESR and CRP moderately elevated at 90 and 21 respectively -Continue IV antibiotics including ceftriaxone -Patient completed Decon in the ED at intake  Acute respiratory failure with hypoxia secondary to COPD exacerbation -Hypoxia able to take sats in the 80s per report, baseline room air -Continue 2 L nasal cannula supportive care steroids nebs continue p.o. doxycycline   Mechanical fall, ambulatory dysfunction, failure to thrive in the adult, POA -Patient uses a walker at baseline, unable to stand after fall prior to admission, down for 30 minutes -PT OT to continue to follow -Likely unsafe disposition home given her weakness, follow for placement if indicated per PT OT  evaluation -TOC made aware of patient's current living situation Adult Protective Services contacted by EMS at intake, will follow along closely   Stage II sacral decubitus ulcer, POA --Wound care consulted and following   Depression/anxiety --Wellbutrin 150 mg p.o. daily --Venlafaxine 150 mg p.o. daily --Alprazolam 1 mg p.o. 3 times daily as needed anxiety   Hypothyroidism --TSH --Levothyroxine 150 mcg p.o. daily   HLD: Atorvastatin 10 mg p.o. daily   GERD: Continue PPI    DVT prophylaxis: Lovenox Code Status: Full code Family Communication: None present  Status is: Inpatient  Dispo: The patient is from: Home              Anticipated d/c is to: To be determined              Anticipated d/c date is: 48 to 72 hours              Patient currently not medically stable for discharge  Consultants:  Wound care  Procedures:  None  Antimicrobials:  Ceftriaxone, doxycycline 06/16/2021 ongoing  Subjective: No acute issues or events overnight-denies chest pain nausea vomiting diarrhea constipation headache fevers chills  Objective: Vitals:   06/17/21 0600 06/17/21 0641 06/17/21 0700 06/17/21 0800  BP: (!) 92/52 107/79 (!) 101/57 (!) 96/50  Pulse:  98 86 80  Resp: _0 Temp:      TempSrc:      SpO2:  98% 100% 100%  Weight:      Height:        Intake/Output Summary (Last 24 hours) at 06/17/2021 0829 Last data filed at 06/17/2021 0607 Gross per 24 hour  Intake 1350 ml  Output --  Net 1350 ml   Filed Weights   06/16/21 1145  Weight: 90.2 kg    Examination:   General:  Pleasantly resting in bed, No acute distress. HEENT:  Normocephalic atraumatic.  Sclerae nonicteric, noninjected.  Extraocular movements intact bilaterally. Neck:  Without mass or deformity.  Trachea is midline. Lungs: Diminished bilaterally, end expiratory wheeze without overt rhonchi or rales Heart:  Regular rate and rhythm.  Without murmurs, rubs, or gallops. Abdomen:  Soft,  nontender, nondistended.  Without guarding or rebound. Extremities: Without cyanosis, clubbing, 2+ edema Vascular:  Dorsalis pedis and posterior tibial pulses palpable bilaterally. Skin: Sacral decubitus ulcer as above, bilateral lower extremity erythema and wounds as noted on imaging Psych: Poor judgment and insight, alert and oriented x3          Data Reviewed: I have personally reviewed following labs and imaging studies  CBC: Recent Labs  Lab 06/16/21 1254 06/16/21 1255 06/17/21 0445  WBC  --  13.1* 10.0  NEUTROABS  --  10.5*  --   HGB 12.6 10.4* 9.3*  HCT 37.0 36.2 32.3*  MCV  --  83.6 83.5  PLT  --  561* 818*   Basic Metabolic Panel: Recent Labs  Lab 06/16/21 1230 06/16/21 1254 06/17/21 0445  NA 136 140 138  K 3.5 3.5 3.0*  CL 98 97* 101  CO2 32  --  33*  GLUCOSE 101* 102* 91  BUN _0 CREATININE 0.75 0.70 0.68  CALCIUM 8.3*  --  8.0*  MG  --   --  2.0   GFR: Estimated Creatinine Clearance: 77.3 mL/min (by C-G formula based on SCr of 0.68 mg/dL). Liver Function Tests: Recent Labs  Lab 06/16/21 1230 06/17/21 0445  AST 22 19  ALT 16 15  ALKPHOS 119 107  BILITOT 0.6 0.7  PROT 7.8 6.5  ALBUMIN 2.1* 1.9*   Recent Labs  Lab 06/16/21 1230  LIPASE 25   No results for input(s): AMMONIA in the last 168 hours. Coagulation Profile: Recent Labs  Lab 06/16/21 1255  INR 1.3*   Cardiac Enzymes: No results for input(s): CKTOTAL, CKMB, CKMBINDEX, TROPONINI in the last 168 hours. BNP (last 3 results) No results for input(s): PROBNP in the last 8760 hours. HbA1C: No results for input(s): HGBA1C in the last 72 hours. CBG: No results for input(s): GLUCAP in the last 168 hours. Lipid Profile: No results for input(s): CHOL, HDL, LDLCALC, TRIG, CHOLHDL, LDLDIRECT in the last 72 hours. Thyroid Function Tests: Recent Labs    06/17/21 0526  TSH 0.272*   Anemia Panel: No results for input(s): VITAMINB12, FOLATE, FERRITIN, TIBC, IRON, RETICCTPCT in  the last 72 hours. Sepsis Labs: Recent Labs  Lab 06/16/21 1230 06/16/21 1345 06/17/21 0445  PROCALCITON  --   --  <0.10  LATICACIDVEN 1.7 2.0* 1.3    Recent Results (from the past 240 hour(s))  Resp Panel by RT-PCR (Flu A&B, Covid) Nasopharyngeal Swab     Status: None   Collection Time: 06/16/21 12:21 PM   Specimen: Nasopharyngeal Swab; Nasopharyngeal(NP) swabs in vial transport medium  Result Value Ref Range Status   SARS Coronavirus 2 by RT PCR NEGATIVE NEGATIVE Final    Comment: (NOTE) SARS-CoV-2 target nucleic acids are NOT DETECTED.  The SARS-CoV-2 RNA is generally detectable in upper respiratory specimens during the acute phase of infection. The lowest concentration of SARS-CoV-2 viral copies this assay can detect is 138 copies/mL. A negative result does not preclude SARS-Cov-2 infection and should not be used  as the sole basis for treatment or other patient management decisions. A negative result may occur with  improper specimen collection/handling, submission of specimen other than nasopharyngeal swab, presence of viral mutation(s) within the areas targeted by this assay, and inadequate number of viral copies(<138 copies/mL). A negative result must be combined with clinical observations, patient history, and epidemiological information. The expected result is Negative.  Fact Sheet for Patients:  EntrepreneurPulse.com.au  Fact Sheet for Healthcare Providers:  IncredibleEmployment.be  This test is no t yet approved or cleared by the [place] FDA and  has been authorized for detection and/or diagnosis of SARS-CoV-2 by FDA under an Emergency Use Authorization (EUA). This EUA will remain  in effect (meaning this test can be used) for the duration of the COVID-19 declaration under Section 564(b)(1) of the Act, 21 U.S.C.section 360bbb-3(b)(1), unless the authorization is terminated  or revoked sooner.       Influenza A by PCR  NEGATIVE NEGATIVE Final   Influenza B by PCR NEGATIVE NEGATIVE Final    Comment: (NOTE) The Xpert Xpress SARS-CoV-2/FLU/RSV plus assay is intended as an aid in the diagnosis of influenza from Nasopharyngeal swab specimens and should not be used as a sole basis for treatment. Nasal washings and aspirates are unacceptable for Xpert Xpress SARS-CoV-2/FLU/RSV testing.  Fact Sheet for Patients: EntrepreneurPulse.com.au  Fact Sheet for Healthcare Providers: IncredibleEmployment.be  This test is not yet approved or cleared by the [place] FDA and has been authorized for detection and/or diagnosis of SARS-CoV-2 by FDA under an Emergency Use Authorization (EUA). This EUA will remain in effect (meaning this test can be used) for the duration of the COVID-19 declaration under Section 564(b)(1) of the Act, 21 U.S.C. section 360bbb-3(b)(1), unless the authorization is terminated or revoked.  Performed at [facility], [place] [address], [place], [place] [address]          Radiology Studies: DG Tibia/Fibula Left  Result Date: 06/16/2021 CLINICAL DATA:  Lower extremity edema EXAM: LEFT TIBIA AND FIBULA - 2 VIEW COMPARISON:  None. FINDINGS: There is no evidence of fracture or other focal bone lesions. No erosion or periosteal elevation. Diffusely edematous appearance of the soft tissues. No soft tissue gas. IMPRESSION: Edematous appearance of the soft tissues of the left lower extremity. No soft tissue gas. No radiographic evidence of osteomyelitis. Electronically Signed   By: Davina Poke D.O.   On: 06/16/2021 13:27   DG Tibia/Fibula Right  Result Date: 06/16/2021 CLINICAL DATA:  Fall today with inability to get up. Diffuse weeping edema in both legs with decubitus ulcers. Questionable sepsis. EXAM: RIGHT TIBIA AND FIBULA - 2 VIEW COMPARISON:  None. FINDINGS: The bones appear adequately mineralized. There is no evidence of acute  fracture or dislocation. There is no evidence of bone destruction to suggest osteomyelitis. Advanced degenerative changes are present at the right knee with tricompartmental joint space narrowing and lateral subluxation. There is generalized soft tissue edema without evidence of foreign body or soft tissue emphysema. IMPRESSION: No acute osseous findings or evidence of osteomyelitis. Nonspecific generalized soft tissue edema without foreign body. Advanced right knee osteoarthritis. Electronically Signed   By: Richardean Sale M.D.   On: 06/16/2021 13:28   CT Head Wo Contrast  Result Date: 06/16/2021 CLINICAL DATA:  Status post fall. EXAM: CT HEAD WITHOUT CONTRAST TECHNIQUE: Contiguous axial images were obtained from the base of the skull through the vertex without intravenous contrast. COMPARISON:  None. FINDINGS: Brain: There is mild cerebral atrophy with widening  of the extra-axial spaces and ventricular dilatation. There are areas of decreased attenuation within the white matter tracts of the supratentorial brain, consistent with microvascular disease changes. Vascular: No hyperdense vessel or unexpected calcification. Skull: Normal. Negative for fracture or focal lesion. Sinuses/Orbits: There is marked severity right maxillary sinus mucosal thickening. Mild left maxillary sinus and right ethmoid sinus mucosal thickening is seen. A small left maxillary sinus air-fluid level is also present. Other: None. IMPRESSION: 1. Mild cerebral atrophy. 2. No acute intracranial abnormality. 3. Paranasal sinus disease, as described above. Electronically Signed   By: Virgina Norfolk M.D.   On: 06/16/2021 16:08   CT Cervical Spine Wo Contrast  Result Date: 06/16/2021 CLINICAL DATA:  Status post fall. EXAM: CT CERVICAL SPINE WITHOUT CONTRAST TECHNIQUE: Multidetector CT imaging of the cervical spine was performed without intravenous contrast. Multiplanar CT image reconstructions were also generated. COMPARISON:  None.  FINDINGS: Alignment: Normal. Skull base and vertebrae: No acute fracture. No primary bone lesion or focal pathologic process. Soft tissues and spinal canal: No prevertebral fluid or swelling. No visible canal hematoma. Disc levels: Moderate severity endplate sclerosis and osteophyte formation are seen at the levels of C4-C5, C5-C6 and C6-C7. There is moderate severity narrowing of the anterior atlantoaxial articulation. Mild intervertebral disc space narrowing is seen at C4-C5, C5-C6 and C6-C7. Bilateral mild-to-moderate severity multilevel facet joint hypertrophy is noted. Upper chest: Negative. Other: None. IMPRESSION: 1. Moderate severity degenerative changes of the cervical spine, as described above. 2. No evidence of an acute fracture or subluxation. Electronically Signed   By: Virgina Norfolk M.D.   On: 06/16/2021 16:01   CT THORACIC SPINE WO CONTRAST  Result Date: 06/16/2021 CLINICAL DATA:  Back pain.  Fall. EXAM: CT THORACIC SPINE WITHOUT CONTRAST TECHNIQUE: Multidetector CT images of the thoracic spine were obtained using the standard protocol without intravenous contrast. COMPARISON:  CTA chest 02/28/2021 FINDINGS: Alignment: Normal. Vertebrae: No acute fracture or destructive osseous process in the thoracic spine. Largely bridging vertebral osteophytes throughout the thoracic spine. Paraspinal and other soft tissues: No acute abnormality identified in the paraspinal soft tissues. Aortic and coronary atherosclerosis. Unchanged scarring or atelectasis in the lingula. Minimal dependent atelectasis in the right lower lobe. Disc levels: Spondylosis without evidence of high-grade spinal canal stenosis on this non myelographic examination. Scattered thoracic facet arthrosis including asymmetric right-sided facet spurring and ligamentum flavum calcification on the right at T12-L1 resulting an right lateral recess and medial right neural foraminal stenosis. IMPRESSION: 1. No acute osseous abnormality in the  thoracic spine. 2. Diffuse thoracic spondylosis without evidence of high-grade spinal canal stenosis. 3. Aortic Atherosclerosis (ICD10-I70.0). Electronically Signed   By: Logan Bores M.D.   On: 06/16/2021 16:17   CT ABDOMEN PELVIS W CONTRAST  Result Date: 06/16/2021 CLINICAL DATA:  Status post fall. EXAM: CT ABDOMEN AND PELVIS WITH CONTRAST TECHNIQUE: Multidetector CT imaging of the abdomen and pelvis was performed using the standard protocol following bolus administration of intravenous contrast. CONTRAST:  180m OMNIPAQUE IOHEXOL 350 MG/ML SOLN COMPARISON:  April 04, 2018 FINDINGS: Lower chest: No acute abnormality. Hepatobiliary: No focal liver abnormality is seen. Status post cholecystectomy. No biliary dilatation. Pancreas: Unremarkable. No pancreatic ductal dilatation or surrounding inflammatory changes. Spleen: Normal in size without focal abnormality. Adrenals/Urinary Tract: Adrenal glands are unremarkable. Kidneys are normal, without renal calculi, focal lesion, or hydronephrosis. Bladder is unremarkable. Stomach/Bowel: Stomach is within normal limits. Appendix appears normal. No evidence of bowel wall thickening, distention, or inflammatory changes. Vascular/Lymphatic: Aortic atherosclerosis. No enlarged  abdominal or pelvic lymph nodes. Reproductive: Status post hysterectomy. No adnexal masses. Other: No abdominal wall hernia or abnormality. No abdominopelvic ascites. Musculoskeletal: No acute osseous abnormalities are identified. Multilevel marked severity degenerative changes are seen at the levels of L3-L4, L4-L5 and L5-S1. IMPRESSION: 1. No evidence of acute or active process within the abdomen or pelvis. 2. Evidence of prior cholecystectomy. 3. Aortic atherosclerosis. 4. Degenerative changes within the mid and lower lumbar spine. Aortic Atherosclerosis (ICD10-I70.0). Electronically Signed   By: Virgina Norfolk M.D.   On: 06/16/2021 16:06   CT L-SPINE NO CHARGE  Result Date:  06/16/2021 CLINICAL DATA:  Back pain.  Fall. EXAM: CT LUMBAR SPINE WITH CONTRAST TECHNIQUE: Technique: Multiplanar CT images of the lumbar spine were reconstructed from contemporary CT of the Abdomen and Pelvis. CONTRAST:  No additional COMPARISON:  CT abdomen and pelvis 04/04/2018 FINDINGS: Segmentation: Transitional lumbosacral anatomy with partial lumbarization of S1. Alignment: Mild lumbar levoscoliosis. Straightening of the normal lumbar lordosis. No significant listhesis in the sagittal plane. Vertebrae: No acute fracture or suspicious osseous lesion. Advanced disc space narrowing and prominent degenerative endplate sclerosis and vacuum disc from L3-4 to L5-S1. Bulky anterolateral vertebral osteophytes throughout the lumbar spine greater than right. Paraspinal and other soft tissues: No acute abnormality identified in the paraspinal soft tissues. Asymmetric right psoas muscle atrophy. Intra-abdominal and pelvic contents reported separately. Disc levels: Disc bulging, endplate spurring, and severe facet hypertrophy result in spinal stenosis from L2-3 to L5-S1 which appears severe at L3-4 and L4-5. There is widespread neural foraminal stenosis which appears severe on the right at L3-4, moderate on the left at L4-5, and moderate bilaterally at L5-S1. IMPRESSION: 1. No acute osseous abnormality in the lumbar spine. 2. Advanced lumbar disc and facet degeneration with severe spinal stenosis at L3-4 and L4-5. 3. Widespread neural foraminal stenosis, severe on the right at L3-4. Electronically Signed   By: Logan Bores M.D.   On: 06/16/2021 16:24   DG Chest Port 1 View  Result Date: 06/16/2021 CLINICAL DATA:  Fall EXAM: PORTABLE CHEST 1 VIEW COMPARISON:  None. FINDINGS: The heart size and mediastinal contours are within normal limits. Both lungs are clear. The visualized skeletal structures are unremarkable. IMPRESSION: No acute cardiopulmonary abnormality. Electronically Signed   By: Yetta Glassman M.D.   On:  06/16/2021 13:23   DG Foot Complete Left  Result Date: 06/16/2021 CLINICAL DATA:  Fall today with inability to get up. Diffuse weeping edema in both legs with decubitus ulcers. Questionable sepsis. EXAM: LEFT FOOT - COMPLETE 3+ VIEW COMPARISON:  None. FINDINGS: The bones appear adequately mineralized. No evidence of acute fracture, dislocation or bone destruction. There is smooth cortical thickening of the 2nd and 4th metatarsal shafts. There are advanced degenerative changes of the 1st metatarsophalangeal joint with joint space narrowing and osteophytes. Or prominent dorsal osteophytes are present at the Lisfranc joint. There is mild generalized soft tissue edema without evidence of foreign body or soft tissue emphysema. IMPRESSION: No acute osseous findings or radiographic evidence of osteomyelitis. Nonspecific soft tissue swelling without evidence of foreign body. Electronically Signed   By: Richardean Sale M.D.   On: 06/16/2021 13:27   DG Foot Complete Right  Result Date: 06/16/2021 CLINICAL DATA:  Sepsis. EXAM: RIGHT FOOT COMPLETE - 3+ VIEW COMPARISON:  None. FINDINGS: The joint spaces are maintained. No acute bony findings or destructive bony changes to suggest osteomyelitis. IMPRESSION: No acute bony findings. Electronically Signed   By: Marijo Sanes M.D.   On: 06/16/2021  13:35        Scheduled Meds:  atorvastatin  10 mg Oral Daily   buPROPion  150 mg Oral QPM   doxycycline  100 mg Oral Q12H   enoxaparin (LOVENOX) injection  40 mg Subcutaneous Q24H   fluticasone furoate-vilanterol  1 puff Inhalation Daily   ipratropium-albuterol  3 mL Nebulization Q6H   levothyroxine  150 mcg Oral Q0600   pantoprazole  40 mg Oral Daily   predniSONE  40 mg Oral Q breakfast   venlafaxine XR  150 mg Oral QHS   Continuous Infusions:  cefTRIAXone (ROCEPHIN)  IV Stopped (06/17/21 0224)   lactated ringers 100 mL/hr at 06/17/21 0244     LOS: 1 day    Time spent: 77mn    Annalisse Minkoff C Khanh Tanori,  DO [facility]  If 7PM-7AM, please contact night-coverage [email or web address]  06/17/2021, 8:29 AM

## 2021-06-18 DIAGNOSIS — L0390 Cellulitis, unspecified: Secondary | ICD-10-CM | POA: Diagnosis not present

## 2021-06-18 LAB — CBC
HCT: 32.1 % — ABNORMAL LOW (ref 36.0–46.0)
Hemoglobin: 9.2 g/dL — ABNORMAL LOW (ref 12.0–15.0)
MCH: 24.5 pg — ABNORMAL LOW (ref 26.0–34.0)
MCHC: 28.7 g/dL — ABNORMAL LOW (ref 30.0–36.0)
MCV: 85.6 fL (ref 80.0–100.0)
Platelets: 480 10*3/uL — ABNORMAL HIGH (ref 150–400)
RBC: 3.75 MIL/uL — ABNORMAL LOW (ref 3.87–5.11)
RDW: 21.3 % — ABNORMAL HIGH (ref 11.5–15.5)
WBC: 8.5 10*3/uL (ref 4.0–10.5)
nRBC: 0 % (ref 0.0–0.2)

## 2021-06-18 LAB — BASIC METABOLIC PANEL
Anion gap: 5 (ref 5–15)
BUN: 10 mg/dL (ref 8–23)
CO2: 29 mmol/L (ref 22–32)
Calcium: 7.8 mg/dL — ABNORMAL LOW (ref 8.9–10.3)
Chloride: 100 mmol/L (ref 98–111)
Creatinine, Ser: 0.68 mg/dL (ref 0.44–1.00)
GFR, Estimated: >60 mL/min (ref >60)
Glucose, Bld: 141 mg/dL — ABNORMAL HIGH (ref 70–99)
Potassium: 3.6 mmol/L (ref 3.5–5.1)
Sodium: 134 mmol/L — ABNORMAL LOW (ref 135–145)

## 2021-06-18 LAB — URINE CULTURE: Culture: NO GROWTH

## 2021-06-18 NOTE — Evaluation (Signed)
Physical Therapy Evaluation Patient Details Name: [name] MRN: [id number] DOB: November 04, 1955 Today's Date: 06/18/2021  History of Present Illness  Pt admitted from home s/p fall and dx with acute respiratory failure with hypoxia 2* COPD exacerbation, bil LE cellulitis with multiple wounds, FTT and noted presence of bed bugs.  Pt with hx of COPD and painful R knee ("I need a knee replacement but I'm too old")  Clinical Impression  Pt admitted as above and presenting with functional mobility limitations 2* generalized weakness, balance deficits, bil LE pain (R>L), and poor endurance.  Pt would greatly benefit from follow up rehab at SNF level to maximize IND and safety prior to return home with limited assist.     Recommendations for follow up therapy are one component of a multi-disciplinary discharge planning process, led by the attending physician.  Recommendations may be updated based on patient status, additional functional criteria and insurance authorization.  Follow Up Recommendations Skilled nursing-short term rehab (<3 hours/day)    Assistance Recommended at Discharge Frequent or constant Supervision/Assistance  Functional Status Assessment Patient has had a recent decline in their functional status and demonstrates the ability to make significant improvements in function in a reasonable and predictable amount of time.  Equipment Recommendations  None recommended by PT    Recommendations for Other Services       Precautions / Restrictions Precautions Precautions: Fall;Other (comment) Precaution Comments: bed bugs Restrictions Weight Bearing Restrictions: No      Mobility  Bed Mobility Overal bed mobility: Needs Assistance Bed Mobility: Supine to Sit;Sit to Supine     Supine to sit: Mod assist;+2 for physical assistance;+2 for safety/equipment;Max assist Sit to supine: Mod assist;+2 for physical assistance;+2 for safety/equipment   General bed mobility comments:  Assist to manage LEs and to control trunk.  Use of bed pad to complete rotation to sitting EOB    Transfers Overall transfer level: Needs assistance Equipment used: Rolling walker (2 wheels) Transfers: Sit to/from Stand Sit to Stand: Min assist;+2 physical assistance;+2 safety/equipment           General transfer comment: Pt to semi-erect standing only - unable to bring hips under and bring trunk up to attain upright standing    Ambulation/Gait               General Gait Details: Pt agreeable to standing only - ltd by c/o pain and fatigue  Stairs            Wheelchair Mobility    Modified Rankin (Stroke Patients Only)       Balance Overall balance assessment: Needs assistance Sitting-balance support: No upper extremity supported;Feet supported Sitting balance-Leahy Scale: Fair     Standing balance support: Bilateral upper extremity supported Standing balance-Leahy Scale: Poor                               Pertinent Vitals/Pain Pain Assessment: Faces Faces Pain Scale: Hurts even more Pain Location: R knee with mobilization Pain Descriptors / Indicators: Grimacing;Guarding Pain Intervention(s): Limited activity within patient's tolerance;Monitored during session;Repositioned    Home Living Family/patient expects to be discharged to:: Skilled nursing facility                        Prior Function Prior Level of Function : Needs assist       Physical Assist : Mobility (physical);ADLs (physical)     Mobility Comments: Pt  reports using RW at home for support 2* arthritic R knee; ADLs Comments: Pt reports "a nurse" came to help her 2-3 times a week and assisting with cleaning, getting groceries and with bathing/dressing     Hand Dominance   Dominant Hand: Right    Extremity/Trunk Assessment   Upper Extremity Assessment Upper Extremity Assessment: Defer to OT evaluation    Lower Extremity Assessment Lower Extremity  Assessment: Generalized weakness;RLE deficits/detail RLE: Unable to fully assess due to pain       Communication   Communication: HOH  Cognition Arousal/Alertness: Awake/alert Behavior During Therapy: Flat affect;Anxious Overall Cognitive Status: No family/caregiver present to determine baseline cognitive functioning                                 General Comments: Appears slow to process - ? hearing deficits        General Comments      Exercises     Assessment/Plan    PT Assessment Patient needs continued PT services  PT Problem List Decreased strength;Decreased range of motion;Decreased activity tolerance;Decreased balance;Decreased mobility;Decreased knowledge of use of DME;Decreased safety awareness;Pain       PT Treatment Interventions DME instruction;Gait training;Stair training;Functional mobility training;Therapeutic activities;Therapeutic exercise;Patient/family education;Balance training    PT Goals (Current goals can be found in the Care Plan section)  Acute Rehab PT Goals Patient Stated Goal: get stronger PT Goal Formulation: With patient Time For Goal Achievement: 07/02/21 Potential to Achieve Goals: Fair    Frequency Min 2X/week   Barriers to discharge Decreased caregiver support;Inaccessible home environment Pt lives alone and home has bed bug infestation    Co-evaluation PT/OT/SLP Co-Evaluation/Treatment: Yes Reason for Co-Treatment: For patient/therapist safety;To address functional/ADL transfers PT goals addressed during session: Mobility/safety with mobility OT goals addressed during session: ADL's and self-care       AM-PAC PT "6 Clicks" Mobility  Outcome Measure Help needed turning from your back to your side while in a flat bed without using bedrails?: A Lot Help needed moving from lying on your back to sitting on the side of a flat bed without using bedrails?: A Lot Help needed moving to and from a bed to a chair  (including a wheelchair)?: A Lot Help needed standing up from a chair using your arms (e.g., wheelchair or bedside chair)?: A Lot Help needed to walk in hospital room?: Total Help needed climbing 3-5 steps with a railing? : Total 6 Click Score: 10    End of Session Equipment Utilized During Treatment: Gait belt Activity Tolerance: Patient limited by fatigue;Patient limited by pain Patient left: in bed;with call bell/phone within reach;with bed alarm set Nurse Communication: Mobility status PT Visit Diagnosis: Unsteadiness on feet (R26.81);Muscle weakness (generalized) (M62.81);Difficulty in walking, not elsewhere classified (R26.2);Pain Pain - Right/Left: Right Pain - part of body: Knee    Time: 1457-1530 PT Time Calculation (min) (ACUTE ONLY): 33 min   Charges:   PT Evaluation $PT Eval Low Complexity: 1 Low          [name] PT Acute Rehabilitation Services Pager [phone] Office [phone]   Wynette Jersey 06/18/2021, 4:25 PM

## 2021-06-18 NOTE — Evaluation (Signed)
Occupational Therapy Evaluation Patient Details Name: [name] MRN: [id number] DOB: 1956/02/08 Today's Date: 06/18/2021   History of Present Illness Pt admitted from home s/p fall and dx with acute respiratory failure with hypoxia 2* COPD exacerbation, bil LE cellulitis with multiple wounds, FTT and noted presence of bed bugs.  Pt with hx of COPD and painful R knee ("I need a knee replacement but I'm too old")   Clinical Impression   [name] is a 65 year old woman who presents with generalize weakness, decreased activity tolerance, impaired balance, right arthritic knee pain and lower leg pain and wounds resulting in a decline in functional abilities. Patinet typically lives alone and is able to grossly perform her BADLs though she has an aide that comes and helps three days a week for cleaning, bathing and some dressing. Patient reports she can walk with a walker and go to the bathroom on her own. Currently patient mod x 2 to bed mobility mobility and min assist x 2 to partially stand at side of bed. Patient limtied today due to right knee pain and pain from wound on her lateral thigh. Patient set up for UB ADLs and max-total assist for LB ADLs. Patient will benefit from skilled OT services while in hospital to improve deficits and learn compensatory strategies as needed in order to return to PLOF.  Recommend short term rehab at discharge.     Recommendations for follow up therapy are one component of a multi-disciplinary discharge planning process, led by the attending physician.  Recommendations may be updated based on patient status, additional functional criteria and insurance authorization.   Follow Up Recommendations  Skilled nursing-short term rehab (<3 hours/day)    Assistance Recommended at Discharge Frequent or constant Supervision/Assistance  Functional Status Assessment  Patient has had a recent decline in their functional status and demonstrates the ability to make  significant improvements in function in a reasonable and predictable amount of time.  Equipment Recommendations  None recommended by OT    Recommendations for Other Services       Precautions / Restrictions Precautions Precautions: Fall;Other (comment) Precaution Comments: bed bugs, fleas Restrictions Weight Bearing Restrictions: No      Mobility Bed Mobility Overal bed mobility: Needs Assistance Bed Mobility: Supine to Sit;Sit to Supine     Supine to sit: Mod assist;+2 for physical assistance;+2 for safety/equipment;Max assist Sit to supine: Mod assist;+2 for physical assistance;+2 for safety/equipment   General bed mobility comments: Assist to manage LEs and to control trunk.  Use of bed pad to complete rotation to sitting EOB    Transfers Overall transfer level: Needs assistance Equipment used: Rolling walker (2 wheels) Transfers: Sit to/from Stand Sit to Stand: Min assist;+2 physical assistance;+2 safety/equipment           General transfer comment: Pt to semi-erect standing only - unable to bring hips under and bring trunk up to attain upright standing      Balance Overall balance assessment: Needs assistance Sitting-balance support: No upper extremity supported;Feet supported Sitting balance-Leahy Scale: Fair     Standing balance support: Bilateral upper extremity supported Standing balance-Leahy Scale: Poor                             ADL either performed or assessed with clinical judgement   ADL Overall ADL's : Needs assistance/impaired Eating/Feeding: Set up;Bed level   Grooming: Set up   Upper Body Bathing: Set up  Lower Body Bathing: Maximal assistance;Sit to/from stand   Upper Body Dressing : Set up;Sitting   Lower Body Dressing: Total assistance;Sit to/from stand Lower Body Dressing Details (indicate cue type and reason): to don socks Toilet Transfer: +2 for safety/equipment;With caregiver independent  assisting;BSC/3in1;Stand-pivot   Toileting- Clothing Manipulation and Hygiene: Total assistance;Sit to/from stand       Functional mobility during ADLs: Minimal assistance;Rolling walker (2 wheels);+2 for safety/equipment       Vision   Vision Assessment?: No apparent visual deficits     Perception     Praxis      Pertinent Vitals/Pain Pain Assessment: Faces Faces Pain Scale: Hurts whole lot Pain Location: R thigh wound Pain Descriptors / Indicators: Grimacing;Guarding Pain Intervention(s): Monitored during session;Premedicated before session     Hand Dominance Right   Extremity/Trunk Assessment Upper Extremity Assessment Upper Extremity Assessment: Generalized weakness   Lower Extremity Assessment Lower Extremity Assessment: Defer to PT evaluation RLE: Unable to fully assess due to pain   Cervical / Trunk Assessment Cervical / Trunk Assessment: Kyphotic   Communication Communication Communication: HOH   Cognition Arousal/Alertness: Awake/alert Behavior During Therapy: Flat affect Overall Cognitive Status: Within Functional Limits for tasks assessed                                 General Comments: Appears slow to process - ? hearing deficits     General Comments       Exercises     Shoulder Instructions      Home Living Family/patient expects to be discharged to:: Skilled nursing facility                                        Prior Functioning/Environment Prior Level of Function : Needs assist       Physical Assist : Mobility (physical);ADLs (physical)     Mobility Comments: Pt reports using RW at home for support 2* arthritic R knee; ADLs Comments: Pt reports "a nurse" came to help her 2-3 times a week and assisting with cleaning, getting groceries and with bathing/dressing        OT Problem List: Decreased strength;Decreased activity tolerance;Impaired balance (sitting and/or standing);Decreased knowledge of  use of DME or AE;Pain;Obesity      OT Treatment/Interventions: Self-care/ADL training;Therapeutic exercise;DME and/or AE instruction;Patient/family education;Balance training;Therapeutic activities    OT Goals(Current goals can be found in the care plan section) Acute Rehab OT Goals Patient Stated Goal: to walk to bathroom OT Goal Formulation: With patient Time For Goal Achievement: 07/02/21 Potential to Achieve Goals: Good  OT Frequency: Min 2X/week   Barriers to D/C: Decreased caregiver support          Co-evaluation PT/OT/SLP Co-Evaluation/Treatment: Yes Reason for Co-Treatment: To address functional/ADL transfers;For patient/therapist safety PT goals addressed during session: Mobility/safety with mobility OT goals addressed during session: ADL's and self-care      AM-PAC OT "6 Clicks" Daily Activity     Outcome Measure Help from another person eating meals?: A Little Help from another person taking care of personal grooming?: A Little Help from another person toileting, which includes using toliet, bedpan, or urinal?: Total Help from another person bathing (including washing, rinsing, drying)?: A Lot Help from another person to put on and taking off regular upper body clothing?: A Little Help from another person to put on  and taking off regular lower body clothing?: Total 6 Click Score: 13   End of Session Equipment Utilized During Treatment: Rolling walker (2 wheels);Oxygen Nurse Communication: Mobility status  Activity Tolerance: Patient tolerated treatment well Patient left: in bed;with call bell/phone within reach;with bed alarm set  OT Visit Diagnosis: Unsteadiness on feet (R26.81);Muscle weakness (generalized) (M62.81);Pain                Time: 2423-5361 OT Time Calculation (min): 33 min Charges:  OT General Charges $OT Visit: 1 Visit OT Evaluation $OT Eval Moderate Complexity: 1 Mod  Monie Shere, OTR/L Acute Care Rehab Services  Office [phone] Pager:  [phone]   Kelli Churn 06/18/2021, 5:04 PM

## 2021-06-18 NOTE — Progress Notes (Signed)
PROGRESS NOTE    VERSA CRATON  JEH:[id number] DOB: 11/03/1955 DOA: 06/16/2021 PCP: Crist Infante, MD   Brief Narrative:  [name] is a 65 y.o. female with medical history significant of COPD on 2 L nasal cannula, depression/anxiety, GERD, hypothyroidism, chronic lower extremity edema/wounds, hyperlipidemia who presented to [facility] ED on 11/15 via EMS after fall at home.  Patient was found by neighbors in poor state of health, had fallen and had been down for roughly 30 minutes prior to being seen by her neighbors.  Patient reports progressive weakness over the past week, utilizes a walker at baseline.  Also with worsening lower extremity edema and wounds; follows with orthopedics, Dr. Sharol Given outpatient.  Denies any recent medication changes, no sick contacts.  EMS reported home to be infested by bedbugs/fleas and called Adult Protective Services due to the state of her house.  Assessment & Plan:   Sepsis secondary to bilateral lower extremity cellulitis, POA Rule out bug bites/infestation - Noted leukocytosis tachycardia and infection with elevated lactic acidosis/noted bug bites at skin folds and groin. - Patient indicates home nursing is present daily rather than twice weekly as previously mentioned by the patient, unclear which is accurate, also follows on occasion with orthopedics Dr. Sharol Given  - Imaging unremarkable for osteomyelitis - ESR and CRP moderately elevated at 90 and 21 respectively - Continue IV antibiotics including ceftriaxone -likely transition to p.o. antibiotics pending further evaluation and cultures - Patient completed [place] in the ED at intake -unclear whether or not patient still has active bedbugs -continue to bathe daily, linen change frequently and attempts to remove infestation  Acute on chronic respiratory failure with hypoxia secondary to COPD exacerbation - Hypoxia able to take sats in the 80s per report, baseline reported to be room air at intake, patient indicates  she is on 2 L "as needed" - Continue to wean oxygen, continue doxycycline for 5 days - Continue home nebs, hold off on steroids given lower extremity infection   Mechanical fall, ambulatory dysfunction, failure to thrive in the adult, POA - Patient uses a walker at baseline, unable to stand after fall prior to admission, down for 30 minutes - PT OT to continue to follow -patient hopeful for discharge home with home health and therapy given her support at home with nursing -APS contacted at intake, patient alert oriented x4   Stage II sacral decubitus ulcer, POA -- Wound care consulted and following   Depression/anxiety - Wellbutrin 150 mg p.o. daily - Venlafaxine 150 mg p.o. daily - Alprazolam 1 mg p.o. 3 times daily as needed anxiety   Hypothyroidism --TSH --Levothyroxine 150 mcg p.o. daily   HLD: Atorvastatin 10 mg p.o. daily   GERD: Continue PPI    DVT prophylaxis: Lovenox Code Status: Full code Family Communication: None present  Status is: Inpatient  Dispo: The patient is from: Home              Anticipated d/c is to: To be determined              Anticipated d/c date is: 24-48              Patient currently not medically stable for discharge  Consultants:  Wound care  Procedures:  None  Antimicrobials:  Ceftriaxone, doxycycline 06/16/2021 ongoing  Subjective: No acute issues or events overnight -indicates her legs appear the way they have for weeks if not months, respiratory status appears to be improving, awaiting PT evaluation, she is excited for  therapy as she is focused on discharging home  Objective: Vitals:   06/17/21 1634 06/17/21 2212 06/18/21 0140 06/18/21 0509  BP: (!) 102/43 102/60 (!) 103/58 (!) 94/58  Pulse: 97 95 90 92  Resp: _0 Temp: 99.6 F (37.6 C) 99.9 F (37.7 C) 98.9 F (37.2 C) 98.9 F (37.2 C)  TempSrc:  Oral Oral Oral  SpO2: 92% 96% 94% 95%  Weight:      Height:        Intake/Output Summary (Last 24 hours) at  06/18/2021 0723 Last data filed at 06/18/2021 0600 Gross per 24 hour  Intake 2652.3 ml  Output 0 ml  Net 2652.3 ml    Filed Weights   06/16/21 1145 06/17/21 1447  Weight: 90.2 kg 37.2 kg    Examination:   General:  Pleasantly resting in bed, No acute distress. HEENT:  Normocephalic atraumatic.  Sclerae nonicteric, noninjected.  Extraocular movements intact bilaterally. Neck:  Without mass or deformity.  Trachea is midline. Lungs: Diminished bilaterally, end expiratory wheeze without overt rhonchi or rales Heart:  Regular rate and rhythm.  Without murmurs, rubs, or gallops. Abdomen:  Soft, nontender, nondistended.  Without guarding or rebound. Extremities: Without cyanosis, clubbing, 2+ edema Vascular:  Dorsalis pedis and posterior tibial pulses palpable bilaterally. Skin: Sacral decubitus ulcer as above, bilateral lower extremity erythema and wounds as noted on imaging Psych: Alert and oriented x3        Data Reviewed: I have personally reviewed following labs and imaging studies  CBC: Recent Labs  Lab 06/16/21 1254 06/16/21 1255 06/17/21 0445 06/18/21 0400  WBC  --  13.1* 10.0 8.5  NEUTROABS  --  10.5*  --   --   HGB 12.6 10.4* 9.3* 9.2*  HCT 37.0 36.2 32.3* 32.1*  MCV  --  83.6 83.5 85.6  PLT  --  561* 475* 480*    Basic Metabolic Panel: Recent Labs  Lab 06/16/21 1230 06/16/21 1254 06/17/21 0445 06/18/21 0400  NA 136 140 138 134*  K 3.5 3.5 3.0* 3.6  CL 98 97* 101 100  CO2 32  --  33* 29  GLUCOSE 101* 102* 91 141*  BUN _1 CREATININE 0.75 0.70 0.68 0.68  CALCIUM 8.3*  --  8.0* 7.8*  MG  --   --  2.0  --     GFR: Estimated Creatinine Clearance: 41.7 mL/min (by C-G formula based on SCr of 0.68 mg/dL). Liver Function Tests: Recent Labs  Lab 06/16/21 1230 06/17/21 0445  AST 22 19  ALT 16 15  ALKPHOS 119 107  BILITOT 0.6 0.7  PROT 7.8 6.5  ALBUMIN 2.1* 1.9*    Recent Labs  Lab 06/16/21 1230  LIPASE 25    No results for  input(s): AMMONIA in the last 168 hours. Coagulation Profile: Recent Labs  Lab 06/16/21 1255  INR 1.3*    Cardiac Enzymes: No results for input(s): CKTOTAL, CKMB, CKMBINDEX, TROPONINI in the last 168 hours. BNP (last 3 results) No results for input(s): PROBNP in the last 8760 hours. HbA1C: No results for input(s): HGBA1C in the last 72 hours. CBG: No results for input(s): GLUCAP in the last 168 hours. Lipid Profile: No results for input(s): CHOL, HDL, LDLCALC, TRIG, CHOLHDL, LDLDIRECT in the last 72 hours. Thyroid Function Tests: Recent Labs    06/17/21 0526  TSH 0.272*    Anemia Panel: No results for input(s): VITAMINB12, FOLATE, FERRITIN, TIBC, IRON, RETICCTPCT in the last 72 hours. Sepsis  Labs: Recent Labs  Lab 06/16/21 1230 06/16/21 1345 06/17/21 0445  PROCALCITON  --   --  <0.10  LATICACIDVEN 1.7 2.0* 1.3     Recent Results (from the past 240 hour(s))  Resp Panel by RT-PCR (Flu A&B, Covid) Nasopharyngeal Swab     Status: None   Collection Time: 06/16/21 12:21 PM   Specimen: Nasopharyngeal Swab; Nasopharyngeal(NP) swabs in vial transport medium  Result Value Ref Range Status   SARS Coronavirus 2 by RT PCR NEGATIVE NEGATIVE Final    Comment: (NOTE) SARS-CoV-2 target nucleic acids are NOT DETECTED.  The SARS-CoV-2 RNA is generally detectable in upper respiratory specimens during the acute phase of infection. The lowest concentration of SARS-CoV-2 viral copies this assay can detect is 138 copies/mL. A negative result does not preclude SARS-Cov-2 infection and should not be used as the sole basis for treatment or other patient management decisions. A negative result may occur with  improper specimen collection/handling, submission of specimen other than nasopharyngeal swab, presence of viral mutation(s) within the areas targeted by this assay, and inadequate number of viral copies(<138 copies/mL). A negative result must be combined with clinical observations,  patient history, and epidemiological information. The expected result is Negative.  Fact Sheet for Patients:  EntrepreneurPulse.com.au  Fact Sheet for Healthcare Providers:  IncredibleEmployment.be  This test is no t yet approved or cleared by the [place] FDA and  has been authorized for detection and/or diagnosis of SARS-CoV-2 by FDA under an Emergency Use Authorization (EUA). This EUA will remain  in effect (meaning this test can be used) for the duration of the COVID-19 declaration under Section 564(b)(1) of the Act, 21 U.S.C.section 360bbb-3(b)(1), unless the authorization is terminated  or revoked sooner.       Influenza A by PCR NEGATIVE NEGATIVE Final   Influenza B by PCR NEGATIVE NEGATIVE Final    Comment: (NOTE) The Xpert Xpress SARS-CoV-2/FLU/RSV plus assay is intended as an aid in the diagnosis of influenza from Nasopharyngeal swab specimens and should not be used as a sole basis for treatment. Nasal washings and aspirates are unacceptable for Xpert Xpress SARS-CoV-2/FLU/RSV testing.  Fact Sheet for Patients: EntrepreneurPulse.com.au  Fact Sheet for Healthcare Providers: IncredibleEmployment.be  This test is not yet approved or cleared by the [place] FDA and has been authorized for detection and/or diagnosis of SARS-CoV-2 by FDA under an Emergency Use Authorization (EUA). This EUA will remain in effect (meaning this test can be used) for the duration of the COVID-19 declaration under Section 564(b)(1) of the Act, 21 U.S.C. section 360bbb-3(b)(1), unless the authorization is terminated or revoked.  Performed at [facility], [place] [address] [place], [place] [address]   Blood Culture (routine x 2)     Status: None (Preliminary result)   Collection Time: 06/16/21 12:30 PM   Specimen: BLOOD  Result Value Ref Range Status   Specimen Description   Final    BLOOD  RIGHT ANTECUBITAL Performed at [facility] [address] [place], [place] [address]    Special Requests   Final    BOTTLES DRAWN AEROBIC ONLY Blood Culture adequate volume Performed at [facility] [address] [place], [place] [address]    Culture   Final    NO GROWTH < 24 HOURS Performed at [facility] [address] [place], [place] [address]    Report Status PENDING  Incomplete  Blood Culture (routine x 2)     Status: None (Preliminary  result)   Collection Time: 06/16/21 12:30 PM   Specimen: BLOOD  Result Value Ref Range Status   Specimen Description   Final    BLOOD LEFT ARM Performed at [facility] [address], [place], [place] [address]    Special Requests   Final    BOTTLES DRAWN AEROBIC ONLY Blood Culture results may not be optimal due to an inadequate volume of blood received in culture bottles Performed at [facility] [address], [place], [place] [address]    Culture   Final    NO GROWTH < 24 HOURS Performed at [facility] [address] [place], [place] [address]    Report Status PENDING  Incomplete          Radiology Studies: DG Tibia/Fibula Left  Result Date: 06/16/2021 CLINICAL DATA:  Lower extremity edema EXAM: LEFT TIBIA AND FIBULA - 2 VIEW COMPARISON:  None. FINDINGS: There is no evidence of fracture or other focal bone lesions. No erosion or periosteal elevation. Diffusely edematous appearance of the soft tissues. No soft tissue gas. IMPRESSION: Edematous appearance of the soft tissues of the left lower extremity. No soft tissue gas. No radiographic evidence of osteomyelitis. Electronically Signed   By: Davina Poke D.O.   On: 06/16/2021 13:27   DG Tibia/Fibula Right  Result Date: 06/16/2021 CLINICAL DATA:  Fall today with inability to get up. Diffuse weeping edema in both legs with decubitus ulcers. Questionable sepsis. EXAM: RIGHT TIBIA  AND FIBULA - 2 VIEW COMPARISON:  None. FINDINGS: The bones appear adequately mineralized. There is no evidence of acute fracture or dislocation. There is no evidence of bone destruction to suggest osteomyelitis. Advanced degenerative changes are present at the right knee with tricompartmental joint space narrowing and lateral subluxation. There is generalized soft tissue edema without evidence of foreign body or soft tissue emphysema. IMPRESSION: No acute osseous findings or evidence of osteomyelitis. Nonspecific generalized soft tissue edema without foreign body. Advanced right knee osteoarthritis. Electronically Signed   By: Richardean Sale M.D.   On: 06/16/2021 13:28   CT Head Wo Contrast  Result Date: 06/16/2021 CLINICAL DATA:  Status post fall. EXAM: CT HEAD WITHOUT CONTRAST TECHNIQUE: Contiguous axial images were obtained from the base of the skull through the vertex without intravenous contrast. COMPARISON:  None. FINDINGS: Brain: There is mild cerebral atrophy with widening of the extra-axial spaces and ventricular dilatation. There are areas of decreased attenuation within the white matter tracts of the supratentorial brain, consistent with microvascular disease changes. Vascular: No hyperdense vessel or unexpected calcification. Skull: Normal. Negative for fracture or focal lesion. Sinuses/Orbits: There is marked severity right maxillary sinus mucosal thickening. Mild left maxillary sinus and right ethmoid sinus mucosal thickening is seen. A small left maxillary sinus air-fluid level is also present. Other: None. IMPRESSION: 1. Mild cerebral atrophy. 2. No acute intracranial abnormality. 3. Paranasal sinus disease, as described above. Electronically Signed   By: Virgina Norfolk M.D.   On: 06/16/2021 16:08   CT Cervical Spine Wo Contrast  Result Date: 06/16/2021 CLINICAL DATA:  Status post fall. EXAM: CT CERVICAL SPINE WITHOUT CONTRAST TECHNIQUE: Multidetector CT imaging of the cervical spine  was performed without intravenous contrast. Multiplanar CT image reconstructions were also generated. COMPARISON:  None. FINDINGS: Alignment: Normal. Skull base and vertebrae: No acute fracture. No primary bone lesion or focal pathologic process. Soft tissues and spinal canal: No prevertebral fluid or swelling. No visible canal hematoma. Disc levels: Moderate severity endplate  sclerosis and osteophyte formation are seen at the levels of C4-C5, C5-C6 and C6-C7. There is moderate severity narrowing of the anterior atlantoaxial articulation. Mild intervertebral disc space narrowing is seen at C4-C5, C5-C6 and C6-C7. Bilateral mild-to-moderate severity multilevel facet joint hypertrophy is noted. Upper chest: Negative. Other: None. IMPRESSION: 1. Moderate severity degenerative changes of the cervical spine, as described above. 2. No evidence of an acute fracture or subluxation. Electronically Signed   By: Virgina Norfolk M.D.   On: 06/16/2021 16:01   CT THORACIC SPINE WO CONTRAST  Result Date: 06/16/2021 CLINICAL DATA:  Back pain.  Fall. EXAM: CT THORACIC SPINE WITHOUT CONTRAST TECHNIQUE: Multidetector CT images of the thoracic spine were obtained using the standard protocol without intravenous contrast. COMPARISON:  CTA chest 02/28/2021 FINDINGS: Alignment: Normal. Vertebrae: No acute fracture or destructive osseous process in the thoracic spine. Largely bridging vertebral osteophytes throughout the thoracic spine. Paraspinal and other soft tissues: No acute abnormality identified in the paraspinal soft tissues. Aortic and coronary atherosclerosis. Unchanged scarring or atelectasis in the lingula. Minimal dependent atelectasis in the right lower lobe. Disc levels: Spondylosis without evidence of high-grade spinal canal stenosis on this non myelographic examination. Scattered thoracic facet arthrosis including asymmetric right-sided facet spurring and ligamentum flavum calcification on the right at T12-L1  resulting an right lateral recess and medial right neural foraminal stenosis. IMPRESSION: 1. No acute osseous abnormality in the thoracic spine. 2. Diffuse thoracic spondylosis without evidence of high-grade spinal canal stenosis. 3. Aortic Atherosclerosis (ICD10-I70.0). Electronically Signed   By: Logan Bores M.D.   On: 06/16/2021 16:17   CT ABDOMEN PELVIS W CONTRAST  Result Date: 06/16/2021 CLINICAL DATA:  Status post fall. EXAM: CT ABDOMEN AND PELVIS WITH CONTRAST TECHNIQUE: Multidetector CT imaging of the abdomen and pelvis was performed using the standard protocol following bolus administration of intravenous contrast. CONTRAST:  1103m OMNIPAQUE IOHEXOL 350 MG/ML SOLN COMPARISON:  April 04, 2018 FINDINGS: Lower chest: No acute abnormality. Hepatobiliary: No focal liver abnormality is seen. Status post cholecystectomy. No biliary dilatation. Pancreas: Unremarkable. No pancreatic ductal dilatation or surrounding inflammatory changes. Spleen: Normal in size without focal abnormality. Adrenals/Urinary Tract: Adrenal glands are unremarkable. Kidneys are normal, without renal calculi, focal lesion, or hydronephrosis. Bladder is unremarkable. Stomach/Bowel: Stomach is within normal limits. Appendix appears normal. No evidence of bowel wall thickening, distention, or inflammatory changes. Vascular/Lymphatic: Aortic atherosclerosis. No enlarged abdominal or pelvic lymph nodes. Reproductive: Status post hysterectomy. No adnexal masses. Other: No abdominal wall hernia or abnormality. No abdominopelvic ascites. Musculoskeletal: No acute osseous abnormalities are identified. Multilevel marked severity degenerative changes are seen at the levels of L3-L4, L4-L5 and L5-S1. IMPRESSION: 1. No evidence of acute or active process within the abdomen or pelvis. 2. Evidence of prior cholecystectomy. 3. Aortic atherosclerosis. 4. Degenerative changes within the mid and lower lumbar spine. Aortic Atherosclerosis  (ICD10-I70.0). Electronically Signed   By: TVirgina NorfolkM.D.   On: 06/16/2021 16:06   CT L-SPINE NO CHARGE  Result Date: 06/16/2021 CLINICAL DATA:  Back pain.  Fall. EXAM: CT LUMBAR SPINE WITH CONTRAST TECHNIQUE: Technique: Multiplanar CT images of the lumbar spine were reconstructed from contemporary CT of the Abdomen and Pelvis. CONTRAST:  No additional COMPARISON:  CT abdomen and pelvis 04/04/2018 FINDINGS: Segmentation: Transitional lumbosacral anatomy with partial lumbarization of S1. Alignment: Mild lumbar levoscoliosis. Straightening of the normal lumbar lordosis. No significant listhesis in the sagittal plane. Vertebrae: No acute fracture or suspicious osseous lesion. Advanced disc space narrowing and prominent degenerative endplate sclerosis  and vacuum disc from L3-4 to L5-S1. Bulky anterolateral vertebral osteophytes throughout the lumbar spine greater than right. Paraspinal and other soft tissues: No acute abnormality identified in the paraspinal soft tissues. Asymmetric right psoas muscle atrophy. Intra-abdominal and pelvic contents reported separately. Disc levels: Disc bulging, endplate spurring, and severe facet hypertrophy result in spinal stenosis from L2-3 to L5-S1 which appears severe at L3-4 and L4-5. There is widespread neural foraminal stenosis which appears severe on the right at L3-4, moderate on the left at L4-5, and moderate bilaterally at L5-S1. IMPRESSION: 1. No acute osseous abnormality in the lumbar spine. 2. Advanced lumbar disc and facet degeneration with severe spinal stenosis at L3-4 and L4-5. 3. Widespread neural foraminal stenosis, severe on the right at L3-4. Electronically Signed   By: Logan Bores M.D.   On: 06/16/2021 16:24   DG Chest Port 1 View  Result Date: 06/16/2021 CLINICAL DATA:  Fall EXAM: PORTABLE CHEST 1 VIEW COMPARISON:  None. FINDINGS: The heart size and mediastinal contours are within normal limits. Both lungs are clear. The visualized skeletal  structures are unremarkable. IMPRESSION: No acute cardiopulmonary abnormality. Electronically Signed   By: Yetta Glassman M.D.   On: 06/16/2021 13:23   DG Foot Complete Left  Result Date: 06/16/2021 CLINICAL DATA:  Fall today with inability to get up. Diffuse weeping edema in both legs with decubitus ulcers. Questionable sepsis. EXAM: LEFT FOOT - COMPLETE 3+ VIEW COMPARISON:  None. FINDINGS: The bones appear adequately mineralized. No evidence of acute fracture, dislocation or bone destruction. There is smooth cortical thickening of the 2nd and 4th metatarsal shafts. There are advanced degenerative changes of the 1st metatarsophalangeal joint with joint space narrowing and osteophytes. Or prominent dorsal osteophytes are present at the Lisfranc joint. There is mild generalized soft tissue edema without evidence of foreign body or soft tissue emphysema. IMPRESSION: No acute osseous findings or radiographic evidence of osteomyelitis. Nonspecific soft tissue swelling without evidence of foreign body. Electronically Signed   By: Richardean Sale M.D.   On: 06/16/2021 13:27   DG Foot Complete Right  Result Date: 06/16/2021 CLINICAL DATA:  Sepsis. EXAM: RIGHT FOOT COMPLETE - 3+ VIEW COMPARISON:  None. FINDINGS: The joint spaces are maintained. No acute bony findings or destructive bony changes to suggest osteomyelitis. IMPRESSION: No acute bony findings. Electronically Signed   By: Marijo Sanes M.D.   On: 06/16/2021 13:35        Scheduled Meds:  atorvastatin  10 mg Oral Daily   buPROPion  150 mg Oral QPM   doxycycline  100 mg Oral Q12H   enoxaparin (LOVENOX) injection  40 mg Subcutaneous Q24H   fluticasone furoate-vilanterol  1 puff Inhalation Daily   levothyroxine  150 mcg Oral Q0600   liver oil-zinc oxide   Topical QID   pantoprazole  40 mg Oral Daily   predniSONE  40 mg Oral Q breakfast   venlafaxine XR  150 mg Oral QHS   Continuous Infusions:  cefTRIAXone (ROCEPHIN)  IV 1 g (06/18/21  0115)     LOS: 2 days    Time spent: 78mn    Jema Deegan C Aydrien Froman, DO [facility]  If 7PM-7AM, please contact night-coverage [email or web address]  06/18/2021, 7:23 AM

## 2021-06-18 NOTE — Plan of Care (Signed)

## 2021-06-19 DIAGNOSIS — L0390 Cellulitis, unspecified: Secondary | ICD-10-CM | POA: Diagnosis not present

## 2021-06-19 LAB — CBC
HCT: 28.2 % — ABNORMAL LOW (ref 36.0–46.0)
Hemoglobin: 8.1 g/dL — ABNORMAL LOW (ref 12.0–15.0)
MCH: 24.3 pg — ABNORMAL LOW (ref 26.0–34.0)
MCHC: 28.7 g/dL — ABNORMAL LOW (ref 30.0–36.0)
MCV: 84.4 fL (ref 80.0–100.0)
Platelets: 404 10*3/uL — ABNORMAL HIGH (ref 150–400)
RBC: 3.34 MIL/uL — ABNORMAL LOW (ref 3.87–5.11)
RDW: 21 % — ABNORMAL HIGH (ref 11.5–15.5)
WBC: 6.6 10*3/uL (ref 4.0–10.5)
nRBC: 0 % (ref 0.0–0.2)

## 2021-06-19 LAB — BASIC METABOLIC PANEL
Anion gap: 3 — ABNORMAL LOW (ref 5–15)
BUN: 13 mg/dL (ref 8–23)
CO2: 33 mmol/L — ABNORMAL HIGH (ref 22–32)
Calcium: 8 mg/dL — ABNORMAL LOW (ref 8.9–10.3)
Chloride: 101 mmol/L (ref 98–111)
Creatinine, Ser: 0.63 mg/dL (ref 0.44–1.00)
GFR, Estimated: >60 mL/min (ref >60)
Glucose, Bld: 124 mg/dL — ABNORMAL HIGH (ref 70–99)
Potassium: 3.7 mmol/L (ref 3.5–5.1)
Sodium: 137 mmol/L (ref 135–145)

## 2021-06-19 NOTE — TOC Initial Note (Addendum)
Transition of Care [facility]) - Initial/Assessment Note   Patient Details  Name: [name] MRN: [id number] Date of Birth: 07-15-1956  Transition of Care [facility]) CM/SW Contact:    Ewing Schlein, LCSW Phone Number: 06/19/2021, 1:22 PM  Clinical Narrative: Patient is a 65 year old female who was admitted for wound cellulitis. PT and OT evaluations recommended SNF. CSW spoke with patient regarding the recommendations. Per patient, she cannot sign over her monthly check so she is declining SNF at this time. Patient also reported a Child psychotherapist from DSS in [place] will be coming to meet with her in the hospital today. TOC anticipating patient to discharge home with [facility] unless patient agrees to SNF and to signing over her check.  Addendum: CSW notified patient's DSS social worker, Salvadore Farber [phone]), was done assessing the patient and was requesting to speak with CSW. Per Ms. Roseanne Reno, patient told her she has been told by her cousins, Reita Cliche and Cecil Cranker [phone]), that she can live with them rather than returning to her current residence. Ms. Roseanne Reno reported she will follow up with the [facility] to see if the patient can in fact live with them as this will be a better environment.  Expected Discharge Plan: Home w Home Health Services Barriers to Discharge: Continued Medical Work up  Patient Goals and CMS Choice Patient states their goals for this hospitalization and ongoing recovery are:: Discharge home CMS Medicare.gov Compare Post Acute Care list provided to:: Patient Choice offered to / list presented to : Patient  Expected Discharge Plan and Services Expected Discharge Plan: Home w Home Health Services In-house Referral: Clinical Social Work Post Acute Care Choice: Home Health Living arrangements for the past 2 months: Mobile Home             DME Arranged: N/A DME Agency: NA  Prior Living Arrangements/Services Living arrangements for the past 2 months: Mobile  Home Patient language and need for interpreter reviewed:: Yes Do you feel safe going back to the place where you live?: Yes      Need for Family Participation in Patient Care: Yes (Comment) Care giver support system in place?: Yes (comment) Criminal Activity/Legal Involvement Pertinent to Current Situation/Hospitalization: No - Comment as needed  Activities of Daily Living Home Assistive Devices/Equipment: Nebulizer, Oxygen, Walker (specify type), Shower chair with back (front wheeled walker, 4 wheeled walker) ADL Screening (condition at time of admission) Patient's cognitive ability adequate to safely complete daily activities?: Yes Is the patient deaf or have difficulty hearing?: No Does the patient have difficulty seeing, even when wearing glasses/contacts?: No Does the patient have difficulty concentrating, remembering, or making decisions?: No Patient able to express need for assistance with ADLs?: Yes Does the patient have difficulty dressing or bathing?: Yes (worsening weakness) Independently performs ADLs?: No (worsening weakness) Communication: Independent Dressing (OT): Needs assistance Is this a change from baseline?: Change from baseline, expected to last >3 days Grooming: Needs assistance Is this a change from baseline?: Change from baseline, expected to last >3 days Feeding: Needs assistance Is this a change from baseline?: Change from baseline, expected to last >3 days Bathing: Needs assistance Is this a change from baseline?: Change from baseline, expected to last >3 days Toileting: Dependent Is this a change from baseline?: Change from baseline, expected to last >3days In/Out Bed: Dependent Is this a change from baseline?: Change from baseline, expected to last >3 days Walks in Home: Dependent Is this a change from baseline?: Change from baseline, expected  to last >3 days Does the patient have difficulty walking or climbing stairs?: Yes (worsening weakness) Weakness  of Legs: Both Weakness of Arms/Hands: None  Permission Sought/Granted Permission sought to share information with : Other (comment) Permission granted to share information with : Yes, Verbal Permission Granted Permission granted to share info w AGENCY: [place] agencies  Emotional Assessment Attitude/Demeanor/Rapport: Engaged Affect (typically observed): Accepting Orientation: : Oriented to Self, Oriented to Place, Oriented to  Time, Oriented to Situation Alcohol / Substance Use: Tobacco Use  Admission diagnosis:  Back pain [M54.9] Failure to thrive in adult [R62.7] Wound cellulitis [L03.90] Sepsis ([place]) [A41.9] Pressure injury of buttock, stage 2, unspecified laterality ([place]) [L89.302] Patient Active Problem List   Diagnosis Date Noted   Wound cellulitis 06/16/2021   Hypothyroidism 06/16/2021   HLD (hyperlipidemia) 06/16/2021   Anxiety 06/16/2021   Fall at home, initial encounter 06/16/2021   Adult failure to thrive 06/16/2021   Arm mass, right 03/04/2021   Acute respiratory failure with hypoxia ([place]) 03/04/2021   COPD exacerbation ([place]) 03/01/2021   Acute exacerbation of chronic obstructive pulmonary disease (COPD) ([place]) 02/28/2021   Pyelonephritis 04/04/2018   Depression 04/04/2018   Venous insufficiency (chronic) (peripheral) 01/11/2018   BRONCHITIS, ACUTE 10/21/2008   Essential hypertension 07/13/2007   Asthma 07/11/2007   GERD (gastroesophageal reflux disease) 07/11/2007   SLEEP APNEA 07/11/2007   Shortness of breath 07/11/2007   COUGH 07/11/2007   PCP:  Crist Infante, MD Pharmacy:   [place], [place] [address] [place] [address] Phone: [phone] Fax: [place] [id number] - [place], [place] [address] [place] [address] Phone: [phone] Fax: [phone]  Readmission Risk Interventions No flowsheet data found.

## 2021-06-19 NOTE — Progress Notes (Signed)
PROGRESS NOTE    [name]  KXF:[id number] DOB: 06-Aug-1955 DOA: 06/16/2021 PCP: Crist Infante, MD   Brief Narrative:  [name] is a 65 y.o. female with medical history significant of COPD on 2 L nasal cannula, depression/anxiety, GERD, hypothyroidism, chronic lower extremity edema/wounds, hyperlipidemia who presented to [facility] ED on 11/15 via EMS after fall at home.  Patient was found by neighbors in poor state of health, had fallen and had been down for roughly 30 minutes prior to being seen by her neighbors.  Patient reports progressive weakness over the past week, utilizes a walker at baseline.  Also with worsening lower extremity edema and wounds; follows with orthopedics, Dr. Sharol Given outpatient.  Denies any recent medication changes, no sick contacts.  EMS reported home to be infested by bedbugs/fleas and called Adult Protective Services due to the state of her house.  Assessment & Plan:   Sepsis secondary to bilateral lower extremity cellulitis, POA Rule out bug bites/infestation - Noted leukocytosis tachycardia and infection with elevated lactic acidosis/noted bug bites at skin folds and groin. - Imaging unremarkable for osteomyelitis - ESR and CRP moderately elevated at 90 and 21 respectively - Continue IV antibiotics including ceftriaxone - likely transition to p.o. antibiotics pending further evaluation and cultures - Patient completed [place] in the ED at intake - unclear whether or not patient still has active bedbugs (none seen during evaluation today) - continue to bathe daily, linen change frequently in attempts to limit infestation  Acute on chronic respiratory failure with hypoxia secondary to COPD exacerbation, resolving - Hypoxia - sats in the 80s per report at intake, baseline reported to be room air at intake, patient indicates she is on 2 L "as needed" which appears to be essentially around-the-clock - Continue to wean oxygen, continue doxycycline for 5 days - Continue  home nebs, hold off on steroids given lower extremity infection as above   Mechanical fall, ambulatory dysfunction, failure to thrive in the adult, POA - Patient uses a walker at baseline, unable to stand after fall prior to admission, down for 30 minutes - PT OT to continue to follow -currently recommending SNF placement, patient requesting reevaluation for possible discharge home with home therapy -APS contacted at intake, patient alert oriented x4   Stage II sacral decubitus ulcer, POA -- Wound care consulted and following   Depression/anxiety - Wellbutrin 150 mg p.o. daily - Venlafaxine 150 mg p.o. daily - Alprazolam 1 mg p.o. 3 times daily as needed anxiety   Hypothyroidism --TSH --Levothyroxine 150 mcg p.o. daily   HLD: Atorvastatin 10 mg p.o. daily   GERD: Continue PPI    DVT prophylaxis: Lovenox Code Status: Full code Family Communication: None present -  left message for primary contact Cornelia Copa over phone  Status is: Inpatient  Dispo: The patient is from: Home              Anticipated d/c is to: To be determined likely SNF given PT recommendations              Anticipated d/c date is: 24-48              Patient currently not medically stable for discharge  Consultants:  Wound care  Procedures:  None  Antimicrobials:  Ceftriaxone, doxycycline 06/16/2021 ongoing  Subjective: No acute issues or events overnight -denies nausea vomiting diarrhea constipation headache fevers chills or chest pain.  Shortness of breath and leg swelling appear to be improving per the patient  Objective: Vitals:  06/18/21 2052 06/18/21 2155 06/18/21 2258 06/19/21 0543  BP: (!) 106/52 (!) 87/54 93/62 106/70  Pulse: 96 87 94 (!) 107  Resp: 19 17    Temp:  98.1 F (36.7 C)  98 F (36.7 C)  TempSrc:  Oral  Oral  SpO2:  96%  93%  Weight:      Height:        Intake/Output Summary (Last 24 hours) at 06/19/2021 0723 Last data filed at 06/19/2021 0600 Gross per 24 hour  Intake  580 ml  Output 550 ml  Net 30 ml    Filed Weights   06/16/21 1145 06/17/21 1447  Weight: 90.2 kg 37.2 kg    Examination:   General:  Pleasantly resting in bed, No acute distress. HEENT:  Normocephalic atraumatic.  Sclerae nonicteric, noninjected.  Extraocular movements intact bilaterally. Neck:  Without mass or deformity.  Trachea is midline. Lungs: Diminished bilaterally, end expiratory wheeze without overt rhonchi or rales Heart:  Regular rate and rhythm.  Without murmurs, rubs, or gallops. Abdomen:  Soft, nontender, nondistended.  Without guarding or rebound. Extremities: Without cyanosis, clubbing, 2+ edema Vascular:  Dorsalis pedis and posterior tibial pulses palpable bilaterally. Skin: Sacral decubitus ulcer as above, bilateral lower extremity bandages clean dry intact Psych: Alert and oriented x3        Data Reviewed: I have personally reviewed following labs and imaging studies  CBC: Recent Labs  Lab 06/16/21 1254 06/16/21 1255 06/17/21 0445 06/18/21 0400 06/19/21 0353  WBC  --  13.1* 10.0 8.5 6.6  NEUTROABS  --  10.5*  --   --   --   HGB 12.6 10.4* 9.3* 9.2* 8.1*  HCT 37.0 36.2 32.3* 32.1* 28.2*  MCV  --  83.6 83.5 85.6 84.4  PLT  --  561* 475* 480* 404*    Basic Metabolic Panel: Recent Labs  Lab 06/16/21 1230 06/16/21 1254 06/17/21 0445 06/18/21 0400 06/19/21 0353  NA 136 140 138 134* 137  K 3.5 3.5 3.0* 3.6 3.7  CL 98 97* 101 100 101  CO2 32  --  33* 29 33*  GLUCOSE 101* 102* 91 141* 124*  BUN 14 15 10 10 13   CREATININE 0.75 0.70 0.68 0.68 0.63  CALCIUM 8.3*  --  8.0* 7.8* 8.0*  MG  --   --  2.0  --   --     GFR: Estimated Creatinine Clearance: 41.7 mL/min (by C-G formula based on SCr of 0.63 mg/dL). Liver Function Tests: Recent Labs  Lab 06/16/21 1230 06/17/21 0445  AST 22 19  ALT 16 15  ALKPHOS 119 107  BILITOT 0.6 0.7  PROT 7.8 6.5  ALBUMIN 2.1* 1.9*    Recent Labs  Lab 06/16/21 1230  LIPASE 25    No results for  input(s): AMMONIA in the last 168 hours. Coagulation Profile: Recent Labs  Lab 06/16/21 1255  INR 1.3*    Cardiac Enzymes: No results for input(s): CKTOTAL, CKMB, CKMBINDEX, TROPONINI in the last 168 hours. BNP (last 3 results) No results for input(s): PROBNP in the last 8760 hours. HbA1C: No results for input(s): HGBA1C in the last 72 hours. CBG: No results for input(s): GLUCAP in the last 168 hours. Lipid Profile: No results for input(s): CHOL, HDL, LDLCALC, TRIG, CHOLHDL, LDLDIRECT in the last 72 hours. Thyroid Function Tests: Recent Labs    06/17/21 0526  TSH 0.272*    Anemia Panel: No results for input(s): VITAMINB12, FOLATE, FERRITIN, TIBC, IRON, RETICCTPCT in the last 72 hours.  Sepsis Labs: Recent Labs  Lab 06/16/21 1230 06/16/21 1345 06/17/21 0445  PROCALCITON  --   --  <0.10  LATICACIDVEN 1.7 2.0* 1.3     Recent Results (from the past 240 hour(s))  Resp Panel by RT-PCR (Flu A&B, Covid) Nasopharyngeal Swab     Status: None   Collection Time: 06/16/21 12:21 PM   Specimen: Nasopharyngeal Swab; Nasopharyngeal(NP) swabs in vial transport medium  Result Value Ref Range Status   SARS Coronavirus 2 by RT PCR NEGATIVE NEGATIVE Final    Comment: (NOTE) SARS-CoV-2 target nucleic acids are NOT DETECTED.  The SARS-CoV-2 RNA is generally detectable in upper respiratory specimens during the acute phase of infection. The lowest concentration of SARS-CoV-2 viral copies this assay can detect is 138 copies/mL. A negative result does not preclude SARS-Cov-2 infection and should not be used as the sole basis for treatment or other patient management decisions. A negative result may occur with  improper specimen collection/handling, submission of specimen other than nasopharyngeal swab, presence of viral mutation(s) within the areas targeted by this assay, and inadequate number of viral copies(<138 copies/mL). A negative result must be combined with clinical observations,  patient history, and epidemiological information. The expected result is Negative.  Fact Sheet for Patients:  EntrepreneurPulse.com.au  Fact Sheet for Healthcare Providers:  IncredibleEmployment.be  This test is no t yet approved or cleared by the [place] FDA and  has been authorized for detection and/or diagnosis of SARS-CoV-2 by FDA under an Emergency Use Authorization (EUA). This EUA will remain  in effect (meaning this test can be used) for the duration of the COVID-19 declaration under Section 564(b)(1) of the Act, 21 U.S.C.section 360bbb-3(b)(1), unless the authorization is terminated  or revoked sooner.       Influenza A by PCR NEGATIVE NEGATIVE Final   Influenza B by PCR NEGATIVE NEGATIVE Final    Comment: (NOTE) The Xpert Xpress SARS-CoV-2/FLU/RSV plus assay is intended as an aid in the diagnosis of influenza from Nasopharyngeal swab specimens and should not be used as a sole basis for treatment. Nasal washings and aspirates are unacceptable for Xpert Xpress SARS-CoV-2/FLU/RSV testing.  Fact Sheet for Patients: EntrepreneurPulse.com.au  Fact Sheet for Healthcare Providers: IncredibleEmployment.be  This test is not yet approved or cleared by the [place] FDA and has been authorized for detection and/or diagnosis of SARS-CoV-2 by FDA under an Emergency Use Authorization (EUA). This EUA will remain in effect (meaning this test can be used) for the duration of the COVID-19 declaration under Section 564(b)(1) of the Act, 21 U.S.C. section 360bbb-3(b)(1), unless the authorization is terminated or revoked.  Performed at [facility], [place] [address] [place], [place] [address]   Urine Culture     Status: None   Collection Time: 06/16/21 12:21 PM   Specimen: In/Out Cath Urine  Result Value Ref Range Status   Specimen Description   Final    IN/OUT CATH URINE Performed  at [place] [address] [place], [place] [address]    Special Requests   Final    NONE Performed at [facility], [place] [address], [place], [place] [address]    Culture   Final    NO GROWTH Performed at [facility], [place] [address], [place], [place] [address]    Report Status 06/18/2021 FINAL  Final  Blood Culture (routine x 2)     Status: None (Preliminary result)   Collection Time: 06/16/21 12:30 PM   Specimen:  BLOOD  Result Value Ref Range Status   Specimen Description   Final    BLOOD RIGHT ANTECUBITAL Performed at [place] [address] [place], [place] [address]    Special Requests   Final    BOTTLES DRAWN AEROBIC ONLY Blood Culture adequate volume Performed at [place] [address] [place], [place] [address]    Culture   Final    NO GROWTH 2 DAYS Performed at [place] [address], [place], [place] [address]    Report Status PENDING  Incomplete  Blood Culture (routine x 2)     Status: None (Preliminary result)   Collection Time: 06/16/21 12:30 PM   Specimen: BLOOD  Result Value Ref Range Status   Specimen Description   Final    BLOOD LEFT ARM Performed at [place] [address], [place], [place] [address]    Special Requests   Final    BOTTLES DRAWN AEROBIC ONLY Blood Culture results may not be optimal due to an inadequate volume of blood received in culture bottles Performed at [place] [address] [place], [place] [address]    Culture   Final    NO GROWTH 2 DAYS Performed at [place] [address], [place], [place] [address]    Report Status PENDING  Incomplete          Radiology Studies: No results found.      Scheduled Meds:  atorvastatin  10 mg Oral Daily   buPROPion  150 mg Oral QPM   doxycycline  100 mg Oral Q12H   enoxaparin (LOVENOX) injection  40 mg  Subcutaneous Q24H   fluticasone furoate-vilanterol  1 puff Inhalation Daily   levothyroxine  150 mcg Oral Q0600   liver oil-zinc oxide   Topical QID   pantoprazole  40 mg Oral Daily   predniSONE  40 mg Oral Q breakfast   venlafaxine XR  150 mg Oral QHS   Continuous Infusions:  cefTRIAXone (ROCEPHIN)  IV 1 g (06/19/21 0126)     LOS: 3 days    Time spent: 48mn    Jaman Aro C Wasim Hurlbut, DO [facility]  If 7PM-7AM, please contact night-coverage [email or web address]  06/19/2021, 7:23 AM

## 2021-06-20 ENCOUNTER — Encounter (HOSPITAL_COMMUNITY): Payer: Self-pay | Admitting: Internal Medicine

## 2021-06-20 DIAGNOSIS — L0390 Cellulitis, unspecified: Secondary | ICD-10-CM | POA: Diagnosis not present

## 2021-06-20 LAB — CBC
HCT: 28.3 % — ABNORMAL LOW (ref 36.0–46.0)
Hemoglobin: 8.1 g/dL — ABNORMAL LOW (ref 12.0–15.0)
MCH: 23.5 pg — ABNORMAL LOW (ref 26.0–34.0)
MCHC: 28.6 g/dL — ABNORMAL LOW (ref 30.0–36.0)
MCV: 82.3 fL (ref 80.0–100.0)
Platelets: 401 10*3/uL — ABNORMAL HIGH (ref 150–400)
RBC: 3.44 MIL/uL — ABNORMAL LOW (ref 3.87–5.11)
RDW: 21.1 % — ABNORMAL HIGH (ref 11.5–15.5)
WBC: 7.9 10*3/uL (ref 4.0–10.5)
nRBC: 0 % (ref 0.0–0.2)

## 2021-06-20 LAB — BASIC METABOLIC PANEL
Anion gap: 5 (ref 5–15)
BUN: 14 mg/dL (ref 8–23)
CO2: 31 mmol/L (ref 22–32)
Calcium: 7.8 mg/dL — ABNORMAL LOW (ref 8.9–10.3)
Chloride: 100 mmol/L (ref 98–111)
Creatinine, Ser: 0.71 mg/dL (ref 0.44–1.00)
GFR, Estimated: >60 mL/min (ref >60)
Glucose, Bld: 139 mg/dL — ABNORMAL HIGH (ref 70–99)
Potassium: 3.9 mmol/L (ref 3.5–5.1)
Sodium: 136 mmol/L (ref 135–145)

## 2021-06-20 NOTE — Progress Notes (Signed)
PROGRESS NOTE    MARCHIA [name]  LNL:[id number] DOB: [birth year] DOA: 06/16/2021 PCP: Crist Infante, MD   Brief Narrative:  PHOENYX [name] is a 65 y.o. female with medical history significant of COPD on 2 L nasal cannula, depression/anxiety, GERD, hypothyroidism, chronic lower extremity edema/wounds, hyperlipidemia who presented to [facility] ED on 11/15 via EMS after fall at home.  Patient was found by neighbors in poor state of health, had fallen and had been down for roughly 30 minutes prior to being seen by her neighbors.  Patient reports progressive weakness over the past week, utilizes a walker at baseline.  Also with worsening lower extremity edema and wounds; follows with orthopedics, Dr. Sharol Given outpatient.  Denies any recent medication changes, no sick contacts.  EMS reported home to be infested by bedbugs/fleas and called Adult Protective Services due to the state of her house.  Assessment & Plan:   Sepsis secondary to bilateral lower extremity cellulitis, POA Rule out bug bites/infestation - Noted leukocytosis tachycardia and infection with elevated lactic acidosis/noted bug bites at skin folds and groin. - Imaging unremarkable for osteomyelitis - ESR and CRP moderately elevated at 90 and 21 respectively - Continue IV antibiotics including ceftriaxone - likely transition to p.o. antibiotics pending further evaluation and cultures - Patient completed [place] in the ED at intake - unclear whether or not patient still has active bedbugs (none seen during evaluation today) - continue to bathe daily, linen change frequently in attempts to limit infestation  Acute on chronic respiratory failure with hypoxia secondary to COPD exacerbation, resolving - Hypoxia - sats in the 80s per report at intake, baseline reported to be room air at intake, patient indicates she is on 2 L "as needed" which appears to be essentially around-the-clock - Continue to wean oxygen, continue doxycycline for 5 days - Continue  home nebs, hold off on steroids given lower extremity infection as above   Mechanical fall, ambulatory dysfunction, failure to thrive in the adult, POA - Patient uses a walker at baseline, unable to stand after fall prior to admission, down for 30 minutes - PT OT to continue to follow -currently recommending SNF placement, patient requesting reevaluation for possible discharge home with home therapy -APS contacted at intake, patient alert oriented x4   Noncompliance  -Patient routinely refusing wound care, rolling to replace sacral decubitus ulcer wound dressing -Lengthy discussion at bedside today about need for compliance, patient is alert and oriented x4, otherwise able to make medical decisions and we discussed that should she continue to be noncompliant with wound care dressing changes she would likely have worsening infection which could ultimately lead to loss of limb or increased risk of morbidity and mortality which she appears to understand.  Stage II sacral decubitus ulcer, POA -- Wound care consulted and following   Depression/anxiety - Wellbutrin 150 mg p.o. daily - Venlafaxine 150 mg p.o. daily - Alprazolam 1 mg p.o. 3 times daily as needed anxiety   Hypothyroidism --TSH --Levothyroxine 150 mcg p.o. daily   HLD: Atorvastatin 10 mg p.o. daily   GERD: Continue PPI    DVT prophylaxis: Lovenox Code Status: Full code Family Communication: None present -  left message for primary contact Cornelia Copa over phone  Status is: Inpatient  Dispo: The patient is from: Home             Anticipated d/c is to: Patient refusing discharge to SNF despite lengthy discussion with myself and physical therapy team, patient states she can live with her  cousin [name], unfortunately I do not have this contact number and it is unclear whether or not they will be able to care for her given her ambulatory dysfunction, weight and profound wound care needs.  It does not appear the patient will be  safe to discharge home given her inability to care for herself in any meaningful way and does not have any follow-up at home, home nurse is only around "on occasion" and not a 24-hour or daily nurse as previously indicated             Anticipated d/c date is: 24-48 pending safe disposition             Patient currently not medically stable for discharge  Consultants:  Wound care  Procedures:  None  Antimicrobials:  Ceftriaxone, doxycycline 06/16/2021 ongoing  Subjective: No acute issues or events overnight -patient continues to be noncompliant with staff, when asked she states "the pain is too bad" which we discussed there are plenty of medications to offer so that we can address her wounds appropriately.  She otherwise denies nausea vomiting diarrhea constipation headache fevers chills or chest pain  Objective: Vitals:   06/19/21 0758 06/19/21 1448 06/19/21 2134 06/20/21 0546  BP:  109/64 (!) 96/53 (!) 99/59  Pulse:  (!) 104 91 97  Resp:  _0 Temp:  98.8 F (37.1 C) 99.3 F (37.4 C) 98.4 F (36.9 C)  TempSrc:   Oral Oral  SpO2: 93% 94% 95% (!) 2%  Weight:      Height:        Intake/Output Summary (Last 24 hours) at 06/20/2021 0719 Last data filed at 06/20/2021 0544 Gross per 24 hour  Intake 1202.56 ml  Output 900 ml  Net 302.56 ml    Filed Weights   06/16/21 1145 06/17/21 1447  Weight: 90.2 kg 37.2 kg    Examination:   General:  Pleasantly resting in bed, No acute distress. HEENT:  Normocephalic atraumatic.  Sclerae nonicteric, noninjected.  Extraocular movements intact bilaterally. Neck:  Without mass or deformity.  Trachea is midline. Lungs: Diminished bilaterally, end expiratory wheeze without overt rhonchi or rales Heart:  Regular rate and rhythm.  Without murmurs, rubs, or gallops. Abdomen:  Soft, nontender, nondistended.  Without guarding or rebound. Extremities: Without cyanosis, clubbing, 1+ edema Vascular:  Dorsalis pedis and posterior tibial  pulses palpable bilaterally. Skin: Sacral decubitus ulcer as above, bilateral lower extremity bandages clean dry intact -previous skin evaluation as below Psych: Alert and oriented x3        Data Reviewed: I have personally reviewed following labs and imaging studies  CBC: Recent Labs  Lab 06/16/21 1255 06/17/21 0445 06/18/21 0400 06/19/21 0353 06/20/21 0416  WBC 13.1* 10.0 8.5 6.6 7.9  NEUTROABS 10.5*  --   --   --   --   HGB 10.4* 9.3* 9.2* 8.1* 8.1*  HCT 36.2 32.3* 32.1* 28.2* 28.3*  MCV 83.6 83.5 85.6 84.4 82.3  PLT 561* 475* 480* 404* 401*    Basic Metabolic Panel: Recent Labs  Lab 06/16/21 1230 06/16/21 1254 06/17/21 0445 06/18/21 0400 06/19/21 0353 06/20/21 0416  NA 136 140 138 134* 137 136  K 3.5 3.5 3.0* 3.6 3.7 3.9  CL 98 97* 101 100 101 100  CO2 32  --  33* 29 33* 31  GLUCOSE 101* 102* 91 141* 124* 139*  BUN _1 CREATININE 0.75 0.70 0.68 0.68 0.63 0.71  CALCIUM 8.3*  --  8.0* 7.8* 8.0* 7.8*  MG  --   --  2.0  --   --   --     GFR: Estimated Creatinine Clearance: 41.7 mL/min (by C-G formula based on SCr of 0.71 mg/dL). Liver Function Tests: Recent Labs  Lab 06/16/21 1230 06/17/21 0445  AST 22 19  ALT 16 15  ALKPHOS 119 107  BILITOT 0.6 0.7  PROT 7.8 6.5  ALBUMIN 2.1* 1.9*    Recent Labs  Lab 06/16/21 1230  LIPASE 25    No results for input(s): AMMONIA in the last 168 hours. Coagulation Profile: Recent Labs  Lab 06/16/21 1255  INR 1.3*    Cardiac Enzymes: No results for input(s): CKTOTAL, CKMB, CKMBINDEX, TROPONINI in the last 168 hours. BNP (last 3 results) No results for input(s): PROBNP in the last 8760 hours. HbA1C: No results for input(s): HGBA1C in the last 72 hours. CBG: No results for input(s): GLUCAP in the last 168 hours. Lipid Profile: No results for input(s): CHOL, HDL, LDLCALC, TRIG, CHOLHDL, LDLDIRECT in the last 72 hours. Thyroid Function Tests: No results for input(s): TSH, T4TOTAL, FREET4,  T3FREE, THYROIDAB in the last 72 hours.  Anemia Panel: No results for input(s): VITAMINB12, FOLATE, FERRITIN, TIBC, IRON, RETICCTPCT in the last 72 hours. Sepsis Labs: Recent Labs  Lab 06/16/21 1230 06/16/21 1345 06/17/21 0445  PROCALCITON  --   --  <0.10  LATICACIDVEN 1.7 2.0* 1.3     Recent Results (from the past 240 hour(s))  Resp Panel by RT-PCR (Flu A&B, Covid) Nasopharyngeal Swab     Status: None   Collection Time: 06/16/21 12:21 PM   Specimen: Nasopharyngeal Swab; Nasopharyngeal(NP) swabs in vial transport medium  Result Value Ref Range Status   SARS Coronavirus 2 by RT PCR NEGATIVE NEGATIVE Final    Comment: (NOTE) SARS-CoV-2 target nucleic acids are NOT DETECTED.  The SARS-CoV-2 RNA is generally detectable in upper respiratory specimens during the acute phase of infection. The lowest concentration of SARS-CoV-2 viral copies this assay can detect is 138 copies/mL. A negative result does not preclude SARS-Cov-2 infection and should not be used as the sole basis for treatment or other patient management decisions. A negative result may occur with  improper specimen collection/handling, submission of specimen other than nasopharyngeal swab, presence of viral mutation(s) within the areas targeted by this assay, and inadequate number of viral copies(<138 copies/mL). A negative result must be combined with clinical observations, patient history, and epidemiological information. The expected result is Negative.  Fact Sheet for Patients:  EntrepreneurPulse.com.au  Fact Sheet for Healthcare Providers:  IncredibleEmployment.be  This test is no t yet approved or cleared by the [place] FDA and  has been authorized for detection and/or diagnosis of SARS-CoV-2 by FDA under an Emergency Use Authorization (EUA). This EUA will remain  in effect (meaning this test can be used) for the duration of the COVID-19 declaration under Section  564(b)(1) of the Act, 21 U.S.C.section 360bbb-3(b)(1), unless the authorization is terminated  or revoked sooner.       Influenza A by PCR NEGATIVE NEGATIVE Final   Influenza B by PCR NEGATIVE NEGATIVE Final    Comment: (NOTE) The Xpert Xpress SARS-CoV-2/FLU/RSV plus assay is intended as an aid in the diagnosis of influenza from Nasopharyngeal swab specimens and should not be used as a sole basis for treatment. Nasal washings and aspirates are unacceptable for Xpert Xpress SARS-CoV-2/FLU/RSV testing.  Fact Sheet for Patients: EntrepreneurPulse.com.au  Fact Sheet for Healthcare Providers: IncredibleEmployment.be  This test  is not yet approved or cleared by the [place] and has been authorized for detection and/or diagnosis of SARS-CoV-2 by FDA under an Emergency Use Authorization (EUA). This EUA will remain in effect (meaning this test can be used) for the duration of the COVID-19 declaration under Section 564(b)(1) of the Act, 21 U.S.C. section 360bbb-3(b)(1), unless the authorization is terminated or revoked.  Performed at [facility], [place] [address] [place], [place] [address]   Urine Culture     Status: None   Collection Time: 06/16/21 12:21 PM   Specimen: In/Out Cath Urine  Result Value Ref Range Status   Specimen Description   Final    IN/OUT CATH URINE Performed at [place] [address] [place], [place] [address]    Special Requests   Final    NONE Performed at [facility], [place] [address], [place], [place] [address]    Culture   Final    NO GROWTH Performed at [facility], [place] [address], [place], [place] [address]    Report Status 06/18/2021 FINAL  Final  Blood Culture (routine x 2)     Status: None (Preliminary result)   Collection Time: 06/16/21 12:30 PM   Specimen: BLOOD  Result Value Ref Range Status   Specimen Description   Final     BLOOD RIGHT ANTECUBITAL Performed at [place] [address] [place], [place] [address]    Special Requests   Final    BOTTLES DRAWN AEROBIC ONLY Blood Culture adequate volume Performed at [place] [address], [place], [place] [address]    Culture   Final    NO GROWTH 3 DAYS Performed at [facility], [place] [address], [place], [place] [address]    Report Status PENDING  Incomplete  Blood Culture (routine x 2)     Status: None (Preliminary result)   Collection Time: 06/16/21 12:30 PM   Specimen: BLOOD  Result Value Ref Range Status   Specimen Description   Final    BLOOD LEFT ARM Performed at [facility] [address] [place], [place] [address]    Special Requests   Final    BOTTLES DRAWN AEROBIC ONLY Blood Culture results may not be optimal due to an inadequate volume of blood received in culture bottles Performed at [place] [address] [place], [place] [address]    Culture   Final    NO GROWTH 3 DAYS Performed at [facility], [place] [address], [place], [place] [address]    Report Status PENDING  Incomplete          Radiology Studies: No results found.      Scheduled Meds:  atorvastatin  10 mg Oral Daily   buPROPion  150 mg Oral QPM   doxycycline  100 mg Oral Q12H   enoxaparin (LOVENOX) injection  40 mg Subcutaneous Q24H   fluticasone furoate-vilanterol  1 puff Inhalation Daily   levothyroxine  150 mcg Oral Q0600   liver oil-zinc oxide   Topical QID   pantoprazole  40 mg Oral Daily   predniSONE  40 mg Oral Q breakfast   venlafaxine XR  150 mg Oral QHS   Continuous Infusions:  cefTRIAXone (ROCEPHIN)  IV 1 g (06/20/21 0213)     LOS: 4 days    Time spent: 28mn    Zoriah Pulice C Shalondra Wunschel, DO [facility]  If 7PM-7AM,  please contact night-coverage [email or web address]  06/20/2021, 7:19 AM

## 2021-06-20 NOTE — Progress Notes (Signed)
Right leg dressing changed done, patient refused dressing changes on the left leg complains that its too painful. Prior to dressing changes patient was pre medicated with IV morphine. We will continue to monitor.

## 2021-06-21 DIAGNOSIS — L0390 Cellulitis, unspecified: Secondary | ICD-10-CM | POA: Diagnosis not present

## 2021-06-21 LAB — CULTURE, BLOOD (ROUTINE X 2)
Culture: NO GROWTH
Culture: NO GROWTH
Special Requests: ADEQUATE

## 2021-06-21 LAB — BASIC METABOLIC PANEL
Anion gap: 7 (ref 5–15)
BUN: 16 mg/dL (ref 8–23)
CO2: 30 mmol/L (ref 22–32)
Calcium: 8 mg/dL — ABNORMAL LOW (ref 8.9–10.3)
Chloride: 100 mmol/L (ref 98–111)
Creatinine, Ser: 0.82 mg/dL (ref 0.44–1.00)
GFR, Estimated: >60 mL/min (ref >60)
Glucose, Bld: 102 mg/dL — ABNORMAL HIGH (ref 70–99)
Potassium: 3.6 mmol/L (ref 3.5–5.1)
Sodium: 137 mmol/L (ref 135–145)

## 2021-06-21 LAB — CBC
HCT: 28.3 % — ABNORMAL LOW (ref 36.0–46.0)
Hemoglobin: 8.2 g/dL — ABNORMAL LOW (ref 12.0–15.0)
MCH: 24 pg — ABNORMAL LOW (ref 26.0–34.0)
MCHC: 29 g/dL — ABNORMAL LOW (ref 30.0–36.0)
MCV: 83 fL (ref 80.0–100.0)
Platelets: 375 10*3/uL (ref 150–400)
RBC: 3.41 MIL/uL — ABNORMAL LOW (ref 3.87–5.11)
RDW: 21.2 % — ABNORMAL HIGH (ref 11.5–15.5)
WBC: 9.1 10*3/uL (ref 4.0–10.5)
nRBC: 0 % (ref 0.0–0.2)

## 2021-06-21 NOTE — Progress Notes (Signed)
Pt refused dressing change of her bilateral lower legs. She wants, "to be left alone for now" Will pass on to dayshift nurse.

## 2021-06-21 NOTE — Progress Notes (Signed)
PROGRESS NOTE    [name]  DXI:[id number] DOB: 12/08/1955 DOA: 06/16/2021 PCP: Connie Infante, MD   Brief Narrative:  [name] is a 65 y.o. female with medical history significant of COPD on 2 L nasal cannula, depression/anxiety, GERD, hypothyroidism, chronic lower extremity edema/wounds, hyperlipidemia who presented to [facility] ED on 11/15 via EMS after fall at home.  Patient was found by neighbors in poor state of health, had fallen and had been down for roughly 30 minutes prior to being seen by her neighbors.  Patient reports progressive weakness over the past week, utilizes a walker at baseline.  Also with worsening lower extremity edema and wounds; follows with orthopedics, Dr. Sharol [name] outpatient.  Denies any recent medication changes, no sick contacts.  EMS reported home to be infested by bedbugs/fleas and called Adult Protective Services due to the state of her house.  Assessment & Plan:   Noncompliance  -Patient continues refusing wound care, refuses rolling to replace sacral decubitus ulcer wound dressing and refusing physical therapy -Another lengthy discussion today at bedside about ongoing noncompliance puts patient at risk for losing both her lower extremities as well as increased risk of morbidity mortality.  Attempted to call patient's family "[name]" whom she states she can live with who will continue to address her wounds and medical care as she is unsafe to discharge home alone and is refusing SNF placement despite multiple discussions about her inability to care for herself at home.  Sepsis secondary to bilateral lower extremity cellulitis, POA Rule out bug bites/infestation - Noted leukocytosis tachycardia and infection with elevated lactic acidosis/noted bug bites at skin folds and groin. - Imaging unremarkable for osteomyelitis - ESR and CRP moderately elevated at 90 and 21 respectively - Continue IV antibiotics including ceftriaxone - likely transition to p.o.  antibiotics pending further evaluation and cultures - Patient completed De[place] in the ED at intake - unclear whether or not patient still has active bedbugs (none seen during evaluation today) - continue to bathe daily, linen change frequently in attempts to limit infestation  Acute on chronic respiratory failure with hypoxia secondary to COPD exacerbation, resolving - Hypoxia - sats in the 80s per report at intake, baseline reported to be room air at intake, patient indicates she is on 2 L "as needed" which appears to be essentially around-the-clock - Continue to wean oxygen, continue doxycycline for 5 days - Continue home nebs, hold off on steroids [name] lower extremity infection as above   Mechanical fall, ambulatory dysfunction, failure to thrive in the adult, POA - Patient uses a walker at baseline, unable to stand after fall prior to admission, down for 30 minutes - PT/OT to continue to follow -currently recommending SNF placement, patient requesting reevaluation for possible discharge home with home therapy -APS contacted at intake, patient alert oriented x4   Stage II sacral decubitus ulcer, POA -- Wound care consulted and following   Depression/anxiety - Wellbutrin 150 mg p.o. daily - Venlafaxine 150 mg p.o. daily - Alprazolam 1 mg p.o. 3 times daily as needed anxiety   Hypothyroidism --TSH --Levothyroxine 150 mcg p.o. daily   HLD: Atorvastatin 10 mg p.o. daily   GERD: Continue PPI    DVT prophylaxis: Lovenox Code Status: Full code Family Communication: None present -  left message for cousin [name] over the phone per patient request  Status is: Inpatient  Dispo: The patient is from: Home             Anticipated d/c is  to: Patient refusing discharge to SNF despite lengthy discussion with myself and physical therapy team, patient states she can live with her cousin [name], unfortunately I do not have this contact number and it is unclear whether or not they will be  able to care for her [name] her ambulatory dysfunction, weight and profound wound care needs.  It does not appear the patient will be safe to discharge home [name] her inability to care for herself in any meaningful way and does not have any follow-up at home, home nurse is only around "on occasion" and not a 24-hour or daily nurse as previously indicated             Anticipated d/c date is: 24-48 pending safe disposition             Patient currently not medically stable for discharge  Consultants:  Wound care  Procedures:  None  Antimicrobials:  Ceftriaxone, doxycycline 06/16/2021 ongoing  Subjective: No acute issues or events overnight -patient continues to be noncompliant with PT wound care dressing changes and general care.  Requesting discharge home which we discussed is unsafe, now requesting discharge with cousin [name] who is not returning phone calls, patient otherwise denies nausea vomiting diarrhea constipation headache fevers chills or chest pain.  Objective: Vitals:   06/20/21 0745 06/20/21 1401 06/20/21 2159 06/21/21 0706  BP:  103/79 100/60 (!) 106/58  Pulse:  91 87 90  Resp:  16 18 18   Temp:  97.8 F (36.6 C) 99.5 F (37.5 C)   TempSrc:   Oral   SpO2: 96% 94% 98% 90%  Weight:      Height:        Intake/Output Summary (Last 24 hours) at 06/21/2021 0726 Last data filed at 06/21/2021 0500 Gross per 24 hour  Intake 1780 ml  Output 1300 ml  Net 480 ml    Filed Weights   06/16/21 1145 06/17/21 1447  Weight: 90.2 kg 37.2 kg    Examination:   General:  Pleasantly resting in bed, No acute distress. HEENT:  Normocephalic atraumatic.  Sclerae nonicteric, noninjected.  Extraocular movements intact bilaterally. Neck:  Without mass or deformity.  Trachea is midline. Lungs: Diminished bilaterally, end expiratory wheeze without overt rhonchi or rales Heart:  Regular rate and rhythm.  Without murmurs, rubs, or gallops. Abdomen:  Soft, nontender, nondistended.   Without guarding or rebound. Extremities: Without cyanosis, clubbing, 1+ edema Vascular:  Dorsalis pedis and posterior tibial pulses palpable bilaterally. Skin: Sacral decubitus ulcer as above, bilateral lower extremity bandages clean dry intact -previous skin evaluation as below Psych: Alert and oriented x3  Data Reviewed: I have personally reviewed following labs and imaging studies  CBC: Recent Labs  Lab 06/16/21 1255 06/17/21 0445 06/18/21 0400 06/19/21 0353 06/20/21 0416 06/21/21 0437  WBC 13.1* 10.0 8.5 6.6 7.9 9.1  NEUTROABS 10.5*  --   --   --   --   --   HGB 10.4* 9.3* 9.2* 8.1* 8.1* 8.2*  HCT 36.2 32.3* 32.1* 28.2* 28.3* 28.3*  MCV 83.6 83.5 85.6 84.4 82.3 83.0  PLT 561* 475* 480* 404* 401* 756    Basic Metabolic Panel: Recent Labs  Lab 06/17/21 0445 06/18/21 0400 06/19/21 0353 06/20/21 0416 06/21/21 0437  NA 138 134* 137 136 137  K 3.0* 3.6 3.7 3.9 3.6  CL 101 100 101 100 100  CO2 33* 29 33* 31 30  GLUCOSE 91 141* 124* 139* 102*  BUN 10 10 13 14 16   CREATININE  0.68 0.68 0.63 0.71 0.82  CALCIUM 8.0* 7.8* 8.0* 7.8* 8.0*  MG 2.0  --   --   --   --     GFR: Estimated Creatinine Clearance: 40.7 mL/min (by C-G formula based on SCr of 0.82 mg/dL). Liver Function Tests: Recent Labs  Lab 06/16/21 1230 06/17/21 0445  AST 22 19  ALT 16 15  ALKPHOS 119 107  BILITOT 0.6 0.7  PROT 7.8 6.5  ALBUMIN 2.1* 1.9*    Recent Labs  Lab 06/16/21 1230  LIPASE 25    No results for input(s): AMMONIA in the last 168 hours. Coagulation Profile: Recent Labs  Lab 06/16/21 1255  INR 1.3*    Cardiac Enzymes: No results for input(s): CKTOTAL, CKMB, CKMBINDEX, TROPONINI in the last 168 hours. BNP (last 3 results) No results for input(s): PROBNP in the last 8760 hours. HbA1C: No results for input(s): HGBA1C in the last 72 hours. CBG: No results for input(s): GLUCAP in the last 168 hours. Lipid Profile: No results for input(s): CHOL, HDL, LDLCALC, TRIG,  CHOLHDL, LDLDIRECT in the last 72 hours. Thyroid Function Tests: No results for input(s): TSH, T4TOTAL, FREET4, T3FREE, THYROIDAB in the last 72 hours.  Anemia Panel: No results for input(s): VITAMINB12, FOLATE, FERRITIN, TIBC, IRON, RETICCTPCT in the last 72 hours. Sepsis Labs: Recent Labs  Lab 06/16/21 1230 06/16/21 1345 06/17/21 0445  PROCALCITON  --   --  <0.10  LATICACIDVEN 1.7 2.0* 1.3     Recent Results (from the past 240 hour(s))  Resp Panel by RT-PCR (Flu A&B, Covid) Nasopharyngeal Swab     Status: None   Collection Time: 06/16/21 12:21 PM   Specimen: Nasopharyngeal Swab; Nasopharyngeal(NP) swabs in vial transport medium  Result Value Ref Range Status   SARS Coronavirus 2 by RT PCR NEGATIVE NEGATIVE Final    Comment: (NOTE) SARS-CoV-2 target nucleic acids are NOT DETECTED.  The SARS-CoV-2 RNA is generally detectable in upper respiratory specimens during the acute phase of infection. The lowest concentration of SARS-CoV-2 viral copies this assay can detect is 138 copies/mL. A negative result does not preclude SARS-Cov-2 infection and should not be used as the sole basis for treatment or other patient management decisions. A negative result may occur with  improper specimen collection/handling, submission of specimen other than nasopharyngeal swab, presence of viral mutation(s) within the areas targeted by this assay, and inadequate number of viral copies(<138 copies/mL). A negative result must be combined with clinical observations, patient history, and epidemiological information. The expected result is Negative.  Fact Sheet for Patients:  EntrepreneurPulse.com.au  Fact Sheet for Healthcare Providers:  IncredibleEmployment.be  This test is no t yet approved or cleared by the [place] FDA and  has been authorized for detection and/or diagnosis of SARS-CoV-2 by FDA under an Emergency Use Authorization (EUA). This EUA will  remain  in effect (meaning this test can be used) for the duration of the COVID-19 declaration under Section 564(b)(1) of the Act, 21 U.S.C.section 360bbb-3(b)(1), unless the authorization is terminated  or revoked sooner.       Influenza A by PCR NEGATIVE NEGATIVE Final   Influenza B by PCR NEGATIVE NEGATIVE Final    Comment: (NOTE) The Xpert Xpress SARS-CoV-2/FLU/RSV plus assay is intended as an aid in the diagnosis of influenza from Nasopharyngeal swab specimens and should not be used as a sole basis for treatment. Nasal washings and aspirates are unacceptable for Xpert Xpress SARS-CoV-2/FLU/RSV testing.  Fact Sheet for Patients: EntrepreneurPulse.com.au  Fact Sheet for Healthcare  Providers: IncredibleEmployment.be  This test is not yet approved or cleared by the [place] and has been authorized for detection and/or diagnosis of SARS-CoV-2 by FDA under an Emergency Use Authorization (EUA). This EUA will remain in effect (meaning this test can be used) for the duration of the COVID-19 declaration under Section 564(b)(1) of the Act, 21 U.S.C. section 360bbb-3(b)(1), unless the authorization is terminated or revoked.  Performed at [facility], [place] [address] [place], [place] [address]   Urine Culture     Status: None   Collection Time: 06/16/21 12:21 PM   Specimen: In/Out Cath Urine  Result Value Ref Range Status   Specimen Description   Final    IN/OUT CATH URINE Performed at [place] [address], [place], [place] [address]    Special Requests   Final    NONE Performed at [facility], [place] [address] [place], [place] [address]    Culture   Final    NO GROWTH Performed at [facility], [place] [address] [place], [place] [address]    Report Status 06/18/2021 FINAL  Final  Blood Culture (routine x 2)     Status: None (Preliminary result)   Collection  Time: 06/16/21 12:30 PM   Specimen: BLOOD  Result Value Ref Range Status   Specimen Description   Final    BLOOD RIGHT ANTECUBITAL Performed at [place] [address], [place], [place] [address]    Special Requests   Final    BOTTLES DRAWN AEROBIC ONLY Blood Culture adequate volume Performed at [place] [address], [place], [place] [address]    Culture   Final    NO GROWTH 4 DAYS Performed at [facility], [place] [address], [place], [place] [address]    Report Status PENDING  Incomplete  Blood Culture (routine x 2)     Status: None (Preliminary result)   Collection Time: 06/16/21 12:30 PM   Specimen: BLOOD  Result Value Ref Range Status   Specimen Description   Final    BLOOD LEFT ARM Performed at [place] [address], [place], [place] [address]    Special Requests   Final    BOTTLES DRAWN AEROBIC ONLY Blood Culture results may not be optimal due to an inadequate volume of blood received in culture bottles Performed at [place] [address], [place], [place] [address]    Culture   Final    NO GROWTH 4 DAYS Performed at [facility], [place] [address], [place], [place] [address]    Report Status PENDING  Incomplete   Radiology Studies: No results found.  Scheduled Meds:  atorvastatin  10 mg Oral Daily   buPROPion  150 mg Oral QPM   doxycycline  100 mg Oral Q12H   enoxaparin (LOVENOX) injection  40 mg Subcutaneous Q24H   fluticasone furoate-vilanterol  1 puff Inhalation Daily   levothyroxine  150 mcg Oral Q0600   liver oil-zinc oxide   Topical QID   pantoprazole  40 mg Oral Daily   predniSONE  40 mg Oral Q breakfast   venlafaxine XR  150 mg Oral QHS   Continuous Infusions:  cefTRIAXone (ROCEPHIN)  IV 1 g (06/20/21 2346)     LOS: 5 days    Time spent: 85mn  Amarah Brossman C Nike [place], DO [facility]  If 7PM-7AM, please contact  night-coverage [email or web address]  06/21/2021, 7:26  AM

## 2021-06-21 NOTE — Progress Notes (Signed)
Patient was up in chair for 2 hours this afternoon. She had all her dressings changed about 1600. She was very agreeable to everything she needed to do.

## 2021-06-22 DIAGNOSIS — L0390 Cellulitis, unspecified: Secondary | ICD-10-CM | POA: Diagnosis not present

## 2021-06-22 LAB — BASIC METABOLIC PANEL
Anion gap: 5 (ref 5–15)
BUN: 15 mg/dL (ref 8–23)
CO2: 33 mmol/L — ABNORMAL HIGH (ref 22–32)
Calcium: 8 mg/dL — ABNORMAL LOW (ref 8.9–10.3)
Chloride: 97 mmol/L — ABNORMAL LOW (ref 98–111)
Creatinine, Ser: 0.66 mg/dL (ref 0.44–1.00)
GFR, Estimated: >60 mL/min (ref >60)
Glucose, Bld: 95 mg/dL (ref 70–99)
Potassium: 3.6 mmol/L (ref 3.5–5.1)
Sodium: 135 mmol/L (ref 135–145)

## 2021-06-22 LAB — CBC
HCT: 28.6 % — ABNORMAL LOW (ref 36.0–46.0)
Hemoglobin: 8.5 g/dL — ABNORMAL LOW (ref 12.0–15.0)
MCH: 24.7 pg — ABNORMAL LOW (ref 26.0–34.0)
MCHC: 29.7 g/dL — ABNORMAL LOW (ref 30.0–36.0)
MCV: 83.1 fL (ref 80.0–100.0)
Platelets: 446 10*3/uL — ABNORMAL HIGH (ref 150–400)
RBC: 3.44 MIL/uL — ABNORMAL LOW (ref 3.87–5.11)
RDW: 21.7 % — ABNORMAL HIGH (ref 11.5–15.5)
WBC: 8.2 10*3/uL (ref 4.0–10.5)
nRBC: 0 % (ref 0.0–0.2)

## 2021-06-22 NOTE — Progress Notes (Addendum)
Patient refusing dressing change and reports her dressing was just changed 06/21/2021 at   6 pm (1800). Will make DayShift Nurse aware.

## 2021-06-22 NOTE — Progress Notes (Signed)
Physical Therapy Treatment Patient Details Name: [name] MRN: [id number] DOB: November 02, 1955 Today's Date: 06/22/2021   History of Present Illness Pt admitted from home s/p fall and dx with acute respiratory failure with hypoxia 2* COPD exacerbation, bil LE cellulitis with multiple wounds, FTT and noted presence of bed bugs.  Pt with hx of COPD and painful R knee ("I need a knee replacement but I'm too old")    PT Comments    Pt was OOB finished breakfast and requesting to go back to bed due to "leg pain".  General transfer comment: took 2 attempts to rise from recliner with forward rocking mumentum and use b UE's to push self up.  Also performed a touilet transfer with VC's to use wall rail.  Pt present with poor forward flex posture B flex hips/knees. General Gait Details: pt only agreed to amb in room to bathroom then back to bed using walker.  Present with poor forward flex posture and decreased stance time R LE "bad knee".  Pt stated she has been on a walker > 2 years.  Pt also stated she uses oxygen "as needed".  RA during activity was 96% with HR 108 and 2/4 dyspnea.  Pt present with limited activity tolerance. General bed mobility comments: pt self able to get B LE up into bed with increased time.    Recommendations for follow up therapy are one component of a multi-disciplinary discharge planning process, led by the attending physician.  Recommendations may be updated based on patient status, additional functional criteria and insurance authorization.  Follow Up Recommendations  Skilled nursing-short term rehab (<3 hours/day) (pt declining Rec for SNF)     Assistance Recommended at Discharge Frequent or constant Supervision/Assistance  Equipment Recommendations  None recommended by PT    Recommendations for Other Services       Precautions / Restrictions Precautions Precautions: Fall Precaution Comments: bed bugs, fleas Restrictions Weight Bearing Restrictions: No      Mobility  Bed Mobility Overal bed mobility: Needs Assistance Bed Mobility: Sit to Supine       Sit to supine: Min assist   General bed mobility comments: pt self able to get B LE up into bed with increased time    Transfers Overall transfer level: Needs assistance Equipment used: Rolling walker (2 wheels) Transfers: Sit to/from Stand Sit to Stand: Min assist           General transfer comment: took 2 attempts to rise from recliner with forward rocking mumentum and use b UE's to push self up.  Also performed a touilet transfer with VC's to use wall rail.  Pt present with poor forward flex posture B flex hips/knees.    Ambulation/Gait Ambulation/Gait assistance: Min guard Gait Distance (Feet): 12 Feet Assistive device: Rolling walker (2 wheels) Gait Pattern/deviations: Step-to pattern;Decreased stance time - right;Trunk flexed Gait velocity: decreased     General Gait Details: pt only agreed to amb in room to bathroom then back to bed using walker.  Present with poor forward flex posture and decreased stance time R LE "bad knee".  Pt stated she has been on a walker > 2 years.  Pt also stated she uses oxygen "as needed".  RA during activity was 96% with HR 108 and 2/4 dyspnea.  Pt present with limited activity tolerance.   Stairs             Wheelchair Mobility    Modified Rankin (Stroke Patients Only)  Balance                                            Cognition Arousal/Alertness: Awake/alert   Overall Cognitive Status: Within Functional Limits for tasks assessed                                 General Comments: Appears slow to process - ? hearing deficits        Exercises      General Comments        Pertinent Vitals/Pain Pain Assessment: Faces Faces Pain Scale: Hurts little more Pain Location: R knee "bad" Pain Descriptors / Indicators: Grimacing;Guarding Pain Intervention(s): Monitored during  session;Repositioned    Home Living                          Prior Function            PT Goals (current goals can now be found in the care plan section) Progress towards PT goals: Progressing toward goals    Frequency    Min 2X/week      PT Plan Current plan remains appropriate    Co-evaluation              AM-PAC PT "6 Clicks" Mobility   Outcome Measure  Help needed turning from your back to your side while in a flat bed without using bedrails?: A Little Help needed moving from lying on your back to sitting on the side of a flat bed without using bedrails?: A Little Help needed moving to and from a bed to a chair (including a wheelchair)?: A Little Help needed standing up from a chair using your arms (e.g., wheelchair or bedside chair)?: A Little Help needed to walk in hospital room?: A Lot Help needed climbing 3-5 steps with a railing? : Total 6 Click Score: 15    End of Session Equipment Utilized During Treatment: Gait belt Activity Tolerance: Patient limited by fatigue;Patient limited by pain Patient left: in bed;with call bell/phone within reach;with bed alarm set Nurse Communication: Mobility status PT Visit Diagnosis: Unsteadiness on feet (R26.81);Muscle weakness (generalized) (M62.81);Difficulty in walking, not elsewhere classified (R26.2);Pain Pain - Right/Left: Right Pain - part of body: Knee     Time: 1007-1035 PT Time Calculation (min) (ACUTE ONLY): 28 min  Charges:  $Gait Training: 8-22 mins $Therapeutic Activity: 8-22 mins                     {Elwin Tsou  PTA Acute  Rehabilitation Services Pager      [phone] Office      [phone]

## 2021-06-22 NOTE — TOC Progression Note (Addendum)
Transition of Care [facility]) - Progression Note   Patient Details  Name: [name] MRN: [id number] Date of Birth: [birth year]  [facility]) CM/SW Contact  Ewing Schlein, LCSW Phone Number: 06/22/2021, 4:07 PM  Clinical Narrative: CSW spoke with patient's DSS worker, [name] [phone]), who confirmed the patient can discharge to her cousin's home. CSW made [facility] referrals to the following agencies:  [facility]: out of network with Medicaid [facility]: out of network with Medicaid [facility]: declined [facility]: declined [facility]: declined [facility]: out of network with Medicaid [facility]: declined  CSW updated patient that no HH agency will accept the patient. Patient's cousin, [name], reported she thinks the family can manage her at home. Family is here for wound care teaching. Patient will discharge home with several days of wound care supplies, but supplies will need to be purchased after discharge. Patient has a rollator, but will need a 3N1. Order placed. CSW made DME referral to [facility] with [facility]. [facility] to deliver 3N1 to patient's room. Patient will need a ride home tomorrow and will go to her cousin's address: [address] [place], [place] [address]. TOC to follow.   Expected Discharge Plan: Home/Self Care Barriers to Discharge: Continued Medical Work up, No Home Care Agency will accept this patient  Expected Discharge Plan and Services Expected Discharge Plan: Home/Self Care In-house Referral: Clinical Social Work Post Acute Care Choice: Durable Medical Equipment Living arrangements for the past 2 months: Mobile Home        DME Arranged: 3-N-1 DME Agency: [facility] Date DME Agency Contacted: 06/22/21 Representative spoke with at DME Agency: [name]  Readmission Risk Interventions No flowsheet data found.

## 2021-06-22 NOTE — Progress Notes (Signed)
PROGRESS NOTE    SUSANN LAWHORNE  PZW:[id number] DOB: Oct 14, 1955 DOA: 06/16/2021 PCP: Crist Infante, MD   Brief Narrative:  [name] is a 65 y.o. female with medical history significant of COPD on 2 L nasal cannula, depression/anxiety, GERD, hypothyroidism, chronic lower extremity edema/wounds, hyperlipidemia who presented to [facility] ED on 11/15 via EMS after fall at home.  Patient was found by neighbors in poor state of health, had fallen and had been down for roughly 30 minutes prior to being seen by her neighbors.  Patient reports progressive weakness over the past week, utilizes a walker at baseline.  Also with worsening lower extremity edema and wounds; follows with orthopedics, Dr. Sharol Given outpatient.  Denies any recent medication changes, no sick contacts.  EMS reported home to be infested by bedbugs/fleas and called Adult Protective Services due to the state of her house.  Assessment & Plan:   Noncompliance  - Patient continues refusing wound care, refuses rolling to replace sacral decubitus ulcer wound dressing and refusing physical therapy - Another lengthy discussion today at bedside about ongoing noncompliance puts patient at risk for losing both her lower extremities as well as increased risk of morbidity mortality.  Attempted to call patient's family "[name]" whom she states she can live with who will continue to address her wounds and medical care as she is unsafe to discharge home alone and is refusing SNF placement despite multiple discussions about her inability to care for herself at home.  Sepsis secondary to bilateral lower extremity cellulitis, POA Rule out bug bites/infestation - Noted leukocytosis tachycardia and infection with elevated lactic acidosis/noted bug bites at skin folds and groin. - Imaging unremarkable for osteomyelitis - ESR and CRP moderately elevated at 90 and 21 respectively - Continue IV antibiotics including ceftriaxone - likely transition to p.o.  antibiotics pending further evaluation and cultures - Patient completed Decon in the ED at intake - unclear whether or not patient still has active bedbugs (none seen during evaluation today) - continue to bathe daily, linen change frequently in attempts to limit infestation  Acute on chronic respiratory failure with hypoxia secondary to COPD exacerbation, resolving - Hypoxia - sats in the 80s per report at intake, baseline reported to be room air at intake, patient indicates she is on 2L "as needed" which appears to be essentially around-the-clock - Continue to wean oxygen, continue doxycycline for 5 days - Continue home nebs, hold off on steroids given lower extremity infection as above   Mechanical fall, ambulatory dysfunction, failure to thrive in the adult, POA - Patient uses a walker at baseline, unable to stand after fall prior to admission, down for 30 minutes - PT/OT to continue to follow -currently recommending SNF placement, patient requesting reevaluation for possible discharge home with home therapy - APS contacted at intake, patient alert oriented x4   Stage II sacral decubitus ulcer, POA - Wound care consulted and following   Depression/anxiety - Wellbutrin 150 mg p.o. daily - Venlafaxine 150 mg p.o. daily - Alprazolam 1 mg p.o. 3 times daily as needed anxiety   Hypothyroidism - TSH - Levothyroxine 150 mcg p.o. daily   HLD: Atorvastatin 10 mg p.o. daily   GERD: Continue PPI    DVT prophylaxis: Lovenox Code Status: Full code Family Communication: Lengthy discussion with cousin Helene Kelp over the phone per patient request.  Status is: Inpatient  Dispo: The patient is from: Home             Anticipated d/c is  to: Patient refusing discharge to SNF despite lengthy discussion with myself and physical therapy team, patient states she can live with her cousin [name], unfortunately I do not have this contact number and it is unclear whether or not they will be able to  care for her given her ambulatory dysfunction, weight and profound wound care needs.  It does not appear the patient will be safe to discharge home given her inability to care for herself in any meaningful way and does not have any follow-up at home, home nurse is only around "on occasion" and not a 24-hour or daily nurse as previously indicated             Anticipated d/c date is: 24-48 pending safe disposition             Patient currently not medically stable for discharge  Consultants:  Wound care  Procedures:  None  Antimicrobials:  Ceftriaxone, doxycycline 06/16/2021 ongoing  Subjective: No acute issues or events overnight - patient continues to be noncompliant with PT wound care dressing changes and general care.  Requesting discharge home with family once family has been trained on how to provide wound care.  Objective: Vitals:   06/21/21 0851 06/21/21 1423 06/21/21 2136 06/22/21 0400  BP:  112/60 (!) 108/54 107/60  Pulse:  94 82 79  Resp:  18 18 18   Temp:  98.5 F (36.9 C) 98.1 F (36.7 C) 98.1 F (36.7 C)  TempSrc:  Oral    SpO2: 90% 96% 100% 94%  Weight:      Height:        Intake/Output Summary (Last 24 hours) at 06/22/2021 0721 Last data filed at 06/22/2021 0600 Gross per 24 hour  Intake 1760 ml  Output 3000 ml  Net -1240 ml    Filed Weights   06/16/21 1145 06/17/21 1447  Weight: 90.2 kg 37.2 kg    Examination:   General:  Pleasantly resting in bed, No acute distress. HEENT:  Normocephalic atraumatic.  Sclerae nonicteric, noninjected.  Extraocular movements intact bilaterally. Neck:  Without mass or deformity.  Trachea is midline. Lungs: Diminished bilaterally, end expiratory wheeze without overt rhonchi or rales Heart:  Regular rate and rhythm.  Without murmurs, rubs, or gallops. Abdomen:  Soft, nontender, nondistended.  Without guarding or rebound. Extremities: Without cyanosis, clubbing, 1+ edema Vascular:  Dorsalis pedis and posterior tibial  pulses palpable bilaterally. Skin: Sacral decubitus ulcer as above, bilateral lower extremity bandages clean dry intact -previous skin evaluation as below Psych: Alert and oriented x3  Data Reviewed: I have personally reviewed following labs and imaging studies  CBC: Recent Labs  Lab 06/16/21 1255 06/17/21 0445 06/18/21 0400 06/19/21 0353 06/20/21 0416 06/21/21 0437 06/22/21 0432  WBC 13.1*   < > 8.5 6.6 7.9 9.1 8.2  NEUTROABS 10.5*  --   --   --   --   --   --   HGB 10.4*   < > 9.2* 8.1* 8.1* 8.2* 8.5*  HCT 36.2   < > 32.1* 28.2* 28.3* 28.3* 28.6*  MCV 83.6   < > 85.6 84.4 82.3 83.0 83.1  PLT 561*   < > 480* 404* 401* 375 446*   < > = values in this interval not displayed.    Basic Metabolic Panel: Recent Labs  Lab 06/17/21 0445 06/18/21 0400 06/19/21 0353 06/20/21 0416 06/21/21 0437 06/22/21 0432  NA 138 134* 137 136 137 135  K 3.0* 3.6 3.7 3.9 3.6 3.6  CL 101  100 101 100 100 97*  CO2 33* 29 33* 31 30 33*  GLUCOSE 91 141* 124* 139* 102* 95  BUN 10 10 13 14 16 15   CREATININE 0.68 0.68 0.63 0.71 0.82 0.66  CALCIUM 8.0* 7.8* 8.0* 7.8* 8.0* 8.0*  MG 2.0  --   --   --   --   --     GFR: Estimated Creatinine Clearance: 41.7 mL/min (by C-G formula based on SCr of 0.66 mg/dL). Liver Function Tests: Recent Labs  Lab 06/16/21 1230 06/17/21 0445  AST 22 19  ALT 16 15  ALKPHOS 119 107  BILITOT 0.6 0.7  PROT 7.8 6.5  ALBUMIN 2.1* 1.9*    Recent Labs  Lab 06/16/21 1230  LIPASE 25    No results for input(s): AMMONIA in the last 168 hours. Coagulation Profile: Recent Labs  Lab 06/16/21 1255  INR 1.3*    Cardiac Enzymes: No results for input(s): CKTOTAL, CKMB, CKMBINDEX, TROPONINI in the last 168 hours. BNP (last 3 results) No results for input(s): PROBNP in the last 8760 hours. HbA1C: No results for input(s): HGBA1C in the last 72 hours. CBG: No results for input(s): GLUCAP in the last 168 hours. Lipid Profile: No results for input(s): CHOL, HDL,  LDLCALC, TRIG, CHOLHDL, LDLDIRECT in the last 72 hours. Thyroid Function Tests: No results for input(s): TSH, T4TOTAL, FREET4, T3FREE, THYROIDAB in the last 72 hours.  Anemia Panel: No results for input(s): VITAMINB12, FOLATE, FERRITIN, TIBC, IRON, RETICCTPCT in the last 72 hours. Sepsis Labs: Recent Labs  Lab 06/16/21 1230 06/16/21 1345 06/17/21 0445  PROCALCITON  --   --  <0.10  LATICACIDVEN 1.7 2.0* 1.3     Recent Results (from the past 240 hour(s))  Resp Panel by RT-PCR (Flu A&B, Covid) Nasopharyngeal Swab     Status: None   Collection Time: 06/16/21 12:21 PM   Specimen: Nasopharyngeal Swab; Nasopharyngeal(NP) swabs in vial transport medium  Result Value Ref Range Status   SARS Coronavirus 2 by RT PCR NEGATIVE NEGATIVE Final    Comment: (NOTE) SARS-CoV-2 target nucleic acids are NOT DETECTED.  The SARS-CoV-2 RNA is generally detectable in upper respiratory specimens during the acute phase of infection. The lowest concentration of SARS-CoV-2 viral copies this assay can detect is 138 copies/mL. A negative result does not preclude SARS-Cov-2 infection and should not be used as the sole basis for treatment or other patient management decisions. A negative result may occur with  improper specimen collection/handling, submission of specimen other than nasopharyngeal swab, presence of viral mutation(s) within the areas targeted by this assay, and inadequate number of viral copies(<138 copies/mL). A negative result must be combined with clinical observations, patient history, and epidemiological information. The expected result is Negative.  Fact Sheet for Patients:  EntrepreneurPulse.com.au  Fact Sheet for Healthcare Providers:  IncredibleEmployment.be  This test is no t yet approved or cleared by the [place] FDA and  has been authorized for detection and/or diagnosis of SARS-CoV-2 by FDA under an Emergency Use Authorization (EUA).  This EUA will remain  in effect (meaning this test can be used) for the duration of the COVID-19 declaration under Section 564(b)(1) of the Act, 21 U.S.C.section 360bbb-3(b)(1), unless the authorization is terminated  or revoked sooner.       Influenza A by PCR NEGATIVE NEGATIVE Final   Influenza B by PCR NEGATIVE NEGATIVE Final    Comment: (NOTE) The Xpert Xpress SARS-CoV-2/FLU/RSV plus assay is intended as an aid in the diagnosis of influenza from  Nasopharyngeal swab specimens and should not be used as a sole basis for treatment. Nasal washings and aspirates are unacceptable for Xpert Xpress SARS-CoV-2/FLU/RSV testing.  Fact Sheet for Patients: EntrepreneurPulse.com.au  Fact Sheet for Healthcare Providers: IncredibleEmployment.be  This test is not yet approved or cleared by the [place] FDA and has been authorized for detection and/or diagnosis of SARS-CoV-2 by FDA under an Emergency Use Authorization (EUA). This EUA will remain in effect (meaning this test can be used) for the duration of the COVID-19 declaration under Section 564(b)(1) of the Act, 21 U.S.C. section 360bbb-3(b)(1), unless the authorization is terminated or revoked.  Performed at [facility], [place] [address], [place], [place] [address]   Urine Culture     Status: None   Collection Time: 06/16/21 12:21 PM   Specimen: In/Out Cath Urine  Result Value Ref Range Status   Specimen Description   Final    IN/OUT CATH URINE Performed at [place] [address] [place], [place] [address]    Special Requests   Final    NONE Performed at [facility], [place] [address] [place], [place] [address]    Culture   Final    NO GROWTH Performed at [facility], [place] [address] [place], [place] [address]    Report Status 06/18/2021 FINAL  Final  Blood Culture (routine x 2)     Status: None   Collection Time:  06/16/21 12:30 PM   Specimen: BLOOD  Result Value Ref Range Status   Specimen Description   Final    BLOOD RIGHT ANTECUBITAL Performed at [facility] [address], [place], [place] [address]    Special Requests   Final    BOTTLES DRAWN AEROBIC ONLY Blood Culture adequate volume Performed at [facility] [address], [place], [place] [address]    Culture   Final    NO GROWTH 5 DAYS Performed at [facility], [place] [address], [place], [place] [address]    Report Status 06/21/2021 FINAL  Final  Blood Culture (routine x 2)     Status: None   Collection Time: 06/16/21 12:30 PM   Specimen: BLOOD  Result Value Ref Range Status   Specimen Description   Final    BLOOD LEFT ARM Performed at [facility] [address], [place], [place] [address]    Special Requests   Final    BOTTLES DRAWN AEROBIC ONLY Blood Culture results may not be optimal due to an inadequate volume of blood received in culture bottles Performed at [place] [address] [place], [place] [address]    Culture   Final    NO GROWTH 5 DAYS Performed at [facility], [place] [address], [place],  [address]    Report Status 06/21/2021 FINAL  Final   Radiology Studies: No results found.  Scheduled Meds:  atorvastatin  10 mg Oral Daily   buPROPion  150 mg Oral QPM   doxycycline  100 mg Oral Q12H   enoxaparin (LOVENOX) injection  40 mg Subcutaneous Q24H   fluticasone furoate-vilanterol  1 puff Inhalation Daily   levothyroxine  150 mcg Oral Q0600   liver oil-zinc oxide   Topical QID   pantoprazole  40 mg Oral Daily   venlafaxine XR  150 mg Oral QHS   Continuous Infusions:  cefTRIAXone (ROCEPHIN)  IV 1 g (06/22/21 0526)     LOS: 6  days    Time spent: 23mn  Deshara Rossi C Etienne Mowers, DO [facility]  If 7PM-7AM, please contact night-coverage [email or web address]  06/22/2021, 7:21 AM

## 2021-06-23 DIAGNOSIS — L0390 Cellulitis, unspecified: Secondary | ICD-10-CM | POA: Diagnosis not present

## 2021-06-23 LAB — CREATININE, SERUM
Creatinine, Ser: 0.67 mg/dL (ref 0.44–1.00)
GFR, Estimated: >60 mL/min (ref >60)

## 2021-06-23 MED ORDER — DOXYCYCLINE HYCLATE 100 MG PO TABS: 100.0000 mg | ORAL_TABLET | Freq: Two times a day (BID) | ORAL | 0 refills | Status: AC

## 2021-06-23 MED ORDER — ZINC OXIDE 40 % EX OINT: TOPICAL_OINTMENT | Freq: Four times a day (QID) | CUTANEOUS | 0 refills | Status: DC

## 2021-06-23 NOTE — TOC Transition Note (Signed)
Transition of Care [facility]) - CM/SW Discharge Note  Patient Details  Name: ZOSIA LUCCHESE MRN: [id number] Date of Birth: Jul 21, 1956  Transition of Care [facility]) CM/SW Contact:  Ewing Schlein, LCSW Phone Number: 06/23/2021, 11:51 AM  Clinical Narrative: Patient to discharge home today. CSW set up appointment at the wound care center for Monday June 29, 2021 at 8:45am. CSW explained to patient she will need to call Medicaid to set up transportation. CSW also left VM for patient's cousin, Sebastian Ache, regarding the wound care appointment in case patient needs assistance with setting up transportation. Development worker, community and emailed to [employer]. RN to call transportation. TOC signing off.  Final next level of care: Home/Self Care Barriers to Discharge: No Home Care Agency will accept this patient, Barriers Unresolved (comment)  Patient Goals and CMS Choice Patient states their goals for this hospitalization and ongoing recovery are:: Discharge home CMS Medicare.gov Compare Post Acute Care list provided to:: Patient Choice offered to / list presented to : Patient  Discharge Plan and Services In-house Referral: Clinical Social Work Post Acute Care Choice: Durable Medical Equipment          DME Arranged: 3-N-1 DME Agency: [employer] Date DME Agency Contacted: 06/22/21 Representative spoke with at DME Agency: Barbette Or  Readmission Risk Interventions No flowsheet data found.

## 2021-06-23 NOTE — Plan of Care (Signed)
  Problem: Education: Goal: Knowledge of General Education information will improve Description: Including pain rating scale, medication(s)/side effects and non-pharmacologic comfort measures Outcome: Adequate for Discharge   Problem: Health Behavior/Discharge Planning: Goal: Ability to manage health-related needs will improve Outcome: Adequate for Discharge   Problem: Clinical Measurements: Goal: Ability to maintain clinical measurements within normal limits will improve Outcome: Adequate for Discharge Goal: Will remain free from infection Outcome: Adequate for Discharge Goal: Diagnostic test results will improve Outcome: Adequate for Discharge Goal: Respiratory complications will improve Outcome: Adequate for Discharge Goal: Cardiovascular complication will be avoided Outcome: Adequate for Discharge   Problem: Activity: Goal: Risk for activity intolerance will decrease Outcome: Adequate for Discharge   Problem: Nutrition: Goal: Adequate nutrition will be maintained Outcome: Adequate for Discharge   Problem: Coping: Goal: Level of anxiety will decrease Outcome: Adequate for Discharge   Problem: Elimination: Goal: Will not experience complications related to bowel motility Outcome: Adequate for Discharge Goal: Will not experience complications related to urinary retention Outcome: Adequate for Discharge   Problem: Pain Managment: Goal: General experience of comfort will improve Outcome: Adequate for Discharge   Problem: Safety: Goal: Ability to remain free from injury will improve Outcome: Adequate for Discharge   Problem: Skin Integrity: Goal: Risk for impaired skin integrity will decrease Outcome: Adequate for Discharge   Problem: Clinical Measurements: Goal: Ability to avoid or minimize complications of infection will improve Outcome: Adequate for Discharge   Problem: Skin Integrity: Goal: Skin integrity will improve Outcome: Adequate for Discharge   

## 2021-06-23 NOTE — Progress Notes (Signed)
Assessment unchanged. Pt verbalized understanding of dc instructions through teach back. Dressing changes done to legs last evening with Cousin at bedside (whom pt is going to stay with) and thorough instructions given By RN written and verbal. Bag of dsg supplies ready with written instructions at bedside. Discharged via wc to front entrance accompanied by NT x 2.

## 2021-06-23 NOTE — Progress Notes (Signed)
Patient refused am dressing change due dressing being changed late yesterday (06/22/2021). Will pass on to Dayshift nurse.

## 2021-06-23 NOTE — Discharge Summary (Signed)
Physician Discharge Summary  MARQUISHA SWEEN [id number] DOB: 1956-01-21 DOA: 06/16/2021  PCP: Crist Infante, MD  Admit date: 06/16/2021 Discharge date: 06/23/2021  Admitted From: Home Disposition: With family, Helene Kelp and body; she is unsafe to return to her own home  Recommendations for Outpatient Follow-up:  Follow up with PCP in 1-2 weeks Please obtain BMP/CBC in one week Please follow up with wound care as scheduled  Equipment/Devices: 3 and 1, walker, shower chair  Discharge Condition: Stable CODE STATUS: Full Diet recommendation: Low-salt low-fat low-carb diet  Brief/Interim Summary: JULENA CAPPELLA is a 65 y.o. female with medical history significant of COPD on 2 L nasal cannula, depression/anxiety, GERD, hypothyroidism, chronic lower extremity edema/wounds, hyperlipidemia who presented to [facility] ED on 11/15 via EMS after fall at home.  Patient was found by neighbors in poor state of health, had fallen and had been down for roughly 30 minutes prior to being seen by her neighbors.  Patient reports progressive weakness over the past week, utilizes a walker at baseline.  Also with worsening lower extremity edema and wounds; follows with orthopedics, Dr. Sharol Given outpatient.  Denies any recent medication changes, no sick contacts.  EMS reported home to be infested by bedbugs/fleas and called Adult Protective Services due to the state of her house.   Patient admitted for sepsis secondary to bilateral lower extremity cellulitis, improving on IV antibiotics and wound care dressings despite patient's refusal on occasion to have dressing changes.  Lengthy discussion with patient and physical therapy as well as care team about safe disposition, patient is unable to discharge back home due to its unsafe environment, EMS called Adult Protective Services on the location, patient also unable to care for herself and perform dressing changes and wound care, patient's cousin Helene Kelp and her husband Mortimer Fries have  agreed to take patient to their house for ongoing wound care treatment dressing changes and antibiotics to make sure patient remains compliant with recommendations.  Outpatient follow-up with wound care has been scheduled, patient does not qualify for home health care at this time.  Patient otherwise stable and agreeable for discharge with family to continue doxycycline.  Patient needs to follow-up with PCP in the next 2 weeks for further evaluation and treatment for chronic comorbid conditions as well as to continue wound care follow-up as scheduled.  Assessment & Plan:   Noncompliance  - Patient continues refusing wound care, refuses rolling to replace sacral decubitus ulcer wound dressing and refusing physical therapy - Another lengthy discussion today at bedside about ongoing noncompliance puts patient at risk for losing both her lower extremities as well as increased risk of morbidity mortality.  Attempted to call patient's family "[name]" whom she states she can live with who will continue to address her wounds and medical care as she is unsafe to discharge home alone and is refusing SNF placement despite multiple discussions about her inability to care for herself at home.   Sepsis secondary to bilateral lower extremity cellulitis, POA Rule out bug bites/infestation -Imaging confirms superficial infection, transitioning to p.o. doxycycline for continued coverage -Ongoing need for dressing changes and wound care follow-up as above   Acute on chronic respiratory failure with hypoxia secondary to COPD exacerbation, resolving -Baseline oxygen 2 L "as needed" but appears to be on 2 L throughout the day here which is likely her baseline   Mechanical fall, ambulatory dysfunction, failure to thrive in the adult, POA -Discharge with family, patient refuses discharge to SNF despite multiple recommendations and discussions  with physical therapy team -Does not qualify for home health PT,  discussed with family the need for ongoing increase physical therapy and activity at home as tolerated   Stage II sacral decubitus ulcer, POA - Wound care consulted and following -continue to follow outpatient   Depression/anxiety - Wellbutrin 150 mg p.o. daily - Venlafaxine 150 mg p.o. daily - Alprazolam 1 mg p.o. 3 times daily as needed anxiety   Hypothyroidism - TSH - Levothyroxine 150 mcg p.o. daily   HLD: Atorvastatin 10 mg p.o. daily   GERD: Continue PPI  Discharge Instructions   Allergies as of 06/23/2021   No Known Allergies      Medication List     TAKE these medications    ALPRAZolam 1 MG tablet Commonly known as: XANAX Take 1 mg by mouth 3 (three) times daily.   buPROPion 150 MG 24 hr tablet Commonly known as: WELLBUTRIN XL Take 150 mg by mouth every evening.   Dexilant 60 MG capsule Generic drug: dexlansoprazole Take 60 mg by mouth daily.   doxycycline 100 MG tablet Commonly known as: VIBRA-TABS Take 1 tablet (100 mg total) by mouth every 12 (twelve) hours for 10 days.   gabapentin 300 MG capsule Commonly known as: NEURONTIN Take 300 mg by mouth 3 (three) times daily.   ipratropium-albuterol 0.5-2.5 (3) MG/3ML Soln Commonly known as: DUONEB use 1 vial by nebulization every 4 (four) hours as needed.   levothyroxine 150 MCG tablet Commonly known as: SYNTHROID Take 150 mcg by mouth daily before breakfast.   liver oil-zinc oxide 40 % ointment Commonly known as: DESITIN Apply topically 4 (four) times daily.   oxyCODONE-acetaminophen 5-325 MG tablet Commonly known as: PERCOCET/ROXICET Take 1 tablet by mouth in the morning, at noon, and at bedtime.   Symbicort 160-4.5 MCG/ACT inhaler Generic drug: budesonide-formoterol Inhale 2 puffs into the lungs 2 (two) times daily.   VITAMIN B-12 PO Take 1 tablet by mouth daily.   VITAMIN D PO Take 1 tablet by mouth daily.               Durable Medical Equipment  (From admission, onward)            Start     Ordered   06/22/21 1544  For home use only DME 3 n 1  Once        06/22/21 1543            No Known Allergies  Consultations: None   Procedures/Studies: DG Tibia/Fibula Left  Result Date: 06/16/2021 CLINICAL DATA:  Lower extremity edema EXAM: LEFT TIBIA AND FIBULA - 2 VIEW COMPARISON:  None. FINDINGS: There is no evidence of fracture or other focal bone lesions. No erosion or periosteal elevation. Diffusely edematous appearance of the soft tissues. No soft tissue gas. IMPRESSION: Edematous appearance of the soft tissues of the left lower extremity. No soft tissue gas. No radiographic evidence of osteomyelitis. Electronically Signed   By: Davina Poke D.O.   On: 06/16/2021 13:27   DG Tibia/Fibula Right  Result Date: 06/16/2021 CLINICAL DATA:  Fall today with inability to get up. Diffuse weeping edema in both legs with decubitus ulcers. Questionable sepsis. EXAM: RIGHT TIBIA AND FIBULA - 2 VIEW COMPARISON:  None. FINDINGS: The bones appear adequately mineralized. There is no evidence of acute fracture or dislocation. There is no evidence of bone destruction to suggest osteomyelitis. Advanced degenerative changes are present at the right knee with tricompartmental joint space narrowing and lateral subluxation. There is generalized  soft tissue edema without evidence of foreign body or soft tissue emphysema. IMPRESSION: No acute osseous findings or evidence of osteomyelitis. Nonspecific generalized soft tissue edema without foreign body. Advanced right knee osteoarthritis. Electronically Signed   By: Richardean Sale M.D.   On: 06/16/2021 13:28   CT Head Wo Contrast  Result Date: 06/16/2021 CLINICAL DATA:  Status post fall. EXAM: CT HEAD WITHOUT CONTRAST TECHNIQUE: Contiguous axial images were obtained from the base of the skull through the vertex without intravenous contrast. COMPARISON:  None. FINDINGS: Brain: There is mild cerebral atrophy with widening of the  extra-axial spaces and ventricular dilatation. There are areas of decreased attenuation within the white matter tracts of the supratentorial brain, consistent with microvascular disease changes. Vascular: No hyperdense vessel or unexpected calcification. Skull: Normal. Negative for fracture or focal lesion. Sinuses/Orbits: There is marked severity right maxillary sinus mucosal thickening. Mild left maxillary sinus and right ethmoid sinus mucosal thickening is seen. A small left maxillary sinus air-fluid level is also present. Other: None. IMPRESSION: 1. Mild cerebral atrophy. 2. No acute intracranial abnormality. 3. Paranasal sinus disease, as described above. Electronically Signed   By: Virgina Norfolk M.D.   On: 06/16/2021 16:08   CT Cervical Spine Wo Contrast  Result Date: 06/16/2021 CLINICAL DATA:  Status post fall. EXAM: CT CERVICAL SPINE WITHOUT CONTRAST TECHNIQUE: Multidetector CT imaging of the cervical spine was performed without intravenous contrast. Multiplanar CT image reconstructions were also generated. COMPARISON:  None. FINDINGS: Alignment: Normal. Skull base and vertebrae: No acute fracture. No primary bone lesion or focal pathologic process. Soft tissues and spinal canal: No prevertebral fluid or swelling. No visible canal hematoma. Disc levels: Moderate severity endplate sclerosis and osteophyte formation are seen at the levels of C4-C5, C5-C6 and C6-C7. There is moderate severity narrowing of the anterior atlantoaxial articulation. Mild intervertebral disc space narrowing is seen at C4-C5, C5-C6 and C6-C7. Bilateral mild-to-moderate severity multilevel facet joint hypertrophy is noted. Upper chest: Negative. Other: None. IMPRESSION: 1. Moderate severity degenerative changes of the cervical spine, as described above. 2. No evidence of an acute fracture or subluxation. Electronically Signed   By: Virgina Norfolk M.D.   On: 06/16/2021 16:01   CT THORACIC SPINE WO CONTRAST  Result Date:  06/16/2021 CLINICAL DATA:  Back pain.  Fall. EXAM: CT THORACIC SPINE WITHOUT CONTRAST TECHNIQUE: Multidetector CT images of the thoracic spine were obtained using the standard protocol without intravenous contrast. COMPARISON:  CTA chest 02/28/2021 FINDINGS: Alignment: Normal. Vertebrae: No acute fracture or destructive osseous process in the thoracic spine. Largely bridging vertebral osteophytes throughout the thoracic spine. Paraspinal and other soft tissues: No acute abnormality identified in the paraspinal soft tissues. Aortic and coronary atherosclerosis. Unchanged scarring or atelectasis in the lingula. Minimal dependent atelectasis in the right lower lobe. Disc levels: Spondylosis without evidence of high-grade spinal canal stenosis on this non myelographic examination. Scattered thoracic facet arthrosis including asymmetric right-sided facet spurring and ligamentum flavum calcification on the right at T12-L1 resulting an right lateral recess and medial right neural foraminal stenosis. IMPRESSION: 1. No acute osseous abnormality in the thoracic spine. 2. Diffuse thoracic spondylosis without evidence of high-grade spinal canal stenosis. 3. Aortic Atherosclerosis (ICD10-I70.0). Electronically Signed   By: Logan Bores M.D.   On: 06/16/2021 16:17   CT ABDOMEN PELVIS W CONTRAST  Result Date: 06/16/2021 CLINICAL DATA:  Status post fall. EXAM: CT ABDOMEN AND PELVIS WITH CONTRAST TECHNIQUE: Multidetector CT imaging of the abdomen and pelvis was performed using the standard protocol  following bolus administration of intravenous contrast. CONTRAST:  125mL OMNIPAQUE IOHEXOL 350 MG/ML SOLN COMPARISON:  April 04, 2018 FINDINGS: Lower chest: No acute abnormality. Hepatobiliary: No focal liver abnormality is seen. Status post cholecystectomy. No biliary dilatation. Pancreas: Unremarkable. No pancreatic ductal dilatation or surrounding inflammatory changes. Spleen: Normal in size without focal abnormality.  Adrenals/Urinary Tract: Adrenal glands are unremarkable. Kidneys are normal, without renal calculi, focal lesion, or hydronephrosis. Bladder is unremarkable. Stomach/Bowel: Stomach is within normal limits. Appendix appears normal. No evidence of bowel wall thickening, distention, or inflammatory changes. Vascular/Lymphatic: Aortic atherosclerosis. No enlarged abdominal or pelvic lymph nodes. Reproductive: Status post hysterectomy. No adnexal masses. Other: No abdominal wall hernia or abnormality. No abdominopelvic ascites. Musculoskeletal: No acute osseous abnormalities are identified. Multilevel marked severity degenerative changes are seen at the levels of L3-L4, L4-L5 and L5-S1. IMPRESSION: 1. No evidence of acute or active process within the abdomen or pelvis. 2. Evidence of prior cholecystectomy. 3. Aortic atherosclerosis. 4. Degenerative changes within the mid and lower lumbar spine. Aortic Atherosclerosis (ICD10-I70.0). Electronically Signed   By: Virgina Norfolk M.D.   On: 06/16/2021 16:06   CT L-SPINE NO CHARGE  Result Date: 06/16/2021 CLINICAL DATA:  Back pain.  Fall. EXAM: CT LUMBAR SPINE WITH CONTRAST TECHNIQUE: Technique: Multiplanar CT images of the lumbar spine were reconstructed from contemporary CT of the Abdomen and Pelvis. CONTRAST:  No additional COMPARISON:  CT abdomen and pelvis 04/04/2018 FINDINGS: Segmentation: Transitional lumbosacral anatomy with partial lumbarization of S1. Alignment: Mild lumbar levoscoliosis. Straightening of the normal lumbar lordosis. No significant listhesis in the sagittal plane. Vertebrae: No acute fracture or suspicious osseous lesion. Advanced disc space narrowing and prominent degenerative endplate sclerosis and vacuum disc from L3-4 to L5-S1. Bulky anterolateral vertebral osteophytes throughout the lumbar spine greater than right. Paraspinal and other soft tissues: No acute abnormality identified in the paraspinal soft tissues. Asymmetric right psoas  muscle atrophy. Intra-abdominal and pelvic contents reported separately. Disc levels: Disc bulging, endplate spurring, and severe facet hypertrophy result in spinal stenosis from L2-3 to L5-S1 which appears severe at L3-4 and L4-5. There is widespread neural foraminal stenosis which appears severe on the right at L3-4, moderate on the left at L4-5, and moderate bilaterally at L5-S1. IMPRESSION: 1. No acute osseous abnormality in the lumbar spine. 2. Advanced lumbar disc and facet degeneration with severe spinal stenosis at L3-4 and L4-5. 3. Widespread neural foraminal stenosis, severe on the right at L3-4. Electronically Signed   By: Logan Bores M.D.   On: 06/16/2021 16:24   DG Chest Port 1 View  Result Date: 06/16/2021 CLINICAL DATA:  Fall EXAM: PORTABLE CHEST 1 VIEW COMPARISON:  None. FINDINGS: The heart size and mediastinal contours are within normal limits. Both lungs are clear. The visualized skeletal structures are unremarkable. IMPRESSION: No acute cardiopulmonary abnormality. Electronically Signed   By: Yetta Glassman M.D.   On: 06/16/2021 13:23   DG Foot Complete Left  Result Date: 06/16/2021 CLINICAL DATA:  Fall today with inability to get up. Diffuse weeping edema in both legs with decubitus ulcers. Questionable sepsis. EXAM: LEFT FOOT - COMPLETE 3+ VIEW COMPARISON:  None. FINDINGS: The bones appear adequately mineralized. No evidence of acute fracture, dislocation or bone destruction. There is smooth cortical thickening of the 2nd and 4th metatarsal shafts. There are advanced degenerative changes of the 1st metatarsophalangeal joint with joint space narrowing and osteophytes. Or prominent dorsal osteophytes are present at the Lisfranc joint. There is mild generalized soft tissue edema without evidence of  foreign body or soft tissue emphysema. IMPRESSION: No acute osseous findings or radiographic evidence of osteomyelitis. Nonspecific soft tissue swelling without evidence of foreign body.  Electronically Signed   By: Carey Bullocks M.D.   On: 06/16/2021 13:27   DG Foot Complete Right  Result Date: 06/16/2021 CLINICAL DATA:  Sepsis. EXAM: RIGHT FOOT COMPLETE - 3+ VIEW COMPARISON:  None. FINDINGS: The joint spaces are maintained. No acute bony findings or destructive bony changes to suggest osteomyelitis. IMPRESSION: No acute bony findings. Electronically Signed   By: Rudie Meyer M.D.   On: 06/16/2021 13:35     Subjective: No acute issues or events overnight denies nausea vomiting diarrhea constipation headache fevers chills or chest pain   Discharge Exam: Vitals:   06/22/21 2150 06/23/21 0611  BP: (!) 100/47 (!) 103/50  Pulse: 87 77  Resp: 18 18  Temp:  98 F (36.7 C)  SpO2: 95%    Vitals:   06/22/21 0745 06/22/21 1554 06/22/21 2150 06/23/21 0611  BP:  (!) 116/59 (!) 100/47 (!) 103/50  Pulse:  92 87 77  Resp:  18 18 18   Temp:  97.8 F (36.6 C)  98 F (36.7 C)  TempSrc:  Oral  Oral  SpO2: 90% 95% 95%   Weight:      Height:        General:  Pleasantly resting in bed, No acute distress. HEENT:  Normocephalic atraumatic.  Sclerae nonicteric, noninjected.  Extraocular movements intact bilaterally. Neck:  Without mass or deformity.  Trachea is midline. Lungs: Diminished bilaterally, end expiratory wheeze without overt rhonchi or rales Heart:  Regular rate and rhythm.  Without murmurs, rubs, or gallops. Abdomen:  Soft, nontender, nondistended.  Without guarding or rebound. Extremities: Without cyanosis, clubbing, 1+ edema Vascular:  Dorsalis pedis and posterior tibial pulses palpable bilaterally. Skin: Sacral decubitus ulcer as above, bilateral lower extremity bandages clean dry intact -previous skin evaluation as below Psych: Alert and oriented x3    The results of significant diagnostics from this hospitalization (including imaging, microbiology, ancillary and laboratory) are listed below for reference.     Microbiology: Recent Results (from the past  240 hour(s))  Resp Panel by RT-PCR (Flu A&B, Covid) Nasopharyngeal Swab     Status: None   Collection Time: 06/16/21 12:21 PM   Specimen: Nasopharyngeal Swab; Nasopharyngeal(NP) swabs in vial transport medium  Result Value Ref Range Status   SARS Coronavirus 2 by RT PCR NEGATIVE NEGATIVE Final    Comment: (NOTE) SARS-CoV-2 target nucleic acids are NOT DETECTED.  The SARS-CoV-2 RNA is generally detectable in upper respiratory specimens during the acute phase of infection. The lowest concentration of SARS-CoV-2 viral copies this assay can detect is 138 copies/mL. A negative result does not preclude SARS-Cov-2 infection and should not be used as the sole basis for treatment or other patient management decisions. A negative result may occur with  improper specimen collection/handling, submission of specimen other than nasopharyngeal swab, presence of viral mutation(s) within the areas targeted by this assay, and inadequate number of viral copies(<138 copies/mL). A negative result must be combined with clinical observations, patient history, and epidemiological information. The expected result is Negative.  Fact Sheet for Patients:  06/18/21  Fact Sheet for Healthcare Providers:  BloggerCourse.com  This test is no t yet approved or cleared by the [employer] FDA and  has been authorized for detection and/or diagnosis of SARS-CoV-2 by FDA under an Emergency Use Authorization (EUA). This EUA will remain  in effect (meaning this  test can be used) for the duration of the COVID-19 declaration under Section 564(b)(1) of the Act, 21 U.S.C.section 360bbb-3(b)(1), unless the authorization is terminated  or revoked sooner.       Influenza A by PCR NEGATIVE NEGATIVE Final   Influenza B by PCR NEGATIVE NEGATIVE Final    Comment: (NOTE) The Xpert Xpress SARS-CoV-2/FLU/RSV plus assay is intended as an aid in the diagnosis of influenza  from Nasopharyngeal swab specimens and should not be used as a sole basis for treatment. Nasal washings and aspirates are unacceptable for Xpert Xpress SARS-CoV-2/FLU/RSV testing.  Fact Sheet for Patients: EntrepreneurPulse.com.au  Fact Sheet for Healthcare Providers: IncredibleEmployment.be  This test is not yet approved or cleared by the [place] FDA and has been authorized for detection and/or diagnosis of SARS-CoV-2 by FDA under an Emergency Use Authorization (EUA). This EUA will remain in effect (meaning this test can be used) for the duration of the COVID-19 declaration under Section 564(b)(1) of the Act, 21 U.S.C. section 360bbb-3(b)(1), unless the authorization is terminated or revoked.  Performed at [facility], [place] [address] [place], [place] [address]   Urine Culture     Status: None   Collection Time: 06/16/21 12:21 PM   Specimen: In/Out Cath Urine  Result Value Ref Range Status   Specimen Description   Final    IN/OUT CATH URINE Performed at [facility] [address] [place], [place] [address]    Special Requests   Final    NONE Performed at [facility] [place], [place] [address] [place], [place] [address]    Culture   Final    NO GROWTH Performed at [facility], [place] [address], [place], [place] [address]    Report Status 06/18/2021 FINAL  Final  Blood Culture (routine x 2)     Status: None   Collection Time: 06/16/21 12:30 PM   Specimen: BLOOD  Result Value Ref Range Status   Specimen Description   Final    BLOOD RIGHT ANTECUBITAL Performed at [facility] [address] [place], [place] [address]    Special Requests   Final    BOTTLES DRAWN AEROBIC ONLY Blood Culture adequate volume Performed at [facility] [address], [place], [place] [address]    Culture   Final    NO GROWTH 5 DAYS Performed at [facility], [place] [address] [place], [place] [address]    Report Status 06/21/2021 FINAL  Final  Blood Culture (routine x 2)     Status: None   Collection Time: 06/16/21 12:30 PM   Specimen: BLOOD  Result Value Ref Range Status   Specimen Description   Final    BLOOD LEFT ARM Performed at [facility] [address], [place], [place] [address]    Special Requests   Final    BOTTLES DRAWN AEROBIC ONLY Blood Culture results may not be optimal due to an inadequate volume of blood received in culture bottles Performed at [place] [address], [place],  [address]    Culture   Final    NO GROWTH 5 DAYS Performed at [facility], [place] [address], [place],  [address]    Report Status 06/21/2021 FINAL  Final     Labs: BNP (last 3 results) Recent Labs    02/28/21 1325 06/16/21 1232  BNP 58.9 [id number]   Basic Metabolic Panel: Recent Labs  Lab  06/17/21 0445 06/18/21 0400 06/19/21 0353 06/20/21 0416 06/21/21 0437 06/22/21 0432 06/23/21 0523  NA 138 134* 137 136 137 135  --   K 3.0* 3.6 3.7 3.9 3.6 3.6  --   CL 101 100 101 100 100 97*  --   CO2 33* 29 33* 31 30 33*  --   GLUCOSE 91 141* 124* 139* 102* 95  --   BUN 10 10 13 14 16 15   --   CREATININE 0.68 0.68 0.63 0.71 0.82 0.66 0.67  CALCIUM 8.0* 7.8* 8.0* 7.8* 8.0* 8.0*  --   MG 2.0  --   --   --   --   --   --    Liver Function Tests: Recent Labs  Lab 06/16/21 1230 06/17/21 0445  AST 22 19  ALT 16 15  ALKPHOS 119 107  BILITOT 0.6 0.7  PROT 7.8 6.5  ALBUMIN 2.1* 1.9*   Recent Labs  Lab 06/16/21 1230  LIPASE 25   No results for input(s): AMMONIA in the last 168 hours. CBC: Recent Labs  Lab 06/16/21 1255 06/17/21 0445 06/18/21 0400 06/19/21 0353 06/20/21 0416 06/21/21 0437 06/22/21 0432  WBC 13.1*   < > 8.5 6.6 7.9 9.1 8.2  NEUTROABS 10.5*  --   --   --   --   --   --   HGB 10.4*   < > 9.2* 8.1* 8.1* 8.2* 8.5*  HCT 36.2   < > 32.1* 28.2*  28.3* 28.3* 28.6*  MCV 83.6   < > 85.6 84.4 82.3 83.0 83.1  PLT 561*   < > 480* 404* 401* 375 446*   < > = values in this interval not displayed.   Cardiac Enzymes: No results for input(s): CKTOTAL, CKMB, CKMBINDEX, TROPONINI in the last 168 hours. BNP: Invalid input(s): POCBNP CBG: No results for input(s): GLUCAP in the last 168 hours. D-Dimer No results for input(s): DDIMER in the last 72 hours. Hgb A1c No results for input(s): HGBA1C in the last 72 hours. Lipid Profile No results for input(s): CHOL, HDL, LDLCALC, TRIG, CHOLHDL, LDLDIRECT in the last 72 hours. Thyroid function studies No results for input(s): TSH, T4TOTAL, T3FREE, THYROIDAB in the last 72 hours.  Invalid input(s): FREET3 Anemia work up No results for input(s): VITAMINB12, FOLATE, FERRITIN, TIBC, IRON, RETICCTPCT in the last 72 hours. Urinalysis    Component Value Date/Time   COLORURINE YELLOW 06/16/2021 1254   APPEARANCEUR TURBID (A) 06/16/2021 1254   LABSPEC >1.046 (H) 06/16/2021 1254   PHURINE 5.0 06/16/2021 1254   GLUCOSEU NEGATIVE 06/16/2021 1254   HGBUR NEGATIVE 06/16/2021 1254   BILIRUBINUR NEGATIVE 06/16/2021 1254   KETONESUR NEGATIVE 06/16/2021 1254   PROTEINUR NEGATIVE 06/16/2021 1254   UROBILINOGEN 0.2 04/14/2011 1651   NITRITE NEGATIVE 06/16/2021 1254   LEUKOCYTESUR NEGATIVE 06/16/2021 1254   Sepsis Labs Invalid input(s): PROCALCITONIN,  WBC,  LACTICIDVEN Microbiology Recent Results (from the past 240 hour(s))  Resp Panel by RT-PCR (Flu A&B, Covid) Nasopharyngeal Swab     Status: None   Collection Time: 06/16/21 12:21 PM   Specimen: Nasopharyngeal Swab; Nasopharyngeal(NP) swabs in vial transport medium  Result Value Ref Range Status   SARS Coronavirus 2 by RT PCR NEGATIVE NEGATIVE Final    Comment: (NOTE) SARS-CoV-2 target nucleic acids are NOT DETECTED.  The SARS-CoV-2 RNA is generally detectable in upper respiratory specimens during the acute phase of infection. The  lowest concentration of SARS-CoV-2 viral copies this assay can detect is 138 copies/mL. A negative  result does not preclude SARS-Cov-2 infection and should not be used as the sole basis for treatment or other patient management decisions. A negative result may occur with  improper specimen collection/handling, submission of specimen other than nasopharyngeal swab, presence of viral mutation(s) within the areas targeted by this assay, and inadequate number of viral copies(<138 copies/mL). A negative result must be combined with clinical observations, patient history, and epidemiological information. The expected result is Negative.  Fact Sheet for Patients:  EntrepreneurPulse.com.au  Fact Sheet for Healthcare Providers:  IncredibleEmployment.be  This test is no t yet approved or cleared by the [place] FDA and  has been authorized for detection and/or diagnosis of SARS-CoV-2 by FDA under an Emergency Use Authorization (EUA). This EUA will remain  in effect (meaning this test can be used) for the duration of the COVID-19 declaration under Section 564(b)(1) of the Act, 21 U.S.C.section 360bbb-3(b)(1), unless the authorization is terminated  or revoked sooner.       Influenza A by PCR NEGATIVE NEGATIVE Final   Influenza B by PCR NEGATIVE NEGATIVE Final    Comment: (NOTE) The Xpert Xpress SARS-CoV-2/FLU/RSV plus assay is intended as an aid in the diagnosis of influenza from Nasopharyngeal swab specimens and should not be used as a sole basis for treatment. Nasal washings and aspirates are unacceptable for Xpert Xpress SARS-CoV-2/FLU/RSV testing.  Fact Sheet for Patients: EntrepreneurPulse.com.au  Fact Sheet for Healthcare Providers: IncredibleEmployment.be  This test is not yet approved or cleared by the [place] FDA and has been authorized for detection and/or diagnosis of SARS-CoV-2 by FDA under  an Emergency Use Authorization (EUA). This EUA will remain in effect (meaning this test can be used) for the duration of the COVID-19 declaration under Section 564(b)(1) of the Act, 21 U.S.C. section 360bbb-3(b)(1), unless the authorization is terminated or revoked.  Performed at [facility], [place] [address] [place], [place] [address]   Urine Culture     Status: None   Collection Time: 06/16/21 12:21 PM   Specimen: In/Out Cath Urine  Result Value Ref Range Status   Specimen Description   Final    IN/OUT CATH URINE Performed at [facility] [address], [place], [place] [address]    Special Requests   Final    NONE Performed at [facility], [place] [address], [place], [place] [address]    Culture   Final    NO GROWTH Performed at [facility], [place] [address] [place], [place] [address]    Report Status 06/18/2021 FINAL  Final  Blood Culture (routine x 2)     Status: None   Collection Time: 06/16/21 12:30 PM   Specimen: BLOOD  Result Value Ref Range Status   Specimen Description   Final    BLOOD RIGHT ANTECUBITAL Performed at [facility] [address] [place], [place] [address]    Special Requests   Final    BOTTLES DRAWN AEROBIC ONLY Blood Culture adequate volume Performed at [facility] [address] [place], [place], [place] [address]    Culture   Final    NO GROWTH 5 DAYS Performed at [facility], [place] [address], [place], [place] [address]    Report Status 06/21/2021 FINAL  Final  Blood Culture (routine x 2)     Status: None   Collection Time: 06/16/21 12:30 PM   Specimen: BLOOD  Result Value Ref Range Status   Specimen Description  Final    BLOOD LEFT ARM Performed at [facility] [address] [place], [place] [address]    Special Requests   Final    BOTTLES DRAWN AEROBIC ONLY Blood Culture results may not be optimal due to an  inadequate volume of blood received in culture bottles Performed at [facility] [address], [place], [place] [address]    Culture   Final    NO GROWTH 5 DAYS Performed at [facility], [place] [address] [place], [place] [address]    Report Status 06/21/2021 FINAL  Final     Time coordinating discharge: Over 30 minutes  SIGNED:   Little Ishikawa, DO [facility] 06/23/2021, 7:24 AM Pager   If 7PM-7AM, please contact night-coverage [email or web address]

## 2021-06-29 ENCOUNTER — Encounter (HOSPITAL_BASED_OUTPATIENT_CLINIC_OR_DEPARTMENT_OTHER): Payer: Medicaid Other | Attending: Internal Medicine | Admitting: Internal Medicine

## 2021-07-06 ENCOUNTER — Encounter (HOSPITAL_BASED_OUTPATIENT_CLINIC_OR_DEPARTMENT_OTHER): Payer: Medicaid Other | Admitting: Internal Medicine

## 2021-08-06 ENCOUNTER — Encounter (HOSPITAL_BASED_OUTPATIENT_CLINIC_OR_DEPARTMENT_OTHER): Payer: Medicare Other | Attending: Internal Medicine | Admitting: Internal Medicine

## 2021-08-14 ENCOUNTER — Inpatient Hospital Stay (HOSPITAL_COMMUNITY)
Admission: EM | Admit: 2021-08-14 | Discharge: 2021-09-04 | DRG: 871 | Disposition: A | Discharge status: Home Health | Payer: Medicaid Other | Attending: Family Medicine | Admitting: Family Medicine

## 2021-08-14 ENCOUNTER — Other Ambulatory Visit: Payer: Self-pay

## 2021-08-14 ENCOUNTER — Emergency Department (HOSPITAL_COMMUNITY): Payer: Medicaid Other

## 2021-08-14 ENCOUNTER — Encounter (HOSPITAL_COMMUNITY): Payer: Self-pay

## 2021-08-14 DIAGNOSIS — F1120 Opioid dependence, uncomplicated: Secondary | ICD-10-CM | POA: Diagnosis present

## 2021-08-14 DIAGNOSIS — L8990 Pressure ulcer of unspecified site, unspecified stage: Secondary | ICD-10-CM | POA: Insufficient documentation

## 2021-08-14 DIAGNOSIS — L89312 Pressure ulcer of right buttock, stage 2: Secondary | ICD-10-CM | POA: Diagnosis present

## 2021-08-14 DIAGNOSIS — N179 Acute kidney failure, unspecified: Secondary | ICD-10-CM | POA: Diagnosis present

## 2021-08-14 DIAGNOSIS — R739 Hyperglycemia, unspecified: Secondary | ICD-10-CM | POA: Diagnosis not present

## 2021-08-14 DIAGNOSIS — E860 Dehydration: Secondary | ICD-10-CM | POA: Diagnosis present

## 2021-08-14 DIAGNOSIS — R6521 Severe sepsis with septic shock: Secondary | ICD-10-CM | POA: Diagnosis present

## 2021-08-14 DIAGNOSIS — R64 Cachexia: Secondary | ICD-10-CM | POA: Diagnosis present

## 2021-08-14 DIAGNOSIS — J9621 Acute and chronic respiratory failure with hypoxia: Secondary | ICD-10-CM | POA: Diagnosis present

## 2021-08-14 DIAGNOSIS — Z66 Do not resuscitate: Secondary | ICD-10-CM | POA: Diagnosis present

## 2021-08-14 DIAGNOSIS — I071 Rheumatic tricuspid insufficiency: Secondary | ICD-10-CM | POA: Diagnosis present

## 2021-08-14 DIAGNOSIS — Z881 Allergy status to other antibiotic agents status: Secondary | ICD-10-CM

## 2021-08-14 DIAGNOSIS — L89152 Pressure ulcer of sacral region, stage 2: Secondary | ICD-10-CM | POA: Diagnosis present

## 2021-08-14 DIAGNOSIS — A419 Sepsis, unspecified organism: Principal | ICD-10-CM | POA: Diagnosis present

## 2021-08-14 DIAGNOSIS — Z79899 Other long term (current) drug therapy: Secondary | ICD-10-CM

## 2021-08-14 DIAGNOSIS — L89322 Pressure ulcer of left buttock, stage 2: Secondary | ICD-10-CM | POA: Diagnosis present

## 2021-08-14 DIAGNOSIS — K5900 Constipation, unspecified: Secondary | ICD-10-CM | POA: Diagnosis not present

## 2021-08-14 DIAGNOSIS — E8809 Other disorders of plasma-protein metabolism, not elsewhere classified: Secondary | ICD-10-CM | POA: Diagnosis present

## 2021-08-14 DIAGNOSIS — E162 Hypoglycemia, unspecified: Secondary | ICD-10-CM | POA: Diagnosis not present

## 2021-08-14 DIAGNOSIS — E876 Hypokalemia: Secondary | ICD-10-CM | POA: Diagnosis not present

## 2021-08-14 DIAGNOSIS — F419 Anxiety disorder, unspecified: Secondary | ICD-10-CM | POA: Diagnosis present

## 2021-08-14 DIAGNOSIS — F439 Reaction to severe stress, unspecified: Secondary | ICD-10-CM | POA: Diagnosis present

## 2021-08-14 DIAGNOSIS — E039 Hypothyroidism, unspecified: Secondary | ICD-10-CM | POA: Diagnosis present

## 2021-08-14 DIAGNOSIS — Z6835 Body mass index (BMI) 35.0-35.9, adult: Secondary | ICD-10-CM

## 2021-08-14 DIAGNOSIS — E871 Hypo-osmolality and hyponatremia: Secondary | ICD-10-CM | POA: Diagnosis not present

## 2021-08-14 DIAGNOSIS — I872 Venous insufficiency (chronic) (peripheral): Secondary | ICD-10-CM | POA: Diagnosis present

## 2021-08-14 DIAGNOSIS — L03115 Cellulitis of right lower limb: Secondary | ICD-10-CM | POA: Diagnosis present

## 2021-08-14 DIAGNOSIS — F32A Depression, unspecified: Secondary | ICD-10-CM | POA: Diagnosis present

## 2021-08-14 DIAGNOSIS — Z91199 Patient's noncompliance with other medical treatment and regimen due to unspecified reason: Secondary | ICD-10-CM

## 2021-08-14 DIAGNOSIS — I472 Ventricular tachycardia, unspecified: Secondary | ICD-10-CM | POA: Diagnosis not present

## 2021-08-14 DIAGNOSIS — Z20822 Contact with and (suspected) exposure to covid-19: Secondary | ICD-10-CM | POA: Diagnosis present

## 2021-08-14 DIAGNOSIS — F17210 Nicotine dependence, cigarettes, uncomplicated: Secondary | ICD-10-CM | POA: Diagnosis present

## 2021-08-14 DIAGNOSIS — D75838 Other thrombocytosis: Secondary | ICD-10-CM | POA: Diagnosis present

## 2021-08-14 DIAGNOSIS — R54 Age-related physical debility: Secondary | ICD-10-CM | POA: Diagnosis present

## 2021-08-14 DIAGNOSIS — Z888 Allergy status to other drugs, medicaments and biological substances status: Secondary | ICD-10-CM

## 2021-08-14 DIAGNOSIS — L03116 Cellulitis of left lower limb: Secondary | ICD-10-CM | POA: Diagnosis present

## 2021-08-14 DIAGNOSIS — J449 Chronic obstructive pulmonary disease, unspecified: Secondary | ICD-10-CM | POA: Diagnosis present

## 2021-08-14 DIAGNOSIS — Z90710 Acquired absence of both cervix and uterus: Secondary | ICD-10-CM

## 2021-08-14 DIAGNOSIS — R109 Unspecified abdominal pain: Secondary | ICD-10-CM

## 2021-08-14 DIAGNOSIS — E7800 Pure hypercholesterolemia, unspecified: Secondary | ICD-10-CM | POA: Diagnosis present

## 2021-08-14 DIAGNOSIS — K219 Gastro-esophageal reflux disease without esophagitis: Secondary | ICD-10-CM | POA: Diagnosis present

## 2021-08-14 DIAGNOSIS — L89893 Pressure ulcer of other site, stage 3: Secondary | ICD-10-CM | POA: Diagnosis present

## 2021-08-14 DIAGNOSIS — D638 Anemia in other chronic diseases classified elsewhere: Secondary | ICD-10-CM | POA: Diagnosis present

## 2021-08-14 DIAGNOSIS — Z79890 Hormone replacement therapy (postmenopausal): Secondary | ICD-10-CM

## 2021-08-14 DIAGNOSIS — R627 Adult failure to thrive: Secondary | ICD-10-CM | POA: Diagnosis present

## 2021-08-14 DIAGNOSIS — R112 Nausea with vomiting, unspecified: Secondary | ICD-10-CM | POA: Diagnosis not present

## 2021-08-14 DIAGNOSIS — I10 Essential (primary) hypertension: Secondary | ICD-10-CM | POA: Diagnosis present

## 2021-08-14 DIAGNOSIS — G8929 Other chronic pain: Secondary | ICD-10-CM | POA: Diagnosis present

## 2021-08-14 DIAGNOSIS — I878 Other specified disorders of veins: Secondary | ICD-10-CM | POA: Diagnosis present

## 2021-08-14 DIAGNOSIS — Z91018 Allergy to other foods: Secondary | ICD-10-CM

## 2021-08-14 DIAGNOSIS — E43 Unspecified severe protein-calorie malnutrition: Secondary | ICD-10-CM | POA: Diagnosis present

## 2021-08-14 DIAGNOSIS — Z7951 Long term (current) use of inhaled steroids: Secondary | ICD-10-CM

## 2021-08-14 DIAGNOSIS — Z9981 Dependence on supplemental oxygen: Secondary | ICD-10-CM

## 2021-08-14 DIAGNOSIS — L97519 Non-pressure chronic ulcer of other part of right foot with unspecified severity: Secondary | ICD-10-CM | POA: Diagnosis present

## 2021-08-14 MED ORDER — VANCOMYCIN HCL 750 MG/150ML IV SOLN
750.0000 mg | Freq: Once | INTRAVENOUS | Status: DC
  Filled 2021-08-14: qty 150

## 2021-08-14 NOTE — ED Triage Notes (Signed)
Patient BIB [facility]. Was seen a month ago with injury to her right leg. Weeping wound on the back of it. Patient cannot feeling her right leg at all. Generalized weakness.

## 2021-08-14 NOTE — ED Provider Notes (Signed)
[facility] Emergency Department Provider Note MRN:  [id number]  Arrival date & time: 08/15/21     Chief Complaint   Leg Swelling   History of Present Illness   [name] is a 66 y.o. year-old female presents to the ED with chief complaint of foul-smelling wound to right upper leg.  She states that she has an open wound that has been draining pus.  She was staying with a friend, and claims to have been caring for it with her friend, when her symptoms worsened over the past few days.  She also reports increased swelling in her bilateral lower extremities along with increased redness of the lower legs.  She denies any fever, chills, or cough.    Review of Systems  Pertinent review of systems noted in HPI.    Physical Exam   Vitals:   08/15/21 0420 08/15/21 0425  BP: (!) 101/59 101/60  Pulse: 63 (!) 43  Resp: 14 (!) 24  Temp:    SpO2: 96% 97%    CONSTITUTIONAL:  chronically ill-appearing, NAD NEURO:  Alert and oriented x 3, CN 3-12 grossly intact EYES:  eyes equal and reactive ENT/NECK:  Supple, no stridor  CARDIO:  normal, regular rhythm, appears well-perfused  PULM:  No respiratory distress,  GI/GU:  non-distended,  MSK/SPINE:  No gross deformities, no edema, moves all extremities  SKIN: Patient has large ulcerated wound to right upper lateral thigh with significant erythema and foul-smelling purulent discharge         *Additional and/or pertinent findings included in MDM below  Diagnostic and Interventional Summary    EKG Interpretation  Date/Time:    Ventricular Rate:    PR Interval:    QRS Duration:   QT Interval:    QTC Calculation:   R Axis:     Text Interpretation:         Labs Reviewed  CBC - Abnormal; Notable for the following components:      Result Value   WBC 15.5 (*)    Hemoglobin 10.3 (*)    HCT 34.6 (*)    MCH 25.1 (*)    MCHC 29.8 (*)    RDW 21.5 (*)    Platelets 880 (*)    All other components within  normal limits  BASIC METABOLIC PANEL - Abnormal; Notable for the following components:   Sodium 128 (*)    Chloride 93 (*)    All other components within normal limits  LACTIC ACID, PLASMA - Abnormal; Notable for the following components:   Lactic Acid, Venous 2.6 (*)    All other components within normal limits  RESP PANEL BY RT-PCR (FLU A&B, COVID) [id number]  CULTURE, BLOOD (ROUTINE X 2)  CULTURE, BLOOD (ROUTINE X 2)  BRAIN NATRIURETIC PEPTIDE  LACTIC ACID, PLASMA  URINALYSIS, ROUTINE W REFLEX MICROSCOPIC    CT EXTREMITY LOWER RIGHT W CONTRAST  Final Result    DG Chest Port 1 View  Final Result      Medications  lactated ringers infusion ( Intravenous New Bag/Given 08/15/21 0208)  vancomycin (VANCOREADY) IVPB 1250 mg/250 mL (has no administration in time range)  fentaNYL (SUBLIMAZE) injection 50 mcg (0 mcg Intravenous Hold 08/15/21 0420)  norepinephrine (LEVOPHED) 4mg  in 259mL (0.016 mg/mL) premix infusion (6 mcg/min Intravenous Rate/Dose Change 08/15/21 0417)  ceFEPIme (MAXIPIME) 2 g in sodium chloride 0.9 % 100 mL IVPB (has no administration in time range)  fentaNYL (SUBLIMAZE) injection 50 mcg (50 mcg Intravenous Given 08/15/21 0015)  vancomycin (VANCOCIN) IVPB 1000 mg/200 mL premix (0 mg Intravenous Stopped 08/15/21 0150)  lactated ringers bolus 1,000 mL (0 mLs Intravenous Stopped 08/15/21 0342)    And  lactated ringers bolus 1,000 mL (0 mLs Intravenous Stopped 08/15/21 0412)    And  lactated ringers bolus 1,000 mL (0 mLs Intravenous Stopped 08/15/21 0413)  iohexol (OMNIPAQUE) 350 MG/ML injection 80 mL (80 mLs Intravenous Contrast Given 08/15/21 0255)     Procedures  /  Critical Care .Critical Care Performed by: Montine Circle, PA-C Authorized by: Montine Circle, PA-C   Critical care provider statement:    Critical care time (minutes):  65   Critical care was necessary to treat or prevent imminent or life-threatening deterioration of the following conditions:  Sepsis    Critical care was time spent personally by me on the following activities:  Development of treatment plan with patient or surrogate, discussions with consultants, evaluation of patient's response to treatment, examination of patient, ordering and review of laboratory studies, ordering and review of radiographic studies, ordering and performing treatments and interventions, pulse oximetry, re-evaluation of patient's condition and review of old charts  ED Course and Medical Decision Making  I have reviewed the triage vital signs, the nursing notes, and pertinent available records from the EMR.  Complexity of Problems Addressed Acute illness or injury that poses threat of life of bodily function  Additional Data Reviewed and Analyzed Further history obtained from: Past medical history and medications listed in the EMR, Prior ED visit notes, Recent discharge summary, and Records from care facility    ED Course Clinical Course as of 08/15/21 0443  Sat Aug 15, 2021  0139 BP is down trending.  Code sepsis activated.  Will initiate weight based fluids based on hypotension.  Leukocytosis to 15.5.  Lactic still pending. [RB]  [id number] Nursing and lab are trying to find remaining on results of blood work.  Delay in result of lactic acid test. [RB]    Clinical Course User Index [RB] Montine Circle, PA-C    I personally reviewed the labs, CT, and cardiac monitor.  CT scan consistent with severe cellulitis, but no gas or abscess formation.  Patient was given IV fluid boluses, IV antibiotics.  I consulted with the intensivist, who will come to evaluate the patient.   Final Clinical Impressions(s) / ED Diagnoses     ICD-10-CM   1. Sepsis, due to unspecified organism, unspecified whether acute organ dysfunction present ([place])  A41.9     2. Cellulitis of right lower extremity  L03.115       ED Discharge Orders     None        Discharge Instructions Discussed with and Provided to Patient:    Discharge Instructions   None      Montine Circle, PA-C 08/15/21 [place], [place], DO 08/15/21 [id number]

## 2021-08-15 ENCOUNTER — Inpatient Hospital Stay: Payer: Self-pay

## 2021-08-15 ENCOUNTER — Emergency Department (HOSPITAL_COMMUNITY): Payer: Medicaid Other

## 2021-08-15 ENCOUNTER — Encounter (HOSPITAL_COMMUNITY): Payer: Self-pay

## 2021-08-15 ENCOUNTER — Inpatient Hospital Stay (HOSPITAL_COMMUNITY): Payer: Medicaid Other

## 2021-08-15 DIAGNOSIS — J449 Chronic obstructive pulmonary disease, unspecified: Secondary | ICD-10-CM | POA: Diagnosis present

## 2021-08-15 DIAGNOSIS — I5031 Acute diastolic (congestive) heart failure: Secondary | ICD-10-CM

## 2021-08-15 DIAGNOSIS — L03317 Cellulitis of buttock: Secondary | ICD-10-CM | POA: Diagnosis not present

## 2021-08-15 DIAGNOSIS — E871 Hypo-osmolality and hyponatremia: Secondary | ICD-10-CM | POA: Diagnosis not present

## 2021-08-15 DIAGNOSIS — R64 Cachexia: Secondary | ICD-10-CM | POA: Diagnosis present

## 2021-08-15 DIAGNOSIS — I472 Ventricular tachycardia, unspecified: Secondary | ICD-10-CM | POA: Diagnosis not present

## 2021-08-15 DIAGNOSIS — E860 Dehydration: Secondary | ICD-10-CM | POA: Diagnosis present

## 2021-08-15 DIAGNOSIS — L89893 Pressure ulcer of other site, stage 3: Secondary | ICD-10-CM | POA: Diagnosis present

## 2021-08-15 DIAGNOSIS — L03115 Cellulitis of right lower limb: Secondary | ICD-10-CM

## 2021-08-15 DIAGNOSIS — E8809 Other disorders of plasma-protein metabolism, not elsewhere classified: Secondary | ICD-10-CM | POA: Diagnosis present

## 2021-08-15 DIAGNOSIS — Z6835 Body mass index (BMI) 35.0-35.9, adult: Secondary | ICD-10-CM | POA: Diagnosis not present

## 2021-08-15 DIAGNOSIS — L89312 Pressure ulcer of right buttock, stage 2: Secondary | ICD-10-CM | POA: Diagnosis present

## 2021-08-15 DIAGNOSIS — J9601 Acute respiratory failure with hypoxia: Secondary | ICD-10-CM | POA: Diagnosis not present

## 2021-08-15 DIAGNOSIS — L89210 Pressure ulcer of right hip, unstageable: Secondary | ICD-10-CM | POA: Diagnosis not present

## 2021-08-15 DIAGNOSIS — F32A Depression, unspecified: Secondary | ICD-10-CM | POA: Diagnosis present

## 2021-08-15 DIAGNOSIS — Z66 Do not resuscitate: Secondary | ICD-10-CM | POA: Diagnosis present

## 2021-08-15 DIAGNOSIS — L0390 Cellulitis, unspecified: Secondary | ICD-10-CM | POA: Diagnosis not present

## 2021-08-15 DIAGNOSIS — R6521 Severe sepsis with septic shock: Secondary | ICD-10-CM | POA: Diagnosis present

## 2021-08-15 DIAGNOSIS — R739 Hyperglycemia, unspecified: Secondary | ICD-10-CM | POA: Diagnosis not present

## 2021-08-15 DIAGNOSIS — N179 Acute kidney failure, unspecified: Secondary | ICD-10-CM | POA: Diagnosis present

## 2021-08-15 DIAGNOSIS — A419 Sepsis, unspecified organism: Secondary | ICD-10-CM

## 2021-08-15 DIAGNOSIS — J9621 Acute and chronic respiratory failure with hypoxia: Secondary | ICD-10-CM | POA: Diagnosis present

## 2021-08-15 DIAGNOSIS — I10 Essential (primary) hypertension: Secondary | ICD-10-CM | POA: Diagnosis present

## 2021-08-15 DIAGNOSIS — Z20822 Contact with and (suspected) exposure to covid-19: Secondary | ICD-10-CM | POA: Diagnosis present

## 2021-08-15 DIAGNOSIS — F419 Anxiety disorder, unspecified: Secondary | ICD-10-CM | POA: Diagnosis not present

## 2021-08-15 DIAGNOSIS — L8940 Pressure ulcer of contiguous site of back, buttock and hip, unspecified stage: Secondary | ICD-10-CM | POA: Diagnosis not present

## 2021-08-15 DIAGNOSIS — L89152 Pressure ulcer of sacral region, stage 2: Secondary | ICD-10-CM | POA: Diagnosis present

## 2021-08-15 DIAGNOSIS — R109 Unspecified abdominal pain: Secondary | ICD-10-CM | POA: Diagnosis not present

## 2021-08-15 DIAGNOSIS — D638 Anemia in other chronic diseases classified elsewhere: Secondary | ICD-10-CM | POA: Diagnosis present

## 2021-08-15 DIAGNOSIS — I9589 Other hypotension: Secondary | ICD-10-CM | POA: Diagnosis not present

## 2021-08-15 DIAGNOSIS — F1120 Opioid dependence, uncomplicated: Secondary | ICD-10-CM | POA: Diagnosis present

## 2021-08-15 DIAGNOSIS — E039 Hypothyroidism, unspecified: Secondary | ICD-10-CM | POA: Diagnosis present

## 2021-08-15 DIAGNOSIS — I071 Rheumatic tricuspid insufficiency: Secondary | ICD-10-CM | POA: Diagnosis present

## 2021-08-15 DIAGNOSIS — E43 Unspecified severe protein-calorie malnutrition: Secondary | ICD-10-CM | POA: Diagnosis present

## 2021-08-15 DIAGNOSIS — R609 Edema, unspecified: Secondary | ICD-10-CM | POA: Diagnosis not present

## 2021-08-15 LAB — RESP PANEL BY RT-PCR (FLU A&B, COVID) ARPGX2
Influenza A by PCR: NEGATIVE
Influenza B by PCR: NEGATIVE
SARS Coronavirus 2 by RT PCR: NEGATIVE

## 2021-08-15 LAB — ECHOCARDIOGRAM COMPLETE
AR max vel: 1.94 cm2
AV Area VTI: 1.97 cm2
AV Area mean vel: 1.85 cm2
AV Mean grad: 4 mmHg
AV Peak grad: 7.5 mmHg
Ao pk vel: 1.37 m/s
Area-P 1/2: 3.89 cm2
Height: 64 in
S' Lateral: 4.8 cm
Weight: 3040 oz

## 2021-08-15 LAB — URINALYSIS, ROUTINE W REFLEX MICROSCOPIC
Bilirubin Urine: NEGATIVE
Glucose, UA: NEGATIVE mg/dL
Hgb urine dipstick: NEGATIVE
Ketones, ur: NEGATIVE mg/dL
Leukocytes,Ua: NEGATIVE
Nitrite: NEGATIVE
Protein, ur: NEGATIVE mg/dL
Specific Gravity, Urine: 1.019 (ref 1.005–1.030)
pH: 5 (ref 5.0–8.0)

## 2021-08-15 LAB — RAPID URINE DRUG SCREEN, HOSP PERFORMED
Amphetamines: NOT DETECTED
Barbiturates: NOT DETECTED
Benzodiazepines: POSITIVE — AB
Cocaine: NOT DETECTED
Opiates: NOT DETECTED
Tetrahydrocannabinol: POSITIVE — AB

## 2021-08-15 LAB — BLOOD GAS, ARTERIAL
Acid-Base Excess: 0.9 mmol/L (ref 0.0–2.0)
Bicarbonate: 24.5 mmol/L (ref 20.0–28.0)
FIO2: 21
O2 Saturation: 95.6 %
Patient temperature: 98.6
pCO2 arterial: 37.1 mmHg (ref 32.0–48.0)
pH, Arterial: 7.436 (ref 7.350–7.450)
pO2, Arterial: 79.4 mmHg — ABNORMAL LOW (ref 83.0–108.0)

## 2021-08-15 LAB — BASIC METABOLIC PANEL
Anion gap: 13 (ref 5–15)
BUN: 17 mg/dL (ref 8–23)
CO2: 22 mmol/L (ref 22–32)
Calcium: 8.9 mg/dL (ref 8.9–10.3)
Chloride: 93 mmol/L — ABNORMAL LOW (ref 98–111)
Creatinine, Ser: 0.9 mg/dL (ref 0.44–1.00)
GFR, Estimated: >60 mL/min (ref >60)
Glucose, Bld: 86 mg/dL (ref 70–99)
Potassium: 4 mmol/L (ref 3.5–5.1)
Sodium: 128 mmol/L — ABNORMAL LOW (ref 135–145)

## 2021-08-15 LAB — CBC
HCT: 34.6 % — ABNORMAL LOW (ref 36.0–46.0)
HCT: 30.4 % — ABNORMAL LOW (ref 36.0–46.0)
Hemoglobin: 10.3 g/dL — ABNORMAL LOW (ref 12.0–15.0)
Hemoglobin: 9.2 g/dL — ABNORMAL LOW (ref 12.0–15.0)
MCH: 25.1 pg — ABNORMAL LOW (ref 26.0–34.0)
MCH: 25.1 pg — ABNORMAL LOW (ref 26.0–34.0)
MCHC: 29.8 g/dL — ABNORMAL LOW (ref 30.0–36.0)
MCHC: 30.3 g/dL (ref 30.0–36.0)
MCV: 84.4 fL (ref 80.0–100.0)
MCV: 82.8 fL (ref 80.0–100.0)
Platelets: 880 10*3/uL — ABNORMAL HIGH (ref 150–400)
Platelets: 765 10*3/uL — ABNORMAL HIGH (ref 150–400)
RBC: 4.1 MIL/uL (ref 3.87–5.11)
RBC: 3.67 MIL/uL — ABNORMAL LOW (ref 3.87–5.11)
RDW: 21.5 % — ABNORMAL HIGH (ref 11.5–15.5)
RDW: 21.5 % — ABNORMAL HIGH (ref 11.5–15.5)
WBC: 15.5 10*3/uL — ABNORMAL HIGH (ref 4.0–10.5)
WBC: 13.1 10*3/uL — ABNORMAL HIGH (ref 4.0–10.5)
nRBC: 0 % (ref 0.0–0.2)
nRBC: 0 % (ref 0.0–0.2)

## 2021-08-15 LAB — BRAIN NATRIURETIC PEPTIDE: B Natriuretic Peptide: 49.4 pg/mL (ref 0.0–100.0)

## 2021-08-15 LAB — HEMOGLOBIN A1C
Hgb A1c MFr Bld: 5.6 % (ref 4.8–5.6)
Mean Plasma Glucose: 114.02 mg/dL

## 2021-08-15 LAB — CREATININE, SERUM
Creatinine, Ser: 0.7 mg/dL (ref 0.44–1.00)
GFR, Estimated: >60 mL/min (ref >60)

## 2021-08-15 LAB — LACTIC ACID, PLASMA
Lactic Acid, Venous: 2.6 mmol/L (ref 0.5–1.9)
Lactic Acid, Venous: 1.8 mmol/L (ref 0.5–1.9)

## 2021-08-15 LAB — GLUCOSE, CAPILLARY
Glucose-Capillary: 149 mg/dL — ABNORMAL HIGH (ref 70–99)
Glucose-Capillary: 114 mg/dL — ABNORMAL HIGH (ref 70–99)
Glucose-Capillary: 181 mg/dL — ABNORMAL HIGH (ref 70–99)

## 2021-08-15 LAB — MRSA NEXT GEN BY PCR, NASAL: MRSA by PCR Next Gen: DETECTED — AB

## 2021-08-15 LAB — MAGNESIUM: Magnesium: 2.4 mg/dL (ref 1.7–2.4)

## 2021-08-15 MED ORDER — ENOXAPARIN SODIUM 40 MG/0.4ML IJ SOSY
40.0000 mg | PREFILLED_SYRINGE | INTRAMUSCULAR | Status: DC
  Administered 2021-08-15 – 2021-09-04 (×21): 40 mg via SUBCUTANEOUS
  Filled 2021-08-15 (×21): qty 0.4

## 2021-08-15 MED ORDER — METRONIDAZOLE 500 MG/100ML IV SOLN
500.0000 mg | Freq: Two times a day (BID) | INTRAVENOUS | Status: DC
  Administered 2021-08-15: 500 mg via INTRAVENOUS
  Filled 2021-08-15: qty 100

## 2021-08-15 MED ORDER — MIDODRINE HCL 5 MG PO TABS
10.0000 mg | ORAL_TABLET | Freq: Three times a day (TID) | ORAL | Status: DC
  Administered 2021-08-15 – 2021-08-19 (×11): 10 mg via ORAL
  Filled 2021-08-15 (×11): qty 2

## 2021-08-15 MED ORDER — ORAL CARE MOUTH RINSE
15.0000 mL | Freq: Two times a day (BID) | OROMUCOSAL | Status: DC
  Administered 2021-08-15 – 2021-09-04 (×42): 15 mL via OROMUCOSAL

## 2021-08-15 MED ORDER — INSULIN ASPART 100 UNIT/ML IJ SOLN
0.0000 [IU] | INTRAMUSCULAR | Status: DC
  Administered 2021-08-15 – 2021-08-16 (×2): 1 [IU] via SUBCUTANEOUS
  Administered 2021-08-16: 3 [IU] via SUBCUTANEOUS
  Administered 2021-08-16: 1 [IU] via SUBCUTANEOUS
  Administered 2021-08-16 – 2021-08-17 (×8): 2 [IU] via SUBCUTANEOUS
  Administered 2021-08-18: 1 [IU] via SUBCUTANEOUS
  Administered 2021-08-18: 2 [IU] via SUBCUTANEOUS
  Administered 2021-08-18: 1 [IU] via SUBCUTANEOUS
  Administered 2021-08-18 – 2021-08-19 (×3): 2 [IU] via SUBCUTANEOUS
  Administered 2021-08-19: 3 [IU] via SUBCUTANEOUS
  Administered 2021-08-19: 1 [IU] via SUBCUTANEOUS
  Administered 2021-08-19 – 2021-08-20 (×3): 2 [IU] via SUBCUTANEOUS
  Administered 2021-08-20: 1 [IU] via SUBCUTANEOUS
  Administered 2021-08-20: 2 [IU] via SUBCUTANEOUS
  Administered 2021-08-20: 1 [IU] via SUBCUTANEOUS
  Administered 2021-08-20 – 2021-08-21 (×4): 2 [IU] via SUBCUTANEOUS
  Administered 2021-08-21: 1 [IU] via SUBCUTANEOUS
  Administered 2021-08-21 – 2021-08-22 (×4): 2 [IU] via SUBCUTANEOUS
  Administered 2021-08-22 (×2): 1 [IU] via SUBCUTANEOUS
  Administered 2021-08-22 – 2021-08-24 (×7): 2 [IU] via SUBCUTANEOUS
  Administered 2021-08-24: 1 [IU] via SUBCUTANEOUS
  Administered 2021-08-24: 2 [IU] via SUBCUTANEOUS

## 2021-08-15 MED ORDER — LACTATED RINGERS IV BOLUS
1000.0000 mL | Freq: Once | INTRAVENOUS | Status: AC
  Administered 2021-08-15: 1000 mL via INTRAVENOUS

## 2021-08-15 MED ORDER — IOHEXOL 350 MG/ML SOLN
80.0000 mL | Freq: Once | INTRAVENOUS | Status: AC | PRN
  Administered 2021-08-15: 80 mL via INTRAVENOUS

## 2021-08-15 MED ORDER — IPRATROPIUM-ALBUTEROL 0.5-2.5 (3) MG/3ML IN SOLN
3.0000 mL | Freq: Four times a day (QID) | RESPIRATORY_TRACT | Status: DC
  Administered 2021-08-15 (×2): 3 mL via RESPIRATORY_TRACT
  Filled 2021-08-15 (×2): qty 3

## 2021-08-15 MED ORDER — PANTOPRAZOLE SODIUM 40 MG PO TBEC
40.0000 mg | DELAYED_RELEASE_TABLET | Freq: Every day | ORAL | Status: DC
  Administered 2021-08-15 – 2021-09-04 (×21): 40 mg via ORAL
  Filled 2021-08-15 (×21): qty 1

## 2021-08-15 MED ORDER — FENTANYL CITRATE (PF) 100 MCG/2ML IJ SOLN
12.5000 ug | INTRAMUSCULAR | Status: DC | PRN
  Administered 2021-08-15: 12.5 ug via INTRAVENOUS
  Administered 2021-08-16 – 2021-08-18 (×15): 50 ug via INTRAVENOUS
  Filled 2021-08-15 (×16): qty 2

## 2021-08-15 MED ORDER — LACTATED RINGERS IV BOLUS (SEPSIS)
1000.0000 mL | Freq: Once | INTRAVENOUS | Status: AC
  Administered 2021-08-15: 1000 mL via INTRAVENOUS

## 2021-08-15 MED ORDER — SODIUM CHLORIDE 0.9 % IV SOLN
250.0000 mL | INTRAVENOUS | Status: DC
  Administered 2021-08-16 – 2021-08-26 (×3): 250 mL via INTRAVENOUS

## 2021-08-15 MED ORDER — VANCOMYCIN HCL 1250 MG/250ML IV SOLN
1250.0000 mg | INTRAVENOUS | Status: DC
  Administered 2021-08-15: 1000 mg via INTRAVENOUS
  Administered 2021-08-16 – 2021-08-23 (×8): 1250 mg via INTRAVENOUS
  Filled 2021-08-15 (×9): qty 250

## 2021-08-15 MED ORDER — CHLORHEXIDINE GLUCONATE CLOTH 2 % EX PADS
6.0000 | MEDICATED_PAD | Freq: Every day | CUTANEOUS | Status: DC
  Administered 2021-08-15 – 2021-08-19 (×3): 6 via TOPICAL

## 2021-08-15 MED ORDER — IPRATROPIUM-ALBUTEROL 0.5-2.5 (3) MG/3ML IN SOLN
3.0000 mL | Freq: Two times a day (BID) | RESPIRATORY_TRACT | Status: DC
  Administered 2021-08-16 – 2021-09-04 (×39): 3 mL via RESPIRATORY_TRACT
  Filled 2021-08-15 (×40): qty 3

## 2021-08-15 MED ORDER — PIPERACILLIN-TAZOBACTAM 3.375 G IVPB
3.3750 g | Freq: Three times a day (TID) | INTRAVENOUS | Status: DC
  Administered 2021-08-15 – 2021-08-25 (×30): 3.375 g via INTRAVENOUS
  Filled 2021-08-15 (×29): qty 50

## 2021-08-15 MED ORDER — NOREPINEPHRINE 4 MG/250ML-% IV SOLN
2.0000 ug/min | INTRAVENOUS | Status: DC
  Administered 2021-08-15: 6 ug/min via INTRAVENOUS
  Filled 2021-08-15: qty 250

## 2021-08-15 MED ORDER — NOREPINEPHRINE 4 MG/250ML-% IV SOLN
0.0000 ug/min | INTRAVENOUS | Status: DC
  Administered 2021-08-15: 23:00:00 12 ug/min via INTRAVENOUS
  Administered 2021-08-15: 18:00:00 8 ug/min via INTRAVENOUS
  Administered 2021-08-16: 17 ug/min via INTRAVENOUS
  Administered 2021-08-16: 19:00:00 10 ug/min via INTRAVENOUS
  Administered 2021-08-16: 05:00:00 9 ug/min via INTRAVENOUS
  Administered 2021-08-16: 14:00:00 15 ug/min via INTRAVENOUS
  Administered 2021-08-17: 5 ug/min via INTRAVENOUS
  Administered 2021-08-17: 9 ug/min via INTRAVENOUS
  Administered 2021-08-18: 2 ug/min via INTRAVENOUS
  Administered 2021-08-19: 1 ug/min via INTRAVENOUS
  Administered 2021-08-20: 3 ug/min via INTRAVENOUS
  Administered 2021-08-22: 2 ug/min via INTRAVENOUS
  Filled 2021-08-15 (×11): qty 250

## 2021-08-15 MED ORDER — GABAPENTIN 300 MG PO CAPS
300.0000 mg | ORAL_CAPSULE | Freq: Three times a day (TID) | ORAL | Status: DC
  Administered 2021-08-15 – 2021-09-04 (×63): 300 mg via ORAL
  Filled 2021-08-15 (×63): qty 1

## 2021-08-15 MED ORDER — ALPRAZOLAM ER 1 MG PO TB24: 1.0000 mg | ORAL_TABLET | Freq: Three times a day (TID) | ORAL | Status: DC

## 2021-08-15 MED ORDER — NOREPINEPHRINE 4 MG/250ML-% IV SOLN
0.0000 ug/min | INTRAVENOUS | Status: DC
  Administered 2021-08-15: 2 ug/min via INTRAVENOUS
  Administered 2021-08-15: 8 ug/min via INTRAVENOUS
  Filled 2021-08-15: qty 250

## 2021-08-15 MED ORDER — POLYETHYLENE GLYCOL 3350 17 G PO PACK
17.0000 g | PACK | Freq: Every day | ORAL | Status: DC | PRN
  Filled 2021-08-15: qty 1

## 2021-08-15 MED ORDER — VANCOMYCIN HCL IN DEXTROSE 1-5 GM/200ML-% IV SOLN
1000.0000 mg | Freq: Once | INTRAVENOUS | Status: AC
Start: 2021-08-15 — End: 2021-08-15
  Administered 2021-08-15: 1000 mg via INTRAVENOUS
  Filled 2021-08-15: qty 200

## 2021-08-15 MED ORDER — DOCUSATE SODIUM 100 MG PO CAPS: 100.0000 mg | ORAL_CAPSULE | Freq: Two times a day (BID) | ORAL | Status: DC | PRN

## 2021-08-15 MED ORDER — FENTANYL CITRATE PF 50 MCG/ML IJ SOSY
50.0000 ug | PREFILLED_SYRINGE | Freq: Once | INTRAMUSCULAR | Status: AC
  Administered 2021-08-15: 50 ug via INTRAVENOUS
  Filled 2021-08-15: qty 1

## 2021-08-15 MED ORDER — SODIUM CHLORIDE 0.9 % IV SOLN
2.0000 g | Freq: Three times a day (TID) | INTRAVENOUS | Status: DC
  Administered 2021-08-15: 2 g via INTRAVENOUS
  Filled 2021-08-15 (×2): qty 2

## 2021-08-15 MED ORDER — RINGERS IV SOLN: INTRAVENOUS | Status: DC

## 2021-08-15 MED ORDER — LACTATED RINGERS IV SOLN: INTRAVENOUS | Status: AC

## 2021-08-15 NOTE — H&P (Signed)
NAME:  [name], MRN:  [id number], DOB:  24-Apr-1956, LOS: 0 ADMISSION DATE:  08/14/2021, CONSULTATION DATE: August 15, 2021 REFERRING MD: ED, CHIEF COMPLAINT: Leg pain and swelling  History of Present Illness:  [name] with past medical history of COPD on 2 L of oxygen patient is with GERD chronic lower extremity venous insufficiency edema wound was admitted to the hospital 2 months ago with what seems to be chronic ulcer she was supposed to follow-up with wound care and go to rehab but she ended up going to her home never followed up in the wound In the ED arrest patient was found also to have bedbugs She was given 3 L of fluid continue to be hypotensive required low-dose Levophed at 6 mics per minute. Patient had no fever chills rigors no nausea vomiting diarrhea no chest pain no abdominal pain she has significant pain in the back of her right thigh with a swelling of her right lower extremity  Objective   Blood pressure (!) 92/56, pulse 81, temperature (!) 97.4 F (36.3 C), temperature source Oral, resp. rate 13, height 5\' 4"  (1.626 m), weight 86.2 kg, SpO2 92 %.        Intake/Output Summary (Last 24 hours) at 08/15/2021 0559 Last data filed at 08/15/2021 0514 Gross per 24 hour  Intake 21.42 ml  Output --  Net 21.42 ml   Filed Weights   08/14/21 2156 08/15/21 0002  Weight: 37.2 kg 86.2 kg    Examination: General:  NAD , pleasant and hungry , wants to eat . Neuro:  WNL , AOX3 , EOMI , CN II-XII intact , UL , LL strength is symmetrical and 5/5 HEENT:  atraumatic , no jaundice , dry mucous membranes  Cardiovascular: Normal sinus rhythm normal Lungs:  CTA bilateral , no wheezing or crackles  Abdomen:  Soft lax +BS , no tenderness . Musculoskeletal: Severe chronic changes bilateral lower extremities more on the right side with severe ulcer on her right heel the back of her thigh.  Very painful Skin:  No rash      Assessment & Plan:    --  Severe septic shock associated with cellulitis CAT scan was done ruled out necrotizing fasciitis Cefepime broad-spectrum antibiotic she will need wound care might need debridement.  --Severe ulcer on the back of her neck needs vascular studies follow-up outpatient wound care and outpatient wound care  --Severe hypertension patient is dry has not been eating aggressive fluid resuscitation maintenance fluids avoid antihypertensive medications   --Acute kidney injury avoid nephrotoxic agent IV fluids maintain MAP above 65   Thanks for the consultation call 08/17/21 with questions.    Labs   CBC: Recent Labs  Lab 08/15/21 0006  WBC 15.5*  HGB 10.3*  HCT 34.6*  MCV 84.4  PLT 880*    Basic Metabolic Panel: Recent Labs  Lab 08/15/21 0006  NA 128*  K 4.0  CL 93*  CO2 22  GLUCOSE 86  BUN 17  CREATININE 0.90  CALCIUM 8.9   GFR: Estimated Creatinine Clearance: 67.1 mL/min (by C-G formula based on SCr of 0.9 mg/dL). Recent Labs  Lab 08/15/21 0006 08/15/21 0315  WBC 15.5*  --   LATICACIDVEN  --  2.6*    Liver Function Tests: No results for input(s): AST, ALT, ALKPHOS, BILITOT, PROT, ALBUMIN in the last 168 hours. No results for input(s): LIPASE, AMYLASE in the last 168 hours. No results for input(s): AMMONIA in the last 168 hours.  ABG    Component Value Date/Time   HCO3 25.1 (H) 03/15/2007 1206   TCO2 34 (H) 06/16/2021 1254     Coagulation Profile: No results for input(s): INR, PROTIME in the last 168 hours.  Cardiac Enzymes: No results for input(s): CKTOTAL, CKMB, CKMBINDEX, TROPONINI in the last 168 hours.  HbA1C: No results found for: HGBA1C  CBG: No results for input(s): GLUCAP in the last 168 hours.  Review of Systems:   12 points reviewed and was mentioned in the HPI is relevant.  Past Medical History:  She,  has a past medical history of COPD (chronic obstructive pulmonary disease) (HCC), Gout, and Hypercholesteremia.   Surgical History:   Past  Surgical History:  Procedure Laterality Date   ABDOMINAL HYSTERECTOMY     ROTATOR CUFF REPAIR       Social History:   reports that she has been smoking cigarettes. She has a 30.00 pack-year smoking history. She has never used smokeless tobacco. She reports that she does not drink alcohol and does not use drugs.   Family History:  Her family history is not on file.   Allergies No Known Allergies   Home Medications  Prior to Admission medications   Medication Sig Start Date End Date Taking? Authorizing Provider  ALPRAZolam Prudy Feeler) 1 MG tablet Take 1 mg by mouth 3 (three) times daily. 03/22/18   [provider]  buPROPion (WELLBUTRIN XL) 150 MG 24 hr tablet Take 150 mg by mouth every evening.     [provider]  Cyanocobalamin (VITAMIN B-12 PO) Take 1 tablet by mouth daily.    [provider]  DEXILANT 60 MG capsule Take 60 mg by mouth daily. 12/27/17   [provider]  gabapentin (NEURONTIN) 300 MG capsule Take 300 mg by mouth 3 (three) times daily.    [provider]  ipratropium-albuterol (DUONEB) 0.5-2.5 (3) MG/3ML SOLN use 1 vial by nebulization every 4 (four) hours as needed. 03/06/21   Narda Bonds, MD  levothyroxine (SYNTHROID) 150 MCG tablet Take 150 mcg by mouth daily before breakfast.    [provider]  liver oil-zinc oxide (DESITIN) 40 % ointment Apply topically 4 (four) times daily. 06/23/21   Azucena Fallen, MD  oxyCODONE-acetaminophen (PERCOCET/ROXICET) 5-325 MG tablet Take 1 tablet by mouth in the morning, at noon, and at bedtime.    [provider]  SYMBICORT 160-4.5 MCG/ACT inhaler Inhale 2 puffs into the lungs 2 (two) times daily.  11/14/17   [provider]  VITAMIN D PO Take 1 tablet by mouth daily.    [provider]     Critical care time: Critical care time spent the care of this patient is 48 minutes

## 2021-08-15 NOTE — ED Notes (Signed)
Second set of BC drawn 

## 2021-08-15 NOTE — ED Notes (Addendum)
Lactic sent down with previous blood work. Per lab, lab stating they are unable to find the tube, although patient's other blood work has resulted. PA made aware.

## 2021-08-15 NOTE — ED Notes (Signed)
Patient's blood pressure trended into the 123XX123 systolic. Unable to raise despite multiple efforts. PA aware. Pressure bagged x1 liter

## 2021-08-15 NOTE — Progress Notes (Addendum)
Pharmacy Antibiotic Note  [name] is a 66 y.o. female admitted on 08/14/2021 with weeping wound on the right leg.  Pharmacy has been consulted for vancomycin and cefepime dosing.  Plan: Vancomycin 1gm IV x 1 then 1250mg  q24h (AUC 524.3, used Scr 0.8) Cefepime 2gm IV q8h Follow renal function and clinical course  Height: 5\' 4"  (162.6 cm) Weight: 86.2 kg (190 lb) IBW/kg (Calculated) : 54.7  Temp (24hrs), Avg:97.4 F (36.3 C), Min:97.4 F (36.3 C), Max:97.4 F (36.3 C)  Recent Labs  Lab 08/15/21 0006  WBC 15.5*  CREATININE 0.90    Estimated Creatinine Clearance: 67.1 mL/min (by C-G formula based on SCr of 0.9 mg/dL).    No Known Allergies  Antimicrobials this admission: 1/14 vanc >> 1/14 cefepime >>  Dose adjustments this admission:   Microbiology results: 1/14 BCx:    Thank you for allowing pharmacy to be a part of this patients care.  2/14 RPh 08/15/2021, 1:56 AM

## 2021-08-15 NOTE — Progress Notes (Signed)
Echocardiogram 2D Echocardiogram has been performed.  [name] 08/15/2021, 3:34 PM

## 2021-08-15 NOTE — Sepsis Progress Note (Signed)
Monitoring for the code sepsis protocol. °

## 2021-08-15 NOTE — Progress Notes (Signed)
Peripherally Inserted Central Catheter Placement  The IV Nurse has discussed with the patient and/or persons authorized to consent for the patient, the purpose of this procedure and the potential benefits and risks involved with this procedure.  The benefits include less needle sticks, lab draws from the catheter, and the patient may be discharged home with the catheter. Risks include, but not limited to, infection, bleeding, blood clot (thrombus formation), and puncture of an artery; nerve damage and irregular heartbeat and possibility to perform a PICC exchange if needed/ordered by physician.  Alternatives to this procedure were also discussed.  Bard Power PICC patient education guide, fact sheet on infection prevention and patient information card has been provided to patient /or left at bedside.    PICC Placement Documentation  PICC Triple Lumen 08/15/21 PICC Right Brachial 34 cm 0 cm (Active)  Indication for Insertion or Continuance of Line Limited venous access - need for IV therapy >5 days (PICC only);Poor Vasculature-patient has had multiple peripheral attempts or PIVs lasting less than 24 hours;Vasoactive infusions 08/15/21 1700  Exposed Catheter (cm) 0 cm 08/15/21 1700  Site Assessment Clean;Dry;Intact 08/15/21 1700  Lumen #1 Status Flushed;Saline locked;Blood return noted 08/15/21 1700  Lumen #2 Status Flushed;Saline locked;Blood return noted 08/15/21 1700  Lumen #3 Status Flushed;Saline locked;Blood return noted 08/15/21 1700  Dressing Type Transparent;Securing device 08/15/21 1700  Dressing Status Clean;Dry;Intact 08/15/21 1700  Antimicrobial disc in place? Yes 08/15/21 1700  Safety Lock Not Applicable [id number]  Line Care Connections checked and tightened 08/15/21 1700  Line Adjustment (NICU/IV Team Only) No 08/15/21 1700  Dressing Intervention New dressing;Antimicrobial disc changed;Securement device changed 08/15/21 1700  Dressing Change Due 08/22/21 08/15/21 1700        Quin Hoop 08/15/2021, 5:36 PM

## 2021-08-15 NOTE — Progress Notes (Addendum)
Pt with one working IV upon arrival to ICU. IV infusing levophed, attempts made to wean were not tolerated well. Numerous RNs on the unit attempted and IV team RN attempted to gain access with ultrasound. Unable to give all boluses and antibiotics d/t compatibility issues. Dr. Marchelle Gearing made aware. Orders placed for PICC line. PICC team made aware of the urgent need. RN, at the request of Dr. Monica Becton, called pharmacy to discuss compatibilities and different medication options d/t only currently having one IV. See MAR for medication changes. Per Dr. Marchelle Gearing, he would prefer to wait to obtain PICC, if emergent need arises he will consider placing CVC.

## 2021-08-15 NOTE — H&P (Addendum)
NAME:  [name], MRN:  [id number], DOB:  1956-06-06, LOS: 0 ADMISSION DATE:  08/14/2021, CONSULTATION DATE: August 15, 2021 REFERRING MD: ED, CHIEF COMPLAINT: Leg pain and swelling  BRIEF  [name] with past medical history of COPD on 2 L of oxygen patient is with GERD chronic lower extremity venous insufficiency edema wound was admitted to the hospital 2 months ago with what seems to be chronic ulcer she was supposed to follow-up with wound care and go to rehab but she ended up going to her home never followed up in the wound In the ED arrest patient was found also to have bedbugs She was given 3 L of fluid continue to be hypotensive required low-dose Levophed at 6 mics per minute. Patient had no fever chills rigors no nausea vomiting diarrhea no chest pain no abdominal pain she has significant pain in the back of her right thigh with a swelling of her right lower extremity   PAST   has a past medical history of COPD (chronic obstructive pulmonary disease) ([place]), Gout, and Hypercholesteremia.   has a past surgical history that includes Abdominal hysterectomy and Rotator cuff repair.    EVENTS   08/15/2021 -  repeat rounds: History review shows admission 06/16/2021 - 06/23/2021 with history of COPD on 2 L, depression on Wellbutrin and venlafaxine and anxiety on alprazolam, acid reflux hypothyroidism chronic lower extremity wounds [follows with Dr. Duda] hyperlipidemia, failure to thrive self-neglect with evidence of home infestation by bedbugs and fleas in November 2022, limited mobility using walker, that admission was for sepsis secondary to bilateral lower extremity cellulitis.  And stage II sacral decub.  She consistently refused dressing changes.  Patient discharged to [facility] to recent [place] home for antibiotics and dressing changes but subsequently went back to her house which according to the patient because she was ambulatory.  However according to the  cousin patient has been with a cousin all along and has refused to participate in care and also has been having failure to thrive with diminished eating.  The cousin is asking for placement.  The cousin is supportive of her DNR and DNI  -Currently admit 1223 resting on ICU.  She is on Levophed 9 mcg with good blood pressure.  She is awake and interactive.  She has significant pain in her lower extremities.  RN describes significant right thigh posterior ulcer and right foot ulcer and sacral decub all present on admission.    Objective   Blood pressure (!) 121/96, pulse (!) 57, temperature (!) 97.4 F (36.3 C), temperature source Oral, resp. rate 12, height 5\' 4"  (1.626 m), weight 86.2 kg, SpO2 94 %.        Intake/Output Summary (Last 24 hours) at 08/15/2021 1149 Last data filed at 08/15/2021 0912 Gross per 24 hour  Intake 277.67 ml  Output --  Net 277.67 ml   Filed Weights   08/14/21 2156 08/15/21 0002  Weight: 37.2 kg 86.2 kg    General Appearance:  Looks chronically unwell.  Disheveled. Head:  Normocephalic, without obvious abnormality, atraumatic Eyes:  PERRL -yes, conjunctiva/corneas - mudy     Ears:  Normal external ear canals, both ears Nose:  G tube -no oxygen.  No feeding tube Throat:  ETT TUBE - no , OG tube - no.  Poor dentition Neck:  Supple,  No enlargement/tenderness/nodules Lungs: Clear to auscultation bilaterally, mild barrel chest.  No wheezing Heart:  S1 and S2 normal, no murmur, CVP - no.  Pressors -Levophed 9 mcg through peripheral line Abdomen:  Soft, no masses, no organomegaly Genitalia / Rectal:  Not done Extremities:  Extremities-intact but has huge right thigh posterior ulcer with purulent drainage right foot ulcer sacral decub described Skin:  ntact in exposed areas . Sacral area -this Present on admission.  Details being worked out Neurologic:  Sedation -none+2-> RASS - +1 . Moves all 4s -yes. CAM-ICU -negative. 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 - Prior to & Present on Admit -on gabapentin and Percocet at home Anxiety - Prior to & Present on Admit -on Xanax at home Depression on Wellbutrin and venlafaxine -at home - Prior to & Present on Admit  P:   Fentanyl low-dose as needed for pain Continue gabapentin but if she gets drowsy back off   VASCULAR A:   Circulatory shock-likely septic shock due to lower extremity wound/ulcer with dehydration - Present on Admit   P:  Levophed Fluid bolus Maintenance fluids MAP goal greater than 60 with systolic goal greater than 105  CARDIAC STRUCTURAL A: History of diastolic dysfunction last documented 2007 echo -  .mpoa   P: Check troponin Check echo   CARDIAC ELECTRICAL A: Sinus tach   P: Telemetry  INFECTIOUS A:   Severe sepsis -likely due to cellulitis and wound in the lower extremities and sacrum  P:   Anti-infectives (From admission, onward)    Start     Dose/Rate Route Frequency Ordered Stop   08/15/21 2200  vancomycin (VANCOREADY) IVPB 1250 mg/250 mL        1,250 mg 166.7 mL/hr over 90 Minutes Intravenous Every 24 hours 08/15/21 0154     08/15/21 1315  metroNIDAZOLE (FLAGYL) IVPB 500 mg        500 mg 100 mL/hr over 60 Minutes Intravenous Every 12 hours 08/15/21 1229     08/15/21 0445  ceFEPIme (MAXIPIME) 2 g in sodium chloride 0.9 % 100 mL IVPB        2 g 200 mL/hr over 30 Minutes Intravenous Every 8 hours 08/15/21 0442     08/15/21 0015  vancomycin (VANCOCIN) IVPB 1000 mg/200 mL premix        1,000 mg 200 mL/hr over 60 Minutes Intravenous  Once 08/15/21 0006 08/15/21 0150   08/14/21 2300  vancomycin (VANCOREADY) IVPB 750  mg/150 mL  Status:  Discontinued        750 mg 150 mL/hr over 60 Minutes Intravenous  Once 08/14/21 2246 08/15/21 0006        RENAL A:  At risk for acute kidney injury P:  Monitor Avoid nephrotoxins  ELECTROLYTES A:  Mild hyponatremia moderate hyponatremia - Prior to & Present on Admit  P: Monitor and replete as needed   GASTROINTESTINAL A:   GERD hx - Prior to & Present on Admit -on PPI  P:   PPI Can eat awake and  HEMATOLOGIC   - HEME A:  Anemia of chronic disease.  Baseline hemoglobin 8 g% November 2022 to discharge from hospital - Prior to & Present on Admit    P:  - PRBC for hgb </=  6.9gm%    - exceptions are   -  if ACS susepcted/confirmed then transfuse for hgb </= 8.0gm%,  or    -  active bleeding with hemodynamic instability, then transfuse regardless of hemoglobin value   At at all times try to transfuse 1 unit prbc as possible with exception of active hemorrhage   HEMATOLOGIC - Platelets A Thrombocytosis at admission probably due to stress reaction and sepsis  P DVT prophylaxis and monitor  ENDOCRINE A:   At risk for hyperand hypoglycemia P:   SSI  MSK/DERM Failure to thrive - Prior to & Present on Admit Immobility using walker - Prior to & Present on Admit Severe protein calorie malnutrition [November 2022 albumin below 2] - Prior to & Present on Admit Cachexia - Prior to & Present on Admit Self-neglect with poor motivation9- Prior to & Present on Admit Sacral decub's and lower extremity wounds and ulcers - Prior to & Present on Admit Poor social situation with bedbugs and fleas in the house -Prior to & Present on Admit Noncompliance with medical instructions - Prior to & Present on Admit   Plan  - Wound care consult = Social work consult -Likely needs goals of care consult as well -Might need placement    Best practice:  Diet: Carb modified Pain/Anxiety/Delirium protocol (if indicated): X VAP protocol (if indicated): X DVT  prophylaxis: Lovenox GI prophylaxis: PPI Glucose control: SSI Mobility: Bedrest Code Status: DNR (08/15/2021 at admission: Husband is in favor of DNR.  Also spoke independently to the patient.  Explained to the patient that we are trying to get her better but in the process if her heart stops or her breathing stops then natural death has occurred.  Expressed to the patient that in the event this was to happen would be recognized this is natural death or try to intervene in terms of intubation or CPR.  Expressed to the patient that given her medical conditions probably not in her best interest to go through CPR and intubation.  Patient states she was aligned with this and confirmed DNR/DNI.  Communicated this with nurse Ebony Hail) Family Communication: Called [name] relative [id number] -> cousin reports that she is very difficult and challenging to take care of.   Cousin is requesting placement in a nursing home.  Cousin is in favor of DNR  Disposition: ICU     ATTESTATION & SIGNATURE   The patient [name] is critically ill with multiple organ systems failure and requires high complexity decision making for assessment and support, frequent evaluation and titration of therapies, application of advanced monitoring technologies and extensive interpretation of multiple databases.   Critical Care Time devoted to patient care services described in this note is  60  Minutes. This time reflects time of care of this signee Dr Brand Males. This critical care time does not reflect procedure time, or teaching time or supervisory time of PA/NP/Med student/Med Resident etc but could involve care discussion time     Dr. Brand Males, M.D., [facility].C.P Pulmonary and Critical Care Medicine Staff Physician [place] [facility] Pager: [phone], If no answer or between  15:00h - 7:00h: call [phone]  08/15/2021 11:50 AM    LABS    PULMONARY No  results for input(s): PHART, PCO2ART, PO2ART, HCO3, TCO2, O2SAT in the last 168 hours.  Invalid input(s): PCO2, PO2  CBC Recent Labs  Lab 08/15/21 0006 08/15/21 0742  HGB 10.3* 9.2*  HCT 34.6* 30.4*  WBC 15.5* 13.1*  PLT 880* 765*    COAGULATION No results for input(s): INR in the last 168 hours.  CARDIAC  No results for input(s): TROPONINI in the last 168 hours. No results for input(s): PROBNP in the last 168 hours.   CHEMISTRY Recent Labs  Lab 08/15/21 0006 08/15/21 0742  NA 128*  --   K 4.0  --   CL 93*  --   CO2 22  --   GLUCOSE 86  --   BUN 17  --   CREATININE 0.90 0.70  CALCIUM 8.9  --    Estimated Creatinine Clearance: 75.5 mL/min (by C-G formula based on SCr of 0.7 mg/dL).   LIVER No results for input(s): AST, ALT, ALKPHOS, BILITOT, PROT, ALBUMIN, INR in the last 168 hours.   INFECTIOUS Recent Labs  Lab 08/15/21 0027 08/15/21 0315  LATICACIDVEN 1.8 2.6*     ENDOCRINE CBG (last 3)  No results for input(s): GLUCAP in the last 72 hours.       IMAGING x48h  - image(s) personally visualized  -   highlighted in bold DG Chest Port 1 View  Result Date: 08/14/2021 CLINICAL DATA:  leg swelling EXAM: PORTABLE CHEST 1 VIEW COMPARISON:  Chest x-ray 06/16/2021, CT chest 02/28/2021 FINDINGS: The heart and mediastinal contours are within normal limits. No focal consolidation. No pulmonary edema. No pleural effusion. No pneumothorax. No acute osseous abnormality. Bilateral shoulder degenerative changes. IMPRESSION: No active disease. Electronically Signed   By: Iven Finn M.D.   On: 08/14/2021 23:05   CT EXTREMITY LOWER RIGHT W CONTRAST  Result Date: 08/15/2021 CLINICAL DATA:  Status post trauma to the right leg 1 month ago with posterior weeping wound. EXAM: CT OF THE LOWER RIGHT EXTREMITY WITH CONTRAST TECHNIQUE: Multidetector CT imaging of the lower right extremity was performed according to the standard protocol following intravenous contrast  administration. RADIATION DOSE REDUCTION: This exam was performed according to the departmental dose-optimization program which includes automated exposure control, adjustment of the mA and/or kV according to patient size and/or use of iterative reconstruction technique. CONTRAST:  55mL OMNIPAQUE IOHEXOL 350 MG/ML SOLN COMPARISON:  None. FINDINGS: Bones/Joint/Cartilage There is no evidence of acute fracture or dislocation. Marked severity medial and lateral marginal osteophytes are present. Marked severity medial and lateral tibiofemoral joint space narrowing is seen. Marked severity patellofemoral narrowing is also noted. A small joint effusion is present. Ligaments Suboptimally assessed by CT. Muscles and Tendons Moderate severity perimuscular inflammatory fat stranding is seen along the posterior aspect of the proximal to mid right lower extremity. Soft tissues Approximately 3.8 cm x 1.7 cm x 1.6 cm and 3.4 cm x 2.5 cm x 1.4 cm soft tissue defects are seen along the posterior aspect of the proximal to mid right lower extremity. These defects both extend to the level of the musculature of the posterior right leg. Marked severity adjacent deep and subcutaneous inflammatory fat stranding is also seen which extends inferiorly along the posterior aspect of the right leg to the distal aspect of the right calf. Cutaneous thickening is also present. Moderate to marked severity subcutaneous inflammatory fat stranding is also seen along the anterior aspects of the mid and distal right tibia and right fibula. No organized fluid collections or abscesses are identified. IMPRESSION: 1. Large soft tissue defects along the posterior aspect of the proximal right leg with associated marked severity cellulitis, as described above. 2. Marked severity cellulitis within the soft tissues anterior to the right  tibia and right fibula. 3. No evidence of an organized fluid collection or abscess. 4. Marked severity chronic and degenerative  changes involving the right knee. 5. Small joint effusion. Electronically Signed   By: Virgina Norfolk M.D.   On: 08/15/2021 03:21

## 2021-08-15 NOTE — ED Notes (Signed)
Patient transported to CT 

## 2021-08-16 DIAGNOSIS — R6521 Severe sepsis with septic shock: Secondary | ICD-10-CM | POA: Diagnosis not present

## 2021-08-16 DIAGNOSIS — A419 Sepsis, unspecified organism: Secondary | ICD-10-CM | POA: Diagnosis not present

## 2021-08-16 LAB — COMPREHENSIVE METABOLIC PANEL
ALT: 10 U/L (ref 0–44)
AST: 12 U/L — ABNORMAL LOW (ref 15–41)
Albumin: 1.9 g/dL — ABNORMAL LOW (ref 3.5–5.0)
Alkaline Phosphatase: 77 U/L (ref 38–126)
Anion gap: 6 (ref 5–15)
BUN: 9 mg/dL (ref 8–23)
CO2: 26 mmol/L (ref 22–32)
Calcium: 8.2 mg/dL — ABNORMAL LOW (ref 8.9–10.3)
Chloride: 102 mmol/L (ref 98–111)
Creatinine, Ser: 0.67 mg/dL (ref 0.44–1.00)
GFR, Estimated: >60 mL/min (ref >60)
Glucose, Bld: 145 mg/dL — ABNORMAL HIGH (ref 70–99)
Potassium: 3.4 mmol/L — ABNORMAL LOW (ref 3.5–5.1)
Sodium: 134 mmol/L — ABNORMAL LOW (ref 135–145)
Total Bilirubin: 0.4 mg/dL (ref 0.3–1.2)
Total Protein: 6.1 g/dL — ABNORMAL LOW (ref 6.5–8.1)

## 2021-08-16 LAB — CBC
HCT: 31.7 % — ABNORMAL LOW (ref 36.0–46.0)
Hemoglobin: 9.4 g/dL — ABNORMAL LOW (ref 12.0–15.0)
MCH: 25.1 pg — ABNORMAL LOW (ref 26.0–34.0)
MCHC: 29.7 g/dL — ABNORMAL LOW (ref 30.0–36.0)
MCV: 84.8 fL (ref 80.0–100.0)
Platelets: 791 10*3/uL — ABNORMAL HIGH (ref 150–400)
RBC: 3.74 MIL/uL — ABNORMAL LOW (ref 3.87–5.11)
RDW: 21.6 % — ABNORMAL HIGH (ref 11.5–15.5)
WBC: 11 10*3/uL — ABNORMAL HIGH (ref 4.0–10.5)
nRBC: 0 % (ref 0.0–0.2)

## 2021-08-16 LAB — GLUCOSE, CAPILLARY
Glucose-Capillary: 149 mg/dL — ABNORMAL HIGH (ref 70–99)
Glucose-Capillary: 149 mg/dL — ABNORMAL HIGH (ref 70–99)
Glucose-Capillary: 164 mg/dL — ABNORMAL HIGH (ref 70–99)
Glucose-Capillary: 167 mg/dL — ABNORMAL HIGH (ref 70–99)
Glucose-Capillary: 168 mg/dL — ABNORMAL HIGH (ref 70–99)
Glucose-Capillary: 200 mg/dL — ABNORMAL HIGH (ref 70–99)
Glucose-Capillary: 202 mg/dL — ABNORMAL HIGH (ref 70–99)

## 2021-08-16 LAB — PROCALCITONIN: Procalcitonin: <0.1 ng/mL

## 2021-08-16 LAB — PHOSPHORUS: Phosphorus: 3.8 mg/dL (ref 2.5–4.6)

## 2021-08-16 LAB — LACTIC ACID, PLASMA: Lactic Acid, Venous: 2 mmol/L (ref 0.5–1.9)

## 2021-08-16 LAB — CORTISOL: Cortisol, Plasma: 14.8 ug/dL

## 2021-08-16 MED ORDER — ONDANSETRON HCL 4 MG/2ML IJ SOLN
4.0000 mg | Freq: Three times a day (TID) | INTRAMUSCULAR | Status: DC | PRN
  Administered 2021-08-16 – 2021-08-19 (×3): 4 mg via INTRAVENOUS
  Filled 2021-08-16 (×4): qty 2

## 2021-08-16 MED ORDER — SODIUM CHLORIDE 0.9% FLUSH: 10.0000 mL | INTRAVENOUS | Status: DC | PRN

## 2021-08-16 MED ORDER — SODIUM CHLORIDE 0.9% FLUSH
10.0000 mL | Freq: Two times a day (BID) | INTRAVENOUS | Status: DC
  Administered 2021-08-16 – 2021-08-17 (×3): 10 mL
  Administered 2021-08-17: 30 mL
  Administered 2021-08-18: 10 mL
  Administered 2021-08-18: 22:00:00 30 mL
  Administered 2021-08-19: 10 mL
  Administered 2021-08-19: 22:00:00 20 mL
  Administered 2021-08-20 – 2021-08-23 (×7): 10 mL
  Administered 2021-08-23: 30 mL
  Administered 2021-08-24: 22:00:00 10 mL
  Administered 2021-08-24: 09:00:00 30 mL
  Administered 2021-08-25: 22:00:00 10 mL
  Administered 2021-08-26: 21:00:00 30 mL
  Administered 2021-08-26: 09:00:00 20 mL
  Administered 2021-08-27 – 2021-08-28 (×3): 30 mL
  Administered 2021-08-28 – 2021-08-29 (×3): 10 mL
  Administered 2021-08-30: 20 mL
  Administered 2021-08-30: 10 mL
  Administered 2021-08-31: 30 mL
  Administered 2021-08-31 – 2021-09-04 (×7): 10 mL

## 2021-08-16 MED ORDER — CHLORHEXIDINE GLUCONATE CLOTH 2 % EX PADS
6.0000 | MEDICATED_PAD | Freq: Every day | CUTANEOUS | Status: AC
  Administered 2021-08-18 – 2021-08-21 (×3): 6 via TOPICAL

## 2021-08-16 MED ORDER — MUPIROCIN 2 % EX OINT
1.0000 "application " | TOPICAL_OINTMENT | Freq: Two times a day (BID) | CUTANEOUS | Status: AC
  Administered 2021-08-17 – 2021-08-21 (×10): 1 via NASAL
  Filled 2021-08-16 (×2): qty 22

## 2021-08-16 MED ORDER — POTASSIUM CHLORIDE CRYS ER 20 MEQ PO TBCR
40.0000 meq | EXTENDED_RELEASE_TABLET | Freq: Once | ORAL | Status: AC
  Administered 2021-08-16: 40 meq via ORAL
  Filled 2021-08-16: qty 2

## 2021-08-16 MED ORDER — ONDANSETRON HCL 4 MG/2ML IJ SOLN
4.0000 mg | Freq: Once | INTRAMUSCULAR | Status: AC
  Administered 2021-08-16: 4 mg via INTRAVENOUS
  Filled 2021-08-16: qty 2

## 2021-08-16 MED ORDER — VENLAFAXINE HCL ER 150 MG PO CP24
150.0000 mg | ORAL_CAPSULE | Freq: Every day | ORAL | Status: DC
  Administered 2021-08-16 – 2021-09-04 (×20): 150 mg via ORAL
  Filled 2021-08-16 (×21): qty 1

## 2021-08-16 MED ORDER — BUPROPION HCL ER (XL) 150 MG PO TB24
150.0000 mg | ORAL_TABLET | Freq: Every evening | ORAL | Status: DC
  Administered 2021-08-16 – 2021-09-04 (×20): 150 mg via ORAL
  Filled 2021-08-16 (×21): qty 1

## 2021-08-16 MED ORDER — HYDROCORTISONE SOD SUC (PF) 100 MG IJ SOLR
100.0000 mg | Freq: Two times a day (BID) | INTRAMUSCULAR | Status: DC
  Administered 2021-08-16 – 2021-08-23 (×16): 100 mg via INTRAVENOUS
  Filled 2021-08-16 (×16): qty 2

## 2021-08-16 MED ORDER — LACTATED RINGERS IV BOLUS
1000.0000 mL | Freq: Once | INTRAVENOUS | Status: AC
  Administered 2021-08-16: 1000 mL via INTRAVENOUS

## 2021-08-16 MED ORDER — HYDROCORTISONE SOD SUC (PF) 100 MG IJ SOLR: 50.0000 mg | Freq: Four times a day (QID) | INTRAMUSCULAR | Status: DC

## 2021-08-16 NOTE — Progress Notes (Signed)
eLink Physician-Brief Progress Note Patient Name: [name] DOB: 1955-11-11 MRN: [id number]   Date of Service  08/16/2021  HPI/Events of Note  Nausea - QTc = 0.474 seconds.   eICU Interventions  Plan: Zofran 4 mg IV Q 8 hours PRN N/V.     Intervention Category Major Interventions: Other:  Lenell Antu 08/16/2021, 9:34 PM

## 2021-08-16 NOTE — Progress Notes (Signed)
Date and time results received: 08/16/21 1112 (use smartphrase ".now" to insert current time)  Test: Lactic acid Critical Value: 2.0  Name of Provider Notified: DR. Marchelle Gearing

## 2021-08-16 NOTE — Progress Notes (Addendum)
NAME:  [name], MRN:  [id number], DOB:  February 06, 1956, LOS: 1 ADMISSION DATE:  08/14/2021, CONSULTATION DATE: August 15, 2021 REFERRING MD: ED, CHIEF COMPLAINT: Leg pain and swelling  BRIEF  [name] with past medical history of COPD on 2 L of oxygen patient is with GERD chronic lower extremity venous insufficiency edema wound was admitted to the hospital 2 months ago with what seems to be chronic ulcer she was supposed to follow-up with wound care and go to rehab but she ended up going to her home never followed up in the wound In the ED arrest patient was found also to have bedbugs She was given 3 L of fluid continue to be hypotensive required low-dose Levophed at 6 mics per minute. Patient had no fever chills rigors no nausea vomiting diarrhea no chest pain no abdominal pain she has significant pain in the back of her right thigh with a swelling of her right lower extremity   PAST   has a past medical history of COPD (chronic obstructive pulmonary disease) ([place]), Gout, and Hypercholesteremia.   has a past surgical history that includes Abdominal hysterectomy and Rotator cuff repair.    EVENTS   08/15/2021 -  repeat rounds: History review shows admission 06/16/2021 - 06/23/2021 with history of COPD on 2 L, depression on Wellbutrin and venlafaxine and anxiety on alprazolam, acid reflux hypothyroidism chronic lower extremity wounds [follows with Dr. Duda] hyperlipidemia, failure to thrive self-neglect with evidence of home infestation by bedbugs and fleas in November 2022, limited mobility using walker, that admission was for sepsis secondary to bilateral lower extremity cellulitis.  And stage II sacral decub.  She consistently refused dressing changes.  Patient discharged to [facility] to recent [place] home for antibiotics and dressing changes but subsequently went back to her house which according to the patient because she was ambulatory.  However according to the  cousin patient has been with a cousin all along and has refused to participate in care and also has been having failure to thrive with diminished eating.  The cousin is asking for placement.  The cousin is supportive of her DNR and DNI  -Currently admit 1223 resting on ICU.  She is on Levophed 9 mcg with good blood pressure.  She is awake and interactive.  She has significant pain in her lower extremities.  RN describes significant right thigh posterior ulcer and right foot ulcer and sacral decub all present on admission.    SUBJECTIVE/OVERNIGHT/INTERVAL HX    1/15-on room air.  Not on the ventilator.  Continues to be on Levophed 16 mcg.  Has had a right upper extremity PICC line placed -08/15/2021    Objective   Blood pressure (!) 87/53, pulse 77, temperature 98.6 F (37 C), temperature source Axillary, resp. rate 14, height 5\' 4"  (1.626 m), weight 86.2 kg, SpO2 96 %.        Intake/Output Summary (Last 24 hours) at 08/16/2021 1107 Last data filed at 08/16/2021 [id number] Gross per 24 hour  Intake 2367.35 ml  Output 3150 ml  Net -782.65 ml   Filed Weights   08/14/21 2156 08/15/21 0002  Weight: 37.2 kg 86.2 kg    General Appearance:  Looks deconditioned Head:  Normocephalic, without obvious abnormality, atraumatic Eyes:  PERRL - yes, conjunctiva/corneas - muddy     Ears:  Normal external ear canals, both ears Nose:  G tube - no Throat:  ETT TUBE - no , OG tube - no. EDENTULOUS Neck:  Supple,  No enlargement/tenderness/nodules Lungs: Clear to auscultation bilaterally, Heart:  S1 and S2 normal, no murmur, CVP - no.  Pressors - LEVOPHEED + Abdomen:  Soft, no masses, no organomegaly Genitalia / Rectal:  Not done Extremities:  Extremities- intat but has cellultis and Rt thigh ulcer Skin:  ntact in exposed areas . Sacral area - DECUB + Neurologic:  Sedation - none -> RASS - sleeping but easily arousable today . Moves all 4s - yes.        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 - Prior to & Present on Admit -on gabapentin and Percocet at home Anxiety - Prior to & Present on Admit -on Xanax at home Depression on Wellbutrin and venlafaxine -at home - Prior to & Present on Admit  P:   Fentanyl low-dose as needed for pain Continue gabapentin but if she gets drowsy back off Reintroduce wellburtin and venlafaxine 08/16/21     Circulatory shock-likely septic shock due to lower extremity wound/ulcer with dehydration - Present on Admit   08/16/21 - stilll on levophed despite midodrine since 08/15/21  P:  Levophed Midodrine Star hydrocort (send random cortisol first) Fluid bolus Maintenance fluids MAP goal greater than 60 with systolic goal greater than 105   History of diastolic dysfunction last documented 2007 echo.  Admission echo with grade 1 diastolic dysfunction   P: Check troponin  : Sinus tach   P: Telemetry     Severe sepsis -likely due to cellulitis and wound in the lower extremities and sacrum.  MRSA PCR positive - Present on Admit  08/16/2021: So far culture negative   P:   Vancomycin 08/15/2021 Zosyn 08/15/2021 Cefepime 08/15/2021 - 08/15/2021 Flagyl 08/15/2021 - 08/15/2021   Intermittent electrolyte abnormalities Mild hyponatremia moderate hyponatremia - Prior to & Present on Admit  08/16/2021: Mild hypokalemia  P: Replete potassium Monitor and replete as needed    GERD hx - Prior to & Present on Admit -on PPI  P:   PPI Oral diet   Anemia of chronic disease.  Baseline hemoglobin 8 g% November 2022 to discharge from hospital - Prior to & Present on Admit  08/16/2021 no active bleeding  P:  - PRBC for hgb </= 6.9gm%    - exceptions are   -  if ACS  susepcted/confirmed then transfuse for hgb </= 8.0gm%,  or    -  active bleeding with hemodynamic instability, then transfuse regardless of hemoglobin value   At at all times try to transfuse 1 unit prbc as possible with exception of active hemorrhage    Thrombocytosis at admission probably due to stress reaction and sepsis  08/16/2021: Improved platelets  P DVT prophylaxis and monitor    At risk for hyperand hypoglycemia Previous history of Synthroid intake with TSH consistently low - Prior to & Present on Admit  P:   SSI Check TSH and free T4 08/17/2021 and then decide on starting Synthroid  MSK/DERM Failure to thrive - Prior to & Present on Admit Immobility using walker - Prior to & Present on Admit Severe protein calorie malnutrition [November 2022 albumin below 2] - Prior to & Present on Admit Cachexia - Prior to & Present  on Admit Self-neglect with poor motivation9- Prior to & Present on Admit Sacral decub's and lower extremity wounds and ulcers - Prior to & Present on Admit Poor social situation with bedbugs and fleas in the house -Prior to & Present on Admit Noncompliance with medical instructions - Prior to & Present on [place]  - Wound care consult = Social work consult -Likely needs goals of care consult as well -Family requesting placement   Best practice:  Diet: Carb modified Pain/Anxiety/Delirium protocol (if indicated): X VAP protocol (if indicated): X DVT prophylaxis: Lovenox GI prophylaxis: PPI Glucose control: SSI Mobility: Bedrest   Code Status: DNR (08/15/2021 at admission: Cousin is in favor of DNR.  Also spoke independently to the patient.  Explained to the patient that we are trying to get her better but in the process if her heart stops or her breathing stops then natural death has occurred.  Expressed to the patient that in the event this was to happen would be recognized this is natural death or try to intervene in terms of intubation or  CPR.  Expressed to the patient that given her medical conditions probably not in her best interest to go through CPR and intubation.  Patient states she was aligned with this and confirmed DNR/DNI.  Communicated this with nurse Ebony Hail ). Family Communication: Called [name] relative [phone] -> cousin reports that she is very difficult and challenging to take care of.   Cousin is requesting placement in a nursing home.  Cousin is in favor of DNR  Disposition: ICU     ATTESTATION & SIGNATURE   The patient TYRHONDA VONBERGEN is critically ill with multiple organ systems failure and requires high complexity decision making for assessment and support, frequent evaluation and titration of therapies, application of advanced monitoring technologies and extensive interpretation of multiple databases.   Critical Care Time devoted to patient care services described in this note is  60  Minutes. This time reflects time of care of this signee Dr Brand Males. This critical care time does not reflect procedure time, or teaching time or supervisory time of PA/NP/Med student/Med Resident etc but could involve care discussion time     Dr. Brand Males, M.D., [facility].C.P Pulmonary and Critical Care Medicine Staff Physician [place] Pager: [phone], If no answer or between  15:00h - 7:00h: call [phone]  08/16/2021 11:07 AM    LABS    PULMONARY Recent Labs  Lab 08/15/21 1423  PHART 7.436  PCO2ART 37.1  PO2ART 79.4*  HCO3 24.5  O2SAT 95.6    CBC Recent Labs  Lab 08/15/21 0006 08/15/21 0742 08/16/21 0500  HGB 10.3* 9.2* 9.4*  HCT 34.6* 30.4* 31.7*  WBC 15.5* 13.1* 11.0*  PLT 880* 765* 791*    COAGULATION No results for input(s): INR in the last 168 hours.  CARDIAC  No results for input(s): TROPONINI in the last 168 hours. No results for input(s): PROBNP in the last 168 hours.   CHEMISTRY Recent Labs  Lab  08/15/21 0006 08/15/21 0742 08/15/21 1234 08/16/21 0500  NA 128*  --   --  134*  K 4.0  --   --  3.4*  CL 93*  --   --  102  CO2 22  --   --  26  GLUCOSE 86  --   --  145*  BUN 17  --   --  9  CREATININE  0.90 0.70  --  0.67  CALCIUM 8.9  --   --  8.2*  MG  --   --  2.4  --   PHOS  --   --   --  3.8   Estimated Creatinine Clearance: 75.5 mL/min (by C-G formula based on SCr of 0.67 mg/dL).   LIVER Recent Labs  Lab 08/16/21 0500  AST 12*  ALT 10  ALKPHOS 77  BILITOT 0.4  PROT 6.1*  ALBUMIN 1.9*     INFECTIOUS Recent Labs  Lab 08/15/21 0027 08/15/21 0315 08/16/21 0500  LATICACIDVEN 1.8 2.6*  --   PROCALCITON  --   --  <0.10     ENDOCRINE CBG (last 3)  Recent Labs    08/16/21 0006 08/16/21 0404 08/16/21 0752  GLUCAP 167* 149* 149*         IMAGING x48h  - image(s) personally visualized  -   highlighted in bold DG Chest Port 1 View  Result Date: 08/14/2021 CLINICAL DATA:  leg swelling EXAM: PORTABLE CHEST 1 VIEW COMPARISON:  Chest x-ray 06/16/2021, CT chest 02/28/2021 FINDINGS: The heart and mediastinal contours are within normal limits. No focal consolidation. No pulmonary edema. No pleural effusion. No pneumothorax. No acute osseous abnormality. Bilateral shoulder degenerative changes. IMPRESSION: No active disease. Electronically Signed   By: Iven Finn M.D.   On: 08/14/2021 23:05   ECHOCARDIOGRAM COMPLETE  Result Date: 08/15/2021    ECHOCARDIOGRAM REPORT   Patient Name:   SHEKERIA OSUNA Date of Exam: 08/15/2021 Medical Rec #:  [id number]     Height:       64.0 in Accession #:    [id number]    Weight:       190.0 lb Date of Birth:  [birth year]     BSA:          1.914 m Patient Age:    89 years      BP:           102/39 mmHg Patient Gender: F             HR:           60 bpm. Exam Location:  Inpatient Procedure: 2D Echo Indications:    Acute diastolic CHF  History:        Patient has no prior history of Echocardiogram examinations.                 COPD.   Sonographer:    Arlyss Gandy Referring Phys: [place]  1. Left ventricular ejection fraction, by estimation, is 50 to 55%. The left ventricle has low normal function. The left ventricle has no regional wall motion abnormalities. Left ventricular diastolic parameters are consistent with Grade I diastolic dysfunction (impaired relaxation).  2. Right ventricular systolic function is normal. The right ventricular size is normal. There is mildly elevated pulmonary artery systolic pressure. The estimated right ventricular systolic pressure is 123456 mmHg.  3. The mitral valve is normal in structure. No evidence of mitral valve regurgitation.  4. The aortic valve was not well visualized. Aortic valve regurgitation is not visualized. No aortic stenosis is present.  5. The inferior vena cava is dilated in size with >50% respiratory variability, suggesting right atrial pressure of 8 mmHg. Comparison(s): No prior Echocardiogram. FINDINGS  Left Ventricle: Left ventricular ejection fraction, by estimation, is 50 to 55%. The left ventricle has low normal function. The left ventricle has no regional wall motion abnormalities. The left ventricular internal cavity  size was normal in size. There is no left ventricular hypertrophy. Left ventricular diastolic parameters are consistent with Grade I diastolic dysfunction (impaired relaxation). Right Ventricle: The right ventricular size is normal. No increase in right ventricular wall thickness. Right ventricular systolic function is normal. There is mildly elevated pulmonary artery systolic pressure. The tricuspid regurgitant velocity is 2.69  m/s, and with an assumed right atrial pressure of 8 mmHg, the estimated right ventricular systolic pressure is 123456 mmHg. Left Atrium: Left atrial size was normal in size. Right Atrium: Right atrial size was normal in size. Pericardium: Trivial pericardial effusion is present. Presence of epicardial fat layer. Mitral Valve:  The mitral valve is normal in structure. No evidence of mitral valve regurgitation. Tricuspid Valve: The tricuspid valve is normal in structure. Tricuspid valve regurgitation is mild . No evidence of tricuspid stenosis. Aortic Valve: The aortic valve was not well visualized. Aortic valve regurgitation is not visualized. No aortic stenosis is present. Aortic valve mean gradient measures 4.0 mmHg. Aortic valve peak gradient measures 7.5 mmHg. Aortic valve area, by VTI measures 1.97 cm. Pulmonic Valve: The pulmonic valve was not well visualized. Pulmonic valve regurgitation is not visualized. No evidence of pulmonic stenosis. Aorta: The aortic root and ascending aorta are structurally normal, with no evidence of dilitation. Venous: The inferior vena cava is dilated in size with greater than 50% respiratory variability, suggesting right atrial pressure of 8 mmHg. IAS/Shunts: No atrial level shunt detected by color flow Doppler.  LEFT VENTRICLE PLAX 2D LVIDd:         5.30 cm   Diastology LVIDs:         4.80 cm   LV e' medial:    10.40 cm/s LV PW:         0.70 cm   LV E/e' medial:  6.2 LV IVS:        0.70 cm   LV e' lateral:   13.10 cm/s LVOT diam:     2.00 cm   LV E/e' lateral: 4.9 LV SV:         51 LV SV Index:   26 LVOT Area:     3.14 cm  RIGHT VENTRICLE             IVC RV Basal diam:  3.00 cm     IVC diam: 2.30 cm RV S prime:     12.20 cm/s TAPSE (M-mode): 1.6 cm LEFT ATRIUM             Index        RIGHT ATRIUM           Index LA diam:        2.80 cm 1.46 cm/m   RA Area:     11.40 cm LA Vol (A2C):   30.8 ml 16.09 ml/m  RA Volume:   24.80 ml  12.96 ml/m LA Vol (A4C):   41.3 ml 21.58 ml/m LA Biplane Vol: 38.7 ml 20.22 ml/m  AORTIC VALVE AV Area (Vmax):    1.94 cm AV Area (Vmean):   1.85 cm AV Area (VTI):     1.97 cm AV Vmax:           137.00 cm/s AV Vmean:          92.200 cm/s AV VTI:            0.257 m AV Peak Grad:      7.5 mmHg AV Mean Grad:      4.0 mmHg LVOT Vmax:  84.50 cm/s LVOT Vmean:         54.400 cm/s LVOT VTI:          0.161 m LVOT/AV VTI ratio: 0.63  AORTA Ao Root diam: 2.70 cm Ao Asc diam:  2.30 cm MITRAL VALVE               TRICUSPID VALVE MV Area (PHT): 3.89 cm    TR Peak grad:   28.9 mmHg MV Decel Time: 195 msec    TR Vmax:        269.00 cm/s MV E velocity: 64.70 cm/s MV A velocity: 84.40 cm/s  SHUNTS MV E/A ratio:  0.77        Systemic VTI:  0.16 m                            Systemic Diam: 2.00 cm Rudean Haskell MD Electronically signed by Rudean Haskell MD Signature Date/Time: 08/15/2021/4:01:40 PM    Final    CT EXTREMITY LOWER RIGHT W CONTRAST  Result Date: 08/15/2021 CLINICAL DATA:  Status post trauma to the right leg 1 month ago with posterior weeping wound. EXAM: CT OF THE LOWER RIGHT EXTREMITY WITH CONTRAST TECHNIQUE: Multidetector CT imaging of the lower right extremity was performed according to the standard protocol following intravenous contrast administration. RADIATION DOSE REDUCTION: This exam was performed according to the departmental dose-optimization program which includes automated exposure control, adjustment of the mA and/or kV according to patient size and/or use of iterative reconstruction technique. CONTRAST:  59mL OMNIPAQUE IOHEXOL 350 MG/ML SOLN COMPARISON:  None. FINDINGS: Bones/Joint/Cartilage There is no evidence of acute fracture or dislocation. Marked severity medial and lateral marginal osteophytes are present. Marked severity medial and lateral tibiofemoral joint space narrowing is seen. Marked severity patellofemoral narrowing is also noted. A small joint effusion is present. Ligaments Suboptimally assessed by CT. Muscles and Tendons Moderate severity perimuscular inflammatory fat stranding is seen along the posterior aspect of the proximal to mid right lower extremity. Soft tissues Approximately 3.8 cm x 1.7 cm x 1.6 cm and 3.4 cm x 2.5 cm x 1.4 cm soft tissue defects are seen along the posterior aspect of the proximal to mid right lower  extremity. These defects both extend to the level of the musculature of the posterior right leg. Marked severity adjacent deep and subcutaneous inflammatory fat stranding is also seen which extends inferiorly along the posterior aspect of the right leg to the distal aspect of the right calf. Cutaneous thickening is also present. Moderate to marked severity subcutaneous inflammatory fat stranding is also seen along the anterior aspects of the mid and distal right tibia and right fibula. No organized fluid collections or abscesses are identified. IMPRESSION: 1. Large soft tissue defects along the posterior aspect of the proximal right leg with associated marked severity cellulitis, as described above. 2. Marked severity cellulitis within the soft tissues anterior to the right tibia and right fibula. 3. No evidence of an organized fluid collection or abscess. 4. Marked severity chronic and degenerative changes involving the right knee. 5. Small joint effusion. Electronically Signed   By: Virgina Norfolk M.D.   On: 08/15/2021 03:21   [place] EKG SITE RITE  Result Date: 08/15/2021 If Site Rite image not attached, placement could not be confirmed due to current cardiac rhythm.  [place] EKG SITE RITE  Result Date: 08/15/2021 If Site Rite image not attached, placement could not be confirmed due to current cardiac rhythm.

## 2021-08-17 DIAGNOSIS — L8990 Pressure ulcer of unspecified site, unspecified stage: Secondary | ICD-10-CM | POA: Insufficient documentation

## 2021-08-17 LAB — CBC
HCT: 31.1 % — ABNORMAL LOW (ref 36.0–46.0)
Hemoglobin: 9.1 g/dL — ABNORMAL LOW (ref 12.0–15.0)
MCH: 24.9 pg — ABNORMAL LOW (ref 26.0–34.0)
MCHC: 29.3 g/dL — ABNORMAL LOW (ref 30.0–36.0)
MCV: 85 fL (ref 80.0–100.0)
Platelets: 800 10*3/uL — ABNORMAL HIGH (ref 150–400)
RBC: 3.66 MIL/uL — ABNORMAL LOW (ref 3.87–5.11)
RDW: 21.5 % — ABNORMAL HIGH (ref 11.5–15.5)
WBC: 14.2 10*3/uL — ABNORMAL HIGH (ref 4.0–10.5)
nRBC: 0 % (ref 0.0–0.2)

## 2021-08-17 LAB — GLUCOSE, CAPILLARY
Glucose-Capillary: 163 mg/dL — ABNORMAL HIGH (ref 70–99)
Glucose-Capillary: 156 mg/dL — ABNORMAL HIGH (ref 70–99)
Glucose-Capillary: 155 mg/dL — ABNORMAL HIGH (ref 70–99)
Glucose-Capillary: 162 mg/dL — ABNORMAL HIGH (ref 70–99)
Glucose-Capillary: 157 mg/dL — ABNORMAL HIGH (ref 70–99)
Glucose-Capillary: 132 mg/dL — ABNORMAL HIGH (ref 70–99)

## 2021-08-17 LAB — COMPREHENSIVE METABOLIC PANEL
ALT: 12 U/L (ref 0–44)
AST: 14 U/L — ABNORMAL LOW (ref 15–41)
Albumin: 2 g/dL — ABNORMAL LOW (ref 3.5–5.0)
Alkaline Phosphatase: 78 U/L (ref 38–126)
Anion gap: 8 (ref 5–15)
BUN: 8 mg/dL (ref 8–23)
CO2: 26 mmol/L (ref 22–32)
Calcium: 8.6 mg/dL — ABNORMAL LOW (ref 8.9–10.3)
Chloride: 100 mmol/L (ref 98–111)
Creatinine, Ser: 0.76 mg/dL (ref 0.44–1.00)
GFR, Estimated: >60 mL/min (ref >60)
Glucose, Bld: 184 mg/dL — ABNORMAL HIGH (ref 70–99)
Potassium: 4.6 mmol/L (ref 3.5–5.1)
Sodium: 134 mmol/L — ABNORMAL LOW (ref 135–145)
Total Bilirubin: 0.3 mg/dL (ref 0.3–1.2)
Total Protein: 6.3 g/dL — ABNORMAL LOW (ref 6.5–8.1)

## 2021-08-17 LAB — TROPONIN I (HIGH SENSITIVITY)
Troponin I (High Sensitivity): 424 ng/L (ref <18)
Troponin I (High Sensitivity): 390 ng/L (ref <18)
Troponin I (High Sensitivity): 362 ng/L (ref <18)

## 2021-08-17 LAB — MAGNESIUM: Magnesium: 1.7 mg/dL (ref 1.7–2.4)

## 2021-08-17 LAB — TSH: TSH: 3.003 u[IU]/mL (ref 0.350–4.500)

## 2021-08-17 LAB — PHOSPHORUS: Phosphorus: 3.6 mg/dL (ref 2.5–4.6)

## 2021-08-17 LAB — T4, FREE: Free T4: 0.59 ng/dL — ABNORMAL LOW (ref 0.61–1.12)

## 2021-08-17 LAB — PROCALCITONIN: Procalcitonin: <0.1 ng/mL

## 2021-08-17 LAB — LACTIC ACID, PLASMA: Lactic Acid, Venous: 2.1 mmol/L (ref 0.5–1.9)

## 2021-08-17 MED ORDER — DAKINS (1/4 STRENGTH) 0.125 % EX SOLN
Freq: Two times a day (BID) | CUTANEOUS | Status: AC
  Administered 2021-08-20 – 2021-08-21 (×2): 1 via TOPICAL
  Filled 2021-08-17 (×5): qty 473

## 2021-08-17 MED ORDER — MAGNESIUM SULFATE 2 GM/50ML IV SOLN
2.0000 g | Freq: Once | INTRAVENOUS | Status: AC
  Administered 2021-08-17: 2 g via INTRAVENOUS
  Filled 2021-08-17: qty 50

## 2021-08-17 MED ORDER — FLUDROCORTISONE ACETATE 0.1 MG PO TABS
0.1000 mg | ORAL_TABLET | Freq: Every day | ORAL | Status: DC
  Administered 2021-08-17 – 2021-08-27 (×11): 0.1 mg via ORAL
  Filled 2021-08-17 (×11): qty 1

## 2021-08-17 MED ORDER — COLLAGENASE 250 UNIT/GM EX OINT
TOPICAL_OINTMENT | Freq: Every day | CUTANEOUS | Status: DC
  Filled 2021-08-17 (×4): qty 30

## 2021-08-17 MED ORDER — ALPRAZOLAM 1 MG PO TABS
1.0000 mg | ORAL_TABLET | Freq: Three times a day (TID) | ORAL | Status: DC | PRN
  Administered 2021-08-17 – 2021-09-04 (×39): 1 mg via ORAL
  Filled 2021-08-17 (×40): qty 1

## 2021-08-17 MED ORDER — LEVOTHYROXINE SODIUM 150 MCG PO TABS
150.0000 ug | ORAL_TABLET | Freq: Every day | ORAL | Status: DC
  Administered 2021-08-18 – 2021-09-04 (×18): 150 ug via ORAL
  Filled 2021-08-17 (×4): qty 2
  Filled 2021-08-17: qty 1
  Filled 2021-08-17 (×5): qty 2
  Filled 2021-08-17: qty 1
  Filled 2021-08-17 (×6): qty 2
  Filled 2021-08-17: qty 1

## 2021-08-17 MED ORDER — ASPIRIN 81 MG PO CHEW
81.0000 mg | CHEWABLE_TABLET | Freq: Every day | ORAL | Status: DC
  Administered 2021-08-17 – 2021-09-04 (×19): 81 mg via ORAL
  Filled 2021-08-17 (×19): qty 1

## 2021-08-17 MED ORDER — ALPRAZOLAM 1 MG PO TABS
1.0000 mg | ORAL_TABLET | Freq: Once | ORAL | Status: AC
  Administered 2021-08-17: 1 mg via ORAL
  Filled 2021-08-17: qty 1

## 2021-08-17 NOTE — TOC Initial Note (Signed)
Transition of Care [facility]) - Initial/Assessment Note    Patient Details  Name: [name] MRN: [id number] Date of Birth: 1955/08/30  Transition of Care [facility]) CM/SW Contact:    Golda Acre, RN Phone Number: 08/17/2021, 8:01 AM  Clinical Narrative:                  Transition of Care [facility]) Screening Note   Patient Details  Name: [name] Date of Birth: 10-04-1955   Transition of Care [facility]) CM/SW Contact:    Golda Acre, RN Phone Number: 08/17/2021, 8:01 AM    Transition of Care Department [facility]) has reviewed patient and no TOC needs have been identified at this time. We will continue to monitor patient advancement through interdisciplinary progression rounds. If new patient transition needs arise, please place a TOC consult.    Expected Discharge Plan: Home/Self Care Barriers to Discharge: Continued Medical Work up   Patient Goals and CMS Choice Patient states their goals for this hospitalization and ongoing recovery are:: TO GO HOME CMS Medicare.gov Compare Post Acute Care list provided to:: Patient    Expected Discharge Plan and Services Expected Discharge Plan: Home/Self Care   Discharge Planning Services: CM Consult   Living arrangements for the past 2 months: Single Family Home                                      Prior Living Arrangements/Services Living arrangements for the past 2 months: Single Family Home Lives with:: Self Patient language and need for interpreter reviewed:: Yes Do you feel safe going back to the place where you live?: Yes            Criminal Activity/Legal Involvement Pertinent to Current Situation/Hospitalization: No - Comment as needed  Activities of Daily Living      Permission Sought/Granted                  Emotional Assessment Appearance:: Appears stated age Attitude/Demeanor/Rapport: Engaged Affect (typically observed): Calm Orientation: : Oriented to Self, Oriented to Place, Oriented  to  Time, Oriented to Situation Alcohol / Substance Use: Not Applicable Psych Involvement: No (comment)  Admission diagnosis:  Cellulitis of right lower extremity [L03.115] Septic shock (HCC) [A41.9, R65.21] Sepsis, due to unspecified organism, unspecified whether acute organ dysfunction present [facility]) [A41.9] Patient Active Problem List   Diagnosis Date Noted   Sepsis (HCC) 08/15/2021   Septic shock (HCC) 08/15/2021   DNR (do not resuscitate) 08/15/2021   Cellulitis of right lower extremity    Wound cellulitis 06/16/2021   Hypothyroidism 06/16/2021   HLD (hyperlipidemia) 06/16/2021   Anxiety 06/16/2021   Fall at home, initial encounter 06/16/2021   Adult failure to thrive 06/16/2021   Arm mass, right 03/04/2021   Acute respiratory failure with hypoxia (HCC) 03/04/2021   COPD exacerbation (HCC) 03/01/2021   Acute exacerbation of chronic obstructive pulmonary disease (COPD) (HCC) 02/28/2021   Pyelonephritis 04/04/2018   Depression 04/04/2018   Venous insufficiency (chronic) (peripheral) 01/11/2018   BRONCHITIS, ACUTE 10/21/2008   Essential hypertension 07/13/2007   Asthma 07/11/2007   GERD (gastroesophageal reflux disease) 07/11/2007   SLEEP APNEA 07/11/2007   Shortness of breath 07/11/2007   COUGH 07/11/2007   PCP:  Rodrigo Ran, MD Pharmacy:   [facility]- [place], [place] - [address] [place] [address] Phone: [phone] Fax: [phone]  [facility] [id number] [place], [place] - [address] [place] [address] Phone: [phone] Fax: [phone]     Social Determinants of Health (SDOH) Interventions    Readmission Risk Interventions No flowsheet data found.

## 2021-08-17 NOTE — Plan of Care (Signed)
°  Problem: Clinical Measurements: Goal: Will remain free from infection Outcome: Progressing Goal: Diagnostic test results will improve Outcome: Progressing Goal: Respiratory complications will improve Outcome: Progressing Goal: Cardiovascular complication will be avoided Outcome: Progressing   Problem: Activity: Goal: Risk for activity intolerance will decrease Outcome: Progressing   Problem: Nutrition: Goal: Adequate nutrition will be maintained Outcome: Progressing   Problem: Coping: Goal: Level of anxiety will decrease Outcome: Progressing   Problem: Elimination: Goal: Will not experience complications related to bowel motility Outcome: Progressing Goal: Will not experience complications related to urinary retention Outcome: Progressing   Cindy S. Clelia Croft BSN, RN, CCRP 08/17/2021 4:53 AM

## 2021-08-17 NOTE — Progress Notes (Signed)
Pharmacy: electrolyte replacement Mag 1.7 Plan: 2 gm IVPB per e link protocol  Herby Abraham, Pharm.D 08/17/2021 11:04 AM

## 2021-08-17 NOTE — Progress Notes (Addendum)
eLink Physician-Brief Progress Note Patient Name: [name] DOB: 1955/12/08 MRN: [id number]   Date of Service  08/17/2021  HPI/Events of Note  Troponin = 424.  eICU Interventions  Plan: 12 Lead EKG STAT. ASA 81 mg PO now and Q day.  Continue to cycle Troponin.     Intervention Category Major Interventions: Other:  Lenell Antu 08/17/2021, 5:52 AM

## 2021-08-17 NOTE — Consult Note (Signed)
[place] Nurse Consult Note: Patient receiving care in [facility] ICU 1222.  Primary RN, Aleene Davidson, medicated the patient prior to my assessment and assisted with positioning for viewing of areas. Reason for Consult: wounds to sacrum, thigh, foot Wound type: Please see photos in the record. The right heel wound measures 9 cm x 4 cm x 0.4 cm. It has heavy tan/grey drainage. Slightly above the right lateral malleolus is a partial thickness pink area surrounded by healed wound area. The right posterior thigh wound measures roughly 18 cm x 15 cm x 5 cm depth at the trochanter region.  It feels as if it may probe to the bone. The right ischial area measures 4.8 cm x 10 cm x 5.1 cm and this may also probe to the bone.  It does not look this deep in the photo, nor when you look at the wound. However, the cotton tipped applicator is easily inserted this deep. At the sacral/coccyx area there is a pink wound that measures 2.3 cm x 3 cm and is more consistent with a stage 2 PI, compared with the stage 4 PIs in the photographs Pressure Injury POA: Yes Measurement: Wound bed: see photos.  These photos are highly representative of the appearance of the wounds today. Drainage (amount, consistency, odor)  Periwound: Dressing procedure/placement/frequency: I have placed an order for 1/4% Dakin's solution to all wounds, bid, for 7 days.  I also discussed via telephone with Dr. Renaldo Harrison, that it could be helpful to have a surgical eval of the wounds.  I am concerned that none of these wounds are going to heal, even if the patient has something as aggressive a a hip disarticulation.  Surgical expertise and guidance would be greatly appreciated.  [place] nurse will not follow at this time.  Please re-consult the [place] team if needed.  Val Riles, RN, MSN, CWOCN, CNS-BC, pager [phone]

## 2021-08-17 NOTE — Evaluation (Signed)
Physical Therapy Evaluation Patient Details Name: [name] MRN: [id number] DOB: 1956-03-11 Today's Date: 08/17/2021  History of Present Illness  [name] with past medical history of COPD on 2 L of oxygen, gout, GERD,chronic lower extremity venous insufficiency. Admitted 1/13.2023 with worsening, draining  right posterior thigh wound and right foot wounds.  Clinical Impression  Evaluation limited to bed level due to severity of the right leg wounds and patient  reporting significant pain  when repositioning onto left side and moving the leg..   Patient reports that in the recent past , she fell and had to slide across wood floor and may have gotten the thigh wound at the time. Continue PT for safe mobility, avoiding  pressure on the thigh and foot  wounds as possible.  Pt admitted with above diagnosis.  Pt currently with functional limitations due to the deficits listed below (see PT Problem List). Pt will benefit from skilled PT to increase their independence and safety with mobility to allow discharge to the venue listed below.    RECOMMEND PREVALON BOOTS  FOR BOTH FEET.PLEASE ORDER.         Recommendations for follow up therapy are one component of a multi-disciplinary discharge planning process, led by the attending physician.  Recommendations may be updated based on patient status, additional functional criteria and insurance authorization.  Follow Up Recommendations Skilled nursing-short term rehab (<3 hours/day)    Assistance Recommended at Discharge Frequent or constant Supervision/Assistance  Patient can return home with the following  Two people to help with walking and/or transfers;A lot of help with bathing/dressing/bathroom;Assistance with cooking/housework;Assist for transportation;Direct supervision/assist for medications management;Help with stairs or ramp for entrance;Direct supervision/assist for financial management    Equipment  Recommendations None recommended by PT  Recommendations for Other Services       Functional Status Assessment Patient has had a recent decline in their functional status and demonstrates the ability to make significant improvements in function in a reasonable and predictable amount of time.     Precautions / Restrictions Precautions Precautions: Fall Precaution Comments: right posterior thigh wound, right foot(plantar heel), buttock wounds      Mobility  Bed Mobility Overal bed mobility: Needs Assistance Bed Mobility: Rolling Rolling: Min assist         General bed mobility comments: support the right leg to roll to the left, support right leg, pillow placed between legs.    Transfers                   General transfer comment: NT, reports significant pain on right thigh    Ambulation/Gait                  Stairs            Wheelchair Mobility    Modified Rankin (Stroke Patients Only)       Balance                                             Pertinent Vitals/Pain Pain Assessment: Faces Faces Pain Scale: Hurts whole lot Pain Location: right thigh"sticking to dressing" when lifts leg, right heel. Pain Descriptors / Indicators: Discomfort;Grimacing;Guarding;Moaning Pain Intervention(s): Monitored during session;Limited activity within patient's tolerance;Repositioned    Home Living Family/patient expects to be discharged to:: Private residence Living Arrangements: Alone Available Help at Discharge: Family;Friend(s);Available PRN/intermittently Type  of Home: Mobile home Home Access: Stairs to enter Entrance Stairs-Rails: Can reach both Entrance Stairs-Number of Steps: 4   Home Layout: One level Home Equipment: Rollator (4 wheels);Shower seat;BSC/3in1      Prior Function Prior Level of Function : Needs assist             Mobility Comments: Pt reports using RW at home for support 2* arthritic R knee; patient is  not able to state when she last ambulated. states that she could not tolerate weight on the right leg and "Hopped" ADLs Comments: unsure     Hand Dominance   Dominant Hand: Right    Extremity/Trunk Assessment   Upper Extremity Assessment Upper Extremity Assessment: Overall WFL for tasks assessed    Lower Extremity Assessment Lower Extremity Assessment: RLE deficits/detail RLE Deficits / Details: able to raise the leg from the bed, painful per patient, lacks dorsiflexion to neutrl, dressings on the heel and posterior thigh, pt. states that she cannot feel the  lower leg when touched. RLE: Unable to fully assess due to pain       Communication   Communication: HOH  Cognition Arousal/Alertness: Awake/alert Behavior During Therapy: WFL for tasks assessed/performed Overall Cognitive Status: No family/caregiver present to determine baseline cognitive functioning                                 General Comments: patient has some  timeline lapses for her last time able to ambulate. Patient states that she feel in Br and had to drag self along hard wood floor and may have caused the wound on posterior thigh, ?  when this occurred.        General Comments General comments (skin integrity, edema, etc.): NT    Exercises     Assessment/Plan    PT Assessment Patient needs continued PT services  PT Problem List Decreased strength;Decreased mobility;Decreased safety awareness;Decreased knowledge of precautions;Decreased range of motion;Decreased activity tolerance;Decreased skin integrity;Decreased balance;Decreased knowledge of use of DME;Pain       PT Treatment Interventions DME instruction;Therapeutic activities;Gait training;Therapeutic exercise;Patient/family education;Functional mobility training;Wheelchair mobility training    PT Goals (Current goals can be found in the Care Plan section)  Acute Rehab PT Goals Patient Stated Goal: i hope i can walk PT Goal  Formulation: With patient Time For Goal Achievement: 08/31/21 Potential to Achieve Goals: Fair    Frequency Min 2X/week     Co-evaluation               AM-PAC PT "6 Clicks" Mobility  Outcome Measure Help needed turning from your back to your side while in a flat bed without using bedrails?: A Little Help needed moving from lying on your back to sitting on the side of a flat bed without using bedrails?: A Lot Help needed moving to and from a bed to a chair (including a wheelchair)?: Total Help needed standing up from a chair using your arms (e.g., wheelchair or bedside chair)?: Total Help needed to walk in hospital room?: Total Help needed climbing 3-5 steps with a railing? : Total 6 Click Score: 9    End of Session   Activity Tolerance: Patient limited by pain Patient left: in bed;with call bell/phone within reach;with bed alarm set Nurse Communication: Mobility status PT Visit Diagnosis: Unsteadiness on feet (R26.81);Muscle weakness (generalized) (M62.81);Pain;History of falling (Z91.81) Pain - Right/Left: Right Pain - part of body: Leg    Time:  [id number] PT Time Calculation (min) (ACUTE ONLY): 23 min   Charges:   PT Evaluation $PT Eval Low Complexity: 1 Low PT Treatments $Therapeutic Activity: 8-22 mins        Tresa Endo PT Acute Rehabilitation Services Pager [phone] Office [phone]   Claretha Cooper 08/17/2021, 1:37 PM

## 2021-08-17 NOTE — Plan of Care (Signed)
Patient remains on levophed; weaning per order as tolerated. Anticipating WOC assessment today.    Problem: Education: Goal: Knowledge of General Education information will improve Description: Including pain rating scale, medication(s)/side effects and non-pharmacologic comfort measures Outcome: Progressing   Problem: Health Behavior/Discharge Planning: Goal: Ability to manage health-related needs will improve Outcome: Progressing   Problem: Clinical Measurements: Goal: Ability to maintain clinical measurements within normal limits will improve Outcome: Progressing Goal: Will remain free from infection Outcome: Progressing Goal: Diagnostic test results will improve Outcome: Progressing Goal: Respiratory complications will improve Outcome: Progressing Goal: Cardiovascular complication will be avoided Outcome: Progressing   Problem: Activity: Goal: Risk for activity intolerance will decrease Outcome: Progressing   Problem: Nutrition: Goal: Adequate nutrition will be maintained Outcome: Progressing   Problem: Coping: Goal: Level of anxiety will decrease Outcome: Progressing   Problem: Elimination: Goal: Will not experience complications related to bowel motility Outcome: Progressing Goal: Will not experience complications related to urinary retention Outcome: Progressing   Problem: Pain Managment: Goal: General experience of comfort will improve Outcome: Progressing   Problem: Safety: Goal: Ability to remain free from injury will improve Outcome: Progressing   Problem: Skin Integrity: Goal: Risk for impaired skin integrity will decrease Outcome: Progressing

## 2021-08-17 NOTE — Progress Notes (Signed)
eLink Physician-Brief Progress Note Patient Name: [name] DOB: 26-May-1956 MRN: [id number]   Date of Service  08/17/2021  HPI/Events of Note  Anxiety = Patient request that her home Xanax 1 mg PO TID not ordered when patient was admitted be restarted.  eICU Interventions  Plan: Xanax 1 mg PO now. PCCM rounding team to decide if they desire to restart home Xanax.      Intervention Category Major Interventions: Other:  [name] 08/17/2021, 2:06 AM

## 2021-08-17 NOTE — Progress Notes (Signed)
Full consult note to follow, but wound assessed.  No surgical intervention warranted.  Santyl to necrotic tissue and continue current local wound care.  No bone is palpable.  D/w primary service  Letha Cape 4:47 PM 08/17/2021

## 2021-08-17 NOTE — Progress Notes (Signed)
NUTRITION NOTE  Consult for Calorie Count received. Calorie Count initiated.  RD will see for full assessment and Calorie Count day #1 results on 1/17.     Jarome Matin, MS, RD, LDN Inpatient Clinical Dietitian RD pager # available in [place]  After hours/weekend pager # available in [facility]

## 2021-08-17 NOTE — Progress Notes (Signed)
NAME:  Connie MCEUEN, MRN:  [id number], DOB:  March 31, 1956, LOS: 2 ADMISSION DATE:  08/14/2021, CONSULTATION DATE: August 15, 2021 REFERRING MD: ED, CHIEF COMPLAINT: Leg pain and swelling  BRIEF  [name] with past medical history of COPD on 2 L of oxygen patient is with GERD chronic lower extremity venous insufficiency edema wound was admitted to the hospital 2 months ago with what seems to be chronic ulcer she was supposed to follow-up with wound care and go to rehab but she ended up going to her home never followed up in the wound In the ED arrest patient was found also to have bedbugs She was given 3 L of fluid continue to be hypotensive required low-dose Levophed at 6 mics per minute. Patient had no fever chills rigors no nausea vomiting diarrhea no chest pain no abdominal pain she has significant pain in the back of her right thigh with a swelling of her right lower extremity   PAST   has a past medical history of COPD (chronic obstructive pulmonary disease) ([place]), Gout, and Hypercholesteremia.   has a past surgical history that includes Abdominal hysterectomy and Rotator cuff repair.    EVENTS   08/15/2021 -  repeat rounds: History review shows admission 06/16/2021 - 06/23/2021 with history of COPD on 2 L, depression on Wellbutrin and venlafaxine and anxiety on alprazolam, acid reflux hypothyroidism chronic lower extremity wounds [follows with Dr. Duda] hyperlipidemia, failure to thrive self-neglect with evidence of home infestation by bedbugs and fleas in November 2022, limited mobility using walker, that admission was for sepsis secondary to bilateral lower extremity cellulitis.  And stage II sacral decub.  She consistently refused dressing changes.  Patient discharged to [facility] to recent [place] home for antibiotics and dressing changes but subsequently went back to her house which according to the patient because she was ambulatory.  However according to the  cousin patient has been with a cousin all along and has refused to participate in care and also has been having failure to thrive with diminished eating.  The cousin is asking for placement.  The cousin is supportive of her DNR and DNI  -1/15 Levophed 9 mcg with good blood pressure.  She is awake and interactive.  She has significant pain in her lower extremities.  RN describes significant right thigh posterior ulcer and right foot ulcer and sacral decub all present on admission.    SUBJECTIVE/OVERNIGHT/INTERVAL HX  Received PTA xanax overnight. This morning she complains of RLE pain, better with pan meds.  Objective   Blood pressure (!) 100/50, pulse 70, temperature 97.7 F (36.5 C), temperature source Axillary, resp. rate 11, height 5\' 4"  (1.626 m), weight 83.8 kg, SpO2 99 %.        Intake/Output Summary (Last 24 hours) at 08/17/2021 0830 Last data filed at 08/17/2021 0700 Gross per 24 hour  Intake 1714.61 ml  Output 3250 ml  Net -1535.39 ml    Filed Weights   08/14/21 2156 08/15/21 0002 08/17/21 0430  Weight: 37.2 kg 86.2 kg 83.8 kg    General Appearance:  frail, chronically ill appearing woman lying in bed in NAD HEENT: [place]/AT, eyes anicteric, edentulous, oral mucosa moist. Neck:  supple Lungs: Breathing comfortably on room air, clear to auscultation bilaterally. Heart: S1-S2, regular rate and rhythm Abdomen: Soft, nontender, nondistended Extremities: Bilateral lower extremity edema, chronic venous stasis changes, erythema on the right leg. No cyanosis Skin: Warm, dry, erythema and warmth of the right leg, chronic venous  stasis changes neurologic: Awake, alert, oriented to person and place.  Globally weak but moving all extremities  Trop 424>390>362 LA 2.1, downtrending BBC 14.2 H/H 9.1/31.1 Platelets 800 TSH 3.003 Free T4 0.59   Assessment & Plan:   Septic shock due to cellulitis RLE -Continue Vanco and Zosyn - Continue norepinephrine as required to maintain MAP  greater than 65.  Continue midodrine. - Continue stress dose steroids.  And fludrocortisone  COPD not acutely exacerbated. Not currently requiring oxygen and doesn't use at home despite previous prescription for home oxygen. -Continue DuoNebs twice daily - Recommend outpatient PFTs.  Consider starting LAMA daily  Decubitus ulcers-- R foot, R thigh, sacrum -WOC consult   Anxiety Chronic opiate use for chronic LE pain -Con't Gabapentin, Wellbutrin, Effexor -ok to resume PTA xanax; 1 mg TID PRN  Mild hyponatremia  -monitor -avoid excessive free water  GERD -con't PTA PPI   Anemia of chronic disease- acute on chronic  -Transfuse for hemoglobin less than 7 or hemodynamically significant bleeding - Monitor  Reactive thrombocytosis due to sepsis -Monitor   Prediabetes, hyperglycemia - Sliding scale insulin -Goal BG 140-180  Hypothyroidism - Resume PTA Synthroid  Failure to thrive   Self-neglect with poor motivation Immobility using walker  -PT, OT  Severe protein calorie malnutrition  cachexia -Encourage adequate p.o. intake - RD consult for calorie count  Poor social situation with bedbugs and fleas in the house  Noncompliance with medical instructions -Need social work assistance with placement   Best practice:  Diet: Carb modified Pain/Anxiety/Delirium protocol (if indicated): VAP protocol (if indicated):  DVT prophylaxis: Lovenox GI prophylaxis: PPI Glucose control: SSI Mobility: ambulate with assistance   Code Status: DNR (08/15/2021 at admission: Cousin is in favor of DNR.  Also spoke independently to the patient.  Explained to the patient that we are trying to get her better but in the process if her heart stops or her breathing stops then natural death has occurred.  Expressed to the patient that in the event this was to happen would be recognized this is natural death or try to intervene in terms of intubation or CPR.  Expressed to the patient that given  her medical conditions probably not in her best interest to go through CPR and intubation.  Patient states she was aligned with this and confirmed DNR/DNI.  Communicated this with nurse Ebony Hail ). Family Communication: Called [name] relative [id number] -> cousin reports that she is very difficult and challenging to take care of.   Cousin is requesting placement in a nursing home.  Cousin is in favor of DNR  Disposition: ICU     LABS    PULMONARY Recent Labs  Lab 08/15/21 1423  PHART 7.436  PCO2ART 37.1  PO2ART 79.4*  HCO3 24.5  O2SAT 95.6     CBC Recent Labs  Lab 08/15/21 0742 08/16/21 0500 08/17/21 0110  HGB 9.2* 9.4* 9.1*  HCT 30.4* 31.7* 31.1*  WBC 13.1* 11.0* 14.2*  PLT 765* 791* 800*     COAGULATION No results for input(s): INR in the last 168 hours.  CARDIAC  No results for input(s): TROPONINI in the last 168 hours. No results for input(s): PROBNP in the last 168 hours.   CHEMISTRY Recent Labs  Lab 08/15/21 0006 08/15/21 0742 08/15/21 1234 08/16/21 0500 08/17/21 0422  NA 128*  --   --  134* 134*  K 4.0  --   --  3.4* 4.6  CL 93*  --   --  102  100  CO2 22  --   --  26 26  GLUCOSE 86  --   --  145* 184*  BUN 17  --   --  9 8  CREATININE 0.90 0.70  --  0.67 0.76  CALCIUM 8.9  --   --  8.2* 8.6*  MG  --   --  2.4  --  1.7  PHOS  --   --   --  3.8 3.6    Estimated Creatinine Clearance: 74.4 mL/min (by C-G formula based on SCr of 0.76 mg/dL).   This patient is critically ill with multiple organ system failure which requires frequent high complexity decision making, assessment, support, evaluation, and titration of therapies. This was completed through the application of advanced monitoring technologies and extensive interpretation of multiple databases. During this encounter critical care time was devoted to patient care services described in this note for 36 minutes.  Julian Hy, DO 08/17/21 10:03 AM [facility]

## 2021-08-18 ENCOUNTER — Encounter (HOSPITAL_COMMUNITY): Payer: Self-pay | Admitting: Internal Medicine

## 2021-08-18 DIAGNOSIS — L03317 Cellulitis of buttock: Secondary | ICD-10-CM

## 2021-08-18 LAB — COMPREHENSIVE METABOLIC PANEL
ALT: 10 U/L (ref 0–44)
AST: 11 U/L — ABNORMAL LOW (ref 15–41)
Albumin: 2 g/dL — ABNORMAL LOW (ref 3.5–5.0)
Alkaline Phosphatase: 63 U/L (ref 38–126)
Anion gap: 5 (ref 5–15)
BUN: 9 mg/dL (ref 8–23)
CO2: 31 mmol/L (ref 22–32)
Calcium: 8.2 mg/dL — ABNORMAL LOW (ref 8.9–10.3)
Chloride: 98 mmol/L (ref 98–111)
Creatinine, Ser: 0.6 mg/dL (ref 0.44–1.00)
GFR, Estimated: >60 mL/min (ref >60)
Glucose, Bld: 136 mg/dL — ABNORMAL HIGH (ref 70–99)
Potassium: 4 mmol/L (ref 3.5–5.1)
Sodium: 134 mmol/L — ABNORMAL LOW (ref 135–145)
Total Bilirubin: 0.3 mg/dL (ref 0.3–1.2)
Total Protein: 5.8 g/dL — ABNORMAL LOW (ref 6.5–8.1)

## 2021-08-18 LAB — CBC WITH DIFFERENTIAL/PLATELET
Abs Immature Granulocytes: 0.27 10*3/uL — ABNORMAL HIGH (ref 0.00–0.07)
Basophils Absolute: 0 10*3/uL (ref 0.0–0.1)
Basophils Relative: 0 %
Eosinophils Absolute: 0 10*3/uL (ref 0.0–0.5)
Eosinophils Relative: 0 %
HCT: 29.4 % — ABNORMAL LOW (ref 36.0–46.0)
Hemoglobin: 8.6 g/dL — ABNORMAL LOW (ref 12.0–15.0)
Immature Granulocytes: 2 %
Lymphocytes Relative: 11 %
Lymphs Abs: 1.7 10*3/uL (ref 0.7–4.0)
MCH: 25.2 pg — ABNORMAL LOW (ref 26.0–34.0)
MCHC: 29.3 g/dL — ABNORMAL LOW (ref 30.0–36.0)
MCV: 86.2 fL (ref 80.0–100.0)
Monocytes Absolute: 0.3 10*3/uL (ref 0.1–1.0)
Monocytes Relative: 2 %
Neutro Abs: 13.3 10*3/uL — ABNORMAL HIGH (ref 1.7–7.7)
Neutrophils Relative %: 85 %
Platelets: 651 10*3/uL — ABNORMAL HIGH (ref 150–400)
RBC: 3.41 MIL/uL — ABNORMAL LOW (ref 3.87–5.11)
RDW: 21.7 % — ABNORMAL HIGH (ref 11.5–15.5)
WBC: 15.7 10*3/uL — ABNORMAL HIGH (ref 4.0–10.5)
nRBC: 0 % (ref 0.0–0.2)

## 2021-08-18 LAB — GLUCOSE, CAPILLARY
Glucose-Capillary: 107 mg/dL — ABNORMAL HIGH (ref 70–99)
Glucose-Capillary: 125 mg/dL — ABNORMAL HIGH (ref 70–99)
Glucose-Capillary: 127 mg/dL — ABNORMAL HIGH (ref 70–99)
Glucose-Capillary: 157 mg/dL — ABNORMAL HIGH (ref 70–99)
Glucose-Capillary: 160 mg/dL — ABNORMAL HIGH (ref 70–99)
Glucose-Capillary: 174 mg/dL — ABNORMAL HIGH (ref 70–99)

## 2021-08-18 LAB — MAGNESIUM: Magnesium: 2.3 mg/dL (ref 1.7–2.4)

## 2021-08-18 MED ORDER — ENSURE ENLIVE PO LIQD
237.0000 mL | Freq: Three times a day (TID) | ORAL | Status: DC
  Administered 2021-08-19 – 2021-08-30 (×31): 237 mL via ORAL

## 2021-08-18 MED ORDER — JUVEN PO PACK
1.0000 | PACK | Freq: Two times a day (BID) | ORAL | Status: DC
  Administered 2021-08-18 – 2021-08-31 (×20): 1 via ORAL
  Filled 2021-08-18 (×20): qty 1

## 2021-08-18 MED ORDER — SENNA 8.6 MG PO TABS
1.0000 | ORAL_TABLET | Freq: Every day | ORAL | Status: DC
  Administered 2021-08-18 – 2021-09-02 (×15): 8.6 mg via ORAL
  Filled 2021-08-18 (×17): qty 1

## 2021-08-18 MED ORDER — OXYCODONE HCL 5 MG PO TABS
5.0000 mg | ORAL_TABLET | ORAL | Status: DC | PRN
  Administered 2021-08-18 – 2021-08-19 (×6): 10 mg via ORAL
  Filled 2021-08-18 (×6): qty 2

## 2021-08-18 MED ORDER — POLYETHYLENE GLYCOL 3350 17 G PO PACK
17.0000 g | PACK | Freq: Every day | ORAL | Status: DC
  Administered 2021-08-18 – 2021-08-25 (×7): 17 g via ORAL
  Filled 2021-08-18 (×6): qty 1

## 2021-08-18 NOTE — Consult Note (Addendum)
[name] [birth year]  [id number].    Requesting MD: Dr. Karie Fetch Chief Complaint/Reason for Consult: R leg wound  HPI:  This is a 66 yo female who has a history of COPD on 2L O2 at home with chronic LE insufficiency and wounds followed by Dr. Lajoyce Corners last year.  She has been admitted multiple times in the last several months for evaluation of her wounds.  She lives at home by herself with failure to thrive.  She is unsure how long she has had this posterior R buttock, thigh wound.  She thinks only for a couple of weeks, but this wound has clearly been present longer than a couple of weeks.  She was admitted secondary to some hypotension and a CT scan showed extensive cellulitic changes over her right LE.  She was admitted and has responded well to fluids and abx therapy.  We have been asked to see her for further evaluation of this wound.  ROS: ROS: see HPI, otherwise currently negative.  History reviewed. No pertinent family history.  Past Medical History:  Diagnosis Date   COPD (chronic obstructive pulmonary disease) (HCC)    Gout    Hypercholesteremia     Past Surgical History:  Procedure Laterality Date   ABDOMINAL HYSTERECTOMY     ROTATOR CUFF REPAIR      Social History:  reports that she has been smoking cigarettes. She has a 30.00 pack-year smoking history. She has never used smokeless tobacco. She reports that she does not drink alcohol and does not use drugs.  Allergies:  Allergies  Allergen Reactions   Gemfibrozil     Other reaction(s): Sick to stomach   Nitroglycerin     Other reaction(s): Cough, Irritation   Other Other (See Comments)    Grape fruit juice - unknown   Pravastatin     Other reaction(s): Dizzy   Sulfamethoxazole-Trimethoprim     Other reaction(s): N/V   Tyloxapol     Other reaction(s): Does not work    Medications Prior to Admission  Medication Sig Dispense Refill   ALPRAZolam (XANAX) 1 MG tablet Take 1 mg by mouth 3 (three) times  daily.  0   atorvastatin (LIPITOR) 10 MG tablet Take 10 mg by mouth daily.     buPROPion (WELLBUTRIN XL) 150 MG 24 hr tablet Take 150 mg by mouth every evening.      esomeprazole (NEXIUM) 40 MG capsule Take 40 mg by mouth daily.     gabapentin (NEURONTIN) 300 MG capsule Take 300 mg by mouth 3 (three) times daily.     ipratropium-albuterol (DUONEB) 0.5-2.5 (3) MG/3ML SOLN use 1 vial by nebulization every 4 (four) hours as needed. (Patient taking differently: Take 3 mLs by nebulization every 4 (four) hours as needed (wheezing).) 360 mL 0   levothyroxine (SYNTHROID) 150 MCG tablet Take 150 mcg by mouth daily before breakfast.     oxyCODONE-acetaminophen (PERCOCET/ROXICET) 5-325 MG tablet Take 1 tablet by mouth in the morning, at noon, and at bedtime.     SYMBICORT 160-4.5 MCG/ACT inhaler Inhale 2 puffs into the lungs 3 (three) times daily.  6   venlafaxine XR (EFFEXOR-XR) 150 MG 24 hr capsule Take 150 mg by mouth at bedtime.     liver oil-zinc oxide (DESITIN) 40 % ointment Apply topically 4 (four) times daily. (Patient not taking: Reported on 08/15/2021) 56.7 g 0     Physical Exam: Blood pressure (!) 101/54, pulse 72, temperature 97.9 F (36.6 C), temperature source  Oral, resp. rate 12, height 5\' 4"  (1.626 m), weight 83.8 kg, SpO2 94 %. General: pleasant, chronically ill appearing white female who is laying in bed in NAD HEENT: head is normocephalic, atraumatic.  Sclera are noninjected.  PERRL.  Ears and nose without any masses or lesions.  Mouth is pink and moist with no teeth. Skin: warm and dry, but with a wound present on the posterior aspect of her inferior gluteal crease and upper thigh.  This has several "pockets" that are about 4cm in depth.  Wound exploration reveals no bone palpable.  Most of the wound is actual clean.  There is an area with some necrotic tissue present, but no purulent drainage or evidence of infection.  Psych: A&Ox3 with an appropriate affect.   Results for orders  placed or performed during the hospital encounter of 08/14/21 (from the past 48 hour(s))  Glucose, capillary     Status: Abnormal   Collection Time: 08/16/21  7:52 AM  Result Value Ref Range   Glucose-Capillary 149 (H) 70 - 99 mg/dL    Comment: Glucose reference range applies only to samples taken after fasting for at least 8 hours.  Lactic acid, plasma     Status: Abnormal   Collection Time: 08/16/21 10:34 AM  Result Value Ref Range   Lactic Acid, Venous 2.0 (HH) 0.5 - 1.9 mmol/L    Comment: CRITICAL RESULT CALLED TO, READ BACK BY AND VERIFIED WITH: CALLED MANFIELD,A AT 1109 ON 08/16/2021 BY LUZOLO,P Performed at [facility], [address] [place], [place] [address]   Glucose, capillary     Status: Abnormal   Collection Time: 08/16/21 11:34 AM  Result Value Ref Range   Glucose-Capillary 200 (H) 70 - 99 mg/dL    Comment: Glucose reference range applies only to samples taken after fasting for at least 8 hours.  Cortisol, Random     Status: None   Collection Time: 08/16/21 11:51 AM  Result Value Ref Range   Cortisol, Plasma 14.8 ug/dL    Comment: (NOTE) AM    6.7 - 22.6 ug/dL PM   <19.1<10.0       ug/dL Performed at [facility], [address] [place], [place] [address]   Glucose, capillary     Status: Abnormal   Collection Time: 08/16/21  3:42 PM  Result Value Ref Range   Glucose-Capillary 202 (H) 70 - 99 mg/dL    Comment: Glucose reference range applies only to samples taken after fasting for at least 8 hours.  Glucose, capillary     Status: Abnormal   Collection Time: 08/16/21  7:47 PM  Result Value Ref Range   Glucose-Capillary 164 (H) 70 - 99 mg/dL    Comment: Glucose reference range applies only to samples taken after fasting for at least 8 hours.  Glucose, capillary     Status: Abnormal   Collection Time: 08/16/21 11:51 PM  Result Value Ref Range   Glucose-Capillary 168 (H) 70 - 99 mg/dL    Comment: Glucose reference range applies only to  samples taken after fasting for at least 8 hours.  CBC     Status: Abnormal   Collection Time: 08/17/21  1:10 AM  Result Value Ref Range   WBC 14.2 (H) 4.0 - 10.5 K/uL   RBC 3.66 (L) 3.87 - 5.11 MIL/uL   Hemoglobin 9.1 (L) 12.0 - 15.0 g/dL   HCT 56.231.1 (L) 13.036.0 - 86.546.0 %   MCV 85.0 80.0 - 100.0 fL  MCH 24.9 (L) 26.0 - 34.0 pg   MCHC 29.3 (L) 30.0 - 36.0 g/dL   RDW 16.1 (H) 09.6 - 04.5 %   Platelets 800 (H) 150 - 400 K/uL   nRBC 0.0 0.0 - 0.2 %    Comment: Performed at [facility], [address] [place], [place] [address]  Glucose, capillary     Status: Abnormal   Collection Time: 08/17/21  3:28 AM  Result Value Ref Range   Glucose-Capillary 163 (H) 70 - 99 mg/dL    Comment: Glucose reference range applies only to samples taken after fasting for at least 8 hours.  Procalcitonin     Status: None   Collection Time: 08/17/21  4:22 AM  Result Value Ref Range   Procalcitonin <0.10 ng/mL    Comment:        Interpretation: PCT (Procalcitonin) <= 0.5 ng/mL: Systemic infection (sepsis) is not likely. Local bacterial infection is possible. (NOTE)       Sepsis PCT Algorithm           Lower Respiratory Tract                                      Infection PCT Algorithm    ----------------------------     ----------------------------         PCT < 0.25 ng/mL                PCT < 0.10 ng/mL          Strongly encourage             Strongly discourage   discontinuation of antibiotics    initiation of antibiotics    ----------------------------     -----------------------------       PCT 0.25 - 0.50 ng/mL            PCT 0.10 - 0.25 ng/mL               OR       >80% decrease in PCT            Discourage initiation of                                            antibiotics      Encourage discontinuation           of antibiotics    ----------------------------     -----------------------------         PCT >= 0.50 ng/mL              PCT 0.26 - 0.50 ng/mL               AND         <80% decrease in PCT             Encourage initiation of                                             antibiotics       Encourage continuation           of antibiotics    ----------------------------     -----------------------------  PCT >= 0.50 ng/mL                  PCT > 0.50 ng/mL               AND         increase in PCT                  Strongly encourage                                      initiation of antibiotics    Strongly encourage escalation           of antibiotics                                     -----------------------------                                           PCT <= 0.25 ng/mL                                                 OR                                        > 80% decrease in PCT                                      Discontinue / Do not initiate                                             antibiotics  Performed at [facility], [address], [place], [place] [address]   Comprehensive metabolic panel     Status: Abnormal   Collection Time: 08/17/21  4:22 AM  Result Value Ref Range   Sodium 134 (L) 135 - 145 mmol/L   Potassium 4.6 3.5 - 5.1 mmol/L    Comment: DELTA CHECK NOTED   Chloride 100 98 - 111 mmol/L   CO2 26 22 - 32 mmol/L   Glucose, Bld 184 (H) 70 - 99 mg/dL    Comment: Glucose reference range applies only to samples taken after fasting for at least 8 hours.   BUN 8 8 - 23 mg/dL   Creatinine, Ser 6.04 0.44 - 1.00 mg/dL   Calcium 8.6 (L) 8.9 - 10.3 mg/dL   Total Protein 6.3 (L) 6.5 - 8.1 g/dL   Albumin 2.0 (L) 3.5 - 5.0 g/dL   AST 14 (L) 15 - 41 U/L   ALT 12 0 - 44 U/L   Alkaline Phosphatase 78 38 - 126 U/L   Total Bilirubin 0.3 0.3 - 1.2 mg/dL   GFR, Estimated >54 >09 mL/min    Comment: (NOTE) Calculated using the CKD-EPI Creatinine Equation (2021)  Anion gap 8 5 - 15    Comment: Performed at [facility], [address] [place], [place] [address]  Magnesium     Status: None    Collection Time: 08/17/21  4:22 AM  Result Value Ref Range   Magnesium 1.7 1.7 - 2.4 mg/dL    Comment: Performed at [facility], [address], [place], [place] [address]  Phosphorus     Status: None   Collection Time: 08/17/21  4:22 AM  Result Value Ref Range   Phosphorus 3.6 2.5 - 4.6 mg/dL    Comment: Performed at [facility], [address] [place], [place] [address]  Lactic acid, plasma     Status: Abnormal   Collection Time: 08/17/21  4:22 AM  Result Value Ref Range   Lactic Acid, Venous 2.1 (HH) 0.5 - 1.9 mmol/L    Comment: CRITICAL VALUE NOTED.  VALUE IS CONSISTENT WITH PREVIOUSLY REPORTED AND CALLED VALUE. Performed at [facility], [address] [place], [place] [address]   TSH     Status: None   Collection Time: 08/17/21  4:22 AM  Result Value Ref Range   TSH 3.003 0.350 - 4.500 uIU/mL    Comment: Performed by a 3rd Generation assay with a functional sensitivity of <=0.01 uIU/mL. Performed at [facility], [address], [place], [place] [address]   T4, free     Status: Abnormal   Collection Time: 08/17/21  4:22 AM  Result Value Ref Range   Free T4 0.59 (L) 0.61 - 1.12 ng/dL    Comment: (NOTE) Biotin ingestion may interfere with free T4 tests. If the results are inconsistent with the TSH level, previous test results, or the clinical presentation, then consider biotin interference. If needed, order repeat testing after stopping biotin. Performed at [facility], [address] [place], [place] [address]   Troponin I (High Sensitivity)     Status: Abnormal   Collection Time: 08/17/21  4:22 AM  Result Value Ref Range   Troponin I (High Sensitivity) 424 (HH) <18 ng/L    Comment: CRITICAL RESULT CALLED TO, READ BACK BY AND VERIFIED WITH: CINDY, RN @ 0544 ON 08/17/2021 BY LBROOKS, MLT (NOTE) Elevated high sensitivity troponin I (hsTnI) values and significant  changes across  serial measurements may suggest ACS but many other  chronic and acute conditions are known to elevate hsTnI results.  Refer to the Links section for chest pain algorithms and additional  guidance. Performed at [facility], [address] [place], [place] [address]   Troponin I (High Sensitivity)     Status: Abnormal   Collection Time: 08/17/21  6:41 AM  Result Value Ref Range   Troponin I (High Sensitivity) 390 (HH) <18 ng/L    Comment: DELTA CHECK NOTED CRITICAL VALUE NOTED.  VALUE IS CONSISTENT WITH PREVIOUSLY REPORTED AND CALLED VALUE. (NOTE) Elevated high sensitivity troponin I (hsTnI) values and significant  changes across serial measurements may suggest ACS but many other  chronic and acute conditions are known to elevate hsTnI results.  Refer to the Links section for chest pain algorithms and additional  guidance. Performed at [facility], [address], [place], [place] [address]   Glucose, capillary     Status: Abnormal   Collection Time: 08/17/21  7:56 AM  Result Value Ref Range   Glucose-Capillary 156 (H) 70 - 99 mg/dL    Comment: Glucose reference range applies only  to samples taken after fasting for at least 8 hours.   Comment 1 Notify RN    Comment 2 Document in Chart   Troponin I (High Sensitivity)     Status: Abnormal   Collection Time: 08/17/21  8:57 AM  Result Value Ref Range   Troponin I (High Sensitivity) 362 (HH) <18 ng/L    Comment: DELTA CHECK NOTED CRITICAL VALUE NOTED.  VALUE IS CONSISTENT WITH PREVIOUSLY REPORTED AND CALLED VALUE. (NOTE) Elevated high sensitivity troponin I (hsTnI) values and significant  changes across serial measurements may suggest ACS but many other  chronic and acute conditions are known to elevate hsTnI results.  Refer to the Links section for chest pain algorithms and additional  guidance. Performed at [facility], [address] [place], [place] [address]    Glucose, capillary     Status: Abnormal   Collection Time: 08/17/21 11:22 AM  Result Value Ref Range   Glucose-Capillary 155 (H) 70 - 99 mg/dL    Comment: Glucose reference range applies only to samples taken after fasting for at least 8 hours.   Comment 1 Notify RN    Comment 2 Document in Chart   Glucose, capillary     Status: Abnormal   Collection Time: 08/17/21  3:42 PM  Result Value Ref Range   Glucose-Capillary 162 (H) 70 - 99 mg/dL    Comment: Glucose reference range applies only to samples taken after fasting for at least 8 hours.   Comment 1 Notify RN    Comment 2 Document in Chart   Glucose, capillary     Status: Abnormal   Collection Time: 08/17/21  7:56 PM  Result Value Ref Range   Glucose-Capillary 157 (H) 70 - 99 mg/dL    Comment: Glucose reference range applies only to samples taken after fasting for at least 8 hours.  Glucose, capillary     Status: Abnormal   Collection Time: 08/17/21 11:14 PM  Result Value Ref Range   Glucose-Capillary 132 (H) 70 - 99 mg/dL    Comment: Glucose reference range applies only to samples taken after fasting for at least 8 hours.  Glucose, capillary     Status: Abnormal   Collection Time: 08/18/21  4:28 AM  Result Value Ref Range   Glucose-Capillary 125 (H) 70 - 99 mg/dL    Comment: Glucose reference range applies only to samples taken after fasting for at least 8 hours.  CBC with Differential/Platelet     Status: Abnormal   Collection Time: 08/18/21  4:35 AM  Result Value Ref Range   WBC 15.7 (H) 4.0 - 10.5 K/uL   RBC 3.41 (L) 3.87 - 5.11 MIL/uL   Hemoglobin 8.6 (L) 12.0 - 15.0 g/dL   HCT 35.4 (L) 65.6 - 81.2 %   MCV 86.2 80.0 - 100.0 fL   MCH 25.2 (L) 26.0 - 34.0 pg   MCHC 29.3 (L) 30.0 - 36.0 g/dL   RDW 75.1 (H) 70.0 - 17.4 %   Platelets 651 (H) 150 - 400 K/uL   nRBC 0.0 0.0 - 0.2 %   Neutrophils Relative % 85 %   Neutro Abs 13.3 (H) 1.7 - 7.7 K/uL   Lymphocytes Relative 11 %   Lymphs Abs 1.7 0.7 - 4.0 K/uL   Monocytes  Relative 2 %   Monocytes Absolute 0.3 0.1 - 1.0 K/uL   Eosinophils Relative 0 %   Eosinophils Absolute 0.0 0.0 - 0.5 K/uL   Basophils Relative 0 %  Basophils Absolute 0.0 0.0 - 0.1 K/uL   Immature Granulocytes 2 %   Abs Immature Granulocytes 0.27 (H) 0.00 - 0.07 K/uL   Polychromasia PRESENT    Target Cells PRESENT     Comment: Performed at [facility], [address] [place], [place] [address]  Comprehensive metabolic panel     Status: Abnormal   Collection Time: 08/18/21  4:35 AM  Result Value Ref Range   Sodium 134 (L) 135 - 145 mmol/L   Potassium 4.0 3.5 - 5.1 mmol/L   Chloride 98 98 - 111 mmol/L   CO2 31 22 - 32 mmol/L   Glucose, Bld 136 (H) 70 - 99 mg/dL    Comment: Glucose reference range applies only to samples taken after fasting for at least 8 hours.   BUN 9 8 - 23 mg/dL   Creatinine, Ser 7.820.60 0.44 - 1.00 mg/dL   Calcium 8.2 (L) 8.9 - 10.3 mg/dL   Total Protein 5.8 (L) 6.5 - 8.1 g/dL   Albumin 2.0 (L) 3.5 - 5.0 g/dL   AST 11 (L) 15 - 41 U/L   ALT 10 0 - 44 U/L   Alkaline Phosphatase 63 38 - 126 U/L   Total Bilirubin 0.3 0.3 - 1.2 mg/dL   GFR, Estimated >95>60 >62>60 mL/min    Comment: (NOTE) Calculated using the CKD-EPI Creatinine Equation (2021)    Anion gap 5 5 - 15    Comment: Performed at [facility], [address] [place], [place] [address]  Magnesium     Status: None   Collection Time: 08/18/21  4:35 AM  Result Value Ref Range   Magnesium 2.3 1.7 - 2.4 mg/dL    Comment: Performed at [facility], [address] [place], [place] [address]   No results found.    Assessment/Plan R gluteal crease and proximal posterior thigh wound, stages ranging from 2-4 -no bone is palpable in this wound -it is overall clean except one area with some necrotic tissue -will plan to place santyl on this area and continue dressing changes as ordered by WOC, RN. -no evidence of infection, no plans for surgical  debridement warranted -d/w primary service -labs, imaging, vitals, chart and previous notes reviewed from this admission and previous admission. -we will sign off.  Please call with questions.   Straightforward Medical Decision Making  Letha CapeKelly E Ikenna Ohms, [facility] 08/18/2021, 7:08 AM Please see Amion for pager number during day hours 7:00am-4:30pm or 7:00am -11:30am on weekends

## 2021-08-18 NOTE — Progress Notes (Signed)
Initial Nutrition Assessment  DOCUMENTATION CODES:   Not applicable  INTERVENTION:  - will order Ensure Enlive BID, each supplement provides 350 kcal and 20 grams of protein - will order 1 packet Juven BID, each packet provides 95 calories, 2.5 grams of protein (collagen), and 9.8 grams of carbohydrate (3 grams sugar); also contains 7 grams of L-arginine and L-glutamine, 300 mg vitamin C, 15 mg vitamin E, 1.2 mcg vitamin B-12, 9.5 mg zinc, 200 mg calcium, and 1.5 g  Calcium Beta-hydroxy-Beta-methylbutyrate to support wound healing. - will follow-up 1/18 for day #2 Calorie Count results.    NUTRITION DIAGNOSIS:   Increased nutrient needs related to acute illness, wound healing as evidenced by estimated needs.  GOAL:   Patient will meet greater than or equal to 90% of their needs  MONITOR:   PO intake, Supplement acceptance, Labs, Weight trends  REASON FOR ASSESSMENT:   Consult Calorie Count  ASSESSMENT:   66 year old female with medical history of COPD on 2L O2. GERD, chronic lower extremity venous insufficiency and edema., gout, hypercholesterolemia. Patient presented to the ED due to wounds, weeping R leg wound.  Patient sitting up in bed finishing a late lunch at the time of RD visit. Patient reports having a very good appetite. Noted that she has no teeth. She denies having difficulty chewing anything and reports that she plans to focus on getting dentures after discharge.   Patient reports that she greatly enjoys Ensure supplements and that she would like to receive them TID; provided her with vanilla Ensure poured over ice.   She was last seen by a [place] RD on 03/03/21.  Weight yesterday was 185 lb and PTA the last recorded weight was on 03/06/21 when she weighed 198 lb. This indicates 13 lb weight loss (6.6% body weight) in the past 5 months; not significant for time frame.   Calorie Count results: 1/16- lunch + dinner provided 828 kcal and 48 grams protein 1/17-  breakfast + lunch provided 990 kcal and 52 grams protein   Labs reviewed; CBGs: 125, 127, 107 mg/dl, Na: 134 mmol/l, Ca: 8.2 mg/dl.  Medications reviewed; 100 mg solu-cortef BID, sliding scale novolog, 150 mcg oral synthroid/day, 40 mg oral protonix/day, 17 g miralax/day, 1 tablet senokot/day.    NUTRITION - FOCUSED PHYSICAL EXAM:  Completed; no muscle or fat depletions, moderate pitting edema to BLE.  Diet Order:   Diet Order             Diet Carb Modified Fluid consistency: Thin; Room service appropriate? Yes  Diet effective now                   EDUCATION NEEDS:   Education needs have been addressed  Skin:  Skin Assessment: Skin Integrity Issues: Skin Integrity Issues:: Stage III, Stage II, Diabetic Ulcer, Other (Comment) Stage II: bilateral buttocks Stage III: R thigh Diabetic Ulcer: R heel Other: venous stasis ulcers to bilateral heels  Last BM:  1/14  Height:   Ht Readings from Last 1 Encounters:  08/15/21 5\' 4"  (1.626 m)    Weight:   Wt Readings from Last 1 Encounters:  08/17/21 83.8 kg     Estimated Nutritional Needs:  Kcal:  2150-2350 kcal Protein:  110-125 grams Fluid:  >/= 2.3 L/day     Jarome Matin, MS, RD, LDN Inpatient Clinical Dietitian RD pager # available in [place]  After hours/weekend pager # available in [facility]

## 2021-08-18 NOTE — Progress Notes (Signed)
Pharmacy Antibiotic Note  [name] is a 66 y.o. female admitted on 08/14/2021 with weeping wound on the right leg.  Pharmacy has been consulted for vancomycin and cefepime dosing. 08/18/2021 D#4 abx.  WBC up to 15.7 - getting solu-cortef & florinef PCT < 0.1 1/15 & 1/16 SCr 0.6> remains WNL on vanc/zosyn combo Afebrile On levo at 2 mcg/min  No surgical debridement needed per [facility]  Plan: Continue Vancomycin 1250 mg q24h (AUC 524.3, used Scr 0.8) Continue cefepime 2gm IV q8h Continue daily SCr while on vanc/zosyn combo Follow renal function and clinical course  Height: 5\' 4"  (162.6 cm) Weight: 83.8 kg (184 lb 11.9 oz) IBW/kg (Calculated) : 54.7  Temp (24hrs), Avg:98.1 F (36.7 C), Min:97.9 F (36.6 C), Max:98.2 F (36.8 C)  Recent Labs  Lab 08/15/21 0006 08/15/21 0027 08/15/21 0315 08/15/21 0742 08/16/21 0500 08/16/21 1034 08/17/21 0110 08/17/21 0422 08/18/21 0435  WBC 15.5*  --   --  13.1* 11.0*  --  14.2*  --  15.7*  CREATININE 0.90  --   --  0.70 0.67  --   --  0.76 0.60  LATICACIDVEN  --  1.8 2.6*  --   --  2.0*  --  2.1*  --      Estimated Creatinine Clearance: 74.4 mL/min (by C-G formula based on SCr of 0.6 mg/dL).    Allergies  Allergen Reactions   Gemfibrozil     Other reaction(s): Sick to stomach   Nitroglycerin     Other reaction(s): Cough, Irritation   Other Other (See Comments)    Grape fruit juice - unknown   Pravastatin     Other reaction(s): Dizzy   Sulfamethoxazole-Trimethoprim     Other reaction(s): N/V   Tyloxapol     Other reaction(s): Does not work    Antimicrobials this admission: 1/14 vanc >> 1/14 cefepime >> Dose adjustments this admission:   Microbiology results: 1/14 BCx: ngtd 1/14 MRSA +   Thank you for allowing pharmacy to be a part of this patients care.  2/14, Pharm.D [phone] 08/18/2021 1:39 PM

## 2021-08-18 NOTE — Progress Notes (Signed)
NAME:  [name], MRN:  [id number], DOB:  16-Aug-1955, LOS: 3 ADMISSION DATE:  08/14/2021, CONSULTATION DATE: August 15, 2021 REFERRING MD: ED, CHIEF COMPLAINT: Leg pain and swelling  BRIEF  [name] with past medical history of COPD on 2 L of oxygen patient is with GERD chronic lower extremity venous insufficiency edema wound was admitted to the hospital 2 months ago with what seems to be chronic ulcer she was supposed to follow-up with wound care and go to rehab but she ended up going to her home never followed up in the wound In the ED arrest patient was found also to have bedbugs She was given 3 L of fluid continue to be hypotensive required low-dose Levophed at 6 mics per minute. Patient had no fever chills rigors no nausea vomiting diarrhea no chest pain no abdominal pain she has significant pain in the back of her right thigh with a swelling of her right lower extremity   PMH  COPD HLD Gout   EVENTS   08/15/2021 -  repeat rounds: History review shows admission 06/16/2021 - 06/23/2021 with history of COPD on 2 L, depression on Wellbutrin and venlafaxine and anxiety on alprazolam, acid reflux hypothyroidism chronic lower extremity wounds [follows with Dr. Duda] hyperlipidemia, failure to thrive self-neglect with evidence of home infestation by bedbugs and fleas in November 2022, limited mobility using walker, that admission was for sepsis secondary to bilateral lower extremity cellulitis.  And stage II sacral decub.  She consistently refused dressing changes.  Patient discharged to [facility] to recent [place] home for antibiotics and dressing changes but subsequently went back to her house which according to the patient because she was ambulatory.  However according to the cousin patient has been with a cousin all along and has refused to participate in care and also has been having failure to thrive with diminished eating.  The cousin is asking for placement.  The  cousin is supportive of her DNR and DNI  -1/15 Levophed 9 mcg with good blood pressure.  She is awake and interactive.  She has significant pain in her lower extremities.  RN describes significant right thigh posterior ulcer and right foot ulcer and sacral decub all present on admission. 1/16- WOC consult, GS consult at their recommendation-- no surgical intervention needed.   SUBJECTIVE/OVERNIGHT/INTERVAL HX  Today she still has some pain in her leg. Started on O2 overnight when she was being rolled in bed.  Objective   Blood pressure (!) 101/54, pulse 72, temperature 97.9 F (36.6 C), temperature source Oral, resp. rate 12, height 5\' 4"  (1.626 m), weight 83.8 kg, SpO2 94 %.        Intake/Output Summary (Last 24 hours) at 08/18/2021 0729 Last data filed at 08/18/2021 0700 Gross per 24 hour  Intake 869.1 ml  Output 2000 ml  Net -1130.9 ml    Filed Weights   08/14/21 2156 08/15/21 0002 08/17/21 0430  Weight: 37.2 kg 86.2 kg 83.8 kg    General Appearance:  frail, chronically ill appearing woman lying in bed in NAD HEENT: [place]/AT, eyes anicteric, edentulous Neck: Supple Lungs: Breathing comfortably on 2 L nasal cannula, CTA B, no tachypnea or accessory muscle use. Heart: S1-S2, regular rate and rhythm Abdomen: Soft, nontender, nondistended Extremities: Bilateral lower extremity edema, erythema on distal extremities.  Decubitus ulcers not examined Skin: Warm, dry, erythema warmth distal right leg neurologic: Awake and alert, answering questions appropriately.  Slightly delayed in answering questions, not different than previous  exams.  Moving all extremities spontaneously, but globally weak.  LA 2.1, downtrending WBC 15.7 H/H 8.6/29.4 Platelets 651 Blood cultures> NGTD  Assessment & Plan:   Septic shock due to cellulitis RLE - Continue Zosyn and vancomycin - Continue norepinephrine to maintain MAP greater than 65.  Continue midodrine.   - Continue stress dose  steroids-fludrocortisone and hydrocortisone  COPD not acutely exacerbated. Not currently requiring oxygen and doesn't use at home despite previous prescription for home oxygen. - Continue DuoNebs twice daily - Recommend outpatient PFTs.  Can start LAMA before discharge.  Decubitus ulcers-- R foot, R thigh, sacrum -WOC consult.  Appreciate surgery's input.  No recommended surgical intervention.  Anxiety Chronic opiate use for chronic LE pain --Continue PTA gabapentin, Wellbutrin, Effexor, Xanax as needed  Mild hyponatremia  -Continue to monitor -avoid excessive free water  GERD -Continue PPI that is a PTA medication  Anemia of chronic disease- acute on chronic  -Transfuse for hemoglobin less than 7 or hemodynamically significant bleeding - Monitor  Reactive thrombocytosis due to sepsis - Monitor   Prediabetes, hyperglycemia - Continue sliding scale insulin as needed - Goal BG 140-180  Troponin elevation, likely 2/2 sepsis. Echo with normal EF and no RWMA, so unlikely ischemic disease -no additional monitoring  Hypothyroidism - Continue PTA Synthroid  Failure to thrive   Self-neglect with poor motivation Immobility using walker  -PT, OT, OOB mobility  Severe protein calorie malnutrition  cachexia - Encourage adequate p.o. intake.  Appreciate RD's input  Poor social situation with bedbugs and fleas in the house  Noncompliance with medical instructions -Need social work assistance with placement   Best practice:  Diet: Carb modified Pain/Anxiety/Delirium protocol (if indicated): VAP protocol (if indicated):  DVT prophylaxis: Lovenox GI prophylaxis: PPI Glucose control: SSI Mobility: ambulate with assistance   Code Status: DNR (08/15/2021 at admission: Cousin is in favor of DNR.  Also spoke independently to the patient.  Explained to the patient that we are trying to get her better but in the process if her heart stops or her breathing stops then natural death  has occurred.  Expressed to the patient that in the event this was to happen would be recognized this is natural death or try to intervene in terms of intubation or CPR.  Expressed to the patient that given her medical conditions probably not in her best interest to go through CPR and intubation.  Patient states she was aligned with this and confirmed DNR/DNI.  Communicated this with nurse Ebony Hail ). Family Communication: Called [name] relative [phone] -> cousin reports that she is very difficult and challenging to take care of.   Cousin is requesting placement in a nursing home.  Cousin is in favor of DNR  Disposition: ICU     LABS    PULMONARY Recent Labs  Lab 08/15/21 1423  PHART 7.436  PCO2ART 37.1  PO2ART 79.4*  HCO3 24.5  O2SAT 95.6     CBC Recent Labs  Lab 08/16/21 0500 08/17/21 0110 08/18/21 0435  HGB 9.4* 9.1* 8.6*  HCT 31.7* 31.1* 29.4*  WBC 11.0* 14.2* 15.7*  PLT 791* 800* 651*     COAGULATION No results for input(s): INR in the last 168 hours.  CARDIAC  No results for input(s): TROPONINI in the last 168 hours. No results for input(s): PROBNP in the last 168 hours.   CHEMISTRY Recent Labs  Lab 08/15/21 0006 08/15/21 0742 08/15/21 1234 08/16/21 0500 08/17/21 0422 08/18/21 0435  NA 128*  --   --  134* 134* 134*  K 4.0  --   --  3.4* 4.6 4.0  CL 93*  --   --  102 100 98  CO2 22  --   --  26 26 31   GLUCOSE 86  --   --  145* 184* 136*  BUN 17  --   --  9 8 9   CREATININE 0.90 0.70  --  0.67 0.76 0.60  CALCIUM 8.9  --   --  8.2* 8.6* 8.2*  MG  --   --  2.4  --  1.7 2.3  PHOS  --   --   --  3.8 3.6  --     Estimated Creatinine Clearance: 74.4 mL/min (by C-G formula based on SCr of 0.6 mg/dL).   This patient is critically ill with multiple organ system failure which requires frequent high complexity decision making, assessment, support, evaluation, and titration of therapies. This was completed through the application of advanced  monitoring technologies and extensive interpretation of multiple databases. During this encounter critical care time was devoted to patient care services described in this note for 34 minutes.  Julian Hy, DO 08/18/21 9:25 AM [facility]

## 2021-08-19 DIAGNOSIS — L8940 Pressure ulcer of contiguous site of back, buttock and hip, unspecified stage: Secondary | ICD-10-CM

## 2021-08-19 LAB — COMPREHENSIVE METABOLIC PANEL
ALT: 12 U/L (ref 0–44)
AST: 14 U/L — ABNORMAL LOW (ref 15–41)
Albumin: 2.2 g/dL — ABNORMAL LOW (ref 3.5–5.0)
Alkaline Phosphatase: 64 U/L (ref 38–126)
Anion gap: 5 (ref 5–15)
BUN: 14 mg/dL (ref 8–23)
CO2: 32 mmol/L (ref 22–32)
Calcium: 8.4 mg/dL — ABNORMAL LOW (ref 8.9–10.3)
Chloride: 96 mmol/L — ABNORMAL LOW (ref 98–111)
Creatinine, Ser: 0.83 mg/dL (ref 0.44–1.00)
GFR, Estimated: >60 mL/min (ref >60)
Glucose, Bld: 173 mg/dL — ABNORMAL HIGH (ref 70–99)
Potassium: 4.3 mmol/L (ref 3.5–5.1)
Sodium: 133 mmol/L — ABNORMAL LOW (ref 135–145)
Total Bilirubin: 0.4 mg/dL (ref 0.3–1.2)
Total Protein: 6 g/dL — ABNORMAL LOW (ref 6.5–8.1)

## 2021-08-19 LAB — BASIC METABOLIC PANEL
Anion gap: 8 (ref 5–15)
BUN: 18 mg/dL (ref 8–23)
CO2: 32 mmol/L (ref 22–32)
Calcium: 8.4 mg/dL — ABNORMAL LOW (ref 8.9–10.3)
Chloride: 95 mmol/L — ABNORMAL LOW (ref 98–111)
Creatinine, Ser: 0.83 mg/dL (ref 0.44–1.00)
GFR, Estimated: >60 mL/min (ref >60)
Glucose, Bld: 167 mg/dL — ABNORMAL HIGH (ref 70–99)
Potassium: 4.4 mmol/L (ref 3.5–5.1)
Sodium: 135 mmol/L (ref 135–145)

## 2021-08-19 LAB — CBC WITH DIFFERENTIAL/PLATELET
Abs Immature Granulocytes: 0.32 10*3/uL — ABNORMAL HIGH (ref 0.00–0.07)
Abs Immature Granulocytes: 0.31 10*3/uL — ABNORMAL HIGH (ref 0.00–0.07)
Basophils Absolute: 0 10*3/uL (ref 0.0–0.1)
Basophils Absolute: 0 10*3/uL (ref 0.0–0.1)
Basophils Relative: 0 %
Basophils Relative: 0 %
Eosinophils Absolute: 0 10*3/uL (ref 0.0–0.5)
Eosinophils Absolute: 0 10*3/uL (ref 0.0–0.5)
Eosinophils Relative: 0 %
Eosinophils Relative: 0 %
HCT: 30.6 % — ABNORMAL LOW (ref 36.0–46.0)
HCT: 31.3 % — ABNORMAL LOW (ref 36.0–46.0)
Hemoglobin: 8.9 g/dL — ABNORMAL LOW (ref 12.0–15.0)
Hemoglobin: 9 g/dL — ABNORMAL LOW (ref 12.0–15.0)
Immature Granulocytes: 2 %
Immature Granulocytes: 2 %
Lymphocytes Relative: 14 %
Lymphocytes Relative: 13 %
Lymphs Abs: 1.9 10*3/uL (ref 0.7–4.0)
Lymphs Abs: 1.8 10*3/uL (ref 0.7–4.0)
MCH: 25.1 pg — ABNORMAL LOW (ref 26.0–34.0)
MCH: 25.5 pg — ABNORMAL LOW (ref 26.0–34.0)
MCHC: 29.1 g/dL — ABNORMAL LOW (ref 30.0–36.0)
MCHC: 28.8 g/dL — ABNORMAL LOW (ref 30.0–36.0)
MCV: 86.4 fL (ref 80.0–100.0)
MCV: 88.7 fL (ref 80.0–100.0)
Monocytes Absolute: 0.3 10*3/uL (ref 0.1–1.0)
Monocytes Absolute: 0.5 10*3/uL (ref 0.1–1.0)
Monocytes Relative: 2 %
Monocytes Relative: 4 %
Neutro Abs: 11.3 10*3/uL — ABNORMAL HIGH (ref 1.7–7.7)
Neutro Abs: 11.7 10*3/uL — ABNORMAL HIGH (ref 1.7–7.7)
Neutrophils Relative %: 82 %
Neutrophils Relative %: 81 %
Platelets: 684 10*3/uL — ABNORMAL HIGH (ref 150–400)
Platelets: 781 10*3/uL — ABNORMAL HIGH (ref 150–400)
RBC: 3.54 MIL/uL — ABNORMAL LOW (ref 3.87–5.11)
RBC: 3.53 MIL/uL — ABNORMAL LOW (ref 3.87–5.11)
RDW: 21.9 % — ABNORMAL HIGH (ref 11.5–15.5)
RDW: 22.3 % — ABNORMAL HIGH (ref 11.5–15.5)
WBC: 13.9 10*3/uL — ABNORMAL HIGH (ref 4.0–10.5)
WBC: 14.3 10*3/uL — ABNORMAL HIGH (ref 4.0–10.5)
nRBC: 0.3 % — ABNORMAL HIGH (ref 0.0–0.2)
nRBC: 0.3 % — ABNORMAL HIGH (ref 0.0–0.2)

## 2021-08-19 LAB — MAGNESIUM: Magnesium: 2 mg/dL (ref 1.7–2.4)

## 2021-08-19 LAB — GLUCOSE, CAPILLARY
Glucose-Capillary: 136 mg/dL — ABNORMAL HIGH (ref 70–99)
Glucose-Capillary: 148 mg/dL — ABNORMAL HIGH (ref 70–99)
Glucose-Capillary: 158 mg/dL — ABNORMAL HIGH (ref 70–99)
Glucose-Capillary: 162 mg/dL — ABNORMAL HIGH (ref 70–99)
Glucose-Capillary: 173 mg/dL — ABNORMAL HIGH (ref 70–99)
Glucose-Capillary: 208 mg/dL — ABNORMAL HIGH (ref 70–99)

## 2021-08-19 LAB — PHOSPHORUS: Phosphorus: 3.1 mg/dL (ref 2.5–4.6)

## 2021-08-19 MED ORDER — PROCHLORPERAZINE EDISYLATE 10 MG/2ML IJ SOLN
10.0000 mg | Freq: Four times a day (QID) | INTRAMUSCULAR | Status: DC | PRN
  Administered 2021-08-20 – 2021-08-26 (×2): 10 mg via INTRAVENOUS
  Filled 2021-08-19 (×2): qty 2

## 2021-08-19 MED ORDER — FENTANYL CITRATE (PF) 100 MCG/2ML IJ SOLN
50.0000 ug | Freq: Two times a day (BID) | INTRAMUSCULAR | Status: DC | PRN
  Administered 2021-08-19 – 2021-08-24 (×10): 100 ug via INTRAVENOUS
  Administered 2021-08-26 – 2021-08-29 (×3): 50 ug via INTRAVENOUS
  Administered 2021-08-30 (×2): 100 ug via INTRAVENOUS
  Administered 2021-08-31: 50 ug via INTRAVENOUS
  Administered 2021-08-31 – 2021-09-01 (×3): 100 ug via INTRAVENOUS
  Filled 2021-08-19 (×19): qty 2

## 2021-08-19 MED ORDER — MIDODRINE HCL 5 MG PO TABS
15.0000 mg | ORAL_TABLET | Freq: Three times a day (TID) | ORAL | Status: DC
  Administered 2021-08-19 – 2021-09-04 (×49): 15 mg via ORAL
  Filled 2021-08-19 (×52): qty 3

## 2021-08-19 MED ORDER — OXYCODONE HCL 5 MG PO TABS
10.0000 mg | ORAL_TABLET | ORAL | Status: DC | PRN
  Administered 2021-08-20: 12:00:00 10 mg via ORAL
  Administered 2021-08-20: 20:00:00 15 mg via ORAL
  Administered 2021-08-20: 10 mg via ORAL
  Administered 2021-08-21 – 2021-08-23 (×9): 15 mg via ORAL
  Administered 2021-08-24: 10 mg via ORAL
  Administered 2021-08-24 – 2021-08-25 (×4): 15 mg via ORAL
  Administered 2021-08-25: 10 mg via ORAL
  Administered 2021-08-25: 19:00:00 15 mg via ORAL
  Administered 2021-08-25 – 2021-08-26 (×4): 10 mg via ORAL
  Administered 2021-08-26 – 2021-08-27 (×4): 15 mg via ORAL
  Administered 2021-08-27 – 2021-08-28 (×2): 10 mg via ORAL
  Administered 2021-08-28 – 2021-09-01 (×14): 15 mg via ORAL
  Administered 2021-09-01 – 2021-09-03 (×5): 10 mg via ORAL
  Administered 2021-09-03 (×2): 15 mg via ORAL
  Administered 2021-09-03: 10 mg via ORAL
  Administered 2021-09-03 – 2021-09-04 (×5): 15 mg via ORAL
  Filled 2021-08-19 (×5): qty 3
  Filled 2021-08-19: qty 2
  Filled 2021-08-19 (×2): qty 3
  Filled 2021-08-19 (×2): qty 2
  Filled 2021-08-19 (×3): qty 3
  Filled 2021-08-19: qty 2
  Filled 2021-08-19 (×7): qty 3
  Filled 2021-08-19: qty 2
  Filled 2021-08-19 (×6): qty 3
  Filled 2021-08-19: qty 2
  Filled 2021-08-19 (×2): qty 3
  Filled 2021-08-19: qty 2
  Filled 2021-08-19 (×4): qty 3
  Filled 2021-08-19: qty 2
  Filled 2021-08-19: qty 3
  Filled 2021-08-19: qty 2
  Filled 2021-08-19: qty 3
  Filled 2021-08-19: qty 2
  Filled 2021-08-19: qty 3
  Filled 2021-08-19: qty 2
  Filled 2021-08-19 (×2): qty 3
  Filled 2021-08-19: qty 2
  Filled 2021-08-19 (×2): qty 3
  Filled 2021-08-19 (×2): qty 2
  Filled 2021-08-19 (×7): qty 3

## 2021-08-19 MED ORDER — ONDANSETRON HCL 4 MG/2ML IJ SOLN
4.0000 mg | Freq: Four times a day (QID) | INTRAMUSCULAR | Status: DC
  Administered 2021-08-19 – 2021-08-27 (×29): 4 mg via INTRAVENOUS
  Filled 2021-08-19 (×28): qty 2

## 2021-08-19 MED ORDER — INSULIN DETEMIR 100 UNIT/ML ~~LOC~~ SOLN
5.0000 [IU] | Freq: Every day | SUBCUTANEOUS | Status: DC
  Administered 2021-08-19 – 2021-08-21 (×3): 5 [IU] via SUBCUTANEOUS
  Filled 2021-08-19 (×4): qty 0.05

## 2021-08-19 MED ORDER — PROCHLORPERAZINE EDISYLATE 10 MG/2ML IJ SOLN: 10.0000 mg | Freq: Four times a day (QID) | INTRAMUSCULAR | Status: DC | PRN

## 2021-08-19 NOTE — Progress Notes (Signed)
Ordered compazine PRN as second line agent for persistent nausea and vomiting.

## 2021-08-19 NOTE — TOC Progression Note (Signed)
Transition of Care [facility]) - Progression Note    Patient Details  Name: [name] MRN: [id number] Date of Birth: [birth year]  Transition of [facility] CM/SW Contact  Choua Chalker, Olegario Messier, RN Phone Number: 08/19/2021, 9:17 AM  Clinical Narrative: spke to patient about ST SNF-will f/u later today. Has home 02.      Expected Discharge Plan:  (TBD) Barriers to Discharge: Continued Medical Work up  Expected Discharge Plan and Services Expected Discharge Plan:  (TBD)   Discharge Planning Services: CM Consult   Living arrangements for the past 2 months: Single Family Home                                       Social Determinants of Health (SDOH) Interventions    Readmission Risk Interventions No flowsheet data found.

## 2021-08-19 NOTE — Progress Notes (Signed)
CALORIE COUNT NOTE  No meal tickets in Calorie Count envelope from dinner yesterday or breakfast today.   Patient is currently sitting up in the chair eating lunch. No visitors at bedside.   Patient discussed in rounds this AM. RN confirms that patient greatly enjoys Ensure and has consistently been consuming this supplement.  Will discontinue Calorie Count at this time and RD will continue to follow per protocol.     Trenton Gammon, MS, RD, LDN Inpatient Clinical Dietitian RD pager # available in [facility]  After hours/weekend pager # available in [facility]

## 2021-08-19 NOTE — Progress Notes (Signed)
NAME:  [name], MRN:  [id number], DOB:  1955/12/25, LOS: 4 ADMISSION DATE:  08/14/2021, CONSULTATION DATE: August 15, 2021 REFERRING MD: ED, CHIEF COMPLAINT: Leg pain and swelling  BRIEF  [name] with past medical history of COPD on 2 L of oxygen patient is with GERD chronic lower extremity venous insufficiency edema wound was admitted to the hospital 2 months ago with what seems to be chronic ulcer she was supposed to follow-up with wound care and go to rehab but she ended up going to her home never followed up in the wound In the ED arrest patient was found also to have bedbugs She was given 3 L of fluid continue to be hypotensive required low-dose Levophed at 6 mics per minute. Patient had no fever chills rigors no nausea vomiting diarrhea no chest pain no abdominal pain she has significant pain in the back of her right thigh with a swelling of her right lower extremity   PMH  COPD HLD Gout  EVENTS   08/15/2021 -  repeat rounds: History review shows admission 06/16/2021 - 06/23/2021 with history of COPD on 2 L, depression on Wellbutrin and venlafaxine and anxiety on alprazolam, acid reflux hypothyroidism chronic lower extremity wounds [follows with Dr. Duda] hyperlipidemia, failure to thrive self-neglect with evidence of home infestation by bedbugs and fleas in November 2022, limited mobility using walker, that admission was for sepsis secondary to bilateral lower extremity cellulitis.  And stage II sacral decub.  She consistently refused dressing changes.  Patient discharged to [facility] to recent [place] home for antibiotics and dressing changes but subsequently went back to her house which according to the patient because she was ambulatory.  However according to the cousin patient has been with a cousin all along and has refused to participate in care and also has been having failure to thrive with diminished eating.  The cousin is asking for placement.  The  cousin is supportive of her DNR and DNI  -1/15 Levophed 9 mcg with good blood pressure.  She is awake and interactive.  She has significant pain in her lower extremities.  RN describes significant right thigh posterior ulcer and right foot ulcer and sacral decub all present on admission. 1/16- WOC consult, GS consult at their recommendation-- no surgical intervention needed.   SUBJECTIVE/OVERNIGHT/INTERVAL HX  Not getting adequate pain control with oxycodone, especially with dressing changes. She denies complaints otherwise. Eating well and doing well with ensure supplements.   Objective   Blood pressure 95/69, pulse 80, temperature 97.8 F (36.6 C), temperature source Oral, resp. rate 18, height 5\' 4"  (1.626 m), weight 83.8 kg, SpO2 99 %.        Intake/Output Summary (Last 24 hours) at 08/19/2021 1332 Last data filed at 08/19/2021 1207 Gross per 24 hour  Intake 617.5 ml  Output 1000 ml  Net -382.5 ml    Filed Weights   08/14/21 2156 08/15/21 0002 08/17/21 0430  Weight: 37.2 kg 86.2 kg 83.8 kg    General Appearance:  chronically ill appearing woman lying in bed in NAD HEENT: [place]/AT, eyes anicteric Neck: supple  Lungs: breathing comfortably on RA Heart: S1S2, RRR Abdomen: soft, NT Extremities: BLE edema, chronic stasis dermatitis Skin: warm, dry, chronic stasis dermatitis, erythema R distal leg. Dependent wounds not examined.  neurologic: awake, moving all extremities, globally weak. Answering questions slowly but appropriately, similar to previous exams.  NA+  133 WBC 13.9 H/H 8.9/30.6 Platelets 684 Blood cultures> NGTD  Assessment &  Plan:   Septic shock due to cellulitis RLE - Continue Vanco and Zosyn.  Anticipate a prolonged antibiotics course. - Weaning norepinephrine to maintain SBP greater than 90.  Increase midodrine to 15 mg 3 times daily - Continue stress dose steroids-hydrocortisone and fludrocortisone.  COPD not acutely exacerbated. Not currently requiring  oxygen and doesn't use at home despite previous prescription for home oxygen. - DuoNebs twice daily.  Can start LAMA before discharge. - Recommend outpatient PFTs.  Decubitus ulcers-- R foot, R thigh, sacrum -WOC consult. BID dressing changes. Appreciate surgery's evaluation and recommendations.  Anxiety Chronic opiate use for chronic LE pain --Continue PTA Wellbutrin, Effexor, gabapentin. Xanax as needed  Mild hyponatremia, persistent -Continue to monitor - Avoid excessive free water  GERD -Continue PPI.  Was on PPI PTA-continue to monitor  Anemia of chronic disease- acute on chronic  - Transfuse for hemoglobin less than 7 or hemodynamically significant bleeding  Reactive thrombocytosis due to sepsis - Monitor - DVT prophylaxis   Prediabetes, hyperglycemia - Continue sliding scale insulin - Adding on 5 units of Levemir daily - Goal BG 100 4180 -Need to decrease insulin when coming off steroids  Troponin elevation, likely 2/2 sepsis. Echo with normal EF and no RWMA, so unlikely ischemic disease -no additional monitoring  Hypothyroidism - Continue Synthroid  Failure to thrive   Self-neglect with poor motivation Immobility using walker  -PT, OT, out of bed mobility as able to tolerate - Doing well with Ensure supplements and eating well while in the hospital.  She is encouraged to continue this.  Severe protein calorie malnutrition  cachexia -Encourage adequate p.o. intake.  Appreciate RD's input.  Poor social situation with bedbugs and fleas in the house  Noncompliance with medical instructions -Need social work assistance with placement  Patient updated at bedside.  Best practice:  Diet: Carb modified Pain/Anxiety/Delirium protocol (if indicated): VAP protocol (if indicated):  DVT prophylaxis: Lovenox GI prophylaxis: PPI Glucose control: SSI, levemir Mobility: ambulate with assistance   Code Status: DNR (08/15/2021 at admission: Cousin is in favor of DNR.   Also spoke independently to the patient.  Explained to the patient that we are trying to get her better but in the process if her heart stops or her breathing stops then natural death has occurred.  Expressed to the patient that in the event this was to happen would be recognized this is natural death or try to intervene in terms of intubation or CPR.  Expressed to the patient that given her medical conditions probably not in her best interest to go through CPR and intubation.  Patient states she was aligned with this and confirmed DNR/DNI.  Communicated this with nurse Ebony Hail ). Family Communication: Called [name] relative [phone] -> cousin reports that she is very difficult and challenging to take care of.   Cousin is requesting placement in a nursing home.  Cousin is in favor of DNR  Disposition: ICU     LABS    PULMONARY Recent Labs  Lab 08/15/21 1423  PHART 7.436  PCO2ART 37.1  PO2ART 79.4*  HCO3 24.5  O2SAT 95.6     CBC Recent Labs  Lab 08/17/21 0110 08/18/21 0435 08/19/21 0522  HGB 9.1* 8.6* 8.9*  HCT 31.1* 29.4* 30.6*  WBC 14.2* 15.7* 13.9*  PLT 800* 651* 684*     COAGULATION No results for input(s): INR in the last 168 hours.  CARDIAC  No results for input(s): TROPONINI in the last 168 hours. No results for input(s):  PROBNP in the last 168 hours.   CHEMISTRY Recent Labs  Lab 08/15/21 0006 08/15/21 0742 08/15/21 1234 08/16/21 0500 08/17/21 0422 08/18/21 0435 08/19/21 0522  NA 128*  --   --  134* 134* 134* 133*  K 4.0  --   --  3.4* 4.6 4.0 4.3  CL 93*  --   --  102 100 98 96*  CO2 22  --   --  26 26 31  32  GLUCOSE 86  --   --  145* 184* 136* 173*  BUN 17  --   --  9 8 9 14   CREATININE 0.90 0.70  --  0.67 0.76 0.60 0.83  CALCIUM 8.9  --   --  8.2* 8.6* 8.2* 8.4*  MG  --   --  2.4  --  1.7 2.3 2.0  PHOS  --   --   --  3.8 3.6  --  3.1    Estimated Creatinine Clearance: 71.7 mL/min (by C-G formula based on SCr of 0.83 mg/dL).   This  patient is critically ill with multiple organ system failure which requires frequent high complexity decision making, assessment, support, evaluation, and titration of therapies. This was completed through the application of advanced monitoring technologies and extensive interpretation of multiple databases. During this encounter critical care time was devoted to patient care services described in this note for 35 minutes.  Julian Hy, DO 08/19/21 2:59 PM [facility]

## 2021-08-20 LAB — CULTURE, BLOOD (ROUTINE X 2)
Culture: NO GROWTH
Culture: NO GROWTH
Special Requests: ADEQUATE
Special Requests: ADEQUATE

## 2021-08-20 LAB — GLUCOSE, CAPILLARY
Glucose-Capillary: 139 mg/dL — ABNORMAL HIGH (ref 70–99)
Glucose-Capillary: 178 mg/dL — ABNORMAL HIGH (ref 70–99)
Glucose-Capillary: 160 mg/dL — ABNORMAL HIGH (ref 70–99)
Glucose-Capillary: 153 mg/dL — ABNORMAL HIGH (ref 70–99)
Glucose-Capillary: 188 mg/dL — ABNORMAL HIGH (ref 70–99)
Glucose-Capillary: 182 mg/dL — ABNORMAL HIGH (ref 70–99)

## 2021-08-20 LAB — CREATININE, SERUM
Creatinine, Ser: 0.71 mg/dL (ref 0.44–1.00)
GFR, Estimated: >60 mL/min (ref >60)

## 2021-08-20 MED ORDER — MAGIC MOUTHWASH W/LIDOCAINE
2.0000 mL | Freq: Three times a day (TID) | ORAL | Status: DC | PRN
  Administered 2021-08-21 – 2021-09-04 (×34): 2 mL via ORAL
  Filled 2021-08-20 (×38): qty 5

## 2021-08-20 MED ORDER — INFLUENZA VAC SPLIT QUAD 0.5 ML IM SUSY
0.5000 mL | PREFILLED_SYRINGE | INTRAMUSCULAR | Status: DC
  Filled 2021-08-20: qty 0.5

## 2021-08-20 NOTE — Progress Notes (Signed)
NAME:  [name], MRN:  [id number], DOB:  1955/10/19, LOS: 5 ADMISSION DATE:  08/14/2021, CONSULTATION DATE: August 15, 2021 REFERRING MD: ED, CHIEF COMPLAINT: Leg pain and swelling  BRIEF  [name] with past medical history of COPD on 2 L of oxygen patient is with GERD chronic lower extremity venous insufficiency edema wound was admitted to the hospital 2 months ago with what seems to be chronic ulcer she was supposed to follow-up with wound care and go to rehab but she ended up going to her home never followed up in the wound In the ED arrest patient was found also to have bedbugs She was given 3 L of fluid continue to be hypotensive required low-dose Levophed at 6 mics per minute. Patient had no fever chills rigors no nausea vomiting diarrhea no chest pain no abdominal pain she has significant pain in the back of her right thigh with a swelling of her right lower extremity   PMH  COPD HLD Gout  EVENTS   08/15/2021 -  repeat rounds: History review shows admission 06/16/2021 - 06/23/2021 with history of COPD on 2 L, depression on Wellbutrin and venlafaxine and anxiety on alprazolam, acid reflux hypothyroidism chronic lower extremity wounds [follows with Dr. Duda] hyperlipidemia, failure to thrive self-neglect with evidence of home infestation by bedbugs and fleas in November 2022, limited mobility using walker, that admission was for sepsis secondary to bilateral lower extremity cellulitis.  And stage II sacral decub.  She consistently refused dressing changes.  Patient discharged to [facility] to recent [place] [facility] for antibiotics and dressing changes but subsequently went back to her house which according to the patient because she was ambulatory.  However according to the cousin patient has been with a cousin all along and has refused to participate in care and also has been having failure to thrive with diminished eating.  The cousin is asking for placement.  The  cousin is supportive of her DNR and DNI  -1/15 Levophed 9 mcg with good blood pressure.  She is awake and interactive.  She has significant pain in her lower extremities.  RN describes significant right thigh posterior ulcer and right foot ulcer and sacral decub all present on admission. 1/16- WOC consult, GS consult at their recommendation-- no surgical intervention needed.   SUBJECTIVE/OVERNIGHT/INTERVAL HX  [name] denies complaints today.   Objective   Blood pressure (!) 113/38, pulse 75, temperature 97.7 F (36.5 C), temperature source Axillary, resp. rate 14, height 5\' 4"  (1.626 m), weight 87.6 kg, SpO2 97 %.        Intake/Output Summary (Last 24 hours) at 08/20/2021 0721 Last data filed at 08/20/2021 [id number] Gross per 24 hour  Intake 628.93 ml  Output 1650 ml  Net -1021.07 ml    Filed Weights   08/15/21 0002 08/17/21 0430 08/20/21 0355  Weight: 86.2 kg 83.8 kg 87.6 kg    General Appearance:  chronically ill appearing woman lying in bed in NAD HEENT: [place]/AT, eyes anicteric, edentulous, oral mucosa moist. Neck: supple  Lungs:  breathing comfortably on 2L [place] saturating in upper 90s Heart: S1S2, RRR Abdomen: soft, NT, ND Extremities: improving erythema RLE, RLE wounds wrapped. Skin: no diffuse rashes, warm, & dry neurologic: awake, alert, eating breakfast, moving all extremities  NA+  135 WBC 14.3 H/H 9.0/30.6 Platelets 684 Blood cultures> NGTD  Assessment & Plan:   Septic shock due to cellulitis RLE - Continue Vanco and Zosyn.  Anticipate a prolonged antibiotics course with  wounds. - Con't efforts at eaning norepinephrine to maintain SBP greater than 90.   Still on . Con't midodrine 15 mg 3 times daily. - Continue stress dose steroids-hydrocortisone and fludrocortisone.  Acute respiratory failure with hypoxia COPD not acutely exacerbated. Not currently requiring oxygen and doesn't use at home despite previous prescription for home oxygen. - Con't Duonebs BID,  can start LAMA at discharge - Recommend outpatient PFTs. -until off pressors not diuresing -pulmonary hygiene, OOB mobility as able  Decubitus ulcers-- R foot, R thigh, sacrum -Appreciate surgery and WOC teams' management. Dressing changes.  -oxycodone for pain control, fentanyl PRN.  Anxiety Chronic opiate use for chronic LE pain -Con't PTA Wellbutrin, effexor, gabapentin, Xanax PRN   Mild hyponatremia, resolved -Avoid excessive free water, continue to monitor  GERD -Con't PPI; has been on as OP  Anemia of chronic disease- acute on chronic  - transfuse for Hb<7 or hemodynamically significant bleeding  Reactive thrombocytosis due to sepsis - con't to monitor -con't DVT prophylaxis   Prediabetes, hyperglycemia - SSI PRN -con't levemir 5 units daily -goal BG 140-180  -Need to decrease insulin when coming off steroids  Troponin elevation, likely 2/2 sepsis. Echo with normal EF and no RWMA, so unlikely ischemic disease -no additional monitoring  Hypothyroidism -Continue Synthroid  Failure to thrive   Self-neglect with poor motivation Immobility using walker  -PT, OT, out of bed mobility as able to tolerate - Doing well with Ensure supplements and eating well while in the hospital.  She is encouraged to continue this.  Severe protein calorie malnutrition  cachexia -Encourage adequate p.o. intake.  Appreciate RD's input.  Poor social situation with bedbugs and fleas in the house  Noncompliance with medical instructions -Need social work assistance with placement  Patient updated at bedside.   Best practice:  Diet: Carb modified Pain/Anxiety/Delirium protocol (if indicated): VAP protocol (if indicated):  DVT prophylaxis: Lovenox GI prophylaxis: PPI Glucose control: SSI, levemir Mobility: ambulate with assistance   Code Status: DNR (08/15/2021 at admission: Cousin is in favor of DNR.  Also spoke independently to the patient.  Explained to the patient that we  are trying to get her better but in the process if her heart stops or her breathing stops then natural death has occurred.  Expressed to the patient that in the event this was to happen would be recognized this is natural death or try to intervene in terms of intubation or CPR.  Expressed to the patient that given her medical conditions probably not in her best interest to go through CPR and intubation.  Patient states she was aligned with this and confirmed DNR/DNI.  Communicated this with nurse Revonda Standard ). Family Communication: Called Sebastian Ache relative [phone] -> cousin reports that she is very difficult and challenging to take care of.   Cousin is requesting placement in a nursing home.  Cousin is in favor of DNR  Disposition: ICU     LABS    PULMONARY Recent Labs  Lab 08/15/21 1423  PHART 7.436  PCO2ART 37.1  PO2ART 79.4*  HCO3 24.5  O2SAT 95.6     CBC Recent Labs  Lab 08/18/21 0435 08/19/21 0522 08/19/21 2015  HGB 8.6* 8.9* 9.0*  HCT 29.4* 30.6* 31.3*  WBC 15.7* 13.9* 14.3*  PLT 651* 684* 781*     COAGULATION No results for input(s): INR in the last 168 hours.  CARDIAC  No results for input(s): TROPONINI in the last 168 hours. No results for input(s): PROBNP in the  last 168 hours.   CHEMISTRY Recent Labs  Lab 08/15/21 1234 08/16/21 0500 08/17/21 0422 08/18/21 0435 08/19/21 0522 08/19/21 2015 08/20/21 0350  NA  --  134* 134* 134* 133* 135  --   K  --  3.4* 4.6 4.0 4.3 4.4  --   CL  --  102 100 98 96* 95*  --   CO2  --  26 26 31  32 32  --   GLUCOSE  --  145* 184* 136* 173* 167*  --   BUN  --  9 8 9 14 18   --   CREATININE  --  0.67 0.76 0.60 0.83 0.83 0.71  CALCIUM  --  8.2* 8.6* 8.2* 8.4* 8.4*  --   MG 2.4  --  1.7 2.3 2.0  --   --   PHOS  --  3.8 3.6  --  3.1  --   --     Estimated Creatinine Clearance: 76.2 mL/min (by C-G formula based on SCr of 0.71 mg/dL).   This patient is critically ill with multiple organ system failure which  requires frequent high complexity decision making, assessment, support, evaluation, and titration of therapies. This was completed through the application of advanced monitoring technologies and extensive interpretation of multiple databases. During this encounter critical care time was devoted to patient care services described in this note for 33 minutes.  Julian Hy, DO 08/20/21 8:13 AM [facility]

## 2021-08-21 DIAGNOSIS — L89210 Pressure ulcer of right hip, unstageable: Secondary | ICD-10-CM

## 2021-08-21 DIAGNOSIS — Z66 Do not resuscitate: Secondary | ICD-10-CM

## 2021-08-21 DIAGNOSIS — R739 Hyperglycemia, unspecified: Secondary | ICD-10-CM

## 2021-08-21 LAB — GLUCOSE, CAPILLARY
Glucose-Capillary: 136 mg/dL — ABNORMAL HIGH (ref 70–99)
Glucose-Capillary: 163 mg/dL — ABNORMAL HIGH (ref 70–99)
Glucose-Capillary: 188 mg/dL — ABNORMAL HIGH (ref 70–99)
Glucose-Capillary: 162 mg/dL — ABNORMAL HIGH (ref 70–99)
Glucose-Capillary: 174 mg/dL — ABNORMAL HIGH (ref 70–99)

## 2021-08-21 LAB — BASIC METABOLIC PANEL
Anion gap: 5 (ref 5–15)
BUN: 22 mg/dL (ref 8–23)
CO2: 35 mmol/L — ABNORMAL HIGH (ref 22–32)
Calcium: 8.1 mg/dL — ABNORMAL LOW (ref 8.9–10.3)
Chloride: 97 mmol/L — ABNORMAL LOW (ref 98–111)
Creatinine, Ser: 0.72 mg/dL (ref 0.44–1.00)
GFR, Estimated: >60 mL/min (ref >60)
Glucose, Bld: 170 mg/dL — ABNORMAL HIGH (ref 70–99)
Potassium: 4.3 mmol/L (ref 3.5–5.1)
Sodium: 137 mmol/L (ref 135–145)

## 2021-08-21 LAB — CREATININE, SERUM
Creatinine, Ser: 0.62 mg/dL (ref 0.44–1.00)
GFR, Estimated: >60 mL/min (ref >60)

## 2021-08-21 MED ORDER — ALBUMIN HUMAN 25 % IV SOLN
25.0000 g | Freq: Once | INTRAVENOUS | Status: AC
  Administered 2021-08-21: 25 g via INTRAVENOUS
  Filled 2021-08-21: qty 100

## 2021-08-21 MED ORDER — CHLORHEXIDINE GLUCONATE CLOTH 2 % EX PADS
6.0000 | MEDICATED_PAD | Freq: Every day | CUTANEOUS | Status: DC
  Administered 2021-08-21 – 2021-09-04 (×15): 6 via TOPICAL

## 2021-08-21 NOTE — Progress Notes (Signed)
Physical Therapy Treatment Patient Details Name: [name] MRN: [id number] DOB: 03/12/1956 Today's Date: 08/21/2021   History of Present Illness [name] admitted 08/14/2021 with worsening draining right posterior thigh wound and right foot wounds. PMH of COPD on 2 L of oxygen, gout, GERD,chronic lower extremity venous insufficiency.    PT Comments    Patient pleasant and very willing to work with therapy despite pain in LE's from wounds. Pt attempting to initiate moving to EOB but Max+2 assist needed for safety and due to LE pain. Pt was able to complete sit<>stand with RW and take small steps to recliner for OOB activity. BP hypotensive but MAP remained >55 and RN reporting ok to remain up in recliner. Therapist requested to RN that pt not remain up for more than 45-60 minutes due to LE/buttock wounds. She will continue to benefit from skilled PT interventions to progress mobility as able; recommend [facility] at [facility].  Vital Signs Position           BP (MAP) Sitting              87/50 (61) Sitting              88/12 (57) Standing         75/47 (56) Sitting              94/59 (69)    Recommendations for follow up therapy are one component of a multi-disciplinary discharge planning process, led by the attending physician.  Recommendations may be updated based on patient status, additional functional criteria and insurance authorization.  Follow Up Recommendations  Skilled nursing-short term rehab (<3 hours/day)     Assistance Recommended at Discharge Frequent or constant Supervision/Assistance  Patient can return home with the following Two people to help with walking and/or transfers;A lot of help with bathing/dressing/bathroom;Assistance with cooking/housework;Assist for transportation;Direct supervision/assist for medications management;Help with stairs or ramp for entrance;Direct supervision/assist for financial management   Equipment Recommendations   None recommended by PT    Recommendations for Other Services       Precautions / Restrictions Precautions Precautions: Fall Precaution Comments: right posterior thigh wound, right foot(plantar heel), buttock wounds Other Brace: Prevalon boots Restrictions Weight Bearing Restrictions: No     Mobility  Bed Mobility Overal bed mobility: Needs Assistance Bed Mobility: Supine to Sit     Supine to sit: Max assist, +2 for physical assistance, HOB elevated     General bed mobility comments: MAx +2 with use of pad to help pt pivot. pt initiated reaching for bed rail and bring LE's towards EOB but assist needed to fully dangle.    Transfers Overall transfer level: Needs assistance Equipment used: Rolling walker (2 wheels) Transfers: Sit to/from Stand, Bed to chair/wheelchair/BSC Sit to Stand: Mod assist, +2 physical assistance, +2 safety/equipment   Step pivot transfers: Mod assist, +2 safety/equipment       General transfer comment: Mod +2 to rise from EOB with RW and take small steps. Pt completed 2 stands and on second was able to take small side steps to move to recliner. BP low but RN aware and adjusting meds to manage.    Ambulation/Gait                   Stairs             Wheelchair Mobility    Modified Rankin (Stroke Patients Only)       Balance Overall balance assessment: History  of Falls, Needs assistance Sitting-balance support: Bilateral upper extremity supported, Feet supported Sitting balance-Leahy Scale: Poor Sitting balance - Comments: Progressing to fair.   Standing balance support: During functional activity, Reliant on assistive device for balance Standing balance-Leahy Scale: Poor                              Cognition Arousal/Alertness: Awake/alert Behavior During Therapy: WFL for tasks assessed/performed Overall Cognitive Status: No family/caregiver present to determine baseline cognitive functioning                                  General Comments: pt pleasant and happy it is her birthday today. slow processing at times and [facility].        Exercises      General Comments        Pertinent Vitals/Pain Pain Assessment Pain Assessment: Faces Faces Pain Scale: Hurts even more Pain Location: R thigh with bed mobility; RT heel when standing. Pain Descriptors / Indicators: Grimacing, Moaning, Guarding Pain Intervention(s): Limited activity within patient's tolerance, Monitored during session, Repositioned    Home Living                          Prior Function            PT Goals (current goals can now be found in the care plan section) Acute Rehab PT Goals PT Goal Formulation: With patient Time For Goal Achievement: 08/31/21 Potential to Achieve Goals: Fair Progress towards PT goals: Progressing toward goals    Frequency    Min 2X/week      PT Plan Current plan remains appropriate    Co-evaluation PT/OT/SLP Co-Evaluation/Treatment: Yes Reason for Co-Treatment: To address functional/ADL transfers;For patient/therapist safety PT goals addressed during session: Balance;Mobility/safety with mobility;Proper use of DME OT goals addressed during session: ADL's and self-care      AM-PAC PT "6 Clicks" Mobility   Outcome Measure  Help needed turning from your back to your side while in a flat bed without using bedrails?: A Little Help needed moving from lying on your back to sitting on the side of a flat bed without using bedrails?: Total Help needed moving to and from a bed to a chair (including a wheelchair)?: A Lot Help needed standing up from a chair using your arms (e.g., wheelchair or bedside chair)?: A Lot Help needed to walk in hospital room?: A Lot Help needed climbing 3-5 steps with a railing? : Total 6 Click Score: 11    End of Session Equipment Utilized During Treatment: Gait belt;Oxygen Activity Tolerance: Patient tolerated treatment  well Patient left: in chair;with call bell/phone within reach;with chair alarm set (cushioned chair (RN notified not to stay up more than 45-60 minutes due to wounds)) Nurse Communication: Mobility status PT Visit Diagnosis: Unsteadiness on feet (R26.81);Muscle weakness (generalized) (M62.81);Pain;History of falling (Z91.81) Pain - Right/Left: Right Pain - part of body: Leg     Time: [id number] PT Time Calculation (min) (ACUTE ONLY): 34 min  Charges:  $Therapeutic Activity: 8-22 mins                     Verner Mould, DPT Acute Rehabilitation Services Office [phone] Pager [phone]    Jacques Navy 08/21/2021, 4:50 PM

## 2021-08-21 NOTE — Progress Notes (Signed)
Occupational Therapy Treatment Patient Details Name: [name] MRN: [id number] DOB: 06-29-1956 Today's Date: 08/21/2021   History of present illness [name] admitted 08/14/2021 with worsening draining right posterior thigh wound and right foot wounds. PMH of COPD on 2 L of oxygen, gout, GERD,chronic lower extremity venous insufficiency.   OT comments  Patient progressing and showed improved ability to balance at EOB with initial poor sitting balance, progressing to fair, allowing pt to perform EOB simple grooming tasks with Min guard to setup assist, compared to previous session when pt could not balance at EOB without external assistance.  Pt also progressed towards functional bathroom mobility by standing from EOB x 2 with moderate assist of 2 people, and using RW to pivot to recliner to simulate BSC.  Patient remains limited by pain to RLE, impaired cognition with decreased awareness of safety and impairments,  generalized weakness and decreased activity tolerance along with deficits noted below. Pt continues to demonstrate fair rehab potential and would benefit from continued skilled OT to increase safety and independence with ADLs and functional transfers to allow pt to return home safely and reduce caregiver burden and fall risk.    Recommendations for follow up therapy are one component of a multi-disciplinary discharge planning process, led by the attending physician.  Recommendations may be updated based on patient status, additional functional criteria and insurance authorization.    Follow Up Recommendations  Skilled nursing-short term rehab (<3 hours/day)    Assistance Recommended at Discharge Frequent or constant Supervision/Assistance  Patient can return home with the following  Two people to help with walking and/or transfers;Two people to help with bathing/dressing/bathroom;Assistance with cooking/housework;Assist for transportation;Help with stairs  or ramp for entrance;Direct supervision/assist for financial management   Equipment Recommendations  Other (comment)    Recommendations for Other Services      Precautions / Restrictions Precautions Precautions: Fall Precaution Comments: right posterior thigh wound, right foot(plantar heel), buttock wounds Required Braces or Orthoses: Other Brace Other Brace: Prevalon boots Restrictions Weight Bearing Restrictions: No       Mobility Bed Mobility Overal bed mobility: Needs Assistance Bed Mobility: Supine to Sit     Supine to sit: Max assist, +2 for physical assistance, HOB elevated     General bed mobility comments: Max As of 2 to advance LEs off EOB and raise trunk.  Step by step multimodal cues for sequencing, use of bed rails.    Transfers Overall transfer level:  (see ADLs for OT)                       Balance Overall balance assessment: History of Falls, Needs assistance Sitting-balance support: Bilateral upper extremity supported, Feet supported Sitting balance-Leahy Scale: Poor Sitting balance - Comments: Progressing to fair.   Standing balance support: During functional activity, Reliant on assistive device for balance Standing balance-Leahy Scale: Poor                             ADL either performed or assessed with clinical judgement   ADL Overall ADL's : Needs assistance/impaired     Grooming: Oral care;Sitting;Set up Grooming Details (indicate cue type and reason): Pt sat EOB and swabbed mouth and wiped face with setup.             Lower Body Dressing: Total assistance;Bed level Lower Body Dressing Details (indicate cue type and reason): To don and doff socks. Toilet Transfer:  Rolling walker (2 wheels);Stand-pivot;Minimal assistance;Moderate assistance;+2 for physical assistance Toilet Transfer Details (indicate cue type and reason): Pt stood from EOB x 2 reps to RW with cues and Moderate assist of 2 people. Increased  time/effort. Pt took lateral steps with RW and cues for sequencing with Min As of 2. Pt sat to rest then stood 2nd time and pivoted to recliner with Moderate assist and 2nd person for safety/assist as needed. Stand to sit with Min As of 2 and cues. Posterior scooting in chair: Total As of t. Toileting- Clothing Manipulation and Hygiene: Total assistance;Bed level       Functional mobility during ADLs: Minimal assistance;Moderate assistance;+2 for physical assistance;Rolling walker (2 wheels);Cueing for sequencing      Extremity/Trunk Assessment Upper Extremity Assessment RUE Deficits / Details: AROM within functional limits. elbow 3+/5, grip 3/5 LUE Deficits / Details: AROM withing functional limits, strength grossly 4/5            Vision   Vision Assessment?: No apparent visual deficits   Perception Perception Perception: Within Functional Limits   Praxis Praxis Praxis: Intact    Cognition Arousal/Alertness: Awake/alert Behavior During Therapy: WFL for tasks assessed/performed Overall Cognitive Status: No family/caregiver present to determine baseline cognitive functioning                                 General Comments: Del[facility] processing [facility]. Decreased awareness of impairments and full situation. Very pleseant and expressed some happiness that it is her birthday today.        Exercises      Shoulder Instructions       General Comments      Pertinent Vitals/ Pain       Pain Assessment Pain Score: 8  Pain Location: R thigh with bed mobility; RT heel when standing. Pain Descriptors / Indicators: Grimacing, Moaning, Guarding Pain Intervention(s): Limited activity within patient's tolerance, Monitored during session, Premedicated before session, Repositioned  Home Living                                          Prior Functioning/Environment              Frequency  Min 2X/week        Progress Toward  Goals  OT Goals(current goals can now be found in the care plan section)  Progress towards OT goals: Progressing toward goals  Acute Rehab OT Goals OT Goal Formulation: With patient Time For Goal Achievement: 09/01/21 Potential to Achieve Goals: Fair  Plan Discharge plan remains appropriate    Co-evaluation    PT/OT/SLP Co-Evaluation/Treatment: Yes Reason for Co-Treatment: Complexity of the patient's impairments (multi-system involvement);For patient/therapist safety;To address functional/ADL transfers   OT goals addressed during session: ADL's and self-care      AM-PAC OT "6 Clicks" Daily Activity     Outcome Measure   Help from another person eating meals?: A Little Help from another person taking care of personal grooming?: A Little Help from another person toileting, which includes using toliet, bedpan, or urinal?: Total Help from another person bathing (including washing, rinsing, drying)?: A Lot Help from another person to put on and taking off regular upper body clothing?: A Little Help from another person to put on and taking off regular lower body clothing?: Total 6 Click Score: 13  End of Session Equipment Utilized During Treatment: Gait belt;Rolling walker (2 wheels)  OT Visit Diagnosis: Other abnormalities of gait and mobility (R26.89);History of falling (Z91.81);Muscle weakness (generalized) (M62.81)   Activity Tolerance Patient limited by pain   Patient Left in chair;with call bell/phone within reach   Nurse Communication Mobility status;Other (comment) (Blood pressure and pain)        Time: 1443-1540 OT Time Calculation (min): 35 min  Charges: OT General Charges $OT Visit: 1 Visit OT Treatments $Self Care/Home Management : 8-22 mins  Victorino Dike, OT Acute Rehab Services Office: [phone] 08/21/2021  Theodoro Clock 08/21/2021, 10:10 AM

## 2021-08-21 NOTE — Progress Notes (Signed)
NAME:  [name], MRN:  [id number], DOB:  [birth year], LOS: 6 ADMISSION DATE:  08/14/2021, CONSULTATION DATE: August 15, 2021 REFERRING MD: ED, CHIEF COMPLAINT: Leg pain and swelling  BRIEF  [name] with past medical history of COPD on 2 L of oxygen patient is with GERD chronic lower extremity venous insufficiency edema wound was admitted to the hospital 2 months ago with what seems to be chronic ulcer she was supposed to follow-up with wound care and go to rehab but she ended up going to her home never followed up in the wound In the ED arrest patient was found also to have bedbugs She was given 3 L of fluid continue to be hypotensive required low-dose Levophed at 6 mics per minute. Patient had no fever chills rigors no nausea vomiting diarrhea no chest pain no abdominal pain she has significant pain in the back of her right thigh with a swelling of her right lower extremity   PMH  COPD HLD Gout  EVENTS   08/15/2021 -  repeat rounds: History review shows admission 06/16/2021 - 06/23/2021 with history of COPD on 2 L, depression on Wellbutrin and venlafaxine and anxiety on alprazolam, acid reflux hypothyroidism chronic lower extremity wounds [follows with Dr. Duda] hyperlipidemia, failure to thrive self-neglect with evidence of home infestation by bedbugs and fleas in November 2022, limited mobility using walker, that admission was for sepsis secondary to bilateral lower extremity cellulitis.  And stage II sacral decub.  She consistently refused dressing changes.  Patient discharged to [facility] to recent [place] [facility] for antibiotics and dressing changes but subsequently went back to her house which according to the patient because she was ambulatory.  However according to the [name] patient has been with a [name] all along and has refused to participate in care and also has been having failure to thrive with diminished eating.  The [name] is asking for placement.  The  [name] is supportive of her DNR and DNI  -1/15 Levophed 9 mcg with good blood pressure.  She is awake and interactive.  She has significant pain in her lower extremities.  RN describes significant right thigh posterior ulcer and right foot ulcer and sacral decub all present on admission. 1/16- WOC consult, GS consult at their recommendation-- no surgical intervention needed.   SUBJECTIVE/OVERNIGHT/INTERVAL HX  [name] is feeling well and denies complaints today.  Objective   Blood pressure (!) 104/54, pulse 84, temperature 98.1 F (36.7 C), temperature source Axillary, resp. rate 14, height 5\' 4"  (1.626 m), weight 89.2 kg, SpO2 97 %.        Intake/Output Summary (Last 24 hours) at 08/21/2021 0729 Last data filed at 08/21/2021 0246 Gross per 24 hour  Intake 721.73 ml  Output 2725 ml  Net -2003.27 ml    Filed Weights   08/17/21 0430 08/20/21 0355 08/21/21 0648  Weight: 83.8 kg 87.6 kg 89.2 kg    General Appearance:  chronically ill appearing woman lying in bed in NAD HEENT: [place]/AT, eyes anicteric Neck: supple  Lungs: Breathing comfortably on [place], CTAB. Speaking in full sentences. Heart: S1S2, RRR Abdomen: soft, NT Extremities: improving LE edema, legs wrapped Skin: warm, dry, pallor, no rashes diffusely neurologic: Awake and alert, moving all extremities. Answering questions appropriately.  Bicarb 35 BUN 22 Cr 0.72 Blood cultures> NG, final  Assessment & Plan:   Septic shock due to cellulitis RLE - con't vanc, zosyn.  --albumin today to continue efforts to liberate her from norepi. Goal  SBP >85.  - Continue midodrine 15 mg 3 times daily - Continue stress dose steroids-hydrocortisone and fludrocortisone.  Acute respiratory failure with hypoxia COPD not acutely exacerbated. Not currently requiring oxygen and doesn't use at home despite previous prescription for home oxygen. - Continue DuoNebs twice daily, can start LAMA at discharge - Recommend outpatient PFTs. -No  diuretics until off pressors -Pulmonary hygiene, out of bed mobility  Decubitus ulcers-- R foot, R thigh, sacrum -Appreciate surgery's input.  Appreciate WOC team's management. -oxycodone for pain control, fentanyl PRN for dressing changes.  Anxiety Chronic opiate use for chronic LE pain -Continue Wellbutrin, Effexor, gabapentin.  Has Xanax as needed-all home meds  Mild hyponatremia, resolved - Avoid excessive free water  GERD -Continue PPI, has been an outpatient medication   Anemia of chronic disease- acute on chronic  -Transfuse for hemoglobin less than 7 or hemodynamically significant bleeding - Monitor periodically, trying to avoid over phlebotomizing her  Reactive thrombocytosis due to sepsis -DVT prophylaxis   Prediabetes, hyperglycemia - Continue Levemir 5 units daily - Sliding scale insulin as needed - Goal BG 140-180 - Likely can discontinue long-acting insulin when discontinuing steroids  Troponin elevation, likely 2/2 sepsis. Echo with normal EF and no RWMA, so unlikely ischemic disease -no additional monitoring  Hypothyroidism -Continue Synthroid  Failure to thrive   Self-neglect with poor motivation Immobility using walker  -PT, OT, out of bed mobility - Doing well with Ensure supplements and eating well while in the hospital.  She is encouraged to continue this.  Severe protein calorie malnutrition  cachexia - Encourage p.o. intake  Poor social situation with bedbugs and fleas in the house > have not seen any since she has been here. Noncompliance with medical instructions -Need social work assistance with placement  Patient updated at bedside today.  Best practice:  Diet: Carb modified Pain/Anxiety/Delirium protocol (if indicated): VAP protocol (if indicated):  DVT prophylaxis: Lovenox GI prophylaxis: PPI Glucose control: SSI, levemir Mobility: ambulate with assistance   Code Status: DNR (08/15/2021 at admission: [name] is in favor of DNR.   Also spoke independently to the patient.  Explained to the patient that we are trying to get her better but in the process if her heart stops or her breathing stops then natural death has occurred.  Expressed to the patient that in the event this was to happen would be recognized this is natural death or try to intervene in terms of intubation or CPR.  Expressed to the patient that given her medical conditions probably not in her best interest to go through CPR and intubation.  Patient states she was aligned with this and confirmed DNR/DNI.  Communicated this with nurse Ebony Hail ). Family Communication: Called [name] relative [id number] -> [name] reports that she is very difficult and challenging to take care of.   [name] is requesting placement in a nursing home.  [name] is in favor of DNR  Disposition: ICU until off pressors    LABS    PULMONARY Recent Labs  Lab 08/15/21 1423  PHART 7.436  PCO2ART 37.1  PO2ART 79.4*  HCO3 24.5  O2SAT 95.6     CBC Recent Labs  Lab 08/18/21 0435 08/19/21 0522 08/19/21 2015  HGB 8.6* 8.9* 9.0*  HCT 29.4* 30.6* 31.3*  WBC 15.7* 13.9* 14.3*  PLT 651* 684* 781*     COAGULATION No results for input(s): INR in the last 168 hours.  CARDIAC  No results for input(s): TROPONINI in the last 168 hours. No results  for input(s): PROBNP in the last 168 hours.   CHEMISTRY Recent Labs  Lab 08/15/21 1234 08/16/21 0500 08/17/21 0422 08/18/21 0435 08/19/21 0522 08/19/21 2015 08/20/21 0350 08/21/21 0632  NA  --  134* 134* 134* 133* 135  --   --   K  --  3.4* 4.6 4.0 4.3 4.4  --   --   CL  --  102 100 98 96* 95*  --   --   CO2  --  26 26 31  32 32  --   --   GLUCOSE  --  145* 184* 136* 173* 167*  --   --   BUN  --  9 8 9 14 18   --   --   CREATININE  --  0.67 0.76 0.60 0.83 0.83 0.71 0.62  CALCIUM  --  8.2* 8.6* 8.2* 8.4* 8.4*  --   --   MG 2.4  --  1.7 2.3 2.0  --   --   --   PHOS  --  3.8 3.6  --  3.1  --   --   --     Estimated  Creatinine Clearance: 75.8 mL/min (by C-G formula based on SCr of 0.62 mg/dL).   This patient is critically ill with multiple organ system failure which requires frequent high complexity decision making, assessment, support, evaluation, and titration of therapies. This was completed through the application of advanced monitoring technologies and extensive interpretation of multiple databases. During this encounter critical care time was devoted to patient care services described in this note for 33 minutes.  Julian Hy, DO 08/21/21 9:06 AM [facility]

## 2021-08-21 NOTE — Progress Notes (Signed)
Occupational Therapy Evaluation Patient Details Name: [name] MRN: [id number] DOB: 08/08/1955 Today's Date: 08/21/2021    08/18/21 1258  OT Visit Information  Last OT Received On 08/18/21  Assistance Needed +2  History of Present Illness [name] admitted 08/14/2021 with worsening draining right posterior thigh wound and right foot wounds. PMH of COPD on 2 L of oxygen, gout, GERD,chronic lower extremity venous insufficiency.  Precautions  Precautions Fall  Precaution Comments right posterior thigh wound, right foot(plantar heel), buttock wounds  Restrictions  Weight Bearing Restrictions No  Home Living  Family/patient expects to be discharged to: Private residence  Living Arrangements Alone  Available Help at Discharge Family;Friend(s);Available PRN/intermittently  Type of Home Mobile home  Home Access Stairs to enter  Entrance Stairs-Number of Steps 4  Entrance Stairs-Rails Can reach both  Home Layout One level  Bathroom Shower/Tub Walk-in Arts development officer (4 wheels);Shower seat;BSC/3in1  Additional Comments has 3 in 1 over her regular toilet  Prior Function  Prior Level of Function  Needs assist  Physical Assist  Mobility (physical);ADLs (physical)  Mobility (physical) Transfers  ADLs (physical) Bathing;Dressing;Toileting;IADLs  ADLs Comments patient has been at her cousin's since Thanksgiving and was not ambulatory. Staying on a love seat and transferring to a 3 N 1, sponge bathing with family assist.  Communication  Communication HOH  Pain Assessment  Pain Assessment Faces  Faces Pain Scale 6  Pain Location R thigh with bed mobility  Pain Descriptors / Indicators Grimacing  Pain Intervention(s) Limited activity within patient's tolerance  Cognition  Arousal/Alertness Awake/alert  Behavior During Therapy [facility] for tasks assessed/performed  Overall Cognitive Status No family/caregiver present to  determine baseline cognitive functioning  General Comments Patient believes its the 27th of Jan otherwise oriented. Appears to lack insight to deficits and how difficult it would be to go home alone.  Upper Extremity Assessment  Upper Extremity Assessment RUE deficits/detail;LUE deficits/detail  RUE Deficits / Details AROM within functional limits. elbow 3+/5, grip 3/5  LUE Deficits / Details AROM withing functional limits, strength grossly 4/5  Lower Extremity Assessment  Lower Extremity Assessment Defer to PT evaluation  ADL  Overall ADL's  Needs assistance/impaired  Eating/Feeding Set up;Bed level  Grooming Wash/dry face;Wash/dry hands;Set up;Bed level  Upper Body Bathing Minimal assistance;Bed level  Lower Body Bathing Total assistance;Bed level  Upper Body Dressing  Bed level;Minimal assistance  Lower Body Dressing Total assistance;Bed level  Toilet Transfer Details (indicate cue type and reason) Deferred, RN request due to hypotension not to get OOB. Anticipate patient would need significant assistance +2 due to limited mobility for months prior to hospitalization  Toileting- Clothing Manipulation and Hygiene Total assistance;Bed level  General ADL Comments Patient was needing assistance at baseline with self care due to leg wounds, weakness, pain. Patient needing significant assist at this time due to medical complexity and progressive weakness from lack of OOB mobility  Bed Mobility  Overal bed mobility Needs Assistance  Bed Mobility Rolling  Rolling Max assist  General bed mobility comments Max A both left and right rolling, attempts to assist with upper extremity on bed rail however difficulty rolling far enough to offload buttock  Transfers  General transfer comment Not tested per RN request 2* patient hypotension  Balance  Overall balance assessment History of Falls  General Comments  General comments (skin integrity, edema, etc.) O2 92% on RA and BP semi-supine 87/70  OT -  End of Session  Activity  Tolerance Patient limited by pain;Treatment limited secondary to medical complications (Comment) (hypotension)  Patient left in bed;with call bell/phone within reach;with bed alarm set  Nurse Communication Mobility status  OT Assessment  OT Recommendation/Assessment Patient needs continued OT Services  OT Visit Diagnosis Other abnormalities of gait and mobility (R26.89);History of falling (Z91.81);Muscle weakness (generalized) (M62.81)  OT Problem List Decreased strength;Decreased activity tolerance;Impaired balance (sitting and/or standing);Decreased safety awareness;Decreased cognition;Pain  OT Plan  OT Frequency (ACUTE ONLY) Min 2X/week  OT Treatment/Interventions (ACUTE ONLY) Self-care/ADL training;Therapeutic exercise;Balance training;Patient/family education;Therapeutic activities;Energy conservation  AM-PAC OT "6 Clicks" Daily Activity Outcome Measure (Version 2)  Help from another person eating meals? 3  Help from another person taking care of personal grooming? 3  Help from another person toileting, which includes using toliet, bedpan, or urinal? 1  Help from another person bathing (including washing, rinsing, drying)? 2  Help from another person to put on and taking off regular upper body clothing? 3  Help from another person to put on and taking off regular lower body clothing? 1  6 Click Score 13  Progressive Mobility  What is the highest level of mobility based on the progressive mobility assessment? Level 1 (Bedfast) - Unable to balance while sitting on edge of bed  OT Recommendation  Follow Up Recommendations Skilled nursing-short term rehab (<3 hours/day)  Assistance recommended at discharge Frequent or constant Supervision/Assistance  Patient can return home with the following Two people to help with walking and/or transfers;Two people to help with bathing/dressing/bathroom;Assistance with cooking/housework;Assist for transportation;Help with stairs  or ramp for entrance  Functional Status Assessent Patient has had a recent decline in their functional status and/or demonstrates limited ability to make significant improvements in function in a reasonable and predictable amount of time  OT Equipment Other (comment) (TBD)  Individuals Consulted  Consulted and Agree with Results and Recommendations Patient  Acute Rehab OT Goals  Patient Stated Goal Go home with home health aids  OT Goal Formulation With patient  Time For Goal Achievement 09/01/21  Potential to Achieve Goals Fair  OT Time Calculation  OT Start Time (ACUTE ONLY) 1030  OT Stop Time (ACUTE ONLY) 1048  OT Time Calculation (min) 18 min  OT General Charges  $OT Visit 1 Visit  OT Evaluation  $OT Eval Low Complexity 1 Low  Written Expression  Dominant Hand Right     *Note pulled forward from Marlyce Huge from 08/18/2021

## 2021-08-21 NOTE — Progress Notes (Signed)
Pharmacy Antibiotic Note  [name] is a 66 y.o. female with presented to the ED on 08/14/2021 with foul-smelling and pus from right upper leg wound.  She's currently on vancomycin and zosyn for cellulitis/wound infection.  Today, 08/21/2021: - day #7 abx - afeb - wbc 14.3 on 1/18 - scr stable at 0.62 - all cultures have been negative  Plan: - continue vancomycin 1250 mg IV q24h - Will check vancomycin peak and trough levels with the 10p dose tonight to assess current vancomycin regimen - zosyn 3.375 gm IV q8h (infuse over 4 hrs) - daily scr  ________________________________________  Height: 5\' 4"  (162.6 cm) Weight: 89.2 kg (196 lb 10.4 oz) IBW/kg (Calculated) : 54.7  Temp (24hrs), Avg:98 F (36.7 C), Min:97.6 F (36.4 C), Max:98.2 F (36.8 C)  Recent Labs  Lab 08/15/21 0027 08/15/21 0315 08/15/21 0742 08/16/21 0500 08/16/21 1034 08/17/21 0110 08/17/21 0422 08/18/21 0435 08/19/21 0522 08/19/21 2015 08/20/21 0350  WBC  --   --    < > 11.0*  --  14.2*  --  15.7* 13.9* 14.3*  --   CREATININE  --   --    < > 0.67  --   --  0.76 0.60 0.83 0.83 0.71  LATICACIDVEN 1.8 2.6*  --   --  2.0*  --  2.1*  --   --   --   --    < > = values in this interval not displayed.    Estimated Creatinine Clearance: 75.8 mL/min (by C-G formula based on SCr of 0.71 mg/dL).    Allergies  Allergen Reactions   Gemfibrozil     Other reaction(s): Sick to stomach   Nitroglycerin     Other reaction(s): Cough, Irritation   Other Other (See Comments)    Grape fruit juice - unknown   Pravastatin     Other reaction(s): Dizzy   Sulfamethoxazole-Trimethoprim     Other reaction(s): N/V   Tyloxapol     Other reaction(s): Does not work   1/14 vanc >> 1/14 cefepime/Flagyl x1 1/14 Zosyn >>    1/14 BCx2 ngF 1/14 MRSA PCR: +  Thank you for allowing pharmacy to be a part of this patients care.  2/14 08/21/2021 7:03 AM

## 2021-08-22 LAB — CBC
HCT: 30 % — ABNORMAL LOW (ref 36.0–46.0)
Hemoglobin: 8.4 g/dL — ABNORMAL LOW (ref 12.0–15.0)
MCH: 25.5 pg — ABNORMAL LOW (ref 26.0–34.0)
MCHC: 28 g/dL — ABNORMAL LOW (ref 30.0–36.0)
MCV: 90.9 fL (ref 80.0–100.0)
Platelets: 526 10*3/uL — ABNORMAL HIGH (ref 150–400)
RBC: 3.3 MIL/uL — ABNORMAL LOW (ref 3.87–5.11)
RDW: 23.1 % — ABNORMAL HIGH (ref 11.5–15.5)
WBC: 12.9 10*3/uL — ABNORMAL HIGH (ref 4.0–10.5)
nRBC: 0.2 % (ref 0.0–0.2)

## 2021-08-22 LAB — BASIC METABOLIC PANEL
Anion gap: 7 (ref 5–15)
BUN: 22 mg/dL (ref 8–23)
CO2: 36 mmol/L — ABNORMAL HIGH (ref 22–32)
Calcium: 8.3 mg/dL — ABNORMAL LOW (ref 8.9–10.3)
Chloride: 96 mmol/L — ABNORMAL LOW (ref 98–111)
Creatinine, Ser: 0.7 mg/dL (ref 0.44–1.00)
GFR, Estimated: >60 mL/min (ref >60)
Glucose, Bld: 179 mg/dL — ABNORMAL HIGH (ref 70–99)
Potassium: 4.3 mmol/L (ref 3.5–5.1)
Sodium: 139 mmol/L (ref 135–145)

## 2021-08-22 LAB — GLUCOSE, CAPILLARY
Glucose-Capillary: 108 mg/dL — ABNORMAL HIGH (ref 70–99)
Glucose-Capillary: 139 mg/dL — ABNORMAL HIGH (ref 70–99)
Glucose-Capillary: 143 mg/dL — ABNORMAL HIGH (ref 70–99)
Glucose-Capillary: 145 mg/dL — ABNORMAL HIGH (ref 70–99)
Glucose-Capillary: 159 mg/dL — ABNORMAL HIGH (ref 70–99)
Glucose-Capillary: 165 mg/dL — ABNORMAL HIGH (ref 70–99)
Glucose-Capillary: 185 mg/dL — ABNORMAL HIGH (ref 70–99)

## 2021-08-22 LAB — VANCOMYCIN, PEAK: Vancomycin Pk: 36 ug/mL (ref 30–40)

## 2021-08-22 LAB — VANCOMYCIN, TROUGH: Vancomycin Tr: 12 ug/mL — ABNORMAL LOW (ref 15–20)

## 2021-08-22 MED ORDER — INSULIN DETEMIR 100 UNIT/ML ~~LOC~~ SOLN
10.0000 [IU] | Freq: Every day | SUBCUTANEOUS | Status: DC
  Administered 2021-08-22 – 2021-08-24 (×3): 10 [IU] via SUBCUTANEOUS
  Filled 2021-08-22 (×3): qty 0.1

## 2021-08-22 NOTE — Progress Notes (Signed)
Pharmacy Antibiotic Note  [name] is a 66 y.o. female with presented to the ED on 08/14/2021 with foul-smelling and pus from right upper leg wound.  She's currently on vancomycin and zosyn for cellulitis/wound infection.  Today, 08/22/2021: - day #8 abx - afeb - scr has been stable at 0.6-0.8 - all cultures have been negative; MRSA PCR + on 1/14 - Vancomycin levels at goal around 7th dose: Vpk 36, Vtr 12, Calculated AUC 546.6  Plan: - Continue vancomycin 1250 mg IV q24h - zosyn 3.375 gm IV q8h (infuse over 4 hrs) - Check Vancomycin levels weekly - Monitor renal fxn ________________________________________  Height: 5\' 4"  (162.6 cm) Weight: 90.2 kg (198 lb 13.7 oz) IBW/kg (Calculated) : 54.7  Temp (24hrs), Avg:98.2 F (36.8 C), Min:97.9 F (36.6 C), Max:98.8 F (37.1 C)  Recent Labs  Lab 08/16/21 1034 08/17/21 0110 08/17/21 0422 08/18/21 0435 08/19/21 0522 08/19/21 2015 08/20/21 0350 08/21/21 0632 08/22/21 0142 08/22/21 0423 08/22/21 2100  WBC  --  14.2*  --  15.7* 13.9* 14.3*  --   --   --  12.9*  --   CREATININE  --   --  0.76 0.60 0.83 0.83 0.71 0.72   0.62  --  0.70  --   LATICACIDVEN 2.0*  --  2.1*  --   --   --   --   --   --   --   --   VANCOTROUGH  --   --   --   --   --   --   --   --   --   --  12*  VANCOPEAK  --   --   --   --   --   --   --   --  36  --   --      Estimated Creatinine Clearance: 76.3 mL/min (by C-G formula based on SCr of 0.7 mg/dL).    Allergies  Allergen Reactions   Gemfibrozil     Other reaction(s): Sick to stomach   Nitroglycerin     Other reaction(s): Cough, Irritation   Other Other (See Comments)    Grape fruit juice - unknown   Pravastatin     Other reaction(s): Dizzy   Sulfamethoxazole-Trimethoprim     Other reaction(s): N/V   Tyloxapol     Other reaction(s): Does not work   1/14 vanc >> 1/14 cefepime/Flagyl x1 1/14 Zosyn >>    1/14 BCx2 ngF 1/14 MRSA PCR: +  Thank you for allowing pharmacy to be a part of  this patients care.  2/14 PharmD 08/22/2021 10:47 PM

## 2021-08-22 NOTE — Progress Notes (Addendum)
NAME:  [name], MRN:  [id number], DOB:  June 16, 1956, LOS: 7 ADMISSION DATE:  08/14/2021, CONSULTATION DATE: August 15, 2021 REFERRING MD: ED, CHIEF COMPLAINT: Leg pain and swelling  BRIEF  [name] with past medical history of COPD on 2 L of oxygen patient is with GERD chronic lower extremity venous insufficiency edema wound was admitted to the hospital 2 months PTA with what seems to be chronic ulcer she was supposed to follow-up with wound care and go to rehab but she ended up going to her home never followed up in the wound In the ED   patient was found also to have bedbugs She was given 3 L of fluid continue to be hypotensive required low-dose Levophed at 6 mics per minute. Patient had no fever chills rigors no nausea vomiting diarrhea no chest pain no abdominal pain she has significant pain in the back of her right thigh with a swelling of her right lower extremity   PMH  COPD HLD Gout  EVENTS   08/15/2021 -  repeat rounds: History review shows admission 06/16/2021 - 06/23/2021 with history of COPD on 2 L, depression on Wellbutrin and venlafaxine and anxiety on alprazolam, acid reflux hypothyroidism chronic lower extremity wounds [follows with Dr. Duda] hyperlipidemia, failure to thrive self-neglect with evidence of home infestation by bedbugs and fleas in November 2022, limited mobility using walker, that admission was for sepsis secondary to bilateral lower extremity cellulitis.  And stage II sacral decub.  She consistently refused dressing changes.  Patient discharged to [facility] to recent [place] home for antibiotics and dressing changes but subsequently went back to her house which according to the patient because she was ambulatory.  However according to the cousin patient has been with a cousin all along and has refused to participate in care and also has been having failure to thrive with diminished eating.  The cousin is asking for placement.  The cousin  is supportive of her DNR and DNI  -1/15 Levophed 9 mcg with good blood pressure.  She is awake and interactive.  She has significant pain in her lower extremities.  RN describes significant right thigh posterior ulcer and right foot ulcer and sacral decub all present on admission. 1/16- WOC consult, GS consult at their recommendation-- no surgical intervention needed.  Scheduled Meds:  aspirin  81 mg Oral Daily   buPROPion  150 mg Oral QPM   Chlorhexidine Gluconate Cloth  6 each Topical Daily   collagenase   Topical Daily   enoxaparin (LOVENOX) injection  40 mg Subcutaneous Q24H   feeding supplement  237 mL Oral TID BM   fludrocortisone  0.1 mg Oral Daily   gabapentin  300 mg Oral TID   hydrocortisone sod succinate (SOLU-CORTEF) inj  100 mg Intravenous Q12H   influenza vac split quadrivalent PF  0.5 mL Intramuscular Tomorrow-1000   insulin aspart  0-9 Units Subcutaneous Q4H   insulin detemir  10 Units Subcutaneous Daily   ipratropium-albuterol  3 mL Nebulization BID   levothyroxine  150 mcg Oral Q0600   mouth rinse  15 mL Mouth Rinse BID   midodrine  15 mg Oral TID WC   nutrition supplement (JUVEN)  1 packet Oral BID BM   ondansetron (ZOFRAN) IV  4 mg Intravenous Q6H   pantoprazole  40 mg Oral Daily   polyethylene glycol  17 g Oral Daily   senna  1 tablet Oral Daily   sodium chloride flush  10-40 mL Intracatheter  Q12H   sodium hypochlorite   Topical BID   venlafaxine XR  150 mg Oral QHS   Continuous Infusions:  sodium chloride Stopped (08/17/21 1512)   norepinephrine (LEVOPHED) Adult infusion 2 mcg/min (08/22/21 0450)   piperacillin-tazobactam (ZOSYN)  IV 3.375 g (08/22/21 0836)   vancomycin Stopped (08/21/21 2345)   PRN Meds:.ALPRAZolam, docusate sodium, fentaNYL (SUBLIMAZE) injection, magic mouthwash w/lidocaine, oxyCODONE, polyethylene glycol, prochlorperazine, sodium chloride flush   SUBJECTIVE/OVERNIGHT/INTERVAL HX  Responses a bit slow but knows where she is and why    Objective   Blood pressure 111/65, pulse 77, temperature 98.2 F (36.8 C), temperature source Axillary, resp. rate 15, height 5\' 4"  (1.626 m), weight 90.2 kg, SpO2 95 %.        Intake/Output Summary (Last 24 hours) at 08/22/2021 0945 Last data filed at 08/22/2021 0645 Gross per 24 hour  Intake 545 ml  Output 1750 ml  Net -1205 ml   Filed Weights   08/20/21 0355 08/21/21 0648 08/22/21 0631  Weight: 87.6 kg 89.2 kg 90.2 kg   Tmax  98.3  General appearance:    elderly wf nad sitting up in bed at about 45 degrees   At Rest 02 sats  95% on 2lpm   No jvd Oropharynx clear,  mucosa nl Neck supple Lungs with min  rhonchi bilaterally RRR no s3 or or sign murmur Abd soft with nl  excursion      Assessment & Plan:   Septic shock due to cellulitis RLE though note PCT was nl this admit and I/0s continue to be neg despite effors to volume expand - con't  zosyn.  - Continue midodrine 15 mg 3 times daily - Continue stress dose steroids-hydrocortisone and fludrocortisone.  Acute respiratory failure with hypoxia and ? Component hypercarbia COPD -  still 02 dep at 2lpm am 1/21 >> Continue DuoNebs twice daily, can start LAMA at discharge >> Pulmonary hygiene, out of bed mobility  Decubitus ulcers-- R foot, R thigh, sacrum -Surgery / WOC have seen  >>  oxycodone for pain control, fentanyl PRN for dressing changes.  Anxiety Chronic opiate use for chronic LE pain >>>  Continue Wellbutrin, Effexor, gabapentin.  Has Xanax as needed-all home meds  Mild hyponatremia, resolved - Avoid excessive free water  GERD -Continue PPI, has been an outpatient medication   Anemia of chronic disease- acute on chronic    Lab Results  Component Value Date   HGB 8.4 (L) 08/22/2021   HGB 9.0 (L) 08/19/2021   HGB 8.9 (L) 08/19/2021    -Transfuse for hemoglobin less than 7 or hemodynamically significant bleeding - Monitor periodically, trying to avoid over phlebotomizing her >>> consider  transfusion to get off pressors   Reactive thrombocytosis due to sepsis Lab Results  Component Value Date   PLT 526 (H) 08/22/2021   PLT 781 (H) 08/19/2021   PLT 684 (H) 08/19/2021    -DVT prophylaxis   Prediabetes, hyperglycemia - Increase Levmir to 10 units daily noting neg I/0s may represent some element of ongoing osmotic diuresis  - Sliding scale insulin as needed - Goal BG 140 to lower renal threshold for osm diuresis     Troponin elevation, likely 2/2 sepsis. Echo with normal EF and no RWMA, so unlikely ischemic disease -no additional monitoring  Hypothyroidism -Continue Synthroid  Failure to thrive   Self-neglect with poor motivation Immobility using walker  -PT, OT, out of bed mobility - Doing well with Ensure supplements and eating well while in the hospital.  She is encouraged to continue this.  Severe protein calorie malnutrition  cachexia - Encourage p.o. intake  Poor social situation with bedbugs and fleas in the house > have not seen any since she has been here. Noncompliance with medical instructions -Need social work assistance with placement  Patient updated at bedside today.  Best practice:  Diet: Carb modified Pain/Anxiety/Delirium protocol (if indicated): VAP protocol (if indicated):  DVT prophylaxis: Lovenox GI prophylaxis: PPI Glucose control: SSI, levemir Mobility: ambulate with assistance   Code Status: DNR (08/15/2021 at admission: Cousin is in favor of DNR.  Also spoke independently to the patient.  Explained to the patient that we are trying to get her better but in the process if her heart stops or her breathing stops then natural death has occurred.  Expressed to the patient that in the event this was to happen would be recognized this is natural death or try to intervene in terms of intubation or CPR.  Expressed to the patient that given her medical conditions probably not in her best interest to go through CPR and intubation.  Patient  states she was aligned with this and confirmed DNR/DNI.  Communicated this with nurse Ebony Hail ). Family Communication: Called [name] relative [id number] -> cousin reports that she is very difficult and challenging to take care of.   Cousin is requesting placement in a nursing home.  Cousin is in favor of DNR  Disposition: ICU until off pressors    LABS    PULMONARY Recent Labs  Lab 08/15/21 1423  PHART 7.436  PCO2ART 37.1  PO2ART 79.4*  HCO3 24.5  O2SAT 95.6    CBC Recent Labs  Lab 08/19/21 0522 08/19/21 2015 08/22/21 0423  HGB 8.9* 9.0* 8.4*  HCT 30.6* 31.3* 30.0*  WBC 13.9* 14.3* 12.9*  PLT 684* 781* 526*    COAGULATION No results for input(s): INR in the last 168 hours.  CARDIAC  No results for input(s): TROPONINI in the last 168 hours. No results for input(s): PROBNP in the last 168 hours.   CHEMISTRY Recent Labs  Lab 08/15/21 1234 08/16/21 0500 08/16/21 0500 08/17/21 0422 08/18/21 0435 08/19/21 0522 08/19/21 2015 08/20/21 0350 08/21/21 0632 08/22/21 0423  NA  --  134*   < > 134* 134* 133* 135  --  137 139  K  --  3.4*   < > 4.6 4.0 4.3 4.4  --  4.3 4.3  CL  --  102   < > 100 98 96* 95*  --  97* 96*  CO2  --  26   < > 26 31 32 32  --  35* 36*  GLUCOSE  --  145*   < > 184* 136* 173* 167*  --  170* 179*  BUN  --  9   < > 8 9 14 18   --  22 22  CREATININE  --  0.67   < > 0.76 0.60 0.83 0.83 0.71 0.72   0.62 0.70  CALCIUM  --  8.2*   < > 8.6* 8.2* 8.4* 8.4*  --  8.1* 8.3*  MG 2.4  --   --  1.7 2.3 2.0  --   --   --   --   PHOS  --  3.8  --  3.6  --  3.1  --   --   --   --    < > = values in this interval not displayed.   Estimated Creatinine Clearance: 76.3 mL/min (by C-G  formula based on SCr of 0.7 mg/dL).   The patient is critically ill with multiple organ systems failure and requires high complexity decision making for assessment and support, frequent evaluation and titration of therapies, application of advanced monitoring technologies and  extensive interpretation of multiple databases. Critical Care Time devoted to patient care services described in this note is 35 minutes.   Christinia Gully, MD Pulmonary and [place] [phone]   After 7:00 pm call Elink  [phone]

## 2021-08-23 LAB — BASIC METABOLIC PANEL
Anion gap: 7 (ref 5–15)
BUN: 24 mg/dL — ABNORMAL HIGH (ref 8–23)
CO2: 36 mmol/L — ABNORMAL HIGH (ref 22–32)
Calcium: 8.4 mg/dL — ABNORMAL LOW (ref 8.9–10.3)
Chloride: 95 mmol/L — ABNORMAL LOW (ref 98–111)
Creatinine, Ser: 0.69 mg/dL (ref 0.44–1.00)
GFR, Estimated: >60 mL/min (ref >60)
Glucose, Bld: 166 mg/dL — ABNORMAL HIGH (ref 70–99)
Potassium: 3.9 mmol/L (ref 3.5–5.1)
Sodium: 138 mmol/L (ref 135–145)

## 2021-08-23 LAB — GLUCOSE, CAPILLARY
Glucose-Capillary: 118 mg/dL — ABNORMAL HIGH (ref 70–99)
Glucose-Capillary: 167 mg/dL — ABNORMAL HIGH (ref 70–99)
Glucose-Capillary: 168 mg/dL — ABNORMAL HIGH (ref 70–99)
Glucose-Capillary: 172 mg/dL — ABNORMAL HIGH (ref 70–99)
Glucose-Capillary: 184 mg/dL — ABNORMAL HIGH (ref 70–99)
Glucose-Capillary: 92 mg/dL (ref 70–99)

## 2021-08-23 NOTE — Progress Notes (Addendum)
NAME:  [name], MRN:  [id number], DOB:  [birth year], LOS: 8 ADMISSION DATE:  08/14/2021, CONSULTATION DATE: August 15, 2021 REFERRING MD: ED, CHIEF COMPLAINT: Leg pain and swelling  BRIEF  [name] with past medical history of COPD on 2 L of oxygen patient is with GERD chronic lower extremity venous insufficiency edema wound was admitted to the hospital 2 months PTA with what seems to be chronic ulcer she was supposed to follow-up with wound care and go to rehab but she ended up going to her home never followed up in the wound In the ED   patient was found also to have bedbugs She was given 3 L of fluid continue to be hypotensive required low-dose Levophed at 6 mics per minute. Patient had no fever chills rigors no nausea vomiting diarrhea no chest pain no abdominal pain she has significant pain in the back of her right thigh with a swelling of her right lower extremity   PMH  COPD HLD Gout  EVENTS   08/15/2021 -  repeat rounds: History review shows admission 06/16/2021 - 06/23/2021 with history of COPD on 2 L, depression on Wellbutrin and venlafaxine and anxiety on alprazolam, acid reflux hypothyroidism chronic lower extremity wounds [follows with Dr. Duda] hyperlipidemia, failure to thrive self-neglect with evidence of home infestation by bedbugs and fleas in November 2022, limited mobility using walker, that admission was for sepsis secondary to bilateral lower extremity cellulitis.  And stage II sacral decub.  She consistently refused dressing changes.  Patient discharged to [facility] to recent [place] home for antibiotics and dressing changes but subsequently went back to her house which according to the patient because she was ambulatory.  However according to the cousin patient has been with a cousin all along and has refused to participate in care and also has been having failure to thrive with diminished eating.  The cousin is asking for placement.  The cousin  is supportive of her DNR and DNI  -1/15 Levophed 9 mcg with good blood pressure.  She is awake and interactive.  She has significant pain in her lower extremities.  RN describes significant right thigh posterior ulcer and right foot ulcer and sacral decub all present on admission. 1/16- WOC consult, GS consult at their recommendation-- no surgical intervention needed.  Scheduled Meds:  aspirin  81 mg Oral Daily   buPROPion  150 mg Oral QPM   Chlorhexidine Gluconate Cloth  6 each Topical Daily   collagenase   Topical Daily   enoxaparin (LOVENOX) injection  40 mg Subcutaneous Q24H   feeding supplement  237 mL Oral TID BM   fludrocortisone  0.1 mg Oral Daily   gabapentin  300 mg Oral TID   hydrocortisone sod succinate (SOLU-CORTEF) inj  100 mg Intravenous Q12H   influenza vac split quadrivalent PF  0.5 mL Intramuscular Tomorrow-1000   insulin aspart  0-9 Units Subcutaneous Q4H   insulin detemir  10 Units Subcutaneous Daily   ipratropium-albuterol  3 mL Nebulization BID   levothyroxine  150 mcg Oral Q0600   mouth rinse  15 mL Mouth Rinse BID   midodrine  15 mg Oral TID WC   nutrition supplement (JUVEN)  1 packet Oral BID BM   ondansetron (ZOFRAN) IV  4 mg Intravenous Q6H   pantoprazole  40 mg Oral Daily   polyethylene glycol  17 g Oral Daily   senna  1 tablet Oral Daily   sodium chloride flush  10-40 mL Intracatheter  Q12H   sodium hypochlorite   Topical BID   venlafaxine XR  150 mg Oral QHS   Continuous Infusions:  sodium chloride Stopped (08/17/21 1512)   norepinephrine (LEVOPHED) Adult infusion 1 mcg/min (08/22/21 2243)   piperacillin-tazobactam (ZOSYN)  IV Stopped (08/23/21 0400)   vancomycin Stopped (08/22/21 2345)   PRN Meds:.ALPRAZolam, docusate sodium, fentaNYL (SUBLIMAZE) injection, magic mouthwash w/lidocaine, oxyCODONE, polyethylene glycol, prochlorperazine, sodium chloride flush   SUBJECTIVE/OVERNIGHT/INTERVAL HX  Still on low dose levophed wit I/0s continuing to be neg  and did not not get fluid bolus prior to restarting levophed  Objective   Blood pressure (!) 105/52, pulse 60, temperature 98.3 F (36.8 C), temperature source Axillary, resp. rate 11, height 5\' 4"  (1.626 m), weight 90.2 kg, SpO2 98 %.        Intake/Output Summary (Last 24 hours) at 08/23/2021 0545 Last data filed at 08/22/2021 2243 Gross per 24 hour  Intake 645.92 ml  Output 1500 ml  Net -854.08 ml   Filed Weights   08/20/21 0355 08/21/21 0648 08/22/21 0631  Weight: 87.6 kg 89.2 kg 90.2 kg   Tmax   99 General appearance:    slowed mentation, hard of hearing chronically ill appearing   At Rest 02 sats  100% on 2lpm   No jvd Oropharynx clear,  mucosa nl Neck supple Lungs with min rhonchi bilaterally RRR no s3 or or sign murmur note pulse only 60 despite low bp Abd soft/ nl  excursion / appetite and food intake ok per nursing  Extr warm with wounds/ dressings R LE  Neuro  Sensorium intact but slowed ,  no apparent motor deficits   Assessment & Plan:   Septic shock due to cellulitis RLE though note PCT was nl this admit and I/0s continue to be neg despite effors to volume expand - con't  zosyn/vanc per pharmacy dosing - Continue midodrine 15 mg 3 times daily - Continue stress dose steroids-hydrocortisone and fludrocortisone. >>> accept mean BP 55 as long as mentation and uop ok and give fluid bolus prn   Acute respiratory failure with hypoxia and ? Component hypercarbia COPD -  still 02 dep at 2lpm am 1/22 but probably can wean >> Continue DuoNebs twice daily, can start LAMA at discharge >> Pulmonary hygiene, out of bed when able   Decubitus ulcers-- R foot, R thigh, sacrum -Surgery / WOC have seen  >>  oxycodone for pain control, fentanyl PRN for dressing changes.  Anxiety Chronic opiate use for chronic LE pain >>>  Continue Wellbutrin, Effexor, gabapentin.  Has Xanax as needed-all home meds  Mild hyponatremia, resolved - Avoid excessive free water  GERD -Continue  PPI, has been an outpatient medication   Anemia of chronic disease- acute on chronic    Lab Results  Component Value Date   HGB 8.4 (L) 08/22/2021   HGB 9.0 (L) 08/19/2021   HGB 8.9 (L) 08/19/2021    -Transfuse for hemoglobin less than 7 or hemodynamically significant bleeding - Monitor periodically, trying to avoid over phlebotomizing her >>>  recehck am 2/23   Reactive thrombocytosis due to sepsis Lab Results  Component Value Date   PLT 526 (H) 08/22/2021   PLT 781 (H) 08/19/2021   PLT 684 (H) 08/19/2021    -DVT prophylaxis   Prediabetes, hyperglycemia - Increase Levmir to 10 units daily noting neg I/0s may represent some element of ongoing osmotic diuresis  - Sliding scale insulin as needed - Goal BG 140 to lower renal threshold for osm  diuresis     Troponin elevation, likely 2/2 sepsis. Echo with normal EF and no RWMA, so unlikely ischemic disease -no additional monitoring  Hypothyroidism Lab Results  Component Value Date   TSH 3.003 08/17/2021    -Continue Synthroid  Failure to thrive   Self-neglect with poor motivation Immobility using walker  -PT, OT, out of bed mobility - Doing well with Ensure supplements and eating well   Severe protein calorie malnutrition  cachexia - Encourage p.o. intake  Poor social situation with bedbugs and fleas in the house > have not seen any since she has been here. Noncompliance with medical instructions -Need social work assistance with placement  Patient updated at bedside today.  Best practice:  Diet: Carb modified Pain/Anxiety/Delirium protocol (if indicated): VAP protocol (if indicated):  DVT prophylaxis: Lovenox GI prophylaxis: PPI Glucose control: SSI, levemir Mobility: ambulate with assistance   Code Status: DNR (08/15/2021 at admission: Cousin is in favor of DNR.  Also spoke independently to the patient.  Explained to the patient that we are trying to get her better but in the process if her heart stops or  her breathing stops then natural death has occurred.  Expressed to the patient that in the event this was to happen would be recognized this is natural death or try to intervene in terms of intubation or CPR.  Expressed to the patient that given her medical conditions probably not in her best interest to go through CPR and intubation.  Patient states she was aligned with this and confirmed DNR/DNI.  Communicated this with nurse Ebony Hail ). Family Communication: Called [name] relative [phone] -> cousin reports that she is very difficult and challenging to take care of.   Cousin is requesting placement in a nursing home.  Cousin is in favor of DNR  Disposition: ICU until off pressors    LABS    PULMONARY No results for input(s): PHART, PCO2ART, PO2ART, HCO3, TCO2, O2SAT in the last 168 hours.  Invalid input(s): PCO2, PO2   CBC Recent Labs  Lab 08/19/21 0522 08/19/21 2015 08/22/21 0423  HGB 8.9* 9.0* 8.4*  HCT 30.6* 31.3* 30.0*  WBC 13.9* 14.3* 12.9*  PLT 684* 781* 526*    COAGULATION No results for input(s): INR in the last 168 hours.  CARDIAC  No results for input(s): TROPONINI in the last 168 hours. No results for input(s): PROBNP in the last 168 hours.   CHEMISTRY Recent Labs  Lab 08/17/21 0422 08/18/21 0435 08/19/21 0522 08/19/21 2015 08/20/21 0350 08/21/21 0632 08/22/21 0423  NA 134* 134* 133* 135  --  137 139  K 4.6 4.0 4.3 4.4  --  4.3 4.3  CL 100 98 96* 95*  --  97* 96*  CO2 26 31 32 32  --  35* 36*  GLUCOSE 184* 136* 173* 167*  --  170* 179*  BUN 8 9 14 18   --  22 22  CREATININE 0.76 0.60 0.83 0.83 0.71 0.72   0.62 0.70  CALCIUM 8.6* 8.2* 8.4* 8.4*  --  8.1* 8.3*  MG 1.7 2.3 2.0  --   --   --   --   PHOS 3.6  --  3.1  --   --   --   --    Estimated Creatinine Clearance: 76.3 mL/min (by C-G formula based on SCr of 0.7 mg/dL).  The patient is critically ill with multiple organ systems failure and requires high complexity decision making for  assessment and support, frequent evaluation  and titration of therapies, application of advanced monitoring technologies and extensive interpretation of multiple databases. Critical Care Time devoted to patient care services described in this note is 35 minutes.   Christinia Gully, MD Pulmonary and [place] Cell [phone]   After 7:00 pm call Elink  [phone]      Christinia Gully, MD Pulmonary and [place] [phone]   After 7:00 pm call Elink  [phone]

## 2021-08-24 DIAGNOSIS — J9601 Acute respiratory failure with hypoxia: Secondary | ICD-10-CM

## 2021-08-24 LAB — GLUCOSE, CAPILLARY
Glucose-Capillary: 152 mg/dL — ABNORMAL HIGH (ref 70–99)
Glucose-Capillary: 112 mg/dL — ABNORMAL HIGH (ref 70–99)
Glucose-Capillary: 157 mg/dL — ABNORMAL HIGH (ref 70–99)
Glucose-Capillary: 135 mg/dL — ABNORMAL HIGH (ref 70–99)
Glucose-Capillary: 180 mg/dL — ABNORMAL HIGH (ref 70–99)
Glucose-Capillary: 147 mg/dL — ABNORMAL HIGH (ref 70–99)

## 2021-08-24 LAB — BASIC METABOLIC PANEL
Anion gap: 4 — ABNORMAL LOW (ref 5–15)
BUN: 28 mg/dL — ABNORMAL HIGH (ref 8–23)
CO2: 38 mmol/L — ABNORMAL HIGH (ref 22–32)
Calcium: 8.4 mg/dL — ABNORMAL LOW (ref 8.9–10.3)
Chloride: 94 mmol/L — ABNORMAL LOW (ref 98–111)
Creatinine, Ser: 0.7 mg/dL (ref 0.44–1.00)
GFR, Estimated: >60 mL/min (ref >60)
Glucose, Bld: 157 mg/dL — ABNORMAL HIGH (ref 70–99)
Potassium: 3.9 mmol/L (ref 3.5–5.1)
Sodium: 136 mmol/L (ref 135–145)

## 2021-08-24 LAB — CBC
HCT: 28.1 % — ABNORMAL LOW (ref 36.0–46.0)
Hemoglobin: 8.1 g/dL — ABNORMAL LOW (ref 12.0–15.0)
MCH: 25.7 pg — ABNORMAL LOW (ref 26.0–34.0)
MCHC: 28.8 g/dL — ABNORMAL LOW (ref 30.0–36.0)
MCV: 89.2 fL (ref 80.0–100.0)
Platelets: 445 10*3/uL — ABNORMAL HIGH (ref 150–400)
RBC: 3.15 MIL/uL — ABNORMAL LOW (ref 3.87–5.11)
RDW: 23.6 % — ABNORMAL HIGH (ref 11.5–15.5)
WBC: 10.4 10*3/uL (ref 4.0–10.5)
nRBC: 0.2 % (ref 0.0–0.2)

## 2021-08-24 MED ORDER — INSULIN ASPART 100 UNIT/ML IJ SOLN
0.0000 [IU] | Freq: Three times a day (TID) | INTRAMUSCULAR | Status: DC
  Administered 2021-08-27 – 2021-08-28 (×2): 2 [IU] via SUBCUTANEOUS
  Administered 2021-08-28: 1 [IU] via SUBCUTANEOUS
  Administered 2021-08-28: 2 [IU] via SUBCUTANEOUS
  Administered 2021-08-29 – 2021-08-30 (×4): 1 [IU] via SUBCUTANEOUS

## 2021-08-24 MED ORDER — INSULIN DETEMIR 100 UNIT/ML ~~LOC~~ SOLN
5.0000 [IU] | Freq: Every day | SUBCUTANEOUS | Status: DC
  Administered 2021-08-25: 10:00:00 5 [IU] via SUBCUTANEOUS
  Filled 2021-08-24: qty 0.05

## 2021-08-24 MED ORDER — HYDROCORTISONE SOD SUC (PF) 100 MG IJ SOLR
100.0000 mg | Freq: Every day | INTRAMUSCULAR | Status: AC
  Administered 2021-08-24: 100 mg via INTRAVENOUS
  Filled 2021-08-24: qty 2

## 2021-08-24 NOTE — Consult Note (Signed)
[facility]       Reason for Consult: wound and cellulitis    Referring Physician: Dr. Judeth Horn  Principal Problem:   Sepsis [facility]) Active Problems:   Septic shock (HCC)   DNR (do not resuscitate)   Pressure injury of skin    aspirin  81 mg Oral Daily   buPROPion  150 mg Oral QPM   Chlorhexidine Gluconate Cloth  6 each Topical Daily   collagenase   Topical Daily   enoxaparin (LOVENOX) injection  40 mg Subcutaneous Q24H   feeding supplement  237 mL Oral TID BM   fludrocortisone  0.1 mg Oral Daily   gabapentin  300 mg Oral TID   influenza vac split quadrivalent PF  0.5 mL Intramuscular Tomorrow-1000   insulin aspart  0-9 Units Subcutaneous Q4H   [START ON 08/25/2021] insulin detemir  5 Units Subcutaneous Daily   ipratropium-albuterol  3 mL Nebulization BID   levothyroxine  150 mcg Oral Q0600   mouth rinse  15 mL Mouth Rinse BID   midodrine  15 mg Oral TID WC   nutrition supplement (JUVEN)  1 packet Oral BID BM   ondansetron (ZOFRAN) IV  4 mg Intravenous Q6H   pantoprazole  40 mg Oral Daily   polyethylene glycol  17 g Oral Daily   senna  1 tablet Oral Daily   sodium chloride flush  10-40 mL Intracatheter Q12H   venlafaxine XR  150 mg Oral QHS    Recommendations: Stop vancomycin  Assessment: She has an open wound of her right posterior thigh and ischial area that is chronic and lower extremity wound of the right heel which also is chronic.  On surgery evaluation, does not probe to bone.  Regardless, she would need extensive nutritional support, offloading and wound care to consider addressing these wounds if deep infection was of concern.  Therefore no prolonged antibiotics indicated at this time.   Additionally, no current cellulitis.  Some venous status noted.   Now 9 days on antibiotics.   Will stop vancomycin now and if she remains off pressor support tomorrow, will stop piperacillin/tazobactam as well.    Antibiotics: Vancomycin and  piptazo day 8 Total antibiotics day 9  HPI: [name] is a 66 y.o. female with a history of COPD on chronic oxygen admitted 08/15/21 with septic shock.  She was previously admitted in November for a similar presentation and noted bed bugs in her home and on her and apparently was going to a relatives home and never went to wound care.  On admission she required pressor support with levophed, which was weaned off yesterday and leukocytosis of 15.5 and started on vancomycin and piperacillin/tazobactam. She has been on hydrocortisone.     Review of Systems:  Constitutional: negative for fevers and chills Gastrointestinal: negative for nausea and diarrhea All other systems reviewed and are negative    Past Medical History:  Diagnosis Date   COPD (chronic obstructive pulmonary disease) (HCC)    Gout    Hypercholesteremia     Social History   Tobacco Use   Smoking status: Every Day    Packs/day: 1.00    Years: 30.00    Pack years: 30.00    Types: Cigarettes   Smokeless tobacco: Never  Substance Use Topics   Alcohol use: No    Alcohol/week: 0.0 standard drinks   Drug use: No    History reviewed. No pertinent family history.  Allergies  Allergen Reactions  Gemfibrozil     Other reaction(s): Sick to stomach   Nitroglycerin     Other reaction(s): Cough, Irritation   Other Other (See Comments)    Grape fruit juice - unknown   Pravastatin     Other reaction(s): Dizzy   Sulfamethoxazole-Trimethoprim     Other reaction(s): N/V   Tyloxapol     Other reaction(s): Does not work    Physical Exam: Constitutional: in no apparent distress  Vitals:   08/24/21 0800 08/24/21 0815  BP: 105/73   Pulse:  84  Resp: (!) 9 15  Temp:    SpO2:  99%   EYES: anicteric Cardiovascular: Cor RRR Respiratory: CTA b; normal respiratory effort Musculoskeletal: left leg with erythema, minimal warmth; right leg with erythema, no warmth Neuro: non-focal  Lab Results  Component Value Date    WBC 10.4 08/24/2021   HGB 8.1 (L) 08/24/2021   HCT 28.1 (L) 08/24/2021   MCV 89.2 08/24/2021   PLT 445 (H) 08/24/2021    Lab Results  Component Value Date   CREATININE 0.70 08/24/2021   BUN 28 (H) 08/24/2021   NA 136 08/24/2021   K 3.9 08/24/2021   CL 94 (L) 08/24/2021   CO2 38 (H) 08/24/2021    Lab Results  Component Value Date   ALT 12 08/19/2021   AST 14 (L) 08/19/2021   ALKPHOS 64 08/19/2021     Microbiology: Recent Results (from the past 240 hour(s))  Blood culture (routine x 2)     Status: None   Collection Time: 08/15/21 12:07 AM   Specimen: BLOOD  Result Value Ref Range Status   Specimen Description   Final    BLOOD BLOOD LEFT FOREARM Performed at [facility], [address], [place], [place] [address]    Special Requests   Final    BOTTLES DRAWN AEROBIC AND ANAEROBIC Blood Culture adequate volume Performed at [facility], [address] [place], [place] [address]    Culture   Final    NO GROWTH 5 DAYS Performed at [facility], [address], [place], [place] [address]    Report Status 08/20/2021 FINAL  Final  Blood culture (routine x 2)     Status: None   Collection Time: 08/15/21 12:07 AM   Specimen: BLOOD  Result Value Ref Range Status   Specimen Description   Final    BLOOD LEFT ANTECUBITAL Performed at [facility], [address], [place], [place] [address]    Special Requests   Final    BOTTLES DRAWN AEROBIC AND ANAEROBIC Blood Culture adequate volume Performed at [facility], [address], [place], [place] [address]    Culture   Final    NO GROWTH 5 DAYS Performed at [facility], [address], [place], [place] [address]    Report Status 08/20/2021 FINAL  Final  Resp Panel by RT-PCR (Flu A&B, Covid) Nasopharyngeal Swab     Status: None   Collection Time: 08/15/21 12:07 AM   Specimen: Nasopharyngeal Swab; Nasopharyngeal(NP) swabs in vial  transport medium  Result Value Ref Range Status   SARS Coronavirus 2 by RT PCR NEGATIVE NEGATIVE Final    Comment: (NOTE) SARS-CoV-2 target nucleic acids are NOT DETECTED.  The SARS-CoV-2 RNA is generally detectable in upper respiratory specimens during the acute phase of infection. The lowest concentration of SARS-CoV-2 viral copies this assay can detect is 138 copies/mL. A negative result does not preclude SARS-Cov-2  infection and should not be used as the sole basis for treatment or other patient management decisions. A negative result may occur with  improper specimen collection/handling, submission of specimen other than nasopharyngeal swab, presence of viral mutation(s) within the areas targeted by this assay, and inadequate number of viral copies(<138 copies/mL). A negative result must be combined with clinical observations, patient history, and epidemiological information. The expected result is Negative.  Fact Sheet for Patients:  BloggerCourse.comhttps://www.fda.gov/media/152166/download  Fact Sheet for Healthcare Providers:  SeriousBroker.ithttps://www.fda.gov/media/152162/download  This test is no t yet approved or cleared by the [place] FDA and  has been authorized for detection and/or diagnosis of SARS-CoV-2 by FDA under an Emergency Use Authorization (EUA). This EUA will remain  in effect (meaning this test can be used) for the duration of the COVID-19 declaration under Section 564(b)(1) of the Act, 21 U.S.C.section 360bbb-3(b)(1), unless the authorization is terminated  or revoked sooner.       Influenza A by PCR NEGATIVE NEGATIVE Final   Influenza B by PCR NEGATIVE NEGATIVE Final    Comment: (NOTE) The Xpert Xpress SARS-CoV-2/FLU/RSV plus assay is intended as an aid in the diagnosis of influenza from Nasopharyngeal swab specimens and should not be used as a sole basis for treatment. Nasal washings and aspirates are unacceptable for Xpert Xpress SARS-CoV-2/FLU/RSV testing.  Fact  Sheet for Patients: BloggerCourse.comhttps://www.fda.gov/media/152166/download  Fact Sheet for Healthcare Providers: SeriousBroker.ithttps://www.fda.gov/media/152162/download  This test is not yet approved or cleared by the [place] FDA and has been authorized for detection and/or diagnosis of SARS-CoV-2 by FDA under an Emergency Use Authorization (EUA). This EUA will remain in effect (meaning this test can be used) for the duration of the COVID-19 declaration under Section 564(b)(1) of the Act, 21 U.S.C. section 360bbb-3(b)(1), unless the authorization is terminated or revoked.  Performed at [facility], [address] [place], [place] [address]   MRSA Next Gen by PCR, Nasal     Status: Abnormal   Collection Time: 08/15/21  2:55 PM   Specimen: Nasal Mucosa; Nasal Swab  Result Value Ref Range Status   MRSA by PCR Next Gen DETECTED (A) NOT DETECTED Final    Comment: RESULT CALLED TO, READ BACK BY AND VERIFIED WITH: GARLAND,G. 08/15/21 @2143  BY SEEL,M. (NOTE) The GeneXpert MRSA Assay (FDA approved for NASAL specimens only), is one component of a comprehensive MRSA colonization surveillance program. It is not intended to diagnose MRSA infection nor to guide or monitor treatment for MRSA infections. Test performance is not FDA approved in patients less than 65 years old. Performed at [facility], [address] [place], [place] [address]     Gardiner Barefootobert W Zalia Hautala, MD [facility] [email or web address] 08/24/2021, 11:32 AM

## 2021-08-24 NOTE — Consult Note (Signed)
[place] Nurse wound follow up Wound type: Unstageable pressure injury; right posterior thigh with surrounding Stage 3 areas of breakdown MASD over the buttocks Full thickness ulceration right plantar surface and heel  Measurement:see nursing flowsheets Wound bed:see nursing flow sheets Drainage (amount, consistency, odor) moderate to heavy  Periwound:intact  Dressing procedure/placement/frequency: Updated wound care orders based on my partners notes from last week and [facility] notes.  Will need low air loss mattress if the patient transfers from the ICU; orders updated for this as well.   Re consult if needed, will not follow at this time. Thanks  Jamaica Inthavong [employer], RN,CWOCN, CNS, [place] [phone])

## 2021-08-24 NOTE — Progress Notes (Signed)
NAME:  [name], MRN:  [id number], DOB:  27-Jan-1956, LOS: 57 ADMISSION DATE:  08/14/2021, CONSULTATION DATE: August 15, 2021 REFERRING MD: ED, CHIEF COMPLAINT: Leg pain and swelling  BRIEF  [name] with past medical history of COPD on 2 L of oxygen patient is with GERD chronic lower extremity venous insufficiency edema wound was admitted to the hospital 2 months PTA with what seems to be chronic ulcer she was supposed to follow-up with wound care and go to rehab but she ended up going to her home never followed up in the wound In the ED   patient was found also to have bedbugs She was given 3 L of fluid continue to be hypotensive required low-dose Levophed at 6 mics per minute. Patient had no fever chills rigors no nausea vomiting diarrhea no chest pain no abdominal pain she has significant pain in the back of her right thigh with a swelling of her right lower extremity   PMH  COPD HLD Gout  EVENTS   08/15/2021 -  repeat rounds: History review shows admission 06/16/2021 - 06/23/2021 with history of COPD on 2 L, depression on Wellbutrin and venlafaxine and anxiety on alprazolam, acid reflux hypothyroidism chronic lower extremity wounds [follows with Dr. Duda] hyperlipidemia, failure to thrive self-neglect with evidence of home infestation by bedbugs and fleas in November 2022, limited mobility using walker, that admission was for sepsis secondary to bilateral lower extremity cellulitis.  And stage II sacral decub.  She consistently refused dressing changes.  Patient discharged to [facility] to recent [place] home for antibiotics and dressing changes but subsequently went back to her house which according to the patient because she was ambulatory.  However according to the cousin patient has been with a cousin all along and has refused to participate in care and also has been having failure to thrive with diminished eating.  The cousin is asking for placement.  The cousin  is supportive of her DNR and DNI  -1/15 Levophed 9 mcg with good blood pressure.  She is awake and interactive.  She has significant pain in her lower extremities.  RN describes significant right thigh posterior ulcer and right foot ulcer and sacral decub all present on admission. 1/16- WOC consult, GS consult at their recommendation-- no surgical intervention needed. 1/22 NE weaned off  Scheduled Meds:  aspirin  81 mg Oral Daily   buPROPion  150 mg Oral QPM   Chlorhexidine Gluconate Cloth  6 each Topical Daily   collagenase   Topical Daily   enoxaparin (LOVENOX) injection  40 mg Subcutaneous Q24H   feeding supplement  237 mL Oral TID BM   fludrocortisone  0.1 mg Oral Daily   gabapentin  300 mg Oral TID   hydrocortisone sod succinate (SOLU-CORTEF) inj  100 mg Intravenous Daily   influenza vac split quadrivalent PF  0.5 mL Intramuscular Tomorrow-1000   insulin aspart  0-9 Units Subcutaneous Q4H   insulin detemir  10 Units Subcutaneous Daily   ipratropium-albuterol  3 mL Nebulization BID   levothyroxine  150 mcg Oral Q0600   mouth rinse  15 mL Mouth Rinse BID   midodrine  15 mg Oral TID WC   nutrition supplement (JUVEN)  1 packet Oral BID BM   ondansetron (ZOFRAN) IV  4 mg Intravenous Q6H   pantoprazole  40 mg Oral Daily   polyethylene glycol  17 g Oral Daily   senna  1 tablet Oral Daily   sodium chloride flush  10-40 mL Intracatheter Q12H   venlafaxine XR  150 mg Oral QHS   Continuous Infusions:  sodium chloride Stopped (08/17/21 1512)   norepinephrine (LEVOPHED) Adult infusion Stopped (08/23/21 1000)   piperacillin-tazobactam (ZOSYN)  IV 12.5 mL/hr at 08/24/21 0826   vancomycin Stopped (08/23/21 2241)   PRN Meds:.ALPRAZolam, docusate sodium, fentaNYL (SUBLIMAZE) injection, magic mouthwash w/lidocaine, oxyCODONE, polyethylene glycol, prochlorperazine, sodium chloride flush   SUBJECTIVE/OVERNIGHT/INTERVAL HX  NAEON. Off NE. Objective   Blood pressure 105/73, pulse 84,  temperature 98.1 F (36.7 C), temperature source Oral, resp. rate 15, height 5\' 4"  (1.626 m), weight 90.2 kg, SpO2 99 %.        Intake/Output Summary (Last 24 hours) at 08/24/2021 0906 Last data filed at 08/24/2021 [id number] Gross per 24 hour  Intake 962.01 ml  Output 3050 ml  Net -2087.99 ml    Filed Weights   08/22/21 0631 08/23/21 0500 08/24/21 0454  Weight: 90.2 kg 90 kg 90.2 kg   General appearance:    slowed mentation, hard of hearing chronically ill appearing   Eyes: EOMI, icterus Neck: Supple, Pulmonary: Normal work of breathing, on nasal cannula Cardiovascular: Regular rate and rhythm   Assessment & Plan:   Septic shock due to cellulitis RLE though note PCT was nl this admit and I/0s continue to be neg despite effors to volume expand - con't  zosyn/vanc per pharmacy dosing, ID consult to help with appropriate antibiotic dosing and duration - Continue midodrine 15 mg 3 times daily -Wean stress dose steroids, continue fludrocortisone, anticipate stopping all 1/24 -- accept MAP 55 as long as mentation and uop ok and give fluid bolus prn   Acute respiratory failure with hypoxia and ? Component hypercarbia COPD --still 02 dep at 2lpm am 1/22 but probably can wean --Continue DuoNebs twice daily, can start LAMA at discharge --Pulmonary hygiene, out of bed when able   Decubitus ulcers-- R foot, R thigh, sacrum -Surgery / WOC have seen  --oxycodone for pain control, fentanyl PRN for dressing changes -- Infectious disease consult for evaluation of possible osteomyelitis, duration of antibiotic therapy  Anxiety Chronic opiate use for chronic LE pain --continue Wellbutrin, Effexor, gabapentin.  Has Xanax as needed-all home meds  Mild hyponatremia, resolved - Avoid excessive free water  GERD -Continue PPI, has been an outpatient medication   Anemia of chronic disease- acute on chronic  -Transfuse for hemoglobin less than 7 or hemodynamically significant bleeding - Monitor  periodically, trying to avoid over phlebotomizing her   Prediabetes, hyperglycemia - Decrease Levemir to 5 units daily as decreasing hydrocortisone to off  - Sliding scale insulin as needed - Goal BG 140 to lower renal threshold for osm diuresis   Hypothyroidism -Continue Synthroid  Failure to thrive   Self-neglect with poor motivation Immobility using walker  -PT, OT, out of bed mobility - Doing well with Ensure supplements and eating well   Severe protein calorie malnutrition  cachexia - Encourage p.o. intake  Poor social situation with bedbugs and fleas in the house > --have not seen any since she has been here. Noncompliance with medical instructions -Need social work assistance with placement    Best practice:  Diet: Carb modified Pain/Anxiety/Delirium protocol (if indicated): N/A VAP protocol (if indicated): N/A DVT prophylaxis: Lovenox GI prophylaxis: PPI Glucose control: SSI, levemir Mobility: ambulate with assistance   Code Status: DNR (08/15/2021 at admission: Cousin is in favor of DNR.  Also spoke independently to the patient.  Explained to the patient that we are trying  to get her better but in the process if her heart stops or her breathing stops then natural death has occurred.  Expressed to the patient that in the event this was to happen would be recognized this is natural death or try to intervene in terms of intubation or CPR.  Expressed to the patient that given her medical conditions probably not in her best interest to go through CPR and intubation.  Patient states she was aligned with this and confirmed DNR/DNI.  Communicated this with nurse Ebony Hail ). Family Communication: Called [name] relative [id number] -> cousin reports that she is very difficult and challenging to take care of.   Cousin is requesting placement in a nursing home.  Cousin is in favor of DNR  Disposition: Transfer out of ICU    LABS    PULMONARY No results for input(s):  PHART, PCO2ART, PO2ART, HCO3, TCO2, O2SAT in the last 168 hours.  Invalid input(s): PCO2, PO2   CBC Recent Labs  Lab 08/19/21 2015 08/22/21 0423 08/24/21 0404  HGB 9.0* 8.4* 8.1*  HCT 31.3* 30.0* 28.1*  WBC 14.3* 12.9* 10.4  PLT 781* 526* 445*     COAGULATION No results for input(s): INR in the last 168 hours.  CARDIAC  No results for input(s): TROPONINI in the last 168 hours. No results for input(s): PROBNP in the last 168 hours.   CHEMISTRY Recent Labs  Lab 08/18/21 0435 08/19/21 0522 08/19/21 2015 08/20/21 0350 08/21/21 0632 08/22/21 0423 08/23/21 0558 08/24/21 0404  NA 134* 133* 135  --  137 139 138 136  K 4.0 4.3 4.4  --  4.3 4.3 3.9 3.9  CL 98 96* 95*  --  97* 96* 95* 94*  CO2 31 32 32  --  35* 36* 36* 38*  GLUCOSE 136* 173* 167*  --  170* 179* 166* 157*  BUN 9 14 18   --  22 22 24* 28*  CREATININE 0.60 0.83 0.83 0.71 0.72   0.62 0.70 0.69 0.70  CALCIUM 8.2* 8.4* 8.4*  --  8.1* 8.3* 8.4* 8.4*  MG 2.3 2.0  --   --   --   --   --   --   PHOS  --  3.1  --   --   --   --   --   --     Estimated Creatinine Clearance: 76.3 mL/min (by C-G formula based on SCr of 0.7 mg/dL).  CRITICAL CARE N/a   Lanier Clam, MD See Amion for contact info After 7:00 pm call Elink  [phone]

## 2021-08-25 ENCOUNTER — Inpatient Hospital Stay (HOSPITAL_COMMUNITY): Payer: Medicaid Other

## 2021-08-25 DIAGNOSIS — E039 Hypothyroidism, unspecified: Secondary | ICD-10-CM

## 2021-08-25 DIAGNOSIS — R6521 Severe sepsis with septic shock: Secondary | ICD-10-CM | POA: Diagnosis not present

## 2021-08-25 DIAGNOSIS — J9601 Acute respiratory failure with hypoxia: Secondary | ICD-10-CM | POA: Diagnosis not present

## 2021-08-25 DIAGNOSIS — F419 Anxiety disorder, unspecified: Secondary | ICD-10-CM

## 2021-08-25 DIAGNOSIS — A419 Sepsis, unspecified organism: Secondary | ICD-10-CM | POA: Diagnosis not present

## 2021-08-25 DIAGNOSIS — J449 Chronic obstructive pulmonary disease, unspecified: Secondary | ICD-10-CM

## 2021-08-25 LAB — GLUCOSE, CAPILLARY
Glucose-Capillary: 127 mg/dL — ABNORMAL HIGH (ref 70–99)
Glucose-Capillary: 51 mg/dL — ABNORMAL LOW (ref 70–99)
Glucose-Capillary: 60 mg/dL — ABNORMAL LOW (ref 70–99)
Glucose-Capillary: 63 mg/dL — ABNORMAL LOW (ref 70–99)
Glucose-Capillary: 65 mg/dL — ABNORMAL LOW (ref 70–99)
Glucose-Capillary: 67 mg/dL — ABNORMAL LOW (ref 70–99)
Glucose-Capillary: 68 mg/dL — ABNORMAL LOW (ref 70–99)
Glucose-Capillary: 83 mg/dL (ref 70–99)
Glucose-Capillary: 85 mg/dL (ref 70–99)

## 2021-08-25 LAB — GLUCOSE, RANDOM: Glucose, Bld: 76 mg/dL (ref 70–99)

## 2021-08-25 MED ORDER — POLYETHYLENE GLYCOL 3350 17 G PO PACK
17.0000 g | PACK | Freq: Two times a day (BID) | ORAL | Status: DC
  Administered 2021-08-28 – 2021-09-02 (×9): 17 g via ORAL
  Filled 2021-08-25 (×16): qty 1

## 2021-08-25 NOTE — Progress Notes (Signed)
Occupational Therapy Treatment  Patient very cooperative and participatory in session. Mod A for trunk support to sit upright edge of bed. Patient mod x2 to power up to standing from bed and use of stedy to transfer to bedside commode. Patient heavily reliant on UE support of stedy, needing total A for perianal care after bowel movement and voiding. Patient able to stand for ~8 minutes while RN assist with peri care and dressing changes. Patient progressing towards acute OT goals, tolerating greater out of bed activity ending session sitting up to recliner chair.     08/25/21 1400  OT Visit Information  Last OT Received On 08/25/21  Assistance Needed +2  History of Present Illness [name] admitted 08/14/2021 with worsening draining right posterior thigh wound and right foot wounds. PMH of COPD on 2 L of oxygen, gout, GERD,chronic lower extremity venous insufficiency.  Precautions  Precautions Fall  Precaution Comments right posterior thigh wound, right foot(plantar heel), buttock wounds  Pain Assessment  Pain Assessment Faces  Faces Pain Scale 4  Pain Location R thigh with dressing change  Pain Descriptors / Indicators Grimacing  Pain Intervention(s) Monitored during session  Cognition  Arousal/Alertness Awake/alert  Behavior During Therapy WFL for tasks assessed/performed  Overall Cognitive Status No family/caregiver present to determine baseline cognitive functioning  General Comments Patient very pleasant and cooperative throughout session. Asking about stedy and how much it costs "I need one of those" however unsure of discharge plan/who would be assisting using this per patient.  ADL  Overall ADL's  Needs assistance/impaired  Lower Body Dressing Total assistance;Bed level  Lower Body Dressing Details (indicate cue type and reason) don socks  Toilet Transfer Moderate assistance;Total assistance;+2 for physical assistance;+2 for safety/equipment;BSC/3in1   Toilet Transfer Details (indicate cue type and reason) Patient mod A x2 to power up to standing from edge of bed, Total A to complete transfer to [facility] using stedy.  Toileting- Clothing Manipulation and Hygiene Total assistance;Sit to/from stand  Toileting - Clothing Manipulation Details (indicate cue type and reason) Patient tolerate standing for ~8 minutes for perianal care and for RN to replace soiled dressings with min A +1-2 as arms fatigue holding onto stedy  Bed Mobility  Overal bed mobility Needs Assistance  Bed Mobility Supine to Sit  Supine to sit Mod assist  General bed mobility comments Able to bring legs to edge of bed, mod A to upright trunk  Transfers  Overall transfer level Needs assistance  Equipment used  (Stedy)  Transfers Sit to/from Stand;Bed to chair/wheelchair/BSC  Sit to Stand Mod assist;+2 physical assistance;+2 safety/equipment  General transfer comment PLease see toileting section  Balance  Overall balance assessment History of Falls;Needs assistance  Sitting-balance support Feet unsupported  Sitting balance-Leahy Scale Fair  Standing balance support Reliant on assistive device for balance  Standing balance-Leahy Scale Poor  OT - End of Session  Equipment Utilized During Treatment Other (comment) (stedy)  Activity Tolerance Patient tolerated treatment well  Patient left in chair;with call bell/phone within reach;with nursing/sitter in room  Nurse Communication Mobility status;Other (comment) (RN present for session)  OT Assessment/Plan  OT Plan Discharge plan remains appropriate  OT Visit Diagnosis Other abnormalities of gait and mobility (R26.89);History of falling (Z91.81);Muscle weakness (generalized) (M62.81)  OT Frequency (ACUTE ONLY) Min 2X/week  Follow Up Recommendations Skilled nursing-short term rehab (<3 hours/day)  Assistance recommended at discharge Frequent or constant Supervision/Assistance  Patient can return home with the following Two  people to help with walking and/or  transfers;Two people to help with bathing/dressing/bathroom;Assistance with cooking/housework;Assist for transportation;Help with stairs or ramp for entrance;Direct supervision/assist for financial management  AM-PAC OT "6 Clicks" Daily Activity Outcome Measure (Version 2)  Help from another person eating meals? 3  Help from another person taking care of personal grooming? 3  Help from another person toileting, which includes using toliet, bedpan, or urinal? 1  Help from another person bathing (including washing, rinsing, drying)? 2  Help from another person to put on and taking off regular upper body clothing? 3  Help from another person to put on and taking off regular lower body clothing? 1  6 Click Score 13  Progressive Mobility  What is the highest level of mobility based on the progressive mobility assessment? Level 3 (Stands with assist) - Balance while standing  and cannot march in place  Activity Transferred from bed to chair;Transferred to/from [facility]  OT Goal Progression  Progress towards OT goals Progressing toward goals  Acute Rehab OT Goals  Patient Stated Goal Use bathroom  OT Goal Formulation With patient  Time For Goal Achievement 09/01/21  Potential to Achieve Goals Fair  ADL Goals  Pt Will Perform Lower Body Dressing sitting/lateral leans;with adaptive equipment;with mod assist  Pt/caregiver will Perform Home Exercise Program Increased strength;Both right and left upper extremity;Independently  Additional ADL Goal #1 Patient will tolerate 15 minutes of OOB activity in order to participate in self care tasks and transfer training.  OT Time Calculation  OT Start Time (ACUTE ONLY) 1305  OT Stop Time (ACUTE ONLY) 1344  OT Time Calculation (min) 39 min  OT General Charges  $OT Visit 1 Visit  OT Treatments  $Self Care/Home Management  38-52 mins   [name] OT OT pager: [phone]

## 2021-08-25 NOTE — Progress Notes (Signed)
PROGRESS NOTE    [name]  ZHG:[id number] DOB: 09-20-1955 DOA: 08/14/2021 PCP: Rodrigo Ran, MD   Brief Narrative: [name] is a 66 y.o. female with a history of COPD on 2 L oxygen, GERD, chronic pain, anemia. Patient was admitted for septic shock from leg wound. She required vasopressor support. Blood cultures with no growth. ID consulted for help with antibiotic management. Vasopressor support weaned off. Antibiotics discontinued.  Assessment & Plan:   Septic shock Secondary to RLE cellulitis. Patient required vasopressor support, stress dose steroids and midodrine. Vasopressors weaned off and stress dose steroids being weaned.  RLE cellulitis Patient empirically started on Vancomycin and Zosyn. ID consulted with current recommendations to discontinue antibiotics. Wound care consulted  Hypotension Persistent. In setting of septic shock but has remained persistent after treatment of infection. PCCM recommending map goal of >55. Patient is currently managed on midodrine and is weaning off Florinef -Continue midodrine -IV fluid bolus as needed  Acute on chronic respiratory failure with hypoxia Currently at baseline oxygen  COPD Stable -Continue Duonebs  Anxiety -Continue Wellbutrin, Effexor and gabapentin -Continue Xanax  Hypothyroidism -Continue Synthroid  Chronic opiate use -Continue oxycodone prn  Hyponatremia Mild. Asymptomatic. Resolved.  GERD -Continue Protonix  Anemia of chronic disease Stable. -Iron panel  Hyperglycemia Hemoglobin A1C of 5.6; does not meet criteria for prediabetes. Currently hypoglycemic -Discontinue Levemir  Failure to thrive Severe malnutrition Dietitian recommendations (1/17): will order Ensure Enlive BID, each supplement provides 350 kcal and 20 grams of protein  will order 1 packet Juven BID, each packet provides 95 calories, 2.5 grams of protein (collagen), and 9.8 grams of carbohydrate (3 grams sugar); also contains  7 grams of L-arginine and L-glutamine, 300 mg vitamin C, 15 mg vitamin E, 1.2 mcg vitamin B-12, 9.5 mg zinc, 200 mg calcium, and 1.5 g  Calcium Beta-hydroxy-Beta-methylbutyrate to support wound healing.  will follow-up 1/18 for day #2 Calorie Count results.  Poor social situation Bedbugs and fleas reported in the house  Pressure injury Left/medial buttock, right/posterior buttock, posterior thigh. Present on admission.    DVT prophylaxis: Lovenox Code Status:   Code Status: DNR Family Communication: None at bedside Disposition Plan: Discharge home vs SNF in several days pending stable blood pressure   Consultants:  PCCM Infectious disease  Procedures:  TRANSTHORACIC ECHOCARDIOGRAM (08/15/2021) IMPRESSIONS     1. Left ventricular ejection fraction, by estimation, is 50 to 55%. The  left ventricle has low normal function. The left ventricle has no regional  wall motion abnormalities. Left ventricular diastolic parameters are  consistent with Grade I diastolic  dysfunction (impaired relaxation).   2. Right ventricular systolic function is normal. The right ventricular  size is normal. There is mildly elevated pulmonary artery systolic  pressure. The estimated right ventricular systolic pressure is 36.9 mmHg.   3. The mitral valve is normal in structure. No evidence of mitral valve  regurgitation.   4. The aortic valve was not well visualized. Aortic valve regurgitation  is not visualized. No aortic stenosis is present.   5. The inferior vena cava is dilated in size with >50% respiratory  variability, suggesting right atrial pressure of 8 mmHg.   Antimicrobials: Vancomycin Zosyn  Cefepime Flagyl   Subjective: Patient reports some right sided abdominal pain. No other concerns.  Objective: Vitals:   08/25/21 0500 08/25/21 0604 08/25/21 0754 08/25/21 0757  BP: (!) 93/51 (!) 87/42    Pulse: (!) 57 (!) 110    Resp: 12 (!) 21  Temp:   98.1 F (36.7 C)   TempSrc:    Oral   SpO2: 100% 97%  100%  Weight:      Height:        Intake/Output Summary (Last 24 hours) at 08/25/2021 0907 Last data filed at 08/25/2021 [id number] Gross per 24 hour  Intake 1378.53 ml  Output 2302 ml  Net -923.47 ml   Filed Weights   08/22/21 0631 08/23/21 0500 08/24/21 0454  Weight: 90.2 kg 90 kg 90.2 kg    Examination:  General exam: Appears calm and comfortable Respiratory system: Some rales but no wheezing. diminished. Respiratory effort normal. Cardiovascular system: S1 & S2 heard, RRR. No murmurs, rubs, gallops or clicks. Gastrointestinal system: Abdomen is nondistended, soft and minimally tender on right side. No organomegaly or masses felt. Normal bowel sounds heard. Central nervous system: Alert and oriented. No focal neurological deficits. Musculoskeletal: No calf tenderness Skin: No cyanosis. Psychiatry: Judgement and insight appear normal. Mood & affect appropriate.     Data Reviewed: I have personally reviewed following labs and imaging studies  CBC Lab Results  Component Value Date   WBC 10.4 08/24/2021   RBC 3.15 (L) 08/24/2021   HGB 8.1 (L) 08/24/2021   HCT 28.1 (L) 08/24/2021   MCV 89.2 08/24/2021   MCH 25.7 (L) 08/24/2021   PLT 445 (H) 08/24/2021   MCHC 28.8 (L) 08/24/2021   RDW 23.6 (H) 08/24/2021   LYMPHSABS 1.8 08/19/2021   MONOABS 0.5 08/19/2021   EOSABS 0.0 08/19/2021   BASOSABS 0.0 [id number]     Last metabolic panel Lab Results  Component Value Date   NA 136 08/24/2021   K 3.9 08/24/2021   CL 94 (L) 08/24/2021   CO2 38 (H) 08/24/2021   BUN 28 (H) 08/24/2021   CREATININE 0.70 08/24/2021   GLUCOSE 157 (H) 08/24/2021   GFRNONAA >60 08/24/2021   GFRAA >60 04/06/2018   CALCIUM 8.4 (L) 08/24/2021   PHOS 3.1 08/19/2021   PROT 6.0 (L) 08/19/2021   ALBUMIN 2.2 (L) 08/19/2021   BILITOT 0.4 08/19/2021   ALKPHOS 64 08/19/2021   AST 14 (L) 08/19/2021   ALT 12 08/19/2021   ANIONGAP 4 (L) 08/24/2021    CBG (last 3)  Recent Labs     08/25/21 0742 08/25/21 0743 08/25/21 0853  GLUCAP 65* 68* 85     GFR: Estimated Creatinine Clearance: 76.3 mL/min (by C-G formula based on SCr of 0.7 mg/dL).  Coagulation Profile: No results for input(s): INR, PROTIME in the last 168 hours.  Recent Results (from the past 240 hour(s))  MRSA Next Gen by PCR, Nasal     Status: Abnormal   Collection Time: 08/15/21  2:55 PM   Specimen: Nasal Mucosa; Nasal Swab  Result Value Ref Range Status   MRSA by PCR Next Gen DETECTED (A) NOT DETECTED Final    Comment: RESULT CALLED TO, READ BACK BY AND VERIFIED WITH: GARLAND,G. 08/15/21 @2143  BY SEEL,M. (NOTE) The GeneXpert MRSA Assay (FDA approved for NASAL specimens only), is one component of a comprehensive MRSA colonization surveillance program. It is not intended to diagnose MRSA infection nor to guide or monitor treatment for MRSA infections. Test performance is not FDA approved in patients less than 67 years old. Performed at [facility], [place] [address] [place], [place] [address]         Radiology Studies: No results found.      Scheduled Meds:  aspirin  81 mg Oral Daily   buPROPion  150 mg Oral QPM   Chlorhexidine Gluconate Cloth  6 each Topical Daily   collagenase   Topical Daily   enoxaparin (LOVENOX) injection  40 mg Subcutaneous Q24H   feeding supplement  237 mL Oral TID BM   fludrocortisone  0.1 mg Oral Daily   gabapentin  300 mg Oral TID   influenza vac split quadrivalent PF  0.5 mL Intramuscular Tomorrow-1000   insulin aspart  0-9 Units Subcutaneous TID WC   insulin detemir  5 Units Subcutaneous Daily   ipratropium-albuterol  3 mL Nebulization BID   levothyroxine  150 mcg Oral Q0600   mouth rinse  15 mL Mouth Rinse BID   midodrine  15 mg Oral TID WC   nutrition supplement (JUVEN)  1 packet Oral BID BM   ondansetron (ZOFRAN) IV  4 mg Intravenous Q6H   pantoprazole  40 mg Oral Daily   polyethylene glycol  17 g Oral Daily   senna  1 tablet  Oral Daily   sodium chloride flush  10-40 mL Intracatheter Q12H   venlafaxine XR  150 mg Oral QHS   Continuous Infusions:  sodium chloride Stopped (08/17/21 1512)   piperacillin-tazobactam (ZOSYN)  IV Stopped (08/25/21 0314)     LOS: 10 days     Cordelia Poche, MD [facility] 08/25/2021, 9:07 AM  If 7PM-7AM, please contact night-coverage [email or web address]

## 2021-08-25 NOTE — Progress Notes (Signed)
°    [facility]   Reason for visit: Follow up on septic shock  Interval History: remains afebrile, WBC wnl.  No complaints today.  No associated rash or diarrhea.    Physical Exam: Constitutional:  Vitals:   08/25/21 0757 08/25/21 1216  BP:    Pulse:    Resp:    Temp:  98 F (36.7 C)  SpO2: 100%    patient appears in NAD Respiratory: Normal respiratory effort; CTA B Cardiovascular: RRR MS: no lower extremity erythema  Review of Systems: Constitutional: negative for fevers and chills Integument/breast: negative for rash  Lab Results  Component Value Date   WBC 10.4 08/24/2021   HGB 8.1 (L) 08/24/2021   HCT 28.1 (L) 08/24/2021   MCV 89.2 08/24/2021   PLT 445 (H) 08/24/2021    Lab Results  Component Value Date   CREATININE 0.70 08/24/2021   BUN 28 (H) 08/24/2021   NA 136 08/24/2021   K 3.9 08/24/2021   CL 94 (L) 08/24/2021   CO2 38 (H) 08/24/2021    Lab Results  Component Value Date   ALT 12 08/19/2021   AST 14 (L) 08/19/2021   ALKPHOS 64 08/19/2021     Microbiology: No results found for this or any previous visit (from the past 240 hour(s)).  Impression/Plan:  1. Shock - resolved now and presumed septic.  No positive blood cultures and has been on antibiotics for 10 days.  At this point, she clinically looks well and no pressor support needed.  Will stop the piperacillin/tazobactam.    2.  Cellulitis - resolved now and no further antibiotics indicated.  3.  Wound/ulcer - she will need ongoing care of her ulcers of thigh and ischial area including wound care and nutrition.    I will sign off, call with questions

## 2021-08-26 DIAGNOSIS — R109 Unspecified abdominal pain: Secondary | ICD-10-CM

## 2021-08-26 LAB — GLUCOSE, CAPILLARY
Glucose-Capillary: 104 mg/dL — ABNORMAL HIGH (ref 70–99)
Glucose-Capillary: 105 mg/dL — ABNORMAL HIGH (ref 70–99)
Glucose-Capillary: 111 mg/dL — ABNORMAL HIGH (ref 70–99)
Glucose-Capillary: 116 mg/dL — ABNORMAL HIGH (ref 70–99)
Glucose-Capillary: 118 mg/dL — ABNORMAL HIGH (ref 70–99)
Glucose-Capillary: 73 mg/dL (ref 70–99)
Glucose-Capillary: 96 mg/dL (ref 70–99)
Glucose-Capillary: 96 mg/dL (ref 70–99)
Glucose-Capillary: 99 mg/dL (ref 70–99)

## 2021-08-26 LAB — CBC
HCT: 28 % — ABNORMAL LOW (ref 36.0–46.0)
Hemoglobin: 8 g/dL — ABNORMAL LOW (ref 12.0–15.0)
MCH: 25.3 pg — ABNORMAL LOW (ref 26.0–34.0)
MCHC: 28.6 g/dL — ABNORMAL LOW (ref 30.0–36.0)
MCV: 88.6 fL (ref 80.0–100.0)
Platelets: 402 10*3/uL — ABNORMAL HIGH (ref 150–400)
RBC: 3.16 MIL/uL — ABNORMAL LOW (ref 3.87–5.11)
RDW: 23.6 % — ABNORMAL HIGH (ref 11.5–15.5)
WBC: 8.4 10*3/uL (ref 4.0–10.5)
nRBC: 0 % (ref 0.0–0.2)

## 2021-08-26 LAB — BASIC METABOLIC PANEL
Anion gap: 6 (ref 5–15)
BUN: 26 mg/dL — ABNORMAL HIGH (ref 8–23)
CO2: 37 mmol/L — ABNORMAL HIGH (ref 22–32)
Calcium: 7.9 mg/dL — ABNORMAL LOW (ref 8.9–10.3)
Chloride: 92 mmol/L — ABNORMAL LOW (ref 98–111)
Creatinine, Ser: 0.7 mg/dL (ref 0.44–1.00)
GFR, Estimated: >60 mL/min (ref >60)
Glucose, Bld: 98 mg/dL (ref 70–99)
Potassium: 3.6 mmol/L (ref 3.5–5.1)
Sodium: 135 mmol/L (ref 135–145)

## 2021-08-26 LAB — IRON AND TIBC
Iron: 19 ug/dL — ABNORMAL LOW (ref 28–170)
Saturation Ratios: 7 % — ABNORMAL LOW (ref 10.4–31.8)
TIBC: 288 ug/dL (ref 250–450)
UIBC: 269 ug/dL

## 2021-08-26 LAB — FERRITIN: Ferritin: 24 ng/mL (ref 11–307)

## 2021-08-26 MED ORDER — SODIUM CHLORIDE 0.9 % IV BOLUS
1000.0000 mL | Freq: Once | INTRAVENOUS | Status: AC
  Administered 2021-08-26: 09:00:00 1000 mL via INTRAVENOUS

## 2021-08-26 NOTE — Progress Notes (Signed)
Nutrition Follow-up  DOCUMENTATION CODES:   Not applicable  INTERVENTION:  - continue Ensure Enlive TID and Juven BID.    NUTRITION DIAGNOSIS:   Increased nutrient needs related to acute illness, wound healing as evidenced by estimated needs. -ongoing  GOAL:   Patient will meet greater than or equal to 90% of their needs -met  MONITOR:   PO intake, Supplement acceptance, Labs, Weight trends, Skin   ASSESSMENT:   66 year old female with medical history of COPD on 2L O2. GERD, chronic lower extremity venous insufficiency and edema., gout, hypercholesterolemia. Patient presented to the ED due to wounds, weeping R leg wound.  Patient has been eating 50-100% at meals since breakfast on 1/19. She has been accepting Ensure supplements 75-90% of the time offered and Juven supplements 90% of the time offered.   Weight has been fluctuating throughout hospitalization. Weight today is the highest weight and is an outlier. Previously the highest weight was 199 which she weighed on 1/21. Weight today is 212 lb. Weight on admission date of 1/14 was documented as 190 lb but appears to possibly be a stated weight.   MD note from 1/24 indicated d/c plan is for home vs SNF once blood pressure stable.   Labs reviewed; CBGs: 73-105 mg/dl, Cl: 92 mmol/l, BUN: 26 mg/dl, Ca: 7.9 mg/dl.  Medications reviewed; sliding scale novolog, 150 mcg oral synthroid/day, 40 mg oral protonix/day, 17 g miralax BID, 1 tablet senokot/day.   Diet Order:   Diet Order             Diet Carb Modified Fluid consistency: Thin; Room service appropriate? Yes  Diet effective now                   EDUCATION NEEDS:   Education needs have been addressed  Skin:  Skin Assessment: Skin Integrity Issues: Skin Integrity Issues:: Stage III, Stage II, Diabetic Ulcer, Other (Comment) Stage II: bilateral buttocks Stage III: R thigh Diabetic Ulcer: R heel Other: venous stasis ulcers to bilateral heels  Last BM:  1/24  (type 6 x2, medium amount and large amount)  Height:   Ht Readings from Last 1 Encounters:  08/15/21 _0  (1.626 m)    Weight:   Wt Readings from Last 1 Encounters:  08/26/21 96.2 kg     Estimated Nutritional Needs:  Kcal:  2150-2350 kcal Protein:  110-125 grams Fluid:  >/= 2.3 L/day     Jarome Matin, MS, RD, LDN Inpatient Clinical Dietitian RD pager # available in [facility]  After hours/weekend pager # available in [facility]

## 2021-08-26 NOTE — Progress Notes (Signed)
° ° °  OVERNIGHT PROGRESS REPORT  Notified for continued fluctuation of CBG. Patient was given PO carbs due to a low CBG. Fluctuations occurring over multiple hours throughout the day. CBGs will be every 2 hours for the immediate time period.    Chinita Greenland MSNA MSN ACNPC-AG Acute Care Nurse Practitioner [facility]

## 2021-08-26 NOTE — Progress Notes (Signed)
PT Cancellation Note  Patient Details Name: [name] MRN: [id number] DOB: July 10, 1956   Cancelled Treatment:     attempted to see twice Am [facility] PM dressing changes Pt has been evaluated with rec for SNF.  Will continue to follow and attempt to see another day as schedule permits.   Nathanial Rancher 08/26/2021, 3:16 PM

## 2021-08-26 NOTE — Progress Notes (Signed)
PROGRESS NOTE    [name]  [id number] DOB: [birth year] DOA: 08/14/2021 PCP: Crist Infante, MD    Brief Narrative:  66 y.o. female with a history of COPD on 2 L oxygen, GERD, chronic pain, anemia. Patient was admitted for septic shock from leg wound. She required vasopressor support. Blood cultures with no growth. ID consulted for help with antibiotic management. Vasopressor support weaned off. Antibiotics discontinued.  Assessment & Plan:   Principal Problem:   Sepsis ([place]) Active Problems:   Septic shock ([place])   DNR (do not resuscitate)   Pressure injury of skin  Septic shock Secondary to RLE cellulitis. Patient required vasopressor support, stress dose steroids and midodrine. Vasopressors weaned off. -Patient's bp remains low this AM, requiring IVF bolus. Continues to [employer] well -Per PCCM, goal to keep MAP >55 -Ultimate goal to wean steroids   RLE cellulitis Patient empirically started on Vancomycin and Zosyn. ID consulted with current recommendations to discontinue antibiotics. Wound care was consulted   Hypotension Persistent. In setting of septic shock but has remained persistent after treatment of infection. PCCM recommending map goal of >55. Patient is currently managed on midodrine and is weaning off Florinef -Per above, still requiring NS bolus this AM -Ultimate goal to wean stress steroids   Acute on chronic respiratory failure with hypoxia Currently at baseline oxygen   COPD -Continue Duonebs -Stable   Anxiety -Continue Wellbutrin, Effexor and gabapentin -Continue Xanax   Hypothyroidism -Continue Synthroid -Most recent TSH from 1/16 of 3.003   Chronic opiate use -Continue oxycodone prn   Hyponatremia Mild. Asymptomatic. Resolved.   GERD -Continue Protonix   Anemia of chronic disease Stable. -Iron panel   Hyperglycemia Hemoglobin A1C of 5.6; does not meet criteria for prediabetes.  -Discontinued Levemir this visit -Recently glucose  noted to be in the 200's -Appreciate input by diabetic coordinator. Will start 2 units short acting insulin before meals   Failure to thrive Severe malnutrition Dietitian recommendations (1/17): will order Ensure Enlive BID, each supplement provides 350 kcal and 20 grams of protein   will order 1 packet Juven BID, each packet provides 95 calories, 2.5 grams of protein (collagen), and 9.8 grams of carbohydrate (3 grams sugar); also contains 7 grams of L-arginine and L-glutamine, 300 mg vitamin C, 15 mg vitamin E, 1.2 mcg vitamin B-12, 9.5 mg zinc, 200 mg calcium, and 1.5 g  Calcium Beta-hydroxy-Beta-methylbutyrate to support wound healing.   will follow-up 1/18 for day #2 Calorie Count results.   Poor social situation Bedbugs and fleas reported in the house -Recs for SNF at time of d/c   Pressure injury Left/medial buttock, right/posterior buttock, posterior thigh. Present on admission.    DVT prophylaxis: Lovenox subq Code Status: DNR Family Communication: Pt in room, family not at bedside  Status is: Inpatient  Remains inpatient appropriate because: Severity of illness   Consultants:  PCCM ID  Procedures:  TTE  Antimicrobials: Anti-infectives (From admission, onward)    Start     Dose/Rate Route Frequency Ordered Stop   08/15/21 2200  vancomycin (VANCOREADY) IVPB 1250 mg/250 mL  Status:  Discontinued        1,250 mg 166.7 mL/hr over 90 Minutes Intravenous Every 24 hours 08/15/21 0154 08/24/21 1237   08/15/21 1600  piperacillin-tazobactam (ZOSYN) IVPB 3.375 g  Status:  Discontinued        3.375 g 12.5 mL/hr over 240 Minutes Intravenous Every 8 hours 08/15/21 1549 08/25/21 1506   08/15/21 1400  metroNIDAZOLE (FLAGYL) IVPB 500 mg  Status:  Discontinued        500 mg 100 mL/hr over 60 Minutes Intravenous Every 12 hours 08/15/21 1229 08/15/21 1549   08/15/21 0445  ceFEPIme (MAXIPIME) 2 g in sodium chloride 0.9 % 100 mL IVPB  Status:  Discontinued        2 g 200 mL/hr  over 30 Minutes Intravenous Every 8 hours 08/15/21 0442 08/15/21 1549   08/15/21 0015  vancomycin (VANCOCIN) IVPB 1000 mg/200 mL premix        1,000 mg 200 mL/hr over 60 Minutes Intravenous  Once 08/15/21 0006 08/15/21 0150   08/14/21 2300  vancomycin (VANCOREADY) IVPB 750 mg/150 mL  Status:  Discontinued        750 mg 150 mL/hr over 60 Minutes Intravenous  Once 08/14/21 2246 08/15/21 0006       Subjective: Without complaints today  Objective: Vitals:   08/26/21 1000 08/26/21 1100 08/26/21 1130 08/26/21 1200  BP: (!) 80/36 (!) 81/48  (!) 100/54  Pulse: 88 92  (!) 105  Resp: 17 12  13   Temp:   98.7 F (37.1 C)   TempSrc:   Oral   SpO2: (!) 84% 98%  97%  Weight:      Height:        Intake/Output Summary (Last 24 hours) at 08/26/2021 1255 Last data filed at 08/26/2021 1031 Gross per 24 hour  Intake 1152.46 ml  Output 1900 ml  Net -747.54 ml   Filed Weights   08/23/21 0500 08/24/21 0454 08/26/21 0500  Weight: 90 kg 90.2 kg 96.2 kg    Examination: General exam: Awake, laying in bed, in nad Respiratory system: Normal respiratory effort, no wheezing Cardiovascular system: regular rate, s1, s2 Gastrointestinal system: Soft, nondistended, positive BS Central nervous system: CN2-12 grossly intact, strength intact Extremities: Perfused, no clubbing Skin: Normal skin turgor, no notable skin lesions seen Psychiatry: Mood normal // no visual hallucinations   Data Reviewed: I have personally reviewed following labs and imaging studies  CBC: Recent Labs  Lab 08/19/21 2015 08/22/21 0423 08/24/21 0404 08/26/21 0643  WBC 14.3* 12.9* 10.4 8.4  NEUTROABS 11.7*  --   --   --   HGB 9.0* 8.4* 8.1* 8.0*  HCT 31.3* 30.0* 28.1* 28.0*  MCV 88.7 90.9 89.2 88.6  PLT 781* 526* 445* [id number]   Basic Metabolic Panel: Recent Labs  Lab 08/21/21 0632 08/22/21 0423 08/23/21 0558 08/24/21 0404 08/25/21 1851 08/26/21 0643  NA 137 139 138 136  --  135  K 4.3 4.3 3.9 3.9  --  3.6  CL  97* 96* 95* 94*  --  92*  CO2 35* 36* 36* 38*  --  37*  GLUCOSE 170* 179* 166* 157* 76 98  BUN 22 22 24* 28*  --  26*  CREATININE 0.72   0.62 0.70 0.69 0.70  --  0.70  CALCIUM 8.1* 8.3* 8.4* 8.4*  --  7.9*   GFR: Estimated Creatinine Clearance: 78.9 mL/min (by C-G formula based on SCr of 0.7 mg/dL). Liver Function Tests: No results for input(s): AST, ALT, ALKPHOS, BILITOT, PROT, ALBUMIN in the last 168 hours. No results for input(s): LIPASE, AMYLASE in the last 168 hours. No results for input(s): AMMONIA in the last 168 hours. Coagulation Profile: No results for input(s): INR, PROTIME in the last 168 hours. Cardiac Enzymes: No results for input(s): CKTOTAL, CKMB, CKMBINDEX, TROPONINI in the last 168 hours. BNP (last 3 results) No results for input(s): PROBNP in the last 8760 hours. HbA1C:  No results for input(s): HGBA1C in the last 72 hours. CBG: Recent Labs  Lab 08/26/21 0309 08/26/21 0551 08/26/21 0734 08/26/21 0933 08/26/21 1208  GLUCAP 73 96 99 96 105*   Lipid Profile: No results for input(s): CHOL, HDL, LDLCALC, TRIG, CHOLHDL, LDLDIRECT in the last 72 hours. Thyroid Function Tests: No results for input(s): TSH, T4TOTAL, FREET4, T3FREE, THYROIDAB in the last 72 hours. Anemia Panel: Recent Labs    08/26/21 0643  FERRITIN 24  TIBC 288  IRON 19*   Sepsis Labs: No results for input(s): PROCALCITON, LATICACIDVEN in the last 168 hours.  No results found for this or any previous visit (from the past 240 hour(s)).   Radiology Studies: DG Abd Portable 1V  Result Date: 08/25/2021 CLINICAL DATA:  Abdominal pain EXAM: PORTABLE ABDOMEN - 1 VIEW COMPARISON:  CT done on 06/16/2021 FINDINGS: Bowel gas pattern is nonspecific. There is no significant dilation of small-bowel loops. Stomach is not distended. Moderate to large amount of stool is present in colon. Severe degenerative changes are noted in the lower thoracic spine and lumbar spine. Surgical clips are seen in the right  upper quadrant and right side of pelvis. IMPRESSION: Nonspecific bowel gas pattern. Moderate to large amount of stool is present in colon. Severe degenerative changes are noted in the lumbar spine. Electronically Signed   By: Elmer Picker M.D.   On: 08/25/2021 11:57    Scheduled Meds:  aspirin  81 mg Oral Daily   buPROPion  150 mg Oral QPM   Chlorhexidine Gluconate Cloth  6 each Topical Daily   collagenase   Topical Daily   enoxaparin (LOVENOX) injection  40 mg Subcutaneous Q24H   feeding supplement  237 mL Oral TID BM   fludrocortisone  0.1 mg Oral Daily   gabapentin  300 mg Oral TID   influenza vac split quadrivalent PF  0.5 mL Intramuscular Tomorrow-1000   insulin aspart  0-9 Units Subcutaneous TID WC   ipratropium-albuterol  3 mL Nebulization BID   levothyroxine  150 mcg Oral Q0600   mouth rinse  15 mL Mouth Rinse BID   midodrine  15 mg Oral TID WC   nutrition supplement (JUVEN)  1 packet Oral BID BM   ondansetron (ZOFRAN) IV  4 mg Intravenous Q6H   pantoprazole  40 mg Oral Daily   polyethylene glycol  17 g Oral BID   senna  1 tablet Oral Daily   sodium chloride flush  10-40 mL Intracatheter Q12H   venlafaxine XR  150 mg Oral QHS   Continuous Infusions:  sodium chloride 10 mL/hr at 08/26/21 1031     LOS: 11 days   Marylu Lund, MD [facility] Pager On Amion  If 7PM-7AM, please contact night-coverage 08/26/2021, 12:55 PM

## 2021-08-27 ENCOUNTER — Inpatient Hospital Stay (HOSPITAL_COMMUNITY): Payer: Medicaid Other

## 2021-08-27 DIAGNOSIS — R609 Edema, unspecified: Secondary | ICD-10-CM | POA: Diagnosis not present

## 2021-08-27 LAB — GLUCOSE, CAPILLARY
Glucose-Capillary: >600 mg/dL (ref 70–99)
Glucose-Capillary: 98 mg/dL (ref 70–99)
Glucose-Capillary: 95 mg/dL (ref 70–99)
Glucose-Capillary: 173 mg/dL — ABNORMAL HIGH (ref 70–99)
Glucose-Capillary: 206 mg/dL — ABNORMAL HIGH (ref 70–99)

## 2021-08-27 LAB — CBC
HCT: 24.8 % — ABNORMAL LOW (ref 36.0–46.0)
Hemoglobin: 7.5 g/dL — ABNORMAL LOW (ref 12.0–15.0)
MCH: 26.4 pg (ref 26.0–34.0)
MCHC: 30.2 g/dL (ref 30.0–36.0)
MCV: 87.3 fL (ref 80.0–100.0)
Platelets: 374 10*3/uL (ref 150–400)
RBC: 2.84 MIL/uL — ABNORMAL LOW (ref 3.87–5.11)
RDW: 23.4 % — ABNORMAL HIGH (ref 11.5–15.5)
WBC: 10 10*3/uL (ref 4.0–10.5)
nRBC: 0 % (ref 0.0–0.2)

## 2021-08-27 LAB — COMPREHENSIVE METABOLIC PANEL
ALT: 27 U/L (ref 0–44)
AST: 16 U/L (ref 15–41)
Albumin: 2.2 g/dL — ABNORMAL LOW (ref 3.5–5.0)
Alkaline Phosphatase: 54 U/L (ref 38–126)
Anion gap: 5 (ref 5–15)
BUN: 23 mg/dL (ref 8–23)
CO2: 36 mmol/L — ABNORMAL HIGH (ref 22–32)
Calcium: 7.7 mg/dL — ABNORMAL LOW (ref 8.9–10.3)
Chloride: 94 mmol/L — ABNORMAL LOW (ref 98–111)
Creatinine, Ser: 0.65 mg/dL (ref 0.44–1.00)
GFR, Estimated: >60 mL/min (ref >60)
Glucose, Bld: 101 mg/dL — ABNORMAL HIGH (ref 70–99)
Potassium: 3.8 mmol/L (ref 3.5–5.1)
Sodium: 135 mmol/L (ref 135–145)
Total Bilirubin: 0.5 mg/dL (ref 0.3–1.2)
Total Protein: 4.7 g/dL — ABNORMAL LOW (ref 6.5–8.1)

## 2021-08-27 MED ORDER — ONDANSETRON HCL 4 MG/2ML IJ SOLN: 4.0000 mg | Freq: Four times a day (QID) | INTRAMUSCULAR | Status: DC | PRN

## 2021-08-27 MED ORDER — SODIUM CHLORIDE 0.9 % IV BOLUS
500.0000 mL | Freq: Once | INTRAVENOUS | Status: AC
  Administered 2021-08-27: 500 mL via INTRAVENOUS

## 2021-08-27 MED ORDER — ALTEPLASE 2 MG IJ SOLR
2.0000 mg | Freq: Once | INTRAMUSCULAR | Status: AC
  Administered 2021-08-27: 2 mg
  Filled 2021-08-27: qty 2

## 2021-08-27 MED ORDER — ALBUMIN HUMAN 25 % IV SOLN
25.0000 g | Freq: Once | INTRAVENOUS | Status: AC
  Administered 2021-08-27: 25 g via INTRAVENOUS
  Filled 2021-08-27: qty 100

## 2021-08-27 MED ORDER — HYDROCORTISONE SOD SUC (PF) 100 MG IJ SOLR
75.0000 mg | Freq: Two times a day (BID) | INTRAMUSCULAR | Status: DC
  Administered 2021-08-27 – 2021-08-30 (×6): 75 mg via INTRAVENOUS
  Filled 2021-08-27 (×6): qty 2

## 2021-08-27 NOTE — Progress Notes (Signed)
PT Cancellation Note  Patient Details Name: [name] MRN: [id number] DOB: [birth year]   Cancelled Treatment:     per chart review of new orders and vitals, will need to hold off for today.  Pt has been evaluated with rec for SNF.  Will continue to follow and attempt top see another day as schedule permits.     Felecia Shelling  PTA Acute  Rehabilitation Services Pager      [phone] Office      [phone]

## 2021-08-27 NOTE — Progress Notes (Addendum)
Garner Nash, NP notified of decreasing BP, MAP of 47-53. Orders received for bolus. Will continue to monitor.

## 2021-08-27 NOTE — Plan of Care (Signed)
°  Problem: Elimination: Goal: Will not experience complications related to bowel motility Outcome: Progressing Goal: Will not experience complications related to urinary retention Outcome: Progressing   Problem: Health Behavior/Discharge Planning: Goal: Ability to manage health-related needs will improve Outcome: Not Progressing   Problem: Clinical Measurements: Goal: Ability to maintain clinical measurements within normal limits will improve Outcome: Not Progressing Note: Pt still experiencing hypotension.    Problem: Pain Managment: Goal: General experience of comfort will improve Outcome: Not Progressing   Problem: Skin Integrity: Goal: Risk for impaired skin integrity will decrease Outcome: Not Progressing

## 2021-08-27 NOTE — TOC Progression Note (Signed)
Transition of Care [facility]) - Progression Note    Patient Details  Name: [name] MRN: [id number] Date of Birth: November 26, 1955  [facility] CM/SW Contact  Preeya Cleckley, Olegario Messier, RN Phone Number: 08/27/2021, 3:48 PM  Clinical Narrative:  Spoke to patient about recc-SNF-patient declines SNF since has medicaid-I have to pay my bills & would not be able to give my check to a SNF-prefers home. CM will f/u on [facility];patient has transportation set up-she can call ahead of time for transportation.Has home 02. PTAR for home.     Expected Discharge Plan: Home/Self Care Barriers to Discharge: Continued Medical Work up  Expected Discharge Plan and Services Expected Discharge Plan: Home/Self Care   Discharge Planning Services: CM Consult Post Acute Care Choice: Resumption of Svcs/PTA Provider Living arrangements for the past 2 months: Single Family Home                                       Social Determinants of Health (SDOH) Interventions    Readmission Risk Interventions No flowsheet data found.

## 2021-08-27 NOTE — Consult Note (Signed)
WOC consulted for this patient for thigh wound, please note WOC consult notes from 08/17/21 and most recently 08/24/21 where I have updated the wound care orders for this patient's wounds this week. No need for additional consultation at this time. Orders reviewed in chart and are accurate at this time.   [place], [place], [place]

## 2021-08-27 NOTE — Progress Notes (Signed)
PROGRESS NOTE    [name]  [id number] DOB: 01/21/1956 DOA: 08/14/2021 PCP: Rodrigo RanPerini, Mark, MD    Brief Narrative:  66 y.o. female with a history of COPD on 2 L oxygen, GERD, chronic pain, anemia. Patient was admitted for septic shock from leg wound. She required vasopressor support. Blood cultures with no growth. ID consulted for help with antibiotic management. Vasopressor support weaned off. Antibiotics discontinued.  Assessment & Plan:   Principal Problem:   Sepsis ([facility]) Active Problems:   Septic shock ([facility])   DNR (do not resuscitate)   Pressure injury of skin  Septic shock Secondary to RLE cellulitis. Patient required vasopressor support, stress dose steroids and midodrine. Vasopressors weaned off. -Patient's bp has remained persistently low, requiring intermittent fluid boluses -Per PCCM, goal to keep MAP >55 -Given gross anasarca, question pt's ability to absorb PO steroids. Will transition to IV hydrocortisone to allow BP to diureses   RLE cellulitis Patient empirically started on Vancomycin and Zosyn. ID consulted with current recommendations to discontinue antibiotics. Wound care consulted   Hypotension Persistent. In setting of septic shock but has remained persistent after treatment of infection. PCCM recommending map goal of >55. Patient is currently managed on midodrine with florinef -Per above, still requiring NS boluses intermittently -Given anasarca, question pt's ability to absorb PO meds -Will give trial of IV hydrocortisone to allow BP to diurese   Acute on chronic respiratory failure with hypoxia Currently at baseline oxygen   COPD -Continue Duonebs -Stable   Anxiety -Continue Wellbutrin, Effexor and gabapentin -Continue Xanax   Hypothyroidism -Continue Synthroid -Most recent TSH from 1/16 of 3.003   Chronic opiate use -Continue oxycodone prn   Hyponatremia Mild. Asymptomatic. Resolved.   GERD -Continue Protonix   Anemia of chronic  disease Stable. -Iron panel   Hyperglycemia Hemoglobin A1C of 5.6; does not meet criteria for prediabetes.  -Discontinued Levemir this visit -Recently glucose noted to be in the 200's -Appreciate input by diabetic coordinator. Will start 2 units short acting insulin before meals   Failure to thrive Severe malnutrition Dietitian recommendations (1/17): will order Ensure Enlive BID, each supplement provides 350 kcal and 20 grams of protein   will order 1 packet Juven BID, each packet provides 95 calories, 2.5 grams of protein (collagen), and 9.8 grams of carbohydrate (3 grams sugar); also contains 7 grams of L-arginine and L-glutamine, 300 mg vitamin C, 15 mg vitamin E, 1.2 mcg vitamin B-12, 9.5 mg zinc, 200 mg calcium, and 1.5 g  Calcium Beta-hydroxy-Beta-methylbutyrate to support wound healing.   will follow-up 1/18 for day #2 Calorie Count results.   Poor social situation Bedbugs and fleas reported in the house -Recs for SNF at time of d/c   Pressure injury Left/medial buttock, right/posterior buttock, posterior thigh. Present on admission.   Anasarca -Likely secondary to volume resuscitation in setting of hypoalbuminemia -Once BP allows, would attempt diuresis with lasix -Have ordered 25g IV albumin  -Will check UE to r/o DVT  DVT prophylaxis: Lovenox subq Code Status: DNR Family Communication: Pt in room, family not at bedside  Status is: Inpatient  Remains inpatient appropriate because: Severity of illness   Consultants:  PCCM ID  Procedures:  TTE  Antimicrobials: Anti-infectives (From admission, onward)    Start     Dose/Rate Route Frequency Ordered Stop   08/15/21 2200  vancomycin (VANCOREADY) IVPB 1250 mg/250 mL  Status:  Discontinued        1,250 mg 166.7 mL/hr over 90 Minutes Intravenous Every 24  hours 08/15/21 0154 08/24/21 1237   08/15/21 1600  piperacillin-tazobactam (ZOSYN) IVPB 3.375 g  Status:  Discontinued        3.375 g 12.5 mL/hr over 240  Minutes Intravenous Every 8 hours 08/15/21 1549 08/25/21 1506   08/15/21 1400  metroNIDAZOLE (FLAGYL) IVPB 500 mg  Status:  Discontinued        500 mg 100 mL/hr over 60 Minutes Intravenous Every 12 hours 08/15/21 1229 08/15/21 1549   08/15/21 0445  ceFEPIme (MAXIPIME) 2 g in sodium chloride 0.9 % 100 mL IVPB  Status:  Discontinued        2 g 200 mL/hr over 30 Minutes Intravenous Every 8 hours 08/15/21 0442 08/15/21 1549   08/15/21 0015  vancomycin (VANCOCIN) IVPB 1000 mg/200 mL premix        1,000 mg 200 mL/hr over 60 Minutes Intravenous  Once 08/15/21 0006 08/15/21 0150   08/14/21 2300  vancomycin (VANCOREADY) IVPB 750 mg/150 mL  Status:  Discontinued        750 mg 150 mL/hr over 60 Minutes Intravenous  Once 08/14/21 2246 08/15/21 0006       Subjective: No complaints this AM  Objective: Vitals:   08/27/21 1000 08/27/21 1128 08/27/21 1200 08/27/21 1300  BP: (!) 94/41 (!) 116/58 (!) 87/59 (!) 101/40  Pulse: (!) 102 (!) 113 99 98  Resp: 14 17 14 15   Temp:      TempSrc:      SpO2: 99% 95% 99% 100%  Weight:      Height:        Intake/Output Summary (Last 24 hours) at 08/27/2021 1338 Last data filed at 08/27/2021 1143 Gross per 24 hour  Intake 947.65 ml  Output 2550 ml  Net -1602.35 ml    Filed Weights   08/24/21 0454 08/26/21 0500 08/27/21 0500  Weight: 90.2 kg 96.2 kg 98.1 kg    Examination: General exam: Conversant, in no acute distress Respiratory system: normal chest rise, clear, no audible wheezing Cardiovascular system: regular rhythm, s1-s2 Gastrointestinal system: Nondistended, nontender, pos BS Central nervous system: No seizures, no tremors Extremities: No cyanosis, no joint deformities, edema in all extremities Skin: No rashes, no pallor, generalized edema Psychiatry: Affect normal // no auditory hallucinations   Data Reviewed: I have personally reviewed following labs and imaging studies  CBC: Recent Labs  Lab 08/22/21 0423 08/24/21 0404  08/26/21 0643 08/27/21 0516  WBC 12.9* 10.4 8.4 10.0  HGB 8.4* 8.1* 8.0* 7.5*  HCT 30.0* 28.1* 28.0* 24.8*  MCV 90.9 89.2 88.6 87.3  PLT 526* 445* 402* 374    Basic Metabolic Panel: Recent Labs  Lab 08/22/21 0423 08/23/21 0558 08/24/21 0404 08/25/21 1851 08/26/21 0643 08/27/21 0516  NA 139 138 136  --  135 135  K 4.3 3.9 3.9  --  3.6 3.8  CL 96* 95* 94*  --  92* 94*  CO2 36* 36* 38*  --  37* 36*  GLUCOSE 179* 166* 157* 76 98 101*  BUN 22 24* 28*  --  26* 23  CREATININE 0.70 0.69 0.70  --  0.70 0.65  CALCIUM 8.3* 8.4* 8.4*  --  7.9* 7.7*    GFR: Estimated Creatinine Clearance: 79.8 mL/min (by C-G formula based on SCr of 0.65 mg/dL). Liver Function Tests: Recent Labs  Lab 08/27/21 0516  AST 16  ALT 27  ALKPHOS 54  BILITOT 0.5  PROT 4.7*  ALBUMIN 2.2*   No results for input(s): LIPASE, AMYLASE in the last 168 hours.  No results for input(s): AMMONIA in the last 168 hours. Coagulation Profile: No results for input(s): INR, PROTIME in the last 168 hours. Cardiac Enzymes: No results for input(s): CKTOTAL, CKMB, CKMBINDEX, TROPONINI in the last 168 hours. BNP (last 3 results) No results for input(s): PROBNP in the last 8760 hours. HbA1C: No results for input(s): HGBA1C in the last 72 hours. CBG: Recent Labs  Lab 08/26/21 1335 08/26/21 1616 08/26/21 2113 08/27/21 0757 08/27/21 1144  GLUCAP 111* 118* 116* 98 95    Lipid Profile: No results for input(s): CHOL, HDL, LDLCALC, TRIG, CHOLHDL, LDLDIRECT in the last 72 hours. Thyroid Function Tests: No results for input(s): TSH, T4TOTAL, FREET4, T3FREE, THYROIDAB in the last 72 hours. Anemia Panel: Recent Labs    08/26/21 0643  FERRITIN 24  TIBC 288  IRON 19*    Sepsis Labs: No results for input(s): PROCALCITON, LATICACIDVEN in the last 168 hours.  No results found for this or any previous visit (from the past 240 hour(s)).   Radiology Studies: No results found.  Scheduled Meds:  aspirin  81 mg Oral  Daily   buPROPion  150 mg Oral QPM   Chlorhexidine Gluconate Cloth  6 each Topical Daily   collagenase   Topical Daily   enoxaparin (LOVENOX) injection  40 mg Subcutaneous Q24H   feeding supplement  237 mL Oral TID BM   gabapentin  300 mg Oral TID   hydrocortisone sod succinate (SOLU-CORTEF) inj  75 mg Intravenous Q12H   influenza vac split quadrivalent PF  0.5 mL Intramuscular Tomorrow-1000   insulin aspart  0-9 Units Subcutaneous TID WC   ipratropium-albuterol  3 mL Nebulization BID   levothyroxine  150 mcg Oral Q0600   mouth rinse  15 mL Mouth Rinse BID   midodrine  15 mg Oral TID WC   nutrition supplement (JUVEN)  1 packet Oral BID BM   pantoprazole  40 mg Oral Daily   polyethylene glycol  17 g Oral BID   senna  1 tablet Oral Daily   sodium chloride flush  10-40 mL Intracatheter Q12H   venlafaxine XR  150 mg Oral QHS   Continuous Infusions:  sodium chloride Stopped (08/26/21 1725)     LOS: 12 days   Rickey Barbara, MD [facility] Pager On Amion  If 7PM-7AM, please contact night-coverage 08/27/2021, 1:38 PM

## 2021-08-28 LAB — CBC
HCT: 26.5 % — ABNORMAL LOW (ref 36.0–46.0)
Hemoglobin: 7.7 g/dL — ABNORMAL LOW (ref 12.0–15.0)
MCH: 25.6 pg — ABNORMAL LOW (ref 26.0–34.0)
MCHC: 29.1 g/dL — ABNORMAL LOW (ref 30.0–36.0)
MCV: 88 fL (ref 80.0–100.0)
Platelets: 408 10*3/uL — ABNORMAL HIGH (ref 150–400)
RBC: 3.01 MIL/uL — ABNORMAL LOW (ref 3.87–5.11)
RDW: 23 % — ABNORMAL HIGH (ref 11.5–15.5)
WBC: 14.6 10*3/uL — ABNORMAL HIGH (ref 4.0–10.5)
nRBC: 0 % (ref 0.0–0.2)

## 2021-08-28 LAB — GLUCOSE, CAPILLARY
Glucose-Capillary: 187 mg/dL — ABNORMAL HIGH (ref 70–99)
Glucose-Capillary: 125 mg/dL — ABNORMAL HIGH (ref 70–99)
Glucose-Capillary: 129 mg/dL — ABNORMAL HIGH (ref 70–99)
Glucose-Capillary: 142 mg/dL — ABNORMAL HIGH (ref 70–99)

## 2021-08-28 LAB — COMPREHENSIVE METABOLIC PANEL
ALT: 23 U/L (ref 0–44)
AST: 19 U/L (ref 15–41)
Albumin: 2.8 g/dL — ABNORMAL LOW (ref 3.5–5.0)
Alkaline Phosphatase: 49 U/L (ref 38–126)
Anion gap: 6 (ref 5–15)
BUN: 20 mg/dL (ref 8–23)
CO2: 35 mmol/L — ABNORMAL HIGH (ref 22–32)
Calcium: 8.1 mg/dL — ABNORMAL LOW (ref 8.9–10.3)
Chloride: 95 mmol/L — ABNORMAL LOW (ref 98–111)
Creatinine, Ser: 0.64 mg/dL (ref 0.44–1.00)
GFR, Estimated: >60 mL/min (ref >60)
Glucose, Bld: 173 mg/dL — ABNORMAL HIGH (ref 70–99)
Potassium: 3.4 mmol/L — ABNORMAL LOW (ref 3.5–5.1)
Sodium: 136 mmol/L (ref 135–145)
Total Bilirubin: 0.2 mg/dL — ABNORMAL LOW (ref 0.3–1.2)
Total Protein: 5.4 g/dL — ABNORMAL LOW (ref 6.5–8.1)

## 2021-08-28 MED ORDER — DAKINS (1/4 STRENGTH) 0.125 % EX SOLN
Freq: Two times a day (BID) | CUTANEOUS | Status: AC
  Administered 2021-08-28: 1
  Filled 2021-08-28 (×5): qty 473

## 2021-08-28 MED ORDER — POTASSIUM CHLORIDE CRYS ER 20 MEQ PO TBCR
60.0000 meq | EXTENDED_RELEASE_TABLET | Freq: Once | ORAL | Status: AC
  Administered 2021-08-28: 60 meq via ORAL
  Filled 2021-08-28: qty 3

## 2021-08-28 MED ORDER — FUROSEMIDE 10 MG/ML IJ SOLN
40.0000 mg | Freq: Once | INTRAMUSCULAR | Status: AC
  Administered 2021-08-28: 40 mg via INTRAVENOUS
  Filled 2021-08-28: qty 4

## 2021-08-28 NOTE — Progress Notes (Signed)
PROGRESS NOTE    [name]  [id number] DOB: October 26, 1955 DOA: 08/14/2021 PCP: Crist Infante, MD    Brief Narrative:  66 y.o. female with a history of COPD on 2 L oxygen, GERD, chronic pain, anemia. Patient was admitted for septic shock from leg wound. She required vasopressor support. Blood cultures with no growth. ID consulted for help with antibiotic management. Vasopressor support weaned off. Antibiotics discontinued.  Assessment & Plan:   Principal Problem:   Sepsis ([place]) Active Problems:   Septic shock ([place])   DNR (do not resuscitate)   Pressure injury of skin  Septic shock Secondary to RLE cellulitis. Patient required vasopressor support, stress dose steroids and midodrine. Vasopressors weaned off. -Patient's bp has remained persistently low, requiring intermittent fluid boluses -Per PCCM, goal to keep MAP >55 -Given gross anasarca, question pt's ability to absorb PO steroids. Trial of IV hydrocortisone to allow BP to diureses   RLE cellulitis Patient empirically started on Vancomycin and Zosyn. ID consulted with current recommendations to discontinue antibiotics. Wound care consulted   Hypotension Persistent. In setting of septic shock but has remained persistent after treatment of infection. PCCM recommending map goal of >55. Patient is currently managed on midodrine with florinef -Per above, still requiring NS boluses intermittently -Given anasarca, question pt's ability to absorb PO meds -Will give trial of IV hydrocortisone to allow BP to diurese   Acute on chronic respiratory failure with hypoxia Currently at baseline oxygen   COPD -Continue Duonebs -Stable   Anxiety -Continue Wellbutrin, Effexor and gabapentin -Continue Xanax   Hypothyroidism -Continue Synthroid -Most recent TSH from 1/16 of 3.003   Chronic opiate use -Continue oxycodone prn   Hyponatremia Mild. Asymptomatic. Resolved.   GERD -Continue Protonix   Anemia of chronic  disease Stable. -Iron panel   Hyperglycemia Hemoglobin A1C of 5.6; does not meet criteria for prediabetes.  -Discontinued Levemir this visit -Recently glucose noted to be in the 200's -Appreciate input by diabetic coordinator. Will start 2 units short acting insulin before meals   Failure to thrive Severe malnutrition Dietitian recommendations (1/17): will order Ensure Enlive BID, each supplement provides 350 kcal and 20 grams of protein   will order 1 packet Juven BID, each packet provides 95 calories, 2.5 grams of protein (collagen), and 9.8 grams of carbohydrate (3 grams sugar); also contains 7 grams of L-arginine and L-glutamine, 300 mg vitamin C, 15 mg vitamin E, 1.2 mcg vitamin B-12, 9.5 mg zinc, 200 mg calcium, and 1.5 g  Calcium Beta-hydroxy-Beta-methylbutyrate to support wound healing.   will follow-up 1/18 for day #2 Calorie Count results.   Poor social situation Bedbugs and fleas reported in the house -Recs for SNF at time of d/c   Pressure injury Left/medial buttock, right/posterior buttock, posterior thigh. Present on admission.         Anasarca -Likely secondary to volume resuscitation in setting of hypoalbuminemia -BP improved, will give trial of lasix -UE neg for DVT  DVT prophylaxis: Lovenox subq Code Status: DNR Family Communication: Pt in room, family not at bedside  Status is: Inpatient  Remains inpatient appropriate because: Severity of illness   Consultants:  PCCM ID  Procedures:  TTE  Antimicrobials: Anti-infectives (From admission, onward)    Start     Dose/Rate Route Frequency Ordered Stop   08/15/21 2200  vancomycin (VANCOREADY) IVPB 1250 mg/250 mL  Status:  Discontinued        1,250 mg 166.7 mL/hr over 90 Minutes Intravenous Every 24 hours 08/15/21 0154 08/24/21  1237   08/15/21 1600  piperacillin-tazobactam (ZOSYN) IVPB 3.375 g  Status:  Discontinued        3.375 g 12.5 mL/hr over 240 Minutes Intravenous Every 8 hours 08/15/21  1549 08/25/21 1506   08/15/21 1400  metroNIDAZOLE (FLAGYL) IVPB 500 mg  Status:  Discontinued        500 mg 100 mL/hr over 60 Minutes Intravenous Every 12 hours 08/15/21 1229 08/15/21 1549   08/15/21 0445  ceFEPIme (MAXIPIME) 2 g in sodium chloride 0.9 % 100 mL IVPB  Status:  Discontinued        2 g 200 mL/hr over 30 Minutes Intravenous Every 8 hours 08/15/21 0442 08/15/21 1549   08/15/21 0015  vancomycin (VANCOCIN) IVPB 1000 mg/200 mL premix        1,000 mg 200 mL/hr over 60 Minutes Intravenous  Once 08/15/21 0006 08/15/21 0150   08/14/21 2300  vancomycin (VANCOREADY) IVPB 750 mg/150 mL  Status:  Discontinued        750 mg 150 mL/hr over 60 Minutes Intravenous  Once 08/14/21 2246 08/15/21 0006       Subjective: Reports feeling constipated  Objective: Vitals:   08/28/21 0833 08/28/21 0900 08/28/21 1100 08/28/21 1200  BP:  (!) 100/47 (!) 79/49 99/63  Pulse:  85 84 92  Resp:  (!) 9  13  Temp:   98.8 F (37.1 C)   TempSrc:   Oral   SpO2: 99% 95% 100% 100%  Weight:      Height:        Intake/Output Summary (Last 24 hours) at 08/28/2021 1315 Last data filed at 08/28/2021 1200 Gross per 24 hour  Intake 800 ml  Output 3800 ml  Net -3000 ml    Filed Weights   08/26/21 0500 08/27/21 0500 08/28/21 0500  Weight: 96.2 kg 98.1 kg 96.2 kg    Examination: General exam: Awake, laying in bed, in nad Respiratory system: Normal respiratory effort, no wheezing Cardiovascular system: regular rate, s1, s2 Gastrointestinal system: Soft, nondistended, positive BS Central nervous system: CN2-12 grossly intact, strength intact Extremities: Perfused, no clubbing Skin: Normal skin turgor, open tissue ulcerations per images above Psychiatry: Mood normal // no visual hallucinations   Data Reviewed: I have personally reviewed following labs and imaging studies  CBC: Recent Labs  Lab 08/22/21 0423 08/24/21 0404 08/26/21 0643 08/27/21 0516 08/28/21 0514  WBC 12.9* 10.4 8.4 10.0 14.6*   HGB 8.4* 8.1* 8.0* 7.5* 7.7*  HCT 30.0* 28.1* 28.0* 24.8* 26.5*  MCV 90.9 89.2 88.6 87.3 88.0  PLT 526* 445* 402* 374 408*    Basic Metabolic Panel: Recent Labs  Lab 08/23/21 0558 08/24/21 0404 08/25/21 1851 08/26/21 0643 08/27/21 0516 08/28/21 0514  NA 138 136  --  135 135 136  K 3.9 3.9  --  3.6 3.8 3.4*  CL 95* 94*  --  92* 94* 95*  CO2 36* 38*  --  37* 36* 35*  GLUCOSE 166* 157* 76 98 101* 173*  BUN 24* 28*  --  26* 23 20  CREATININE 0.69 0.70  --  0.70 0.65 0.64  CALCIUM 8.4* 8.4*  --  7.9* 7.7* 8.1*    GFR: Estimated Creatinine Clearance: 78.9 mL/min (by C-G formula based on SCr of 0.64 mg/dL). Liver Function Tests: Recent Labs  Lab 08/27/21 0516 08/28/21 0514  AST 16 19  ALT 27 23  ALKPHOS 54 49  BILITOT 0.5 0.2*  PROT 4.7* 5.4*  ALBUMIN 2.2* 2.8*    No results  for input(s): LIPASE, AMYLASE in the last 168 hours. No results for input(s): AMMONIA in the last 168 hours. Coagulation Profile: No results for input(s): INR, PROTIME in the last 168 hours. Cardiac Enzymes: No results for input(s): CKTOTAL, CKMB, CKMBINDEX, TROPONINI in the last 168 hours. BNP (last 3 results) No results for input(s): PROBNP in the last 8760 hours. HbA1C: No results for input(s): HGBA1C in the last 72 hours. CBG: Recent Labs  Lab 08/27/21 1144 08/27/21 1642 08/27/21 2120 08/28/21 0736 08/28/21 1236  GLUCAP 95 173* 206* 187* 125*    Lipid Profile: No results for input(s): CHOL, HDL, LDLCALC, TRIG, CHOLHDL, LDLDIRECT in the last 72 hours. Thyroid Function Tests: No results for input(s): TSH, T4TOTAL, FREET4, T3FREE, THYROIDAB in the last 72 hours. Anemia Panel: Recent Labs    08/26/21 0643  FERRITIN 24  TIBC 288  IRON 19*    Sepsis Labs: No results for input(s): PROCALCITON, LATICACIDVEN in the last 168 hours.  No results found for this or any previous visit (from the past 240 hour(s)).   Radiology Studies: VAS [place] UPPER EXTREMITY VENOUS DUPLEX  Result  Date: 08/28/2021 UPPER VENOUS STUDY  Patient Name:  [name]  Date of Exam:   08/27/2021 Medical Rec #: [id number]      Accession #:    [id number] Date of Birth: 1956/04/26      Patient Gender: F Patient Age:   74 years Exam Location:  [facility] Procedure:      VAS [place] UPPER EXTREMITY VENOUS DUPLEX Referring Phys: Kinga Cassar --------------------------------------------------------------------------------  Indications: Edema Limitations: Line and bandages. Comparison Study: No previous exams Performing Technologist: Jody Hill RVT, RDMS  Examination Guidelines: A complete evaluation includes B-mode imaging, spectral Doppler, color Doppler, and power Doppler as needed of all accessible portions of each vessel. Bilateral testing is considered an integral part of a complete examination. Limited examinations for reoccurring indications may be performed as noted.  Right Findings: +----------+------------+---------+-----------+----------+-------+  RIGHT      Compressible Phasicity Spontaneous Properties Summary  +----------+------------+---------+-----------+----------+-------+  IJV            Full        Yes        Yes                         +----------+------------+---------+-----------+----------+-------+  Subclavian     Full        Yes        Yes                         +----------+------------+---------+-----------+----------+-------+  Axillary       Full        Yes        Yes                         +----------+------------+---------+-----------+----------+-------+  Brachial       Full        Yes        Yes                         +----------+------------+---------+-----------+----------+-------+  Radial         Full                                               +----------+------------+---------+-----------+----------+-------+  Ulnar          Full                                               +----------+------------+---------+-----------+----------+-------+  Cephalic       Full                                                +----------+------------+---------+-----------+----------+-------+  Basilic        Full        Yes        Yes                         +----------+------------+---------+-----------+----------+-------+ Brachial and basilic veins were difficult to visualize due to PICC line and bandage. Only one of paired brachial veins seen on this exam.  Left Findings: +----------+------------+---------+-----------+----------+-------+  LEFT       Compressible Phasicity Spontaneous Properties Summary  +----------+------------+---------+-----------+----------+-------+  Subclavian     Full        Yes        Yes                         +----------+------------+---------+-----------+----------+-------+  Summary:  Right: No evidence of deep vein thrombosis in the upper extremity. No evidence of superficial vein thrombosis in the upper extremity.  Left: No evidence of thrombosis in the subclavian.  *See table(s) above for measurements and observations.  Diagnosing physician: Jamelle Haring Electronically signed by Jamelle Haring on 08/28/2021 at 5:06:03 AM.    Final     Scheduled Meds:  aspirin  81 mg Oral Daily   buPROPion  150 mg Oral QPM   Chlorhexidine Gluconate Cloth  6 each Topical Daily   collagenase   Topical Daily   enoxaparin (LOVENOX) injection  40 mg Subcutaneous Q24H   feeding supplement  237 mL Oral TID BM   gabapentin  300 mg Oral TID   hydrocortisone sod succinate (SOLU-CORTEF) inj  75 mg Intravenous Q12H   influenza vac split quadrivalent PF  0.5 mL Intramuscular Tomorrow-1000   insulin aspart  0-9 Units Subcutaneous TID WC   ipratropium-albuterol  3 mL Nebulization BID   levothyroxine  150 mcg Oral Q0600   mouth rinse  15 mL Mouth Rinse BID   midodrine  15 mg Oral TID WC   nutrition supplement (JUVEN)  1 packet Oral BID BM   pantoprazole  40 mg Oral Daily   polyethylene glycol  17 g Oral BID   senna  1 tablet Oral Daily   sodium chloride flush  10-40 mL Intracatheter Q12H    venlafaxine XR  150 mg Oral QHS   Continuous Infusions:  sodium chloride Stopped (08/26/21 1725)     LOS: 13 days   Marylu Lund, MD [facility] Pager On Amion  If 7PM-7AM, please contact night-coverage 08/28/2021, 1:15 PM

## 2021-08-28 NOTE — Consult Note (Signed)
WOC consulted when in the ICU today for patient known to our team for changes in the drainage of the posterior thigh wound wound; new onset blue green drainage. Will re-implement 1/4% Dakins for this wound for 7 days to see if this will help with change in the drainage status.   Chioma Mukherjee [facility], CNS, [employer] [phone]

## 2021-08-28 NOTE — TOC Progression Note (Signed)
Transition of Care [facility]) - Progression Note    Patient Details  Name: TOMARA YOUNGBERG MRN: [id number] Date of Birth: Dec 04, 1955  [facility] CM/SW Contact  Ida Rogue, [place] Phone Number: 08/28/2021, 2:50 PM  Clinical Narrative:   Patient seen in follow up to PT recommendation of SNF.  Ms Kowaleski tells me she is planning on staying with God-daughter Tonya [phone] at [address], [place], [place].  We called Tonya and she confirmed this.  Archie Patten states that she worked as a Lawyer prior to becoming disabled, and she is comfortable with changing wound dressings and assisting patient with transfers, ambulation.  She requested DME walker, 3in1, and assistance with pull ups and bed pads. Ms Schorsch will need a ride home on PTAR.  Will also need HH RN and PT, as well as application for PCS submitted. TOC will continue to follow during the course of hospitalization.     Expected Discharge Plan: Home w Home Health Services Barriers to Discharge: Continued Medical Work up  Expected Discharge Plan and Services Expected Discharge Plan: Home w Home Health Services   Discharge Planning Services: CM Consult Post Acute Care Choice: Resumption of Svcs/PTA Provider Living arrangements for the past 2 months: Single Family Home                                       Social Determinants of Health (SDOH) Interventions    Readmission Risk Interventions No flowsheet data found.

## 2021-08-28 NOTE — Progress Notes (Signed)
Occupational Therapy Treatment Patient Details Name: [name] MRN: [id number] DOB: July 14, 1956 Today's Date: 08/28/2021   History of present illness [name] admitted 08/14/2021 with worsening draining right posterior thigh wound and right foot wounds. PMH of COPD on 2 L of oxygen, gout, GERD,chronic lower extremity venous insufficiency.   OT comments  Patient able to support weight using rolling walker and mod A +2 to take few steps to 3N1 commode. Patient heavily reliant on UE support needing total A for perianal care and performed sit to stand twice from commode with mod A +1-2. Recliner brought up behind patient as she was unable to take steps from 3N1 due to fatigue. Continuing to progress, recommend rehab at D/C.    Recommendations for follow up therapy are one component of a multi-disciplinary discharge planning process, led by the attending physician.  Recommendations may be updated based on patient status, additional functional criteria and insurance authorization.    Follow Up Recommendations  Skilled nursing-short term rehab (<3 hours/day)    Assistance Recommended at Discharge Frequent or constant Supervision/Assistance  Patient can return home with the following  Two people to help with walking and/or transfers;Two people to help with bathing/dressing/bathroom;Assistance with cooking/housework;Assist for transportation;Help with stairs or ramp for entrance;Direct supervision/assist for financial management   Equipment Recommendations  Other (comment)       Precautions / Restrictions Precautions Precautions: Fall Precaution Comments: right posterior thigh wound, right foot(plantar heel), buttock wounds Required Braces or Orthoses: Other Brace Other Brace: Prevalon boots Restrictions Weight Bearing Restrictions: No       Mobility Bed Mobility Overal bed mobility: Needs Assistance Bed Mobility: Supine to Sit     Supine to sit: Min  assist, HOB elevated     General bed mobility comments: INcreasd time and min A to upright trunk      Balance Overall balance assessment: History of Falls, Needs assistance Sitting-balance support: Feet unsupported Sitting balance-Leahy Scale: Fair     Standing balance support: Reliant on assistive device for balance Standing balance-Leahy Scale: Poor                             ADL either performed or assessed with clinical judgement   ADL Overall ADL's : Needs assistance/impaired                     Lower Body Dressing: Total assistance;Bed level Lower Body Dressing Details (indicate cue type and reason): Attempted donning socks in long sitting in bed however unable to reach "my gut is full" Toilet Transfer: Moderate assistance;+2 for physical assistance;+2 for safety/equipment;Stand-pivot;Cueing for safety;Cueing for sequencing;BSC/3in1;Rolling walker (2 wheels) Toilet Transfer Details (indicate cue type and reason): Patient able to better support weight through LEs and arms using rolling walker, does fatigue quickly. ABle to take few steps over to 3 N 1 and performed sit to stand twice from commode with recliner brought up behind patient once completed toileting. Toileting- Clothing Manipulation and Hygiene: Total assistance;Sit to/from stand Toileting - Clothing Manipulation Details (indicate cue type and reason): Unable to perform due to heavily reliance of UEs on walker to maintain standing     Functional mobility during ADLs: Moderate assistance;+2 for physical assistance;+2 for safety/equipment;Cueing for safety;Cueing for sequencing;Rolling walker (2 wheels)        Cognition Arousal/Alertness: Awake/alert Behavior During Therapy: WFL for tasks assessed/performed Overall Cognitive Status: No family/caregiver present to determine baseline cognitive functioning  General Comments: very pleasant and  cooperative, appears to have better insight this session stating she will go to rehab vs home              General Comments VSS    Pertinent Vitals/ Pain       Pain Assessment Pain Assessment: Faces Faces Pain Scale: Hurts little more Pain Location: R thigh Pain Descriptors / Indicators: Grimacing Pain Intervention(s): Monitored during session         Frequency  Min 2X/week        Progress Toward Goals  OT Goals(current goals can now be found in the care plan section)  Progress towards OT goals: Progressing toward goals  Acute Rehab OT Goals Patient Stated Goal: go to rehab OT Goal Formulation: With patient Time For Goal Achievement: 09/01/21 Potential to Achieve Goals: Good ADL Goals Pt Will Perform Lower Body Dressing: sitting/lateral leans;with adaptive equipment;with mod assist Pt/caregiver will Perform Home Exercise Program: Increased strength;Both right and left upper extremity;Independently Additional ADL Goal #1: Patient will tolerate 15 minutes of OOB activity in order to participate in self care tasks and transfer training.  Plan Discharge plan remains appropriate       AM-PAC OT "6 Clicks" Daily Activity     Outcome Measure   Help from another person eating meals?: A Little Help from another person taking care of personal grooming?: A Little Help from another person toileting, which includes using toliet, bedpan, or urinal?: A Lot Help from another person bathing (including washing, rinsing, drying)?: A Lot Help from another person to put on and taking off regular upper body clothing?: A Little Help from another person to put on and taking off regular lower body clothing?: Total 6 Click Score: 14    End of Session Equipment Utilized During Treatment: Gait belt;Rolling walker (2 wheels)  OT Visit Diagnosis: Other abnormalities of gait and mobility (R26.89);History of falling (Z91.81);Muscle weakness (generalized) (M62.81)   Activity Tolerance  Patient tolerated treatment well   Patient Left in chair;with call bell/phone within reach;with chair alarm set   Nurse Communication Mobility status        Time: 1751-0258 OT Time Calculation (min): 30 min  Charges: OT General Charges $OT Visit: 1 Visit OT Treatments $Self Care/Home Management : 23-37 mins  Marlyce Huge OT OT pager: [phone]  Carmelia Roller 08/28/2021, 1:52 PM

## 2021-08-29 LAB — CBC
HCT: 26.3 % — ABNORMAL LOW (ref 36.0–46.0)
Hemoglobin: 7.6 g/dL — ABNORMAL LOW (ref 12.0–15.0)
MCH: 25.6 pg — ABNORMAL LOW (ref 26.0–34.0)
MCHC: 28.9 g/dL — ABNORMAL LOW (ref 30.0–36.0)
MCV: 88.6 fL (ref 80.0–100.0)
Platelets: 476 10*3/uL — ABNORMAL HIGH (ref 150–400)
RBC: 2.97 MIL/uL — ABNORMAL LOW (ref 3.87–5.11)
RDW: 23.5 % — ABNORMAL HIGH (ref 11.5–15.5)
WBC: 8.3 10*3/uL (ref 4.0–10.5)
nRBC: 0 % (ref 0.0–0.2)

## 2021-08-29 LAB — COMPREHENSIVE METABOLIC PANEL
ALT: 23 U/L (ref 0–44)
AST: 15 U/L (ref 15–41)
Albumin: 2.6 g/dL — ABNORMAL LOW (ref 3.5–5.0)
Alkaline Phosphatase: 57 U/L (ref 38–126)
Anion gap: 6 (ref 5–15)
BUN: 22 mg/dL (ref 8–23)
CO2: 37 mmol/L — ABNORMAL HIGH (ref 22–32)
Calcium: 8.4 mg/dL — ABNORMAL LOW (ref 8.9–10.3)
Chloride: 95 mmol/L — ABNORMAL LOW (ref 98–111)
Creatinine, Ser: 0.59 mg/dL (ref 0.44–1.00)
GFR, Estimated: >60 mL/min (ref >60)
Glucose, Bld: 156 mg/dL — ABNORMAL HIGH (ref 70–99)
Potassium: 4 mmol/L (ref 3.5–5.1)
Sodium: 138 mmol/L (ref 135–145)
Total Bilirubin: 0.4 mg/dL (ref 0.3–1.2)
Total Protein: 5.6 g/dL — ABNORMAL LOW (ref 6.5–8.1)

## 2021-08-29 LAB — GLUCOSE, CAPILLARY
Glucose-Capillary: 143 mg/dL — ABNORMAL HIGH (ref 70–99)
Glucose-Capillary: 109 mg/dL — ABNORMAL HIGH (ref 70–99)
Glucose-Capillary: 150 mg/dL — ABNORMAL HIGH (ref 70–99)
Glucose-Capillary: 190 mg/dL — ABNORMAL HIGH (ref 70–99)

## 2021-08-29 MED ORDER — FUROSEMIDE 10 MG/ML IJ SOLN
40.0000 mg | Freq: Once | INTRAMUSCULAR | Status: AC
  Administered 2021-08-29: 40 mg via INTRAVENOUS
  Filled 2021-08-29: qty 4

## 2021-08-29 NOTE — Progress Notes (Signed)
PROGRESS NOTE    [name]  RWE:[id number] DOB: 1955-09-08 DOA: 08/14/2021 PCP: Rodrigo Ran, MD    Brief Narrative:  66 y.o. female with a history of COPD on 2 L oxygen, GERD, chronic pain, anemia. Patient was admitted for septic shock from leg wound. She required vasopressor support. Blood cultures with no growth. ID consulted for help with antibiotic management. Vasopressor support weaned off. Antibiotics discontinued.  Assessment & Plan:   Principal Problem:   Sepsis (HCC) Active Problems:   Septic shock (HCC)   DNR (do not resuscitate)   Pressure injury of skin  Septic shock Secondary to RLE cellulitis. Patient required vasopressor support, stress dose steroids and midodrine. Vasopressors weaned off. -Patient's bp has remained persistently low, requiring intermittent fluid boluses -Per PCCM, goal to keep MAP >55 -Given gross anasarca, question pt's ability to absorb PO meds including steroids. Currently on trial of IV hydrocortisone to allow BP to diureses -BP now more stable   RLE cellulitis Patient empirically started on Vancomycin and Zosyn. ID consulted with current recommendations to discontinue antibiotics. Wound care consulted Cellulitis much improved   Hypotension Persistent. In setting of septic shock but has remained persistent after treatment of infection. PCCM recommending map goal of >55. Patient is currently managed on midodrine with florinef -Per above, still requiring NS boluses intermittently -Given anasarca, question pt's ability to absorb PO meds -Cont on trial of IV hydrocortisone to allow BP to diurese -BP now improving   Acute on chronic respiratory failure with hypoxia Currently at baseline oxygen   COPD -Continue Duonebs -Stable   Anxiety -Continue Wellbutrin, Effexor and gabapentin -Continue Xanax   Hypothyroidism -Continue Synthroid -Most recent TSH from 1/16 of 3.003   Chronic opiate use -Continue oxycodone prn    Hyponatremia Mild. Asymptomatic. Resolved.   GERD -Continue Protonix   Anemia of chronic disease Stable. -Iron panel   Hyperglycemia Hemoglobin A1C of 5.6; does not meet criteria for prediabetes.  -Discontinued Levemir this visit -On 2 units short acting insulin before meals   Failure to thrive Severe malnutrition Dietitian recommendations (1/17): will order Ensure Enlive BID, each supplement provides 350 kcal and 20 grams of protein   will order 1 packet Juven BID, each packet provides 95 calories, 2.5 grams of protein (collagen), and 9.8 grams of carbohydrate (3 grams sugar); also contains 7 grams of L-arginine and L-glutamine, 300 mg vitamin C, 15 mg vitamin E, 1.2 mcg vitamin B-12, 9.5 mg zinc, 200 mg calcium, and 1.5 g  Calcium Beta-hydroxy-Beta-methylbutyrate to support wound healing.   will follow-up 1/18 for day #2 Calorie Count results.   Poor social situation Bedbugs and fleas reported in the house -Recs for SNF at time of d/c   Pressure injury Left/medial buttock, right/posterior buttock, posterior thigh. Present on admission.   Anasarca -Likely secondary to volume resuscitation in setting of hypoalbuminemia -BP improved, tolerating trial of lasix -UE neg for DVT -edema improving  DVT prophylaxis: Lovenox subq Code Status: DNR Family Communication: Pt in room, family not at bedside  Status is: Inpatient  Remains inpatient appropriate because: Severity of illness   Consultants:  PCCM ID  Procedures:  TTE  Antimicrobials: Anti-infectives (From admission, onward)    Start     Dose/Rate Route Frequency Ordered Stop   08/15/21 2200  vancomycin (VANCOREADY) IVPB 1250 mg/250 mL  Status:  Discontinued        1,250 mg 166.7 mL/hr over 90 Minutes Intravenous Every 24 hours 08/15/21 0154 08/24/21 1237   08/15/21 1600  piperacillin-tazobactam (ZOSYN) IVPB 3.375 g  Status:  Discontinued        3.375 g 12.5 mL/hr over 240 Minutes Intravenous Every 8 hours  08/15/21 1549 08/25/21 1506   08/15/21 1400  metroNIDAZOLE (FLAGYL) IVPB 500 mg  Status:  Discontinued        500 mg 100 mL/hr over 60 Minutes Intravenous Every 12 hours 08/15/21 1229 08/15/21 1549   08/15/21 0445  ceFEPIme (MAXIPIME) 2 g in sodium chloride 0.9 % 100 mL IVPB  Status:  Discontinued        2 g 200 mL/hr over 30 Minutes Intravenous Every 8 hours 08/15/21 0442 08/15/21 1549   08/15/21 0015  vancomycin (VANCOCIN) IVPB 1000 mg/200 mL premix        1,000 mg 200 mL/hr over 60 Minutes Intravenous  Once 08/15/21 0006 08/15/21 0150   08/14/21 2300  vancomycin (VANCOREADY) IVPB 750 mg/150 mL  Status:  Discontinued        750 mg 150 mL/hr over 60 Minutes Intravenous  Once 08/14/21 2246 08/15/21 0006       Subjective: States feeling better  Objective: Vitals:   08/29/21 0700 08/29/21 0729 08/29/21 0730 08/29/21 1145  BP: (!) 99/54   (!) 115/53  Pulse: 89   99  Resp: 10   (!) 39  Temp:   97.9 F (36.6 C)   TempSrc:   Oral   SpO2: 96% 98%  96%  Weight:      Height:        Intake/Output Summary (Last 24 hours) at 08/29/2021 1352 Last data filed at 08/29/2021 0825 Gross per 24 hour  Intake 0 ml  Output 2900 ml  Net -2900 ml    Filed Weights   08/27/21 0500 08/28/21 0500 08/29/21 0549  Weight: 98.1 kg 96.2 kg 97 kg    Examination: General exam: Conversant, in no acute distress Respiratory system: normal chest rise, clear, no audible wheezing Cardiovascular system: regular rhythm, s1-s2 Gastrointestinal system: Nondistended, nontender, pos BS Central nervous system: No seizures, no tremors Extremities: No cyanosis, no joint deformities Skin: No rashes, no pallor Psychiatry: Affect normal // no auditory hallucinations    Data Reviewed: I have personally reviewed following labs and imaging studies  CBC: Recent Labs  Lab 08/24/21 0404 08/26/21 0643 08/27/21 0516 08/28/21 0514 08/29/21 0600  WBC 10.4 8.4 10.0 14.6* 8.3  HGB 8.1* 8.0* 7.5* 7.7* 7.6*  HCT  28.1* 28.0* 24.8* 26.5* 26.3*  MCV 89.2 88.6 87.3 88.0 88.6  PLT 445* 402* 374 408* 476*    Basic Metabolic Panel: Recent Labs  Lab 08/24/21 0404 08/25/21 1851 08/26/21 0643 08/27/21 0516 08/28/21 0514 08/29/21 0600  NA 136  --  135 135 136 138  K 3.9  --  3.6 3.8 3.4* 4.0  CL 94*  --  92* 94* 95* 95*  CO2 38*  --  37* 36* 35* 37*  GLUCOSE 157* 76 98 101* 173* 156*  BUN 28*  --  26* 23 20 22   CREATININE 0.70  --  0.70 0.65 0.64 0.59  CALCIUM 8.4*  --  7.9* 7.7* 8.1* 8.4*    GFR: Estimated Creatinine Clearance: 79.2 mL/min (by C-G formula based on SCr of 0.59 mg/dL). Liver Function Tests: Recent Labs  Lab 08/27/21 0516 08/28/21 0514 08/29/21 0600  AST 16 19 15   ALT 27 23 23   ALKPHOS 54 49 57  BILITOT 0.5 0.2* 0.4  PROT 4.7* 5.4* 5.6*  ALBUMIN 2.2* 2.8* 2.6*    No results for  input(s): LIPASE, AMYLASE in the last 168 hours. No results for input(s): AMMONIA in the last 168 hours. Coagulation Profile: No results for input(s): INR, PROTIME in the last 168 hours. Cardiac Enzymes: No results for input(s): CKTOTAL, CKMB, CKMBINDEX, TROPONINI in the last 168 hours. BNP (last 3 results) No results for input(s): PROBNP in the last 8760 hours. HbA1C: No results for input(s): HGBA1C in the last 72 hours. CBG: Recent Labs  Lab 08/28/21 1236 08/28/21 1626 08/28/21 2132 08/29/21 0805 08/29/21 1152  GLUCAP 125* 129* 142* 143* 109*    Lipid Profile: No results for input(s): CHOL, HDL, LDLCALC, TRIG, CHOLHDL, LDLDIRECT in the last 72 hours. Thyroid Function Tests: No results for input(s): TSH, T4TOTAL, FREET4, T3FREE, THYROIDAB in the last 72 hours. Anemia Panel: No results for input(s): VITAMINB12, FOLATE, FERRITIN, TIBC, IRON, RETICCTPCT in the last 72 hours.  Sepsis Labs: No results for input(s): PROCALCITON, LATICACIDVEN in the last 168 hours.  No results found for this or any previous visit (from the past 240 hour(s)).   Radiology Studies: VAS US UPPER  EXTREMITY VENOUS DUPLEX  Result Date: 08/28/2021 UPPER VENOUS STUDY  Patient Name:  Jenene SlickerNA M Mencer  Date of Exam:   08/27/2021 Medical Rec #: [id number]      Accession #:    [id number] Date of Birth: Dec 10, 1955      Patient Gender: F Patient Age:   [age] years [facility] Procedure:      VAS US UPPER EXTREMITY VENOUS DUPLEX Referring Phys: Aqeel Norgaard --------------------------------------------------------------------------------  Indications: Edema Limitations: Line and bandages. Comparison Study: No previous exams Performing Technologist: Jody Hill RVT, RDMS  Examination Guidelines: A complete evaluation includes B-mode imaging, spectral Doppler, color Doppler, and power Doppler as needed of all accessible portions of each vessel. Bilateral testing is considered an integral part of a complete examination. Limited examinations for reoccurring indications may be performed as noted.  Right Findings: +----------+------------+---------+-----------+----------+-------+  RIGHT      Compressible Phasicity Spontaneous Properties Summary  +----------+------------+---------+-----------+----------+-------+  IJV            Full        Yes        Yes                         +----------+------------+---------+-----------+----------+-------+  Subclavian     Full        Yes        Yes                         +----------+------------+---------+-----------+----------+-------+  Axillary       Full        Yes        Yes                         +----------+------------+---------+-----------+----------+-------+  Brachial       Full        Yes        Yes                         +----------+------------+---------+-----------+----------+-------+  Radial         Full                                               +----------+------------+---------+-----------+----------+-------+  Ulnar          Full                                               +----------+------------+---------+-----------+----------+-------+  Cephalic        Full                                               +----------+------------+---------+-----------+----------+-------+  Basilic        Full        Yes        Yes                         +----------+------------+---------+-----------+----------+-------+ Brachial and basilic veins were difficult to visualize due to PICC line and bandage. Only one of paired brachial veins seen on this exam.  Left Findings: +----------+------------+---------+-----------+----------+-------+  LEFT       Compressible Phasicity Spontaneous Properties Summary  +----------+------------+---------+-----------+----------+-------+  Subclavian     Full        Yes        Yes                         +----------+------------+---------+-----------+----------+-------+  Summary:  Right: No evidence of deep vein thrombosis in the upper extremity. No evidence of superficial vein thrombosis in the upper extremity.  Left: No evidence of thrombosis in the subclavian.  *See table(s) above for measurements and observations.  Diagnosing physician: Heath Larkhomas Hawken Electronically signed by Heath Larkhomas Hawken on 08/28/2021 at 5:06:03 AM.    Final     Scheduled Meds:  aspirin  81 mg Oral Daily   buPROPion  150 mg Oral QPM   Chlorhexidine Gluconate Cloth  6 each Topical Daily   collagenase   Topical Daily   enoxaparin (LOVENOX) injection  40 mg Subcutaneous Q24H   feeding supplement  237 mL Oral TID BM   gabapentin  300 mg Oral TID   hydrocortisone sod succinate (SOLU-CORTEF) inj  75 mg Intravenous Q12H   influenza vac split quadrivalent PF  0.5 mL Intramuscular Tomorrow-1000   insulin aspart  0-9 Units Subcutaneous TID WC   ipratropium-albuterol  3 mL Nebulization BID   levothyroxine  150 mcg Oral Q0600   mouth rinse  15 mL Mouth Rinse BID   midodrine  15 mg Oral TID WC   nutrition supplement (JUVEN)  1 packet Oral BID BM   pantoprazole  40 mg Oral Daily   polyethylene glycol  17 g Oral BID   senna  1 tablet Oral Daily   sodium chloride flush   10-40 mL Intracatheter Q12H   sodium hypochlorite   Irrigation BID   venlafaxine XR  150 mg Oral QHS   Continuous Infusions:  sodium chloride Stopped (08/26/21 1725)     LOS: 14 days   Rickey BarbaraStephen Shakeria Robinette, MD [facility] Pager On Amion  If 7PM-7AM, please contact night-coverage 08/29/2021, 1:52 PM

## 2021-08-29 NOTE — Plan of Care (Signed)

## 2021-08-30 LAB — GLUCOSE, CAPILLARY
Glucose-Capillary: 140 mg/dL — ABNORMAL HIGH (ref 70–99)
Glucose-Capillary: 132 mg/dL — ABNORMAL HIGH (ref 70–99)
Glucose-Capillary: 108 mg/dL — ABNORMAL HIGH (ref 70–99)
Glucose-Capillary: 91 mg/dL (ref 70–99)
Glucose-Capillary: 103 mg/dL — ABNORMAL HIGH (ref 70–99)

## 2021-08-30 LAB — COMPREHENSIVE METABOLIC PANEL
ALT: 20 U/L (ref 0–44)
AST: 16 U/L (ref 15–41)
Albumin: 2.8 g/dL — ABNORMAL LOW (ref 3.5–5.0)
Alkaline Phosphatase: 67 U/L (ref 38–126)
Anion gap: 8 (ref 5–15)
BUN: 25 mg/dL — ABNORMAL HIGH (ref 8–23)
CO2: 34 mmol/L — ABNORMAL HIGH (ref 22–32)
Calcium: 8.3 mg/dL — ABNORMAL LOW (ref 8.9–10.3)
Chloride: 93 mmol/L — ABNORMAL LOW (ref 98–111)
Creatinine, Ser: 0.48 mg/dL (ref 0.44–1.00)
GFR, Estimated: >60 mL/min (ref >60)
Glucose, Bld: 134 mg/dL — ABNORMAL HIGH (ref 70–99)
Potassium: 3.6 mmol/L (ref 3.5–5.1)
Sodium: 135 mmol/L (ref 135–145)
Total Bilirubin: 0.4 mg/dL (ref 0.3–1.2)
Total Protein: 5.8 g/dL — ABNORMAL LOW (ref 6.5–8.1)

## 2021-08-30 LAB — CBC
HCT: 26.6 % — ABNORMAL LOW (ref 36.0–46.0)
Hemoglobin: 7.7 g/dL — ABNORMAL LOW (ref 12.0–15.0)
MCH: 26 pg (ref 26.0–34.0)
MCHC: 28.9 g/dL — ABNORMAL LOW (ref 30.0–36.0)
MCV: 89.9 fL (ref 80.0–100.0)
Platelets: 529 10*3/uL — ABNORMAL HIGH (ref 150–400)
RBC: 2.96 MIL/uL — ABNORMAL LOW (ref 3.87–5.11)
RDW: 23.2 % — ABNORMAL HIGH (ref 11.5–15.5)
WBC: 9.1 10*3/uL (ref 4.0–10.5)
nRBC: 0 % (ref 0.0–0.2)

## 2021-08-30 MED ORDER — HYDROCORTISONE SOD SUC (PF) 100 MG IJ SOLR
75.0000 mg | Freq: Every day | INTRAMUSCULAR | Status: DC
  Administered 2021-08-31 – 2021-09-02 (×3): 75 mg via INTRAVENOUS
  Filled 2021-08-30 (×3): qty 2

## 2021-08-30 MED ORDER — FUROSEMIDE 10 MG/ML IJ SOLN
40.0000 mg | Freq: Once | INTRAMUSCULAR | Status: AC
  Administered 2021-08-30: 40 mg via INTRAVENOUS
  Filled 2021-08-30: qty 4

## 2021-08-30 NOTE — Progress Notes (Signed)
PROGRESS NOTE    [name]  DXI:[id number] DOB: 03-11-1956 DOA: 08/14/2021 PCP: Rodrigo Ran, MD    Brief Narrative:  66 y.o. female with a history of COPD on 2 L oxygen, GERD, chronic pain, anemia. Patient was admitted for septic shock from leg wound. She required vasopressor support. Blood cultures with no growth. ID consulted for help with antibiotic management. Vasopressor support weaned off. Antibiotics discontinued.  Assessment & Plan:   Principal Problem:   Sepsis (HCC) Active Problems:   Septic shock (HCC)   DNR (do not resuscitate)   Pressure injury of skin  Septic shock Secondary to RLE cellulitis. Patient required vasopressor support, stress dose steroids and midodrine. Vasopressors weaned off. Resolved -Patient's bp has remained persistently low, requiring intermittent fluid boluses -Per PCCM, goal to keep MAP >55 -Given gross anasarca, question pt's ability to absorb PO meds including steroids. Currently on trial of IV hydrocortisone to allow BP to diureses -BP presently stable. Will begin weaning hydrocortisone   RLE cellulitis Patient empirically started on Vancomycin and Zosyn. ID consulted with current recommendations to discontinue antibiotics. Wound care consulted Cellulitis much improved   Hypotension Persistent. In setting of septic shock but has remained persistent after treatment of infection. PCCM recommending map goal of >55. Patient is currently managed on midodrine with florinef -Per above, still requiring NS boluses intermittently -Given anasarca, question pt's ability to absorb PO meds -Cont on trial of IV hydrocortisone to allow BP to diurese -BP has improved   Acute on chronic respiratory failure with hypoxia Currently at baseline oxygen   COPD -Continue Duonebs -Stable   Anxiety -Continue Wellbutrin, Effexor and gabapentin -Continue Xanax   Hypothyroidism -Continue Synthroid -Most recent TSH from 1/16 of 3.003   Chronic opiate  use -Continue oxycodone prn   Hyponatremia Mild. Asymptomatic. Resolved.   GERD -Continue Protonix   Anemia of chronic disease Stable. -Iron panel   Hyperglycemia Hemoglobin A1C of 5.6; does not meet criteria for prediabetes.  -Discontinued Levemir this visit On SSI coverage   Failure to thrive Severe malnutrition Dietitian recommendations (1/17): will order Ensure Enlive BID, each supplement provides 350 kcal and 20 grams of protein   will order 1 packet Juven BID, each packet provides 95 calories, 2.5 grams of protein (collagen), and 9.8 grams of carbohydrate (3 grams sugar); also contains 7 grams of L-arginine and L-glutamine, 300 mg vitamin C, 15 mg vitamin E, 1.2 mcg vitamin B-12, 9.5 mg zinc, 200 mg calcium, and 1.5 g  Calcium Beta-hydroxy-Beta-methylbutyrate to support wound healing.   Poor social situation Bedbugs and fleas reported in the house -Recs for SNF at time of d/c   Pressure injury Left/medial buttock, right/posterior buttock, posterior thigh. Present on admission.   Anasarca -Likely secondary to volume resuscitation in setting of hypoalbuminemia -BP improved, tolerating trial of lasix -UE neg for DVT -edema improving with diuresis  DVT prophylaxis: Lovenox subq Code Status: DNR Family Communication: Pt in room, family not at bedside  Status is: Inpatient  Remains inpatient appropriate because: Severity of illness   Consultants:  PCCM ID  Procedures:  TTE  Antimicrobials: Anti-infectives (From admission, onward)    Start     Dose/Rate Route Frequency Ordered Stop   08/15/21 2200  vancomycin (VANCOREADY) IVPB 1250 mg/250 mL  Status:  Discontinued        1,250 mg 166.7 mL/hr over 90 Minutes Intravenous Every 24 hours 08/15/21 0154 08/24/21 1237   08/15/21 1600  piperacillin-tazobactam (ZOSYN) IVPB 3.375 g  Status:  Discontinued  3.375 g 12.5 mL/hr over 240 Minutes Intravenous Every 8 hours 08/15/21 1549 08/25/21 1506   08/15/21 1400   metroNIDAZOLE (FLAGYL) IVPB 500 mg  Status:  Discontinued        500 mg 100 mL/hr over 60 Minutes Intravenous Every 12 hours 08/15/21 1229 08/15/21 1549   08/15/21 0445  ceFEPIme (MAXIPIME) 2 g in sodium chloride 0.9 % 100 mL IVPB  Status:  Discontinued        2 g 200 mL/hr over 30 Minutes Intravenous Every 8 hours 08/15/21 0442 08/15/21 1549   08/15/21 0015  vancomycin (VANCOCIN) IVPB 1000 mg/200 mL premix        1,000 mg 200 mL/hr over 60 Minutes Intravenous  Once 08/15/21 0006 08/15/21 0150   08/14/21 2300  vancomycin (VANCOREADY) IVPB 750 mg/150 mL  Status:  Discontinued        750 mg 150 mL/hr over 60 Minutes Intravenous  Once 08/14/21 2246 08/15/21 0006       Subjective: Reports feeling better. Edema improving.  Objective: Vitals:   08/30/21 0600 08/30/21 0739 08/30/21 0746 08/30/21 1229  BP: (!) 119/55     Pulse: 88     Resp: 10     Temp:   98.3 F (36.8 C) 98.1 F (36.7 C)  TempSrc:   Oral Oral  SpO2: 95% 97%    Weight:      Height:        Intake/Output Summary (Last 24 hours) at 08/30/2021 1310 Last data filed at 08/30/2021 0600 Gross per 24 hour  Intake 200 ml  Output 2600 ml  Net -2400 ml    Filed Weights   08/28/21 0500 08/29/21 0549 08/30/21 0500  Weight: 96.2 kg 97 kg 97.4 kg    Examination: General exam: Awake, laying in bed, in nad Respiratory system: Normal respiratory effort, no wheezing Cardiovascular system: regular rate, s1, s2 Gastrointestinal system: Soft, nondistended, positive BS Central nervous system: CN2-12 grossly intact, strength intact Extremities: Perfused, no clubbing Skin: Normal skin turgor, no notable skin lesions seen Psychiatry: Mood normal // no visual hallucinations   Data Reviewed: I have personally reviewed following labs and imaging studies  CBC: Recent Labs  Lab 08/26/21 0643 08/27/21 0516 08/28/21 0514 08/29/21 0600 08/30/21 0500  WBC 8.4 10.0 14.6* 8.3 9.1  HGB 8.0* 7.5* 7.7* 7.6* 7.7*  HCT 28.0* 24.8*  26.5* 26.3* 26.6*  MCV 88.6 87.3 88.0 88.6 89.9  PLT 402* 374 408* 476* 529*    Basic Metabolic Panel: Recent Labs  Lab 08/26/21 0643 08/27/21 0516 08/28/21 0514 08/29/21 0600 08/30/21 0500  NA 135 135 136 138 135  K 3.6 3.8 3.4* 4.0 3.6  CL 92* 94* 95* 95* 93*  CO2 37* 36* 35* 37* 34*  GLUCOSE 98 101* 173* 156* 134*  BUN 26* 23 20 22  25*  CREATININE 0.70 0.65 0.64 0.59 0.48  CALCIUM 7.9* 7.7* 8.1* 8.4* 8.3*    GFR: Estimated Creatinine Clearance: 79.5 mL/min (by C-G formula based on SCr of 0.48 mg/dL). Liver Function Tests: Recent Labs  Lab 08/27/21 0516 08/28/21 0514 08/29/21 0600 08/30/21 0500  AST 16 19 15 16   ALT 27 23 23 20   ALKPHOS 54 49 57 67  BILITOT 0.5 0.2* 0.4 0.4  PROT 4.7* 5.4* 5.6* 5.8*  ALBUMIN 2.2* 2.8* 2.6* 2.8*    No results for input(s): LIPASE, AMYLASE in the last 168 hours. No results for input(s): AMMONIA in the last 168 hours. Coagulation Profile: No results for input(s): INR, PROTIME in  the last 168 hours. Cardiac Enzymes: No results for input(s): CKTOTAL, CKMB, CKMBINDEX, TROPONINI in the last 168 hours. BNP (last 3 results) No results for input(s): PROBNP in the last 8760 hours. HbA1C: No results for input(s): HGBA1C in the last 72 hours. CBG: Recent Labs  Lab 08/29/21 1152 08/29/21 1559 08/29/21 2156 08/30/21 0717 08/30/21 1141  GLUCAP 109* 150* 190* 140* 132*    Lipid Profile: No results for input(s): CHOL, HDL, LDLCALC, TRIG, CHOLHDL, LDLDIRECT in the last 72 hours. Thyroid Function Tests: No results for input(s): TSH, T4TOTAL, FREET4, T3FREE, THYROIDAB in the last 72 hours. Anemia Panel: No results for input(s): VITAMINB12, FOLATE, FERRITIN, TIBC, IRON, RETICCTPCT in the last 72 hours.  Sepsis Labs: No results for input(s): PROCALCITON, LATICACIDVEN in the last 168 hours.  No results found for this or any previous visit (from the past 240 hour(s)).   Radiology Studies: No results found.  Scheduled Meds:   aspirin  81 mg Oral Daily   buPROPion  150 mg Oral QPM   Chlorhexidine Gluconate Cloth  6 each Topical Daily   collagenase   Topical Daily   enoxaparin (LOVENOX) injection  40 mg Subcutaneous Q24H   feeding supplement  237 mL Oral TID BM   gabapentin  300 mg Oral TID   [START ON 08/31/2021] hydrocortisone sod succinate (SOLU-CORTEF) inj  75 mg Intravenous Daily   influenza vac split quadrivalent PF  0.5 mL Intramuscular Tomorrow-1000   insulin aspart  0-9 Units Subcutaneous TID WC   ipratropium-albuterol  3 mL Nebulization BID   levothyroxine  150 mcg Oral Q0600   mouth rinse  15 mL Mouth Rinse BID   midodrine  15 mg Oral TID WC   nutrition supplement (JUVEN)  1 packet Oral BID BM   pantoprazole  40 mg Oral Daily   polyethylene glycol  17 g Oral BID   senna  1 tablet Oral Daily   sodium chloride flush  10-40 mL Intracatheter Q12H   sodium hypochlorite   Irrigation BID   venlafaxine XR  150 mg Oral QHS   Continuous Infusions:  sodium chloride Stopped (08/26/21 1725)     LOS: 15 days   Rickey BarbaraStephen Callie Bunyard, MD [facility] Pager On Amion  If 7PM-7AM, please contact night-coverage 08/30/2021, 1:10 PM

## 2021-08-31 LAB — CBC
HCT: 26.4 % — ABNORMAL LOW (ref 36.0–46.0)
Hemoglobin: 7.7 g/dL — ABNORMAL LOW (ref 12.0–15.0)
MCH: 25.5 pg — ABNORMAL LOW (ref 26.0–34.0)
MCHC: 29.2 g/dL — ABNORMAL LOW (ref 30.0–36.0)
MCV: 87.4 fL (ref 80.0–100.0)
Platelets: 506 10*3/uL — ABNORMAL HIGH (ref 150–400)
RBC: 3.02 MIL/uL — ABNORMAL LOW (ref 3.87–5.11)
RDW: 22.7 % — ABNORMAL HIGH (ref 11.5–15.5)
WBC: 7.9 10*3/uL (ref 4.0–10.5)
nRBC: 0.5 % — ABNORMAL HIGH (ref 0.0–0.2)

## 2021-08-31 LAB — COMPREHENSIVE METABOLIC PANEL
ALT: 22 U/L (ref 0–44)
AST: 19 U/L (ref 15–41)
Albumin: 2.5 g/dL — ABNORMAL LOW (ref 3.5–5.0)
Alkaline Phosphatase: 69 U/L (ref 38–126)
Anion gap: 7 (ref 5–15)
BUN: 27 mg/dL — ABNORMAL HIGH (ref 8–23)
CO2: 35 mmol/L — ABNORMAL HIGH (ref 22–32)
Calcium: 8.2 mg/dL — ABNORMAL LOW (ref 8.9–10.3)
Chloride: 91 mmol/L — ABNORMAL LOW (ref 98–111)
Creatinine, Ser: 0.67 mg/dL (ref 0.44–1.00)
GFR, Estimated: >60 mL/min (ref >60)
Glucose, Bld: 93 mg/dL (ref 70–99)
Potassium: 3.8 mmol/L (ref 3.5–5.1)
Sodium: 133 mmol/L — ABNORMAL LOW (ref 135–145)
Total Bilirubin: 0.6 mg/dL (ref 0.3–1.2)
Total Protein: 5.2 g/dL — ABNORMAL LOW (ref 6.5–8.1)

## 2021-08-31 LAB — GLUCOSE, CAPILLARY
Glucose-Capillary: 58 mg/dL — ABNORMAL LOW (ref 70–99)
Glucose-Capillary: 73 mg/dL (ref 70–99)
Glucose-Capillary: 137 mg/dL — ABNORMAL HIGH (ref 70–99)

## 2021-08-31 MED ORDER — FUROSEMIDE 10 MG/ML IJ SOLN
40.0000 mg | Freq: Once | INTRAMUSCULAR | Status: AC
  Administered 2021-08-31: 40 mg via INTRAVENOUS
  Filled 2021-08-31: qty 4

## 2021-08-31 MED ORDER — JUVEN PO PACK
1.0000 | PACK | Freq: Every day | ORAL | Status: DC
  Administered 2021-09-01 – 2021-09-04 (×4): 1 via ORAL
  Filled 2021-08-31 (×4): qty 1

## 2021-08-31 MED ORDER — ENSURE ENLIVE PO LIQD
237.0000 mL | ORAL | Status: DC
  Administered 2021-09-01: 237 mL via ORAL
  Administered 2021-09-02: 120 mL via ORAL
  Administered 2021-09-03: 237 mL via ORAL

## 2021-08-31 NOTE — TOC PASRR Note (Signed)
30 Day Passar Note  RE: [name] Date of Birth:[birth year] Date:08/31/2021  To Whom It May Concern:  Please be advised that the above-named patient will require a short-term nursing home stay - anticipated 30 days or less for rehabilitation and strengthening.  The plan is for return home.   Windell Moulding, MSW, [place] [phone]

## 2021-08-31 NOTE — Progress Notes (Signed)
NUTRITION NOTE  RN alerted RD that patient is now on a 1.5 L fluid restriction. Patient is ordered Ensure TID and Juven BID. This provides 1183 ml/day.   RN shares that patient continues to eat well and that she enjoys both supplements. Will decrease each for patient to receive Ensure once/day and Juven once/day.   RD will continue to follow per protocol.      Trenton Gammon, MS, RD, LDN Inpatient Clinical Dietitian RD pager # available in AMION  After hours/weekend pager # available in [facility]

## 2021-08-31 NOTE — Progress Notes (Signed)
Chaplain engaged in an initial visit with [name].  Chaplain checked in with her and offered support.  She noted she was doing well and was trying to order her food.  Chaplain offered to assist her but she wanted to complete that task on her on.  Chaplain again offered support.  Chaplain will follow-up.     08/31/21 1200  Clinical Encounter Type  Visited With Patient  Visit Type Initial

## 2021-08-31 NOTE — Progress Notes (Signed)
PROGRESS NOTE    [name]  [id number] DOB: 12-22-1955 DOA: 08/14/2021 PCP: Crist Infante, MD    Brief Narrative:  66 y.o. female with a history of COPD on 2 L oxygen, GERD, chronic pain, anemia. Patient was admitted for septic shock from leg wound. She required vasopressor support. Blood cultures with no growth. ID consulted for help with antibiotic management. Vasopressor support weaned off. Antibiotics discontinued.  Assessment & Plan:   Principal Problem:   Sepsis ([place]) Active Problems:   Septic shock ([place])   DNR (do not resuscitate)   Pressure injury of skin  Septic shock Secondary to RLE cellulitis. Patient required vasopressor support, stress dose steroids and midodrine. Vasopressors weaned off. Resolved -Patient's bp has remained persistently low, requiring intermittent fluid boluses -Per PCCM, goal to keep MAP >55 -Given gross anasarca, question pt's ability to absorb PO meds including steroids. Currently weaning trial of IV hydrocortisone to allow BP to diureses -BP presently stable, cont to wean steroids as tolerated   RLE cellulitis Patient empirically started on Vancomycin and Zosyn. ID consulted with current recommendations to discontinue antibiotics. Wound care consulted Cellulitis much improved   Hypotension Persistent. In setting of septic shock but has remained persistent after treatment of infection. PCCM recommending map goal of >55. Patient is currently managed on midodrine with florinef -Per above, still requiring NS boluses intermittently -Given anasarca, question pt's ability to absorb PO meds -Cont on trial of IV hydrocortisone to allow BP to diurese -BP improved, weaning steroids   Acute on chronic respiratory failure with hypoxia Currently at baseline oxygen   COPD -Continue Duonebs -Stable   Anxiety -Continue Wellbutrin, Effexor and gabapentin -Continue Xanax   Hypothyroidism -Continue Synthroid -Most recent TSH from 1/16 of 3.003    Chronic opiate use -Continue oxycodone prn   Hyponatremia Mild. Asymptomatic. Possible related to vol overload Recheck bmet in AM   GERD -Continue Protonix   Anemia of chronic disease Stable. -Iron panel   Hyperglycemia Hemoglobin A1C of 5.6; does not meet criteria for prediabetes.  -Discontinued Levemir this visit -does not seem to need glycemic coverage, will hold ssi    Failure to thrive Severe malnutrition Dietitian recommendations (1/17): will order Ensure Enlive BID, each supplement provides 350 kcal and 20 grams of protein   will order 1 packet Juven BID, each packet provides 95 calories, 2.5 grams of protein (collagen), and 9.8 grams of carbohydrate (3 grams sugar); also contains 7 grams of L-arginine and L-glutamine, 300 mg vitamin C, 15 mg vitamin E, 1.2 mcg vitamin B-12, 9.5 mg zinc, 200 mg calcium, and 1.5 g  Calcium Beta-hydroxy-Beta-methylbutyrate to support wound healing.   Poor social situation Bedbugs and fleas reported in the house -Recs for SNF at time of d/c   Pressure injury Left/medial buttock, right/posterior buttock, posterior thigh. Present on admission.   Anasarca -Likely secondary to volume resuscitation in setting of hypoalbuminemia -BP improved, tolerating trial of lasix -UE neg for DVT -edema improving with diuresis  DVT prophylaxis: Lovenox subq Code Status: DNR Family Communication: Pt in room, family not at bedside  Status is: Inpatient  Remains inpatient appropriate because: Severity of illness   Consultants:  PCCM ID  Procedures:  TTE  Antimicrobials: Anti-infectives (From admission, onward)    Start     Dose/Rate Route Frequency Ordered Stop   08/15/21 2200  vancomycin (VANCOREADY) IVPB 1250 mg/250 mL  Status:  Discontinued        1,250 mg 166.7 mL/hr over 90 Minutes Intravenous Every 24  hours 08/15/21 0154 08/24/21 1237   08/15/21 1600  piperacillin-tazobactam (ZOSYN) IVPB 3.375 g  Status:  Discontinued         3.375 g 12.5 mL/hr over 240 Minutes Intravenous Every 8 hours 08/15/21 1549 08/25/21 1506   08/15/21 1400  metroNIDAZOLE (FLAGYL) IVPB 500 mg  Status:  Discontinued        500 mg 100 mL/hr over 60 Minutes Intravenous Every 12 hours 08/15/21 1229 08/15/21 1549   08/15/21 0445  ceFEPIme (MAXIPIME) 2 g in sodium chloride 0.9 % 100 mL IVPB  Status:  Discontinued        2 g 200 mL/hr over 30 Minutes Intravenous Every 8 hours 08/15/21 0442 08/15/21 1549   08/15/21 0015  vancomycin (VANCOCIN) IVPB 1000 mg/200 mL premix        1,000 mg 200 mL/hr over 60 Minutes Intravenous  Once 08/15/21 0006 08/15/21 0150   08/14/21 2300  vancomycin (VANCOREADY) IVPB 750 mg/150 mL  Status:  Discontinued        750 mg 150 mL/hr over 60 Minutes Intravenous  Once 08/14/21 2246 08/15/21 0006       Subjective: States eating well  Objective: Vitals:   08/31/21 0451 08/31/21 0500 08/31/21 0600 08/31/21 0852  BP: (!) 106/56 (!) 103/42 (!) 98/48   Pulse:  96 98   Resp:  13 16   Temp:      TempSrc:      SpO2:  94% 95% 95%  Weight:  95.9 kg    Height:        Intake/Output Summary (Last 24 hours) at 08/31/2021 1241 Last data filed at 08/31/2021 0857 Gross per 24 hour  Intake 967 ml  Output 2400 ml  Net -1433 ml    Filed Weights   08/29/21 0549 08/30/21 0500 08/31/21 0500  Weight: 97 kg 97.4 kg 95.9 kg    Examination: General exam: Conversant, in no acute distress Respiratory system: normal chest rise, clear, no audible wheezing Cardiovascular system: regular rhythm, s1-s2 Gastrointestinal system: Nondistended, nontender, pos BS Central nervous system: No seizures, no tremors Extremities: No cyanosis, no joint deformities, general anasarca improving Skin: No rashes, no pallor Psychiatry: Affect normal // no auditory hallucinations   Data Reviewed: I have personally reviewed following labs and imaging studies  CBC: Recent Labs  Lab 08/27/21 0516 08/28/21 0514 08/29/21 0600 08/30/21 0500  08/31/21 0456  WBC 10.0 14.6* 8.3 9.1 7.9  HGB 7.5* 7.7* 7.6* 7.7* 7.7*  HCT 24.8* 26.5* 26.3* 26.6* 26.4*  MCV 87.3 88.0 88.6 89.9 87.4  PLT 374 408* 476* 529* 506*    Basic Metabolic Panel: Recent Labs  Lab 08/27/21 0516 08/28/21 0514 08/29/21 0600 08/30/21 0500 08/31/21 0456  NA 135 136 138 135 133*  K 3.8 3.4* 4.0 3.6 3.8  CL 94* 95* 95* 93* 91*  CO2 36* 35* 37* 34* 35*  GLUCOSE 101* 173* 156* 134* 93  BUN 23 20 22  25* 27*  CREATININE 0.65 0.64 0.59 0.48 0.67  CALCIUM 7.7* 8.1* 8.4* 8.3* 8.2*    GFR: Estimated Creatinine Clearance: 78.8 mL/min (by C-G formula based on SCr of 0.67 mg/dL). Liver Function Tests: Recent Labs  Lab 08/27/21 0516 08/28/21 0514 08/29/21 0600 08/30/21 0500 08/31/21 0456  AST 16 19 15 16 19   ALT 27 23 23 20 22   ALKPHOS 54 49 57 67 69  BILITOT 0.5 0.2* 0.4 0.4 0.6  PROT 4.7* 5.4* 5.6* 5.8* 5.2*  ALBUMIN 2.2* 2.8* 2.6* 2.8* 2.5*    No  results for input(s): LIPASE, AMYLASE in the last 168 hours. No results for input(s): AMMONIA in the last 168 hours. Coagulation Profile: No results for input(s): INR, PROTIME in the last 168 hours. Cardiac Enzymes: No results for input(s): CKTOTAL, CKMB, CKMBINDEX, TROPONINI in the last 168 hours. BNP (last 3 results) No results for input(s): PROBNP in the last 8760 hours. HbA1C: No results for input(s): HGBA1C in the last 72 hours. CBG: Recent Labs  Lab 08/30/21 1549 08/30/21 2123 08/30/21 2132 08/31/21 0749 08/31/21 1239  GLUCAP 108* 91 103* 73   58* 137*    Lipid Profile: No results for input(s): CHOL, HDL, LDLCALC, TRIG, CHOLHDL, LDLDIRECT in the last 72 hours. Thyroid Function Tests: No results for input(s): TSH, T4TOTAL, FREET4, T3FREE, THYROIDAB in the last 72 hours. Anemia Panel: No results for input(s): VITAMINB12, FOLATE, FERRITIN, TIBC, IRON, RETICCTPCT in the last 72 hours.  Sepsis Labs: No results for input(s): PROCALCITON, LATICACIDVEN in the last 168 hours.  No results  found for this or any previous visit (from the past 240 hour(s)).   Radiology Studies: No results found.  Scheduled Meds:  aspirin  81 mg Oral Daily   buPROPion  150 mg Oral QPM   Chlorhexidine Gluconate Cloth  6 each Topical Daily   collagenase   Topical Daily   enoxaparin (LOVENOX) injection  40 mg Subcutaneous Q24H   feeding supplement  237 mL Oral TID BM   furosemide  40 mg Intravenous Once   gabapentin  300 mg Oral TID   hydrocortisone sod succinate (SOLU-CORTEF) inj  75 mg Intravenous Daily   influenza vac split quadrivalent PF  0.5 mL Intramuscular Tomorrow-1000   insulin aspart  0-9 Units Subcutaneous TID WC   ipratropium-albuterol  3 mL Nebulization BID   levothyroxine  150 mcg Oral Q0600   mouth rinse  15 mL Mouth Rinse BID   midodrine  15 mg Oral TID WC   nutrition supplement (JUVEN)  1 packet Oral BID BM   pantoprazole  40 mg Oral Daily   polyethylene glycol  17 g Oral BID   senna  1 tablet Oral Daily   sodium chloride flush  10-40 mL Intracatheter Q12H   sodium hypochlorite   Irrigation BID   venlafaxine XR  150 mg Oral QHS   Continuous Infusions:  sodium chloride Stopped (08/26/21 1725)     LOS: 16 days   Marylu Lund, MD [facility] Pager On Amion  If 7PM-7AM, please contact night-coverage 08/31/2021, 12:41 PM

## 2021-08-31 NOTE — Progress Notes (Signed)
Physical Therapy Treatment Patient Details Name: [name] MRN: [id number] DOB: Dec 18, 1955 Today's Date: 08/31/2021   History of Present Illness 66 y.o. female with a history of COPD on 2 L oxygen, GERD, chronic pain, anemia. Patient was admitted for septic shock from leg wound. She required vasopressor support. Blood cultures with no growth. ID consulted for help with antibiotic management. Vasopressor support weaned off. Antibiotics discontinued.    PT Comments    Pt called to nurses station stating that her [place] was not working.  Canister filled to rim and no longer sucking.  Pt found in a puddle of urine.  Rolled several times for peri care/hygiene.  Dressings also soaked in urine.  Pt c/o increased pain.  Assisted with dressing change and RN gave pain meds.  Assisted to EOB.  Static sitting at Supervision x 3 min.  BP 97/69, HR 99, 2lts nasal 100% and MAP 76.  Assisted to recliner via STEDY and + 2 assist.  General transfer comment: pt with increased self ability tor ise from elevated bed onto STEDY.  Static standing tolerance approx 45 sec with good upright posture.  Mild c/o dizzines.  MAP 67.  Moved to recliner and positioned to comfort.  Pt tolerated well.  "Feels good to get out of that bed".  Pt plans to D/C to SNF for ST Rehab.   Recommendations for follow up therapy are one component of a multi-disciplinary discharge planning process, led by the attending physician.  Recommendations may be updated based on patient status, additional functional criteria and insurance authorization.  Follow Up Recommendations  Skilled nursing-short term rehab (<3 hours/day)     Assistance Recommended at Discharge Frequent or constant Supervision/Assistance  Patient can return home with the following Two people to help with walking and/or transfers;A lot of help with bathing/dressing/bathroom;Assistance with cooking/housework;Assist for transportation;Direct supervision/assist for medications  management;Help with stairs or ramp for entrance;Direct supervision/assist for financial management   Equipment Recommendations  None recommended by PT    Recommendations for Other Services       Precautions / Restrictions Precautions Precautions: Fall Precaution Comments: right posterior thigh wound, right foot(plantar heel), buttock wounds Restrictions Weight Bearing Restrictions: No     Mobility  Bed Mobility Overal bed mobility: Needs Assistance Bed Mobility: Rolling, Supine to Sit Rolling: Mod assist   Supine to sit: Max assist     General bed mobility comments: increased self ability to roll side to side for peri care    Transfers Overall transfer level: Needs assistance   Transfers: Sit to/from Stand Sit to Stand: Min assist, +2 physical assistance, +2 safety/equipment           General transfer comment: pt with increased self ability torise from elevated bed onto STEDY.  Static standing tolerance approx 45 sec with good upright posture.  Mild c/o dizzines.  MAP 67 Transfer via Lift Equipment: Stedy  Ambulation/Gait               General Gait Details: Transfers onl;y this session, amb has yet tobe attempted   Stairs             Wheelchair Mobility    Modified Rankin (Stroke Patients Only)       Balance                                            Cognition Arousal/Alertness:  Awake/alert   Overall Cognitive Status: No family/caregiver present to determine baseline cognitive functioning                                 General Comments: very pleasant and cooperative, appears to have better insight this session stating she will go to rehab vs home        Exercises      General Comments        Pertinent Vitals/Pain Pain Assessment Pain Assessment: Faces Faces Pain Scale: Hurts even more Pain Location: R thigh Pain Descriptors / Indicators: Grimacing, Tender Pain Intervention(s): Monitored  during session, Patient requesting pain meds-RN notified, RN gave pain meds during session, Repositioned    Home Living                          Prior Function            PT Goals (current goals can now be found in the care plan section) Progress towards PT goals: Progressing toward goals    Frequency    Min 2X/week      PT Plan Current plan remains appropriate    Co-evaluation              AM-PAC PT "6 Clicks" Mobility   Outcome Measure  Help needed turning from your back to your side while in a flat bed without using bedrails?: A Lot Help needed moving from lying on your back to sitting on the side of a flat bed without using bedrails?: A Lot Help needed moving to and from a bed to a chair (including a wheelchair)?: A Lot Help needed standing up from a chair using your arms (e.g., wheelchair or bedside chair)?: Total Help needed to walk in hospital room?: Total Help needed climbing 3-5 steps with a railing? : Total 6 Click Score: 9    End of Session Equipment Utilized During Treatment: Gait belt;Oxygen Activity Tolerance: Patient tolerated treatment well Patient left: in chair;with call bell/phone within reach;with chair alarm set Nurse Communication: Mobility status PT Visit Diagnosis: Unsteadiness on feet (R26.81);Muscle weakness (generalized) (M62.81);Pain;History of falling (Z91.81) Pain - Right/Left: Right Pain - part of body: Leg     Time: 1510-1540 PT Time Calculation (min) (ACUTE ONLY): 30 min  Charges:  $Therapeutic Activity: 23-37 mins                     {Romanita Fager  PTA Acute  Rehabilitation Services Pager      [phone] Office      [phone]

## 2021-08-31 NOTE — TOC Progression Note (Addendum)
Transition of Care [facility]) - Progression Note    Patient Details  Name: BRENASIA MICKLE MRN: [id number] Date of Birth: 02-12-1956  [facility] of [facility]) CM/SW Contact  Ross Ludwig, [place] Phone Number: 08/31/2021, 2:56 PM  Clinical Narrative:     PT recommended SNF placement however patient does not have any bed offers yet.  CSW has sent information to SNFs in [place] and surrounding counties.  [facility] is a Level 2 Passar Screen, waiting for [facility] number.  CSW contacted [place] and spoke to [place] to see if they can accept patient for [facility] PT, RN, and social work.  She said to fax the face sheet, [place] orders, H and P, and progress notes to [phone].  CSW to fax information once they are available.  CSW requested HH PT, RN, and social work orders for patient.   Expected Discharge Plan: [place] Barriers to Discharge: Continued Medical Work up  Expected Discharge Plan and Services Expected Discharge Plan: [place]   Discharge Planning Services: CM Consult Post Acute Care Choice: Resumption of Svcs/PTA Provider Living arrangements for the past 2 months: Single Family Home                                       Social Determinants of Health (SDOH) Interventions    Readmission Risk Interventions No flowsheet data found.

## 2021-08-31 NOTE — NC FL2 (Addendum)
[place] LEVEL OF CARE SCREENING TOOL     IDENTIFICATION  Patient Name: [name] Birthdate: 07/11/1956 Sex: female Admission Date (Current Location): 08/14/2021  [place] and [place] Number:  Oval Linsey [id number] [place] and Address:  [facility],  [address], [place]      Provider Number: [id number]  Attending Physician Name and Address:  Donne Hazel, MD  Relative Name and Phone Number:  [name] Relative [phone]    hunt,eugene Friend [phone]    Current Level of Care: Hospital Recommended Level of Care: [place] Prior Approval Number:    Date Approved/Denied:   PASRR Number: Pending  Discharge Plan: SNF    Current Diagnoses: Patient Active Problem List   Diagnosis Date Noted   Pressure injury of skin 08/17/2021   Sepsis ([place]) 08/15/2021   Septic shock ([place]) 08/15/2021   DNR (do not resuscitate) 08/15/2021   Cellulitis of right lower extremity    Wound cellulitis 06/16/2021   Hypothyroidism 06/16/2021   HLD (hyperlipidemia) 06/16/2021   Anxiety 06/16/2021   Fall at home, initial encounter 06/16/2021   Adult failure to thrive 06/16/2021   Arm mass, right 03/04/2021   Acute respiratory failure with hypoxia ([place]) 03/04/2021   COPD exacerbation ([place]) 03/01/2021   Acute exacerbation of chronic obstructive pulmonary disease (COPD) ([place]) 02/28/2021   Pyelonephritis 04/04/2018   Depression 04/04/2018   Venous insufficiency (chronic) (peripheral) 01/11/2018   BRONCHITIS, ACUTE 10/21/2008   Essential hypertension 07/13/2007   Asthma 07/11/2007   GERD (gastroesophageal reflux disease) 07/11/2007   SLEEP APNEA 07/11/2007   Shortness of breath 07/11/2007   COUGH 07/11/2007    Orientation RESPIRATION BLADDER Height & Weight     Self, Time, Place, Situation  O2 (2L) Continent Weight: 211 lb 6.7 oz (95.9 kg) Height:  5\' 4"  (162.6 cm)  BEHAVIORAL SYMPTOMS/MOOD NEUROLOGICAL BOWEL NUTRITION  STATUS      Continent Diet (Carb Modified)  AMBULATORY STATUS COMMUNICATION OF NEEDS Skin   Limited Assist Verbally PU Stage and Appropriate Care   PU Stage 2 Dressing: Daily                   Personal Care Assistance Level of Assistance  Bathing, Feeding, Dressing Bathing Assistance: Limited assistance Feeding assistance: Independent Dressing Assistance: Limited assistance     Functional Limitations Info  Sight, Hearing, Speech Sight Info: Adequate Hearing Info: Adequate Speech Info: Adequate    SPECIAL CARE FACTORS FREQUENCY  PT (By licensed PT), OT (By licensed OT)     PT Frequency: Minimum 5x a week OT Frequency: Minimum 5x a week            Contractures Contractures Info: Not present    Additional Factors Info  Code Status, Allergies, Psychotropic, Isolation Precautions Code Status Info: DNR Allergies Info: Gemfibrozil   Nitroglycerin   Other   Pravastatin   Sulfamethoxazole-trimethoprim   Tyloxapol Psychotropic Info: buPROPion (WELLBUTRIN XL) 24 hr tablet 150 mg, venlafaxine XR (EFFEXOR-XR) 24 hr capsule 150 mg   Isolation Precautions Info: 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     Current Medications (08/31/2021):  This is the current hospital active medication list Current Facility-Administered Medications  Medication Dose Route Frequency Provider Last Rate Last Admin   0.9 %  sodium chloride infusion  250 mL Intravenous Continuous Hunsucker, Bonna Gains, MD   Stopped at 08/26/21 1725   ALPRAZolam Duanne Moron) tablet 1 mg  1 mg Oral TID PRN Hunsucker, Bonna Gains, MD   1 mg  at 08/31/21 1725   aspirin chewable tablet 81 mg  81 mg Oral Daily Hunsucker, Bonna Gains, MD   81 mg at 08/31/21 [id number]   buPROPion (WELLBUTRIN XL) 24 hr tablet 150 mg  150 mg Oral QPM Hunsucker, Bonna Gains, MD   150 mg at 08/31/21 1725   Chlorhexidine Gluconate Cloth 2 % PADS 6 each  6 each Topical Daily Hunsucker, Bonna Gains, MD   6 each at 08/30/21 1008   collagenase (SANTYL) ointment   Topical Daily  Hunsucker, Bonna Gains, MD   Given at 08/30/21 1008   docusate sodium (COLACE) capsule 100 mg  100 mg Oral BID PRN Hunsucker, Bonna Gains, MD       enoxaparin (LOVENOX) injection 40 mg  40 mg Subcutaneous Q24H Hunsucker, Bonna Gains, MD   40 mg at 08/31/21 0532   [START ON 09/01/2021] feeding supplement (ENSURE ENLIVE / ENSURE PLUS) liquid 237 mL  237 mL Oral Q24H Donne Hazel, MD       fentaNYL (SUBLIMAZE) injection 50-100 mcg  50-100 mcg Intravenous BID PRN Hunsucker, Bonna Gains, MD   50 mcg at 08/31/21 [id number]   gabapentin (NEURONTIN) capsule 300 mg  300 mg Oral TID Hunsucker, Bonna Gains, MD   300 mg at 08/31/21 1725   hydrocortisone sodium succinate (SOLU-CORTEF) 100 MG injection 75 mg  75 mg Intravenous Daily Donne Hazel, MD   75 mg at 08/31/21 [id number]   influenza vac split quadrivalent PF (FLUARIX) injection 0.5 mL  0.5 mL Intramuscular Tomorrow-1000 Noemi Chapel P, DO       ipratropium-albuterol (DUONEB) 0.5-2.5 (3) MG/3ML nebulizer solution 3 mL  3 mL Nebulization BID Hunsucker, Bonna Gains, MD   3 mL at 08/31/21 [id number]   levothyroxine (SYNTHROID) tablet 150 mcg  150 mcg Oral Q0600 Lanier Clam, MD   150 mcg at 08/31/21 0532   magic mouthwash w/lidocaine  2 mL Oral TID PRN Hunsucker, Bonna Gains, MD   2 mL at 08/31/21 1728   MEDLINE mouth rinse  15 mL Mouth Rinse BID Hunsucker, Bonna Gains, MD   15 mL at 08/31/21 0923   midodrine (PROAMATINE) tablet 15 mg  15 mg Oral TID WC Hunsucker, Bonna Gains, MD   15 mg at 08/31/21 1725   [START ON 09/01/2021] nutrition supplement (JUVEN) (JUVEN) powder packet 1 packet  1 packet Oral Daily Donne Hazel, MD       ondansetron [facility]) injection 4 mg  4 mg Intravenous Q6H PRN Donne Hazel, MD       oxyCODONE (Oxy IR/ROXICODONE) immediate release tablet 10-15 mg  10-15 mg Oral Q4H PRN Hunsucker, Bonna Gains, MD   15 mg at 08/31/21 1521   pantoprazole (PROTONIX) EC tablet 40 mg  40 mg Oral Daily Hunsucker, Bonna Gains, MD   40 mg at 08/31/21 [id number]   polyethylene  glycol (MIRALAX / GLYCOLAX) packet 17 g  17 g Oral Daily PRN Hunsucker, Bonna Gains, MD       polyethylene glycol (MIRALAX / GLYCOLAX) packet 17 g  17 g Oral BID Mariel Aloe, MD   17 g at 08/31/21 [id number]   prochlorperazine (COMPAZINE) injection 10 mg  10 mg Intravenous Q6H PRN Hunsucker, Bonna Gains, MD   10 mg at 08/26/21 2129   senna (SENOKOT) tablet 8.6 mg  1 tablet Oral Daily Hunsucker, Bonna Gains, MD   8.6 mg at 08/31/21 [id number]   sodium chloride flush (NS) 0.9 % injection 10-40 mL  10-40 mL Intracatheter Q12H Hunsucker,  Bonna Gains, MD   30 mL at 08/31/21 [id number]   sodium chloride flush (NS) 0.9 % injection 10-40 mL  10-40 mL Intracatheter PRN Hunsucker, Bonna Gains, MD       sodium hypochlorite (DAKIN'S 1/4 STRENGTH) topical solution   Irrigation BID Donne Hazel, MD   Given at 08/30/21 2106   venlafaxine XR (EFFEXOR-XR) 24 hr capsule 150 mg  150 mg Oral QHS Hunsucker, Bonna Gains, MD   150 mg at 08/30/21 2106     Discharge Medications: Please see discharge summary for a list of discharge medications.  Relevant Imaging Results:  Relevant Lab Results:   Additional Information SSN [id number]  Ross Ludwig, LCSW

## 2021-09-01 LAB — COMPREHENSIVE METABOLIC PANEL
ALT: 28 U/L (ref 0–44)
AST: 22 U/L (ref 15–41)
Albumin: 2.6 g/dL — ABNORMAL LOW (ref 3.5–5.0)
Alkaline Phosphatase: 85 U/L (ref 38–126)
Anion gap: 7 (ref 5–15)
BUN: 24 mg/dL — ABNORMAL HIGH (ref 8–23)
CO2: 36 mmol/L — ABNORMAL HIGH (ref 22–32)
Calcium: 8.5 mg/dL — ABNORMAL LOW (ref 8.9–10.3)
Chloride: 92 mmol/L — ABNORMAL LOW (ref 98–111)
Creatinine, Ser: 0.65 mg/dL (ref 0.44–1.00)
GFR, Estimated: >60 mL/min (ref >60)
Glucose, Bld: 108 mg/dL — ABNORMAL HIGH (ref 70–99)
Potassium: 3.3 mmol/L — ABNORMAL LOW (ref 3.5–5.1)
Sodium: 135 mmol/L (ref 135–145)
Total Bilirubin: 0.4 mg/dL (ref 0.3–1.2)
Total Protein: 5.6 g/dL — ABNORMAL LOW (ref 6.5–8.1)

## 2021-09-01 LAB — CBC
HCT: 28.5 % — ABNORMAL LOW (ref 36.0–46.0)
Hemoglobin: 8.3 g/dL — ABNORMAL LOW (ref 12.0–15.0)
MCH: 25.5 pg — ABNORMAL LOW (ref 26.0–34.0)
MCHC: 29.1 g/dL — ABNORMAL LOW (ref 30.0–36.0)
MCV: 87.4 fL (ref 80.0–100.0)
Platelets: 538 10*3/uL — ABNORMAL HIGH (ref 150–400)
RBC: 3.26 MIL/uL — ABNORMAL LOW (ref 3.87–5.11)
RDW: 22.4 % — ABNORMAL HIGH (ref 11.5–15.5)
WBC: 8.3 10*3/uL (ref 4.0–10.5)
nRBC: 0.4 % — ABNORMAL HIGH (ref 0.0–0.2)

## 2021-09-01 MED ORDER — FENTANYL CITRATE PF 50 MCG/ML IJ SOSY
50.0000 ug | PREFILLED_SYRINGE | Freq: Two times a day (BID) | INTRAMUSCULAR | Status: DC | PRN
  Administered 2021-09-02 – 2021-09-04 (×3): 50 ug via INTRAVENOUS
  Filled 2021-09-01 (×3): qty 1

## 2021-09-01 MED ORDER — POTASSIUM CHLORIDE CRYS ER 20 MEQ PO TBCR
40.0000 meq | EXTENDED_RELEASE_TABLET | Freq: Once | ORAL | Status: AC
  Administered 2021-09-01: 40 meq via ORAL
  Filled 2021-09-01: qty 2

## 2021-09-01 NOTE — Progress Notes (Signed)
Occupational Therapy Treatment Patient Details Name: [name] MRN: [id number] DOB: [birth year] Today's Date: 09/01/2021   History of present illness 66 y.o. female with a history of COPD on 2 L oxygen, GERD, chronic pain, anemia. Patient was admitted for septic shock from leg wound. She required vasopressor support. Blood cultures with no growth. ID consulted for help with antibiotic management. Vasopressor support weaned off. Antibiotics discontinued.   OT comments  Patient progressing well able to utilize rolling walker to transfer to recliner chair this session vs sara stedy. From higher ICU bed patient able to power up to standing with min A +2 and then mod A +1 with tech close guard to take few steps to recliner chair. Patient does fatigue quickly. After seated rest mod A +2 to power up to standing from lower recliner chair then min to mod A in standing while practicing standing marches for ~1 minute. Recommend continued acute OT services to further progress functional strength and endurance needed for transfer training and ADL tasks.    Recommendations for follow up therapy are one component of a multi-disciplinary discharge planning process, led by the attending physician.  Recommendations may be updated based on patient status, additional functional criteria and insurance authorization.    Follow Up Recommendations  Skilled nursing-short term rehab (<3 hours/day)    Assistance Recommended at Discharge Frequent or constant Supervision/Assistance  Patient can return home with the following  A lot of help with walking and/or transfers;A lot of help with bathing/dressing/bathroom;Assistance with cooking/housework;Direct supervision/assist for medications management;Direct supervision/assist for financial management;Assist for transportation;Help with stairs or ramp for entrance   Equipment Recommendations  Other (comment) (defer next venue)       Precautions / Restrictions  Precautions Precautions: Fall Precaution Comments: right posterior thigh wound, right foot(plantar heel), buttock wounds       Mobility Bed Mobility Overal bed mobility: Needs Assistance Bed Mobility: Sidelying to Sit   Sidelying to sit: Mod assist, HOB elevated       General bed mobility comments: Mod A to pull trunk upright       Balance Overall balance assessment: History of Falls, Needs assistance Sitting-balance support: Feet supported Sitting balance-Leahy Scale: Fair     Standing balance support: Reliant on assistive device for balance Standing balance-Leahy Scale: Poor                             ADL either performed or assessed with clinical judgement   ADL                           Toilet Transfer: +2 for physical assistance;+2 for safety/equipment;Minimal assistance;Moderate assistance;Stand-pivot;Cueing for sequencing;Cueing for safety;Rolling walker (2 wheels) Toilet Transfer Details (indicate cue type and reason): To recliner chair. Patient min A +2 to power up to standing from edge of bed and mod A +1 with tech present while side stepping to recliner chair. Fatigues quickly and needing assist to reach back for chair.         Functional mobility during ADLs: Minimal assistance;Moderate assistance;+2 for physical assistance;+2 for safety/equipment;Cueing for safety;Cueing for sequencing;Rolling walker (2 wheels) General ADL Comments: After seated rest break in recliner patient mod A +2 to power up to standing from lower recliner chair. Once standing balance achieved patient mod A +1 to perform standing marches for ~1 min before returning to sitting. Improved ability to support weight through LEs and utilize  rolling walker vs stedy      Cognition Arousal/Alertness: Awake/alert Behavior During Therapy: WFL for tasks assessed/performed Overall Cognitive Status: Within Functional Limits for tasks assessed                                                 General Comments Patient O2 between 89-94% on room air during session. Donned nasal cannula at end of session.    Pertinent Vitals/ Pain       Pain Assessment Pain Assessment: Faces Faces Pain Scale: Hurts even more Pain Location: R thigh Pain Descriptors / Indicators: Grimacing, Tender Pain Intervention(s): Monitored during session         Frequency  Min 2X/week        Progress Toward Goals  OT Goals(current goals can now be found in the care plan section)  Progress towards OT goals: Progressing toward goals  Acute Rehab OT Goals Patient Stated Goal: Get to chair OT Goal Formulation: With patient Time For Goal Achievement: 09/15/21 Potential to Achieve Goals: Good ADL Goals Pt Will Perform Lower Body Dressing: sitting/lateral leans;with adaptive equipment;with mod assist Pt/caregiver will Perform Home Exercise Program: Increased strength;Both right and left upper extremity;Independently Additional ADL Goal #1: Patient will tolerate 15 minutes of OOB activity in order to participate in self care tasks and transfer training.  Plan Discharge plan remains appropriate       AM-PAC OT "6 Clicks" Daily Activity     Outcome Measure   Help from another person eating meals?: A Little Help from another person taking care of personal grooming?: A Little Help from another person toileting, which includes using toliet, bedpan, or urinal?: A Lot Help from another person bathing (including washing, rinsing, drying)?: A Lot Help from another person to put on and taking off regular upper body clothing?: A Little Help from another person to put on and taking off regular lower body clothing?: Total 6 Click Score: 14    End of Session Equipment Utilized During Treatment: Rolling walker (2 wheels);Gait belt  OT Visit Diagnosis: Other abnormalities of gait and mobility (R26.89);History of falling (Z91.81);Muscle weakness (generalized) (M62.81)    Activity Tolerance Patient tolerated treatment well   Patient Left in chair;with call bell/phone within reach;with chair alarm set   Nurse Communication Mobility status;Other (comment) (use of stedy back to bed)        Time: 3536-1443 OT Time Calculation (min): 18 min  Charges: OT General Charges $OT Visit: 1 Visit OT Treatments $Self Care/Home Management : 8-22 mins  Marlyce Huge OT OT pager: [phone]   Carmelia Roller 09/01/2021, 2:15 PM

## 2021-09-01 NOTE — Progress Notes (Signed)
Pt arrived to room 1503 from the ICU. Received report from [place], Charity fundraiser. See assessment. Will continue to monitor.

## 2021-09-01 NOTE — TOC Progression Note (Addendum)
Transition of Care [facility]) - Progression Note    Patient Details  Name: [name] MRN: [id number] Date of Birth: Nov 28, 1955  Transition of Care [facility]) CM/SW Contact  Darleene Cleaver, [place] Phone Number: 09/01/2021, 11:50 AM  Clinical Narrative:     Level 2 [facility] paperwork was uploaded to [facility]'s website.  2:00pm  CSW received [facility] number back.  [phone] A. CSW awaiting bed offers for patient.  Expected Discharge Plan: Home w Home Health Services Barriers to Discharge: Continued Medical Work up  Expected Discharge Plan and Services Expected Discharge Plan: Home w Home Health Services   Discharge Planning Services: CM Consult Post Acute Care Choice: Resumption of Svcs/PTA Provider Living arrangements for the past 2 months: Single Family Home                                       Social Determinants of Health (SDOH) Interventions    Readmission Risk Interventions No flowsheet data found.

## 2021-09-01 NOTE — Progress Notes (Signed)
PROGRESS NOTE    [name]  [id number] DOB: [birth year] DOA: 08/14/2021 PCP: Crist Infante, MD    Brief Narrative:  66 y.o. female with a history of COPD on 2 L oxygen, GERD, chronic pain, anemia. Patient was admitted for septic shock from leg wound. She required vasopressor support. Blood cultures with no growth. ID consulted for help with antibiotic management. Vasopressor support weaned off. Antibiotics discontinued.  Assessment & Plan:   Principal Problem:   Sepsis ([place]) Active Problems:   Septic shock ([place])   DNR (do not resuscitate)   Pressure injury of skin  Septic shock Secondary to RLE cellulitis. Patient required vasopressor support, stress dose steroids and midodrine. Vasopressors weaned off. Resolved -Patient's bp has remained persistently low, requiring intermittent fluid boluses -Per PCCM, goal to keep MAP >55 -Given gross anasarca, question pt's ability to absorb PO meds including steroids. Pt is continued on IV hydrocortisone to allow BP to diureses. Plan to gradually wean off steroids over time as tolerated -BP remains stable this AM   RLE cellulitis Patient empirically started on Vancomycin and Zosyn. ID consulted with current recommendations to discontinue antibiotics. Wound care consulted Cellulitis much improved   Hypotension Persistent. In setting of septic shock but has remained persistent after treatment of infection. PCCM recommending map goal of >55. Patient is currently managed on midodrine -did require intermittent IVF boluses over the past week -Given anasarca, question pt's ability to absorb PO meds -Pt is continued on IV hydrocortisone to allow BP to diurese -BP has improved, anticipate weaning steroids as able   Acute on chronic respiratory failure with hypoxia Currently at baseline oxygen   COPD -Continue Duonebs -Stable   Anxiety -Continue Wellbutrin, Effexor and gabapentin -Continue Xanax   Hypothyroidism -Continue  Synthroid -Most recent TSH from 1/16 of 3.003   Chronic opiate use -Continue oxycodone prn   Hyponatremia Mild. Asymptomatic. Possible related to vol overload Resolved with lasix Repeat bmet in AM   GERD -Continue Protonix   Anemia of chronic disease Stable. -Iron panel   Hyperglycemia Hemoglobin A1C of 5.6; does not meet criteria for prediabetes.  -Discontinued Levemir this visit -does not seem to need glycemic coverage, later discontinued SSI coverage    Failure to thrive Severe malnutrition Dietitian recommendations (1/17): will order Ensure Enlive BID, each supplement provides 350 kcal and 20 grams of protein   will order 1 packet Juven BID, each packet provides 95 calories, 2.5 grams of protein (collagen), and 9.8 grams of carbohydrate (3 grams sugar); also contains 7 grams of L-arginine and L-glutamine, 300 mg vitamin C, 15 mg vitamin E, 1.2 mcg vitamin B-12, 9.5 mg zinc, 200 mg calcium, and 1.5 g  Calcium Beta-hydroxy-Beta-methylbutyrate to support wound healing.   Poor social situation Bedbugs and fleas reported in the house -Recs for SNF at time of d/c   Pressure injury Left/medial buttock, right/posterior buttock, posterior thigh. Present on admission.   Anasarca -Likely secondary to volume resuscitation in setting of hypoalbuminemia -BP improved, tolerating trial of lasix -UE neg for DVT -Continue to diurese with IV lasix  DVT prophylaxis: Lovenox subq Code Status: DNR Family Communication: Pt in room, family not at bedside  Status is: Inpatient  Remains inpatient appropriate because: Severity of illness   Consultants:  PCCM ID  Procedures:  TTE  Antimicrobials: Anti-infectives (From admission, onward)    Start     Dose/Rate Route Frequency Ordered Stop   08/15/21 2200  vancomycin (VANCOREADY) IVPB 1250 mg/250 mL  Status:  Discontinued  1,250 mg 166.7 mL/hr over 90 Minutes Intravenous Every 24 hours 08/15/21 0154 08/24/21 1237    08/15/21 1600  piperacillin-tazobactam (ZOSYN) IVPB 3.375 g  Status:  Discontinued        3.375 g 12.5 mL/hr over 240 Minutes Intravenous Every 8 hours 08/15/21 1549 08/25/21 1506   08/15/21 1400  metroNIDAZOLE (FLAGYL) IVPB 500 mg  Status:  Discontinued        500 mg 100 mL/hr over 60 Minutes Intravenous Every 12 hours 08/15/21 1229 08/15/21 1549   08/15/21 0445  ceFEPIme (MAXIPIME) 2 g in sodium chloride 0.9 % 100 mL IVPB  Status:  Discontinued        2 g 200 mL/hr over 30 Minutes Intravenous Every 8 hours 08/15/21 0442 08/15/21 1549   08/15/21 0015  vancomycin (VANCOCIN) IVPB 1000 mg/200 mL premix        1,000 mg 200 mL/hr over 60 Minutes Intravenous  Once 08/15/21 0006 08/15/21 0150   08/14/21 2300  vancomycin (VANCOREADY) IVPB 750 mg/150 mL  Status:  Discontinued        750 mg 150 mL/hr over 60 Minutes Intravenous  Once 08/14/21 2246 08/15/21 0006       Subjective: Reports feeling better today. Swelling improved  Objective: Vitals:   09/01/21 0800 09/01/21 0829 09/01/21 1118 09/01/21 1200  BP: (!) 107/46  (!) 96/37   Pulse: 83  97   Resp: 14  13   Temp: 98 F (36.7 C)   98.2 F (36.8 C)  TempSrc: Oral   Oral  SpO2: 98% 100% 90%   Weight:      Height:        Intake/Output Summary (Last 24 hours) at 09/01/2021 1322 Last data filed at 09/01/2021 [id number] Gross per 24 hour  Intake 1016 ml  Output 3601 ml  Net -2585 ml    Filed Weights   08/30/21 0500 08/31/21 0500 09/01/21 0454  Weight: 97.4 kg 95.9 kg 93.8 kg    Examination: General exam: Awake, laying in bed, in nad Respiratory system: Normal respiratory effort, no wheezing Cardiovascular system: regular rate, s1, s2 Gastrointestinal system: Soft, nondistended, positive BS Central nervous system: CN2-12 grossly intact, strength intact Extremities: Perfused, no clubbing, BLE edema improving Skin: Normal skin turgor, no notable skin lesions seen Psychiatry: Mood normal // no visual hallucinations   Data  Reviewed: I have personally reviewed following labs and imaging studies  CBC: Recent Labs  Lab 08/28/21 0514 08/29/21 0600 08/30/21 0500 08/31/21 0456 09/01/21 0450  WBC 14.6* 8.3 9.1 7.9 8.3  HGB 7.7* 7.6* 7.7* 7.7* 8.3*  HCT 26.5* 26.3* 26.6* 26.4* 28.5*  MCV 88.0 88.6 89.9 87.4 87.4  PLT 408* 476* 529* 506* 538*    Basic Metabolic Panel: Recent Labs  Lab 08/28/21 0514 08/29/21 0600 08/30/21 0500 08/31/21 0456 09/01/21 0450  NA 136 138 135 133* 135  K 3.4* 4.0 3.6 3.8 3.3*  CL 95* 95* 93* 91* 92*  CO2 35* 37* 34* 35* 36*  GLUCOSE 173* 156* 134* 93 108*  BUN 20 22 25* 27* 24*  CREATININE 0.64 0.59 0.48 0.67 0.65  CALCIUM 8.1* 8.4* 8.3* 8.2* 8.5*    GFR: Estimated Creatinine Clearance: 77.8 mL/min (by C-G formula based on SCr of 0.65 mg/dL). Liver Function Tests: Recent Labs  Lab 08/28/21 0514 08/29/21 0600 08/30/21 0500 08/31/21 0456 09/01/21 0450  AST 19 15 16 19 22   ALT 23 23 20 22 28   ALKPHOS 49 57 67 69 85  BILITOT 0.2* 0.4 0.4  0.6 0.4  PROT 5.4* 5.6* 5.8* 5.2* 5.6*  ALBUMIN 2.8* 2.6* 2.8* 2.5* 2.6*    No results for input(s): LIPASE, AMYLASE in the last 168 hours. No results for input(s): AMMONIA in the last 168 hours. Coagulation Profile: No results for input(s): INR, PROTIME in the last 168 hours. Cardiac Enzymes: No results for input(s): CKTOTAL, CKMB, CKMBINDEX, TROPONINI in the last 168 hours. BNP (last 3 results) No results for input(s): PROBNP in the last 8760 hours. HbA1C: No results for input(s): HGBA1C in the last 72 hours. CBG: Recent Labs  Lab 08/30/21 1549 08/30/21 2123 08/30/21 2132 08/31/21 0749 08/31/21 1239  GLUCAP 108* 91 103* 73   58* 137*    Lipid Profile: No results for input(s): CHOL, HDL, LDLCALC, TRIG, CHOLHDL, LDLDIRECT in the last 72 hours. Thyroid Function Tests: No results for input(s): TSH, T4TOTAL, FREET4, T3FREE, THYROIDAB in the last 72 hours. Anemia Panel: No results for input(s): VITAMINB12, FOLATE,  FERRITIN, TIBC, IRON, RETICCTPCT in the last 72 hours.  Sepsis Labs: No results for input(s): PROCALCITON, LATICACIDVEN in the last 168 hours.  No results found for this or any previous visit (from the past 240 hour(s)).   Radiology Studies: No results found.  Scheduled Meds:  aspirin  81 mg Oral Daily   buPROPion  150 mg Oral QPM   Chlorhexidine Gluconate Cloth  6 each Topical Daily   collagenase   Topical Daily   enoxaparin (LOVENOX) injection  40 mg Subcutaneous Q24H   feeding supplement  237 mL Oral Q24H   gabapentin  300 mg Oral TID   hydrocortisone sod succinate (SOLU-CORTEF) inj  75 mg Intravenous Daily   influenza vac split quadrivalent PF  0.5 mL Intramuscular Tomorrow-1000   ipratropium-albuterol  3 mL Nebulization BID   levothyroxine  150 mcg Oral Q0600   mouth rinse  15 mL Mouth Rinse BID   midodrine  15 mg Oral TID WC   nutrition supplement (JUVEN)  1 packet Oral Daily   pantoprazole  40 mg Oral Daily   polyethylene glycol  17 g Oral BID   senna  1 tablet Oral Daily   sodium chloride flush  10-40 mL Intracatheter Q12H   sodium hypochlorite   Irrigation BID   venlafaxine XR  150 mg Oral QHS   Continuous Infusions:  sodium chloride Stopped (08/26/21 1725)     LOS: 17 days   Marylu Lund, MD [facility] Pager On Amion  If 7PM-7AM, please contact night-coverage 09/01/2021, 1:22 PM

## 2021-09-02 DIAGNOSIS — I9589 Other hypotension: Secondary | ICD-10-CM

## 2021-09-02 LAB — CBC
HCT: 25.3 % — ABNORMAL LOW (ref 36.0–46.0)
Hemoglobin: 7.3 g/dL — ABNORMAL LOW (ref 12.0–15.0)
MCH: 25.5 pg — ABNORMAL LOW (ref 26.0–34.0)
MCHC: 28.9 g/dL — ABNORMAL LOW (ref 30.0–36.0)
MCV: 88.5 fL (ref 80.0–100.0)
Platelets: 495 10*3/uL — ABNORMAL HIGH (ref 150–400)
RBC: 2.86 MIL/uL — ABNORMAL LOW (ref 3.87–5.11)
RDW: 22.4 % — ABNORMAL HIGH (ref 11.5–15.5)
WBC: 7 10*3/uL (ref 4.0–10.5)
nRBC: 0.3 % — ABNORMAL HIGH (ref 0.0–0.2)

## 2021-09-02 LAB — COMPREHENSIVE METABOLIC PANEL
ALT: 26 U/L (ref 0–44)
AST: 20 U/L (ref 15–41)
Albumin: 2.4 g/dL — ABNORMAL LOW (ref 3.5–5.0)
Alkaline Phosphatase: 79 U/L (ref 38–126)
Anion gap: 5 (ref 5–15)
BUN: 24 mg/dL — ABNORMAL HIGH (ref 8–23)
CO2: 34 mmol/L — ABNORMAL HIGH (ref 22–32)
Calcium: 8.4 mg/dL — ABNORMAL LOW (ref 8.9–10.3)
Chloride: 99 mmol/L (ref 98–111)
Creatinine, Ser: 0.42 mg/dL — ABNORMAL LOW (ref 0.44–1.00)
GFR, Estimated: >60 mL/min (ref >60)
Glucose, Bld: 105 mg/dL — ABNORMAL HIGH (ref 70–99)
Potassium: 3.8 mmol/L (ref 3.5–5.1)
Sodium: 138 mmol/L (ref 135–145)
Total Bilirubin: 0.2 mg/dL — ABNORMAL LOW (ref 0.3–1.2)
Total Protein: 5.4 g/dL — ABNORMAL LOW (ref 6.5–8.1)

## 2021-09-02 MED ORDER — HYDROCORTISONE 5 MG PO TABS
5.0000 mg | ORAL_TABLET | Freq: Every day | ORAL | Status: DC
  Administered 2021-09-03 – 2021-09-04 (×2): 5 mg via ORAL
  Filled 2021-09-02 (×2): qty 1

## 2021-09-02 NOTE — Discharge Instructions (Addendum)
[name],  You were in the hospital with a bad infection. You have improved with antibiotics. You will need continued wound care. Please continue taking your medication as prescribed and follow-up with your primary care physician.   Tips For Adding Protein Nutrition Therapy  Patients may be advised to increase the protein in their diet but not necessarily the calories as well. However, note that when adding protein to your diet, you will also be adding extra calories. The following suggestions may help add the extra protein while keeping the calories as low as possible.  Tips Add extra egg to one or more meals  Increase the portion of milk to drink and change to skim milk if able  Include [place] yogurt or cottage cheese for snack or part of a meal  Increase portion size of protein entre and decrease portion of starch/bread  Mix protein powder, nut butter, almond/nut milk, non-fat dry milk, or Greek yogurt to shakes and smoothies  Use these ingredients also in baked goods or other recipes Use double the amount of sandwich filling  Add protein foods to all snacks including cheese, nut butters, milk and yogurt Food Tips for Including Protein  Beans Cook and use dried peas, beans, and tofu in soups or add to casseroles, pastas, and grain dishes that also contain cheese or meat  Mash with cheese and milk  Use tofu to make smoothies  Commercial Protein Supplements Use nutritional supplements or protein powder sold at pharmacies and grocery stores  Use protein powder in milk drinks and desserts, such as pudding  Mix with ice cream, milk, and fruit or other flavorings for a high-protein milkshake  Cottage Cheese or [employer] Mix with or use to stuff fruits and vegetables  Add to casseroles, spaghetti, noodles, or egg dishes such as omelets, scrambled eggs, and souffls  Use gelatin, pudding-type desserts, cheesecake, and pancake or waffle batter  Use to stuff crepes, pasta shells, or  manicotti  Puree and use as a substitute for sour cream  Eggs, Egg whites, and Egg Yolks Add chopped, hard-cooked eggs to salads and dressings, vegetables, casseroles, and creamed meats  Beat eggs into mashed potatoes, vegetable purees, and sauces  Add extra egg whites to quiches, scrambled eggs, custards, puddings, pancake batter, or [place] toast wash/batter  Make a rich custard with egg yolks, double strength milk, and sugar  Add extra hard-cooked yolks to deviled egg filling and sandwich spreads  Hard or Semi-Soft Cheese (Cheddar, Barnabas Lister, Orange Blossom) Melt on sandwiches, bread, muffins, tortillas, hamburgers, hot dogs, other meats or fish, vegetables, eggs, or desserts such as stewed figs or pies  Grate and add to soups, sauces, casseroles, vegetable dishes, potatoes, rice noodles, or meatloaf  Serve as a snack with crackers or bagels  Ice cream, Yogurt, and Frozen Yogurt Add to milk drinks such as milkshakes  Add to cereals, fruits, gelatin desserts, and pies  Blend or whip with soft or cooked fruits  Sandwich ice cream or frozen yogurt between enriched cake slices, cookies, or graham crackers  Use seasoned yogurt as a dip for fruits, vegetables, or chips  Use yogurt in place of sour cream in casseroles  Meat and Fish Add chopped, cooked meat or fish to vegetables, salads, casseroles, soups, sauces, and biscuit dough  Use in omelets, souffls, quiches, and sandwich fillings  Add chicken and [place] to stuffing  Wrap in pie crust or biscuit dough as turnovers  Add to stuffed baked potatoes  Add pureed meat to soups  Milk Use in beverages and in cooking  Use in preparing foods, such as hot cereal, soups, cocoa, or pudding  Add cream sauces to vegetable and other dishes  Use evaporated milk, evaporated skim milk, or sweetened condensed milk instead of milk or water in recipes.  Nonfat Dry Milk Add 1/3 cup of nonfat dry milk powdered milk to each cup of regular milk for double strength milk  Add  to yogurt and milk drinks, such as pasteurized eggnog and milkshakes  Add to scrambled eggs and mashed potatoes  Use in casseroles, meatloaf, hot cereal, breads, muffins, sauces, cream soups, puddings and custards, and other milk-based desserts  Nuts, Seeds, and Wheat Germ Add to casseroles, breads, muffins, pancakes, cookies, and waffles  Sprinkle on fruit, cereal, ice cream, yogurt, vegetables, salads, and toast as a crunchy topping  Use in place of breadcrumbs  Blend with parsley or spinach, herbs, and cream for a noodle, pasta, or vegetable sauce.  Roll banana in chopped nuts  Peanut Butter Spread on sandwiches, toast, muffins, crackers, waffles, pancakes, and fruit slices  Use as a dip for raw vegetables, such as carrots, cauliflower, and celery  Blend with milk drinks, smoothies, and other beverages  Swirl through soft ice cream or yogurt  Spread on a banana then roll in crushed, dry cereal or chopped nuts   Copyright 2020  [employer]. All rights reserved

## 2021-09-02 NOTE — Progress Notes (Signed)
PROGRESS NOTE    [name]  [id number] DOB: 04-24-1956 DOA: 08/14/2021 PCP: Crist Infante, MD   Brief Narrative: [name] is a 66 y.o. female with a history of COPD on 2 L oxygen, GERD, chronic pain, anemia. Patient was admitted for septic shock from leg wound. She required vasopressor support. Blood cultures with no growth. ID consulted for help with antibiotic management. Vasopressor support weaned off. Antibiotics discontinued. Weaning stress dose steroids.  Assessment & Plan:   Septic shock Secondary to RLE cellulitis. Patient required vasopressor support, stress dose steroids and midodrine. Vasopressors weaned off and stress dose steroids being weaned.  RLE cellulitis Patient empirically started on Vancomycin and Zosyn. ID consulted with recommendations to discontinue antibiotics. Resolved.  Hypotension Persistent. In setting of septic shock but has remained persistent after treatment of infection. PCCM recommending map goal of >55. Patient is currently managed on midodrine and is weaning off Florinef -Continue midodrine -Transition to Cortef PO in AM  Acute on chronic respiratory failure with hypoxia Currently at baseline oxygen  COPD Stable -Continue Duonebs  Anxiety -Continue Wellbutrin, Effexor and gabapentin -Continue Xanax  Hypothyroidism -Continue Synthroid  Chronic opiate use -Continue oxycodone prn  Hyponatremia Mild. Asymptomatic. Resolved.Marland Kitchen  GERD -Continue Protonix  Anemia of chronic disease Stable.  Hyperglycemia Hemoglobin A1C of 5.6; does not meet criteria for prediabetes. Currently hypoglycemic -Discontinue Levemir  Failure to thrive Severe malnutrition Dietitian recommendations (1/17): will order Ensure Enlive BID, each supplement provides 350 kcal and 20 grams of protein  will order 1 packet Juven BID, each packet provides 95 calories, 2.5 grams of protein (collagen), and 9.8 grams of carbohydrate (3 grams sugar); also  contains 7 grams of L-arginine and L-glutamine, 300 mg vitamin C, 15 mg vitamin E, 1.2 mcg vitamin B-12, 9.5 mg zinc, 200 mg calcium, and 1.5 g  Calcium Beta-hydroxy-Beta-methylbutyrate to support wound healing.  will follow-up 1/18 for day #2 Calorie Count results.  Poor social situation Bedbugs and fleas reported in the house  Pressure injury Left/medial buttock, right/posterior buttock, posterior thigh. Present on admission. Wound care recommendations (1/28): 1. Cleanse all wounds with saline, pat dry  2. Apply 1/4" thick layer of Santyl to the right foot wound, top with saline moist gauze  3. Apply 1/4% Dakins solution to gauze for posterior thigh wound, top with ABD pads, secure with tape.    3. Top with ABD pads  4. Saline moist gauze to the clean pink area of the ischial wounds and sacrum. Change daily  Anasarca In setting of hypoalbuminemia. Patient received IV lasix with improvement.  DVT prophylaxis: Lovenox Code Status:   Code Status: DNR Family Communication: None at bedside Disposition Plan: Discharge home (patient declining SNF) in 2-3 days pending stable blood pressure once weaned down on steroids   Consultants:  PCCM Infectious disease  Procedures:  TRANSTHORACIC ECHOCARDIOGRAM (08/15/2021) IMPRESSIONS     1. Left ventricular ejection fraction, by estimation, is 50 to 55%. The  left ventricle has low normal function. The left ventricle has no regional  wall motion abnormalities. Left ventricular diastolic parameters are  consistent with Grade I diastolic  dysfunction (impaired relaxation).   2. Right ventricular systolic function is normal. The right ventricular  size is normal. There is mildly elevated pulmonary artery systolic  pressure. The estimated right ventricular systolic pressure is 123456 mmHg.   3. The mitral valve is normal in structure. No evidence of mitral valve  regurgitation.   4. The aortic valve was not well visualized.  Aortic valve  regurgitation  is not visualized. No aortic stenosis is present.   5. The inferior vena cava is dilated in size with >50% respiratory  variability, suggesting right atrial pressure of 8 mmHg.   Antimicrobials: Vancomycin Zosyn  Cefepime Flagyl   Subjective: Some right posterior thigh pain. No other concerns.  Objective: Vitals:   09/01/21 2024 09/02/21 0503 09/02/21 0746 09/02/21 0850  BP: (!) 103/58 (!) 105/50 (!) 105/56   Pulse: 84 94 (!) 102   Resp: 14 16    Temp: 98.7 F (37.1 C) 98.3 F (36.8 C) 98.2 F (36.8 C)   TempSrc: Oral Oral Oral   SpO2: 95% 95% 94% 95%  Weight:      Height:        Intake/Output Summary (Last 24 hours) at 09/02/2021 1845 Last data filed at 09/02/2021 1543 Gross per 24 hour  Intake 1070 ml  Output 950 ml  Net 120 ml    Filed Weights   08/30/21 0500 08/31/21 0500 09/01/21 0454  Weight: 97.4 kg 95.9 kg 93.8 kg    Examination:  General exam: Appears calm and comfortable Respiratory system: Clear to auscultation. Respiratory effort normal. Cardiovascular system: S1 & S2 heard, RRR. No murmurs, rubs, gallops or clicks. Gastrointestinal system: Abdomen is nondistended, soft and nontender. No organomegaly or masses felt. Normal bowel sounds heard. Central nervous system: Alert and oriented. No focal neurological deficits. Musculoskeletal: Maybe trace BLE. No calf tenderness Skin: No cyanosis. Right thigh wound with overlying dressing. Psychiatry: Judgement and insight appear normal. Mood & affect appropriate.     Data Reviewed: I have personally reviewed following labs and imaging studies  CBC Lab Results  Component Value Date   WBC 7.0 09/02/2021   RBC 2.86 (L) 09/02/2021   HGB 7.3 (L) 09/02/2021   HCT 25.3 (L) 09/02/2021   MCV 88.5 09/02/2021   MCH 25.5 (L) 09/02/2021   PLT 495 (H) 09/02/2021   MCHC 28.9 (L) 09/02/2021   RDW 22.4 (H) 09/02/2021   LYMPHSABS 1.8 08/19/2021   MONOABS 0.5 08/19/2021   EOSABS 0.0 08/19/2021    BASOSABS 0.0 08/19/2021     Last metabolic panel Lab Results  Component Value Date   NA 138 09/02/2021   K 3.8 09/02/2021   CL 99 09/02/2021   CO2 34 (H) 09/02/2021   BUN 24 (H) 09/02/2021   CREATININE 0.42 (L) 09/02/2021   GLUCOSE 105 (H) 09/02/2021   GFRNONAA >60 09/02/2021   GFRAA >60 04/06/2018   CALCIUM 8.4 (L) 09/02/2021   PHOS 3.1 08/19/2021   PROT 5.4 (L) 09/02/2021   ALBUMIN 2.4 (L) 09/02/2021   BILITOT 0.2 (L) 09/02/2021   ALKPHOS 79 09/02/2021   AST 20 09/02/2021   ALT 26 09/02/2021   ANIONGAP 5 09/02/2021    CBG (last 3)  Recent Labs    08/30/21 2132 08/31/21 0749 08/31/21 1239  GLUCAP 103* 73   58* 137*      GFR: Estimated Creatinine Clearance: 77.8 mL/min (A) (by C-G formula based on SCr of 0.42 mg/dL (L)).  Coagulation Profile: No results for input(s): INR, PROTIME in the last 168 hours.  No results found for this or any previous visit (from the past 240 hour(s)).       Radiology Studies: No results found.      Scheduled Meds:  aspirin  81 mg Oral Daily   buPROPion  150 mg Oral QPM   Chlorhexidine Gluconate Cloth  6 each Topical Daily   collagenase  Topical Daily   enoxaparin (LOVENOX) injection  40 mg Subcutaneous Q24H   feeding supplement  237 mL Oral Q24H   gabapentin  300 mg Oral TID   [START ON 09/03/2021] hydrocortisone  5 mg Oral Daily   influenza vac split quadrivalent PF  0.5 mL Intramuscular Tomorrow-1000   ipratropium-albuterol  3 mL Nebulization BID   levothyroxine  150 mcg Oral Q0600   mouth rinse  15 mL Mouth Rinse BID   midodrine  15 mg Oral TID WC   nutrition supplement (JUVEN)  1 packet Oral Daily   pantoprazole  40 mg Oral Daily   polyethylene glycol  17 g Oral BID   senna  1 tablet Oral Daily   sodium chloride flush  10-40 mL Intracatheter Q12H   sodium hypochlorite   Irrigation BID   venlafaxine XR  150 mg Oral QHS   Continuous Infusions:  sodium chloride Stopped (08/26/21 1725)     LOS: 18 days      Cordelia Poche, MD [facility] 09/02/2021, 6:45 PM  If 7PM-7AM, please contact night-coverage [email or web address]

## 2021-09-02 NOTE — Plan of Care (Signed)

## 2021-09-02 NOTE — Progress Notes (Addendum)
Nutrition Follow-up  INTERVENTION:   -Ensure Plus High Protein po daily, each supplement provides 350 kcal and 20 grams of protein.   -Juven daily, each serving provides 95kcal and 2.5g of protein (amino acids glutamine and arginine)   -Placed "High Protein" handout in discharge instructions  NUTRITION DIAGNOSIS:   Increased nutrient needs related to acute illness, wound healing as evidenced by estimated needs.  Ongoing.  GOAL:   Patient will meet greater than or equal to 90% of their needs  Progressing.  MONITOR:   PO intake, Supplement acceptance, Labs, Weight trends, Skin  ASSESSMENT:   66 year old female with medical history of COPD on 2L O2. GERD, chronic lower extremity venous insufficiency and edema., gout, hypercholesterolemia. Patient presented to the ED due to wounds, weeping R leg wound.  Patient in room, states she is eating well. Ate eggs, bacon and sausage for breakfast. For lunch had corn, collards, pinto beans and cake. Pt states she has had no issues with chewing any foods (despite being edentulous). Reports sores in her mouth, states the Magic Mouthwash helps with this. Patient really likes the Ensure and was drinking her Juven with no issues. On 1/30, her supplement orders were reduced d/t fluid restriction. Pt reports the swelling in her BLE has improved.   Admission weight: 190 lbs. Current weight: 206 lbs  I/Os: -22.6L since 1/18  Medications: Miralax, Senokot, Magic Mouthwash w/ lidocaine  Labs reviewed:  CBGs: 58-137  Diet Order:   Diet Order             Diet Carb Modified Fluid consistency: Thin; Room service appropriate? Yes; Fluid restriction: 1500 mL Fluid  Diet effective now                   EDUCATION NEEDS:   Education needs have been addressed  Skin:  Skin Assessment: Skin Integrity Issues: Skin Integrity Issues:: Stage III, Stage II, Diabetic Ulcer, Other (Comment) Stage II: bilateral buttocks Stage III: R thigh Diabetic  Ulcer: R heel Other: venous stasis ulcers to bilateral heels  Last BM:  1/24 (type 6 x2, medium amount and large amount)  Height:   Ht Readings from Last 1 Encounters:  08/15/21 5\' 4"  (1.626 m)    Weight:   Wt Readings from Last 1 Encounters:  09/01/21 93.8 kg    BMI:  Body mass index is 35.5 kg/m.  Estimated Nutritional Needs:   Kcal:  2150-2350 kcal  Protein:  110-125 grams  Fluid:  >/= 2.3 L/day  Clayton Bibles, MS, RD, LDN Inpatient Clinical Dietitian Contact information available via Amion

## 2021-09-03 MED ORDER — GUAIFENESIN-DM 100-10 MG/5ML PO SYRP: 5.0000 mL | ORAL_SOLUTION | Freq: Four times a day (QID) | ORAL | Status: DC | PRN

## 2021-09-03 NOTE — TOC Transition Note (Signed)
Transition of Care [facility]) - CM/SW Discharge Note   Patient Details  Name: [name] MRN: [id number] Date of Birth: Jul 27, 1956  [facility]) CM/SW Contact:  Ida Rogue, LCSW Phone Number: 09/03/2021, 2:33 PM   Clinical Narrative:   MD is anticipating Ms Meenan will be stable for d/c tomorrow.  She confirmed with me that her plan is still to go stay with her Goddaughter in [place]. All orders seen and appreciated.  [facility] will deliver a 3 in 1 to the home-address is [address] in [place].  They have also submitted an order for adult diapers and bed pads for delivery.  Awaiting confirmation on that one.  Cindie with [facility] has confirmed that they will provide [facility] RN and PT services. She asks that we send enough dressing change supplies for 3 days until [facility] RN can get out there.  Ms Macdonell will need PTAR ride home. TOC will continue to follow during the course of hospitalization.     Final next level of care: Home w Home Health Services Barriers to Discharge: Barriers Resolved   Patient Goals and CMS Choice Patient states their goals for this hospitalization and ongoing recovery are:: Home CMS Medicare.gov Compare Post Acute Care list provided to:: Patient Choice offered to / list presented to : Patient  Discharge Placement                       Discharge Plan and Services   Discharge Planning Services: CM Consult Post Acute Care Choice: Resumption of Svcs/PTA Provider                               Social Determinants of Health (SDOH) Interventions     Readmission Risk Interventions No flowsheet data found.

## 2021-09-03 NOTE — Progress Notes (Signed)
PROGRESS NOTE    [name]  [id number] DOB: 1956/01/31 DOA: 08/14/2021 PCP: Crist Infante, MD   Brief Narrative: [name] is a 66 y.o. female with a history of COPD on 2 L oxygen, GERD, chronic pain, anemia. Patient was admitted for septic shock from leg wound. She required vasopressor support. Blood cultures with no growth. ID consulted for help with antibiotic management. Vasopressor support weaned off. Antibiotics discontinued. Weaning stress dose steroids.  Assessment & Plan:   Septic shock Secondary to RLE cellulitis. Patient required vasopressor support, stress dose steroids and midodrine. Vasopressors weaned off and stress dose steroids being weaned.  RLE cellulitis Patient empirically started on Vancomycin and Zosyn. ID consulted with recommendations to discontinue antibiotics. Resolved.  Hypotension Persistent. In setting of septic shock but has remained persistent after treatment of infection. PCCM recommending map goal of >55. Patient is currently managed on midodrine and is weaning off Florinef -Continue midodrine -Cortef PO  Non-sustained V-tach Asymptomatic. Occurred on 2/1. Seven beat run. -Check potassium/magnesium -EKG to check QTc  Acute on chronic respiratory failure with hypoxia Currently at baseline oxygen  COPD Stable -Continue Duonebs  Anxiety -Continue Wellbutrin, Effexor and gabapentin -Continue Xanax  Hypothyroidism -Continue Synthroid  Chronic opiate use -Continue oxycodone prn  Hyponatremia Mild. Asymptomatic. Resolved.[name]  GERD -Continue Protonix  Anemia of chronic disease Stable.  Hyperglycemia Patient with hypoglycemia while on Levemir. Hemoglobin A1C of 5.6; does not meet criteria for prediabetes.  Failure to thrive Severe malnutrition Dietitian recommendations (1/17): will order Ensure Enlive BID, each supplement provides 350 kcal and 20 grams of protein  will order 1 packet Juven BID, each packet provides 95  calories, 2.5 grams of protein (collagen), and 9.8 grams of carbohydrate (3 grams sugar); also contains 7 grams of L-arginine and L-glutamine, 300 mg vitamin C, 15 mg vitamin E, 1.2 mcg vitamin B-12, 9.5 mg zinc, 200 mg calcium, and 1.5 g  Calcium Beta-hydroxy-Beta-methylbutyrate to support wound healing.  will follow-up 1/18 for day #2 Calorie Count results.  Poor social situation Bedbugs and fleas reported in the house  Pressure injury Left/medial buttock, right/posterior buttock, posterior thigh. Present on admission. Wound care recommendations (1/28): 1. Cleanse all wounds with saline, pat dry  2. Apply 1/4" thick layer of Santyl to the right foot wound, top with saline moist gauze  3. Apply 1/4% Dakins solution to gauze for posterior thigh wound, top with ABD pads, secure with tape.    3. Top with ABD pads  4. Saline moist gauze to the clean pink area of the ischial wounds and sacrum. Change daily  Anasarca In setting of hypoalbuminemia. Patient received IV lasix with improvement.   DVT prophylaxis: Lovenox Code Status:   Code Status: DNR Family Communication: None at bedside Disposition Plan: Discharge home (patient declining SNF) in 1-2 days pending stable blood pressure once weaned down on steroids   Consultants:  PCCM Infectious disease  Procedures:  TRANSTHORACIC ECHOCARDIOGRAM (08/15/2021) IMPRESSIONS     1. Left ventricular ejection fraction, by estimation, is 50 to 55%. The  left ventricle has low normal function. The left ventricle has no regional  wall motion abnormalities. Left ventricular diastolic parameters are  consistent with Grade I diastolic  dysfunction (impaired relaxation).   2. Right ventricular systolic function is normal. The right ventricular  size is normal. There is mildly elevated pulmonary artery systolic  pressure. The estimated right ventricular systolic pressure is 123456 mmHg.   3. The mitral valve is normal in structure. No evidence  of  mitral valve  regurgitation.   4. The aortic valve was not well visualized. Aortic valve regurgitation  is not visualized. No aortic stenosis is present.   5. The inferior vena cava is dilated in size with >50% respiratory  variability, suggesting right atrial pressure of 8 mmHg.   Antimicrobials: Vancomycin Zosyn  Cefepime Flagyl   Subjective: Some mouth pain. Otherwise no concerns today.  Objective: Vitals:   09/02/21 2018 09/03/21 0351 09/03/21 0500 09/03/21 0810  BP:  (!) 105/55    Pulse:  86    Resp:  20    Temp:  97.8 F (36.6 C)    TempSrc:  Oral    SpO2: 97% 97%  95%  Weight:   123.8 kg   Height:        Intake/Output Summary (Last 24 hours) at 09/03/2021 1013 Last data filed at 09/03/2021 0352 Gross per 24 hour  Intake 510 ml  Output 850 ml  Net -340 ml    Filed Weights   08/31/21 0500 09/01/21 0454 09/03/21 0500  Weight: 95.9 kg 93.8 kg 123.8 kg    Examination:  General exam: Appears calm and comfortable Respiratory system: Clear to auscultation. Respiratory effort normal. Cardiovascular system: S1 & S2 heard, RRR. No murmurs, rubs, gallops or clicks. Gastrointestinal system: Abdomen is nondistended, soft and nontender. No organomegaly or masses felt. Normal bowel sounds heard. Central nervous system: Alert and oriented. No focal neurological deficits. Musculoskeletal: No edema. No calf tenderness Skin: No cyanosis. Psychiatry: Judgement and insight appear normal. Mood & affect appropriate.     Data Reviewed: I have personally reviewed following labs and imaging studies  CBC Lab Results  Component Value Date   WBC 7.0 09/02/2021   RBC 2.86 (L) 09/02/2021   HGB 7.3 (L) 09/02/2021   HCT 25.3 (L) 09/02/2021   MCV 88.5 09/02/2021   MCH 25.5 (L) 09/02/2021   PLT 495 (H) 09/02/2021   MCHC 28.9 (L) 09/02/2021   RDW 22.4 (H) 09/02/2021   LYMPHSABS 1.8 08/19/2021   MONOABS 0.5 08/19/2021   EOSABS 0.0 08/19/2021   BASOSABS 0.0 08/19/2021      Last metabolic panel Lab Results  Component Value Date   NA 138 09/02/2021   K 3.8 09/02/2021   CL 99 09/02/2021   CO2 34 (H) 09/02/2021   BUN 24 (H) 09/02/2021   CREATININE 0.42 (L) 09/02/2021   GLUCOSE 105 (H) 09/02/2021   GFRNONAA >60 09/02/2021   GFRAA >60 04/06/2018   CALCIUM 8.4 (L) 09/02/2021   PHOS 3.1 08/19/2021   PROT 5.4 (L) 09/02/2021   ALBUMIN 2.4 (L) 09/02/2021   BILITOT 0.2 (L) 09/02/2021   ALKPHOS 79 09/02/2021   AST 20 09/02/2021   ALT 26 09/02/2021   ANIONGAP 5 09/02/2021    CBG (last 3)  Recent Labs    08/31/21 1239  GLUCAP 137*      GFR: Estimated Creatinine Clearance: 91.1 mL/min (A) (by C-G formula based on SCr of 0.42 mg/dL (L)).  Coagulation Profile: No results for input(s): INR, PROTIME in the last 168 hours.  No results found for this or any previous visit (from the past 240 hour(s)).       Radiology Studies: No results found.      Scheduled Meds:  aspirin  81 mg Oral Daily   buPROPion  150 mg Oral QPM   Chlorhexidine Gluconate Cloth  6 each Topical Daily   collagenase   Topical Daily   enoxaparin (LOVENOX) injection  40 mg  Subcutaneous Q24H   feeding supplement  237 mL Oral Q24H   gabapentin  300 mg Oral TID   hydrocortisone  5 mg Oral Daily   influenza vac split quadrivalent PF  0.5 mL Intramuscular Tomorrow-1000   ipratropium-albuterol  3 mL Nebulization BID   levothyroxine  150 mcg Oral Q0600   mouth rinse  15 mL Mouth Rinse BID   midodrine  15 mg Oral TID WC   nutrition supplement (JUVEN)  1 packet Oral Daily   pantoprazole  40 mg Oral Daily   polyethylene glycol  17 g Oral BID   senna  1 tablet Oral Daily   sodium chloride flush  10-40 mL Intracatheter Q12H   sodium hypochlorite   Irrigation BID   venlafaxine XR  150 mg Oral QHS   Continuous Infusions:  sodium chloride Stopped (08/26/21 1725)     LOS: 19 days     Cordelia Poche, MD [facility] 09/03/2021, 10:13 AM  If 7PM-7AM, please contact  night-coverage [email or web address]

## 2021-09-03 NOTE — Plan of Care (Signed)

## 2021-09-04 DIAGNOSIS — L0390 Cellulitis, unspecified: Secondary | ICD-10-CM

## 2021-09-04 LAB — CBC
HCT: 26.3 % — ABNORMAL LOW (ref 36.0–46.0)
Hemoglobin: 7.7 g/dL — ABNORMAL LOW (ref 12.0–15.0)
MCH: 25.2 pg — ABNORMAL LOW (ref 26.0–34.0)
MCHC: 29.3 g/dL — ABNORMAL LOW (ref 30.0–36.0)
MCV: 86.2 fL (ref 80.0–100.0)
Platelets: 460 10*3/uL — ABNORMAL HIGH (ref 150–400)
RBC: 3.05 MIL/uL — ABNORMAL LOW (ref 3.87–5.11)
RDW: 22 % — ABNORMAL HIGH (ref 11.5–15.5)
WBC: 9.3 10*3/uL (ref 4.0–10.5)
nRBC: 0.2 % (ref 0.0–0.2)

## 2021-09-04 LAB — MAGNESIUM: Magnesium: 2.1 mg/dL (ref 1.7–2.4)

## 2021-09-04 LAB — POTASSIUM: Potassium: 4 mmol/L (ref 3.5–5.1)

## 2021-09-04 MED ORDER — MIDODRINE HCL 5 MG PO TABS: 15.0000 mg | ORAL_TABLET | Freq: Three times a day (TID) | ORAL | 2 refills | Status: AC

## 2021-09-04 MED ORDER — LIDOCAINE VISCOUS HCL 2 % MT SOLN: 15.0000 mL | OROMUCOSAL | 2 refills | Status: DC | PRN

## 2021-09-04 MED ORDER — POLYETHYLENE GLYCOL 3350 17 G PO PACK: 17.0000 g | PACK | Freq: Two times a day (BID) | ORAL | 0 refills | Status: DC

## 2021-09-04 MED ORDER — ENSURE ENLIVE PO LIQD: 237.0000 mL | ORAL | 30 refills | Status: DC

## 2021-09-04 MED ORDER — JUVEN PO PACK: 1.0000 | PACK | Freq: Every day | ORAL | 2 refills | Status: AC

## 2021-09-04 MED ORDER — HYDROCORTISONE 5 MG PO TABS: 5.0000 mg | ORAL_TABLET | Freq: Every day | ORAL | 0 refills | Status: DC

## 2021-09-04 NOTE — Plan of Care (Signed)

## 2021-09-04 NOTE — Progress Notes (Signed)
Physical Therapy Treatment Patient Details Name: [name] MRN: [id number] DOB: 1956/02/14 Today's Date: 09/04/2021   History of Present Illness 66 y.o. female with a history of COPD on 2 L oxygen, GERD, chronic pain, anemia. Patient was admitted for septic shock from leg wound. She required vasopressor support. Blood cultures with no growth. ID consulted for help with antibiotic management. Vasopressor support weaned off. Antibiotics discontinued.    PT Comments    Pt very excited and anxious about d/c to her goddaughter's house. Pt stood x2 from EOB with MIN-MODA+2 for assist with pericare and scrub change. Attempted lateral stepping along EOB MOD-MAX A required for dynamic activity in standing. Pt reporting she will be going home with family vs. SNF. Continue to recommend SNF for short term rehab prior to d/c home. If decline SNF, recommend HHPT with +2 assistance for all mobility.  Pt will benefit from continued skilled PT to increase their independence and maximize safety with mobility.     Recommendations for follow up therapy are one component of a multi-disciplinary discharge planning process, led by the attending physician.  Recommendations may be updated based on patient status, additional functional criteria and insurance authorization.  Follow Up Recommendations  Skilled nursing-short term rehab (<3 hours/day) (If decline SNF, recommend HHPT, [facility])     Assistance Recommended at Discharge Frequent or constant Supervision/Assistance  Patient can return home with the following Two people to help with walking and/or transfers;A lot of help with bathing/dressing/bathroom;Assistance with cooking/housework;Assist for transportation;Direct supervision/assist for medications management;Help with stairs or ramp for entrance;Direct supervision/assist for financial management   Equipment Recommendations   (Pt owns RW)    Recommendations for Other Services       Precautions /  Restrictions Precautions Precautions: Fall Precaution Comments: right posterior thigh wound, right foot(plantar heel), buttock wounds Restrictions Weight Bearing Restrictions: No     Mobility  Bed Mobility Overal bed mobility: Needs Assistance Bed Mobility: Supine to Sit, Sit to Supine     Supine to sit: HOB elevated, Min guard Sit to supine: Mod assist   General bed mobility comments: patient able to come to EOB wit cues for use of UEs to assist. Mod A for LEs back onto bed and controlled descent of trunk.    Transfers Overall transfer level: Needs assistance Equipment used: Rolling walker (2 wheels) Transfers: Sit to/from Stand Sit to Stand: Mod assist, +2 physical assistance, +2 safety/equipment, Min assist           General transfer comment: MOD A+2 on inital stand for power up, progressed to MIN A+2 on 2nd stand attempt from EOB. MOD A for static standing for assist with pericare. Pt with difficulty taking side steps along EOB x3 requiring increased MAX A demonstrating increased B knee flexion and requiring facilitation for lateral weight shifting to offload for progression of LEs laterally. Pt unable to progress further and assisted back to sitting EOB for safety.    Ambulation/Gait                   Stairs             Wheelchair Mobility    Modified Rankin (Stroke Patients Only)       Balance Overall balance assessment: History of Falls, Needs assistance Sitting-balance support: Feet supported Sitting balance-Leahy Scale: Fair     Standing balance support: Reliant on assistive device for balance, During functional activity Standing balance-Leahy Scale: Poor Standing balance comment: reliant on UEs and mod to max  A in standing                            Cognition Arousal/Alertness: Awake/alert Behavior During Therapy: WFL for tasks assessed/performed Overall Cognitive Status: Within Functional Limits for tasks assessed                                  General Comments: Pt excited and anxious to get home        Exercises      General Comments        Pertinent Vitals/Pain Pain Assessment Pain Assessment: Faces Faces Pain Scale: Hurts even more Pain Location: R thigh Pain Descriptors / Indicators: Grimacing, Tender Pain Intervention(s): Monitored during session, Patient requesting pain meds-RN notified    Home Living                          Prior Function            PT Goals (current goals can now be found in the care plan section) Acute Rehab PT Goals Patient Stated Goal: i hope i can walk PT Goal Formulation: With patient Time For Goal Achievement: 09/18/21 Potential to Achieve Goals: Fair Progress towards PT goals: Progressing toward goals    Frequency    Min 2X/week      PT Plan Current plan remains appropriate    Co-evaluation   Reason for Co-Treatment: To address functional/ADL transfers;For patient/therapist safety PT goals addressed during session: Mobility/safety with mobility OT goals addressed during session: ADL's and self-care      AM-PAC PT "6 Clicks" Mobility   Outcome Measure  Help needed turning from your back to your side while in a flat bed without using bedrails?: A Lot Help needed moving from lying on your back to sitting on the side of a flat bed without using bedrails?: A Lot Help needed moving to and from a bed to a chair (including a wheelchair)?: Total Help needed standing up from a chair using your arms (e.g., wheelchair or bedside chair)?: Total Help needed to walk in hospital room?: Total Help needed climbing 3-5 steps with a railing? : Total 6 Click Score: 8    End of Session Equipment Utilized During Treatment: Gait belt Activity Tolerance: Patient tolerated treatment well Patient left: in bed;with call bell/phone within reach Nurse Communication: Mobility status PT Visit Diagnosis: Unsteadiness on feet  (R26.81);Muscle weakness (generalized) (M62.81);Pain;History of falling (Z91.81) Pain - Right/Left: Right Pain - part of body: Leg     Time: 8250-5397 PT Time Calculation (min) (ACUTE ONLY): 23 min  Charges:  $Therapeutic Activity: 8-22 mins                     Lyman Speller PT, DPT  Acute Rehabilitation Services  Office [phone] 09/04/2021, 3:30 PM

## 2021-09-04 NOTE — Progress Notes (Signed)
Patient in the yellow MEWS. Yellow MEWS protocol initiated. Patient is alert and oriented x4. Pt is not in acute distress. Will continue with MEWs protocol.

## 2021-09-04 NOTE — Progress Notes (Signed)
Called [facility] to arrange transportation.   Went over discharge instructions w/ pt. Pt verbalized understanding.

## 2021-09-04 NOTE — Progress Notes (Signed)
Occupational Therapy Treatment Patient Details Name: [name] MRN: [id number] DOB: 10-13-1955 Today's Date: 09/04/2021   History of present illness 66 y.o. female with a history of COPD on 2 L oxygen, GERD, chronic pain, anemia. Patient was admitted for septic shock from leg wound. She required vasopressor support. Blood cultures with no growth. ID consulted for help with antibiotic management. Vasopressor support weaned off. Antibiotics discontinued.   OT comments  Patient anxious about getting home to goddaughter's house. Assisted patient with dressing in disposable scrubs as she does not have clothing here in hospital. Patient set up assistance for UB and total A for LB due to poor standing balance, heavily reliant on upper extremity support of walker and mod to max A standing from PT while OT perform LB dressing. Patient also with small amount of stool in standing needing total A for perianal care. Patient adamant about going home vs rehab, would recommend maximizing HHOT services to assist in regaining functional independence at home.     Recommendations for follow up therapy are one component of a multi-disciplinary discharge planning process, led by the attending physician.  Recommendations may be updated based on patient status, additional functional criteria and insurance authorization.    Follow Up Recommendations  Skilled nursing-short term rehab (<3 hours/day) (if patient declines rehab then [facility])    Assistance Recommended at Discharge Frequent or constant Supervision/Assistance  Patient can return home with the following  Two people to help with walking and/or transfers;A lot of help with bathing/dressing/bathroom;Assistance with cooking/housework;Direct supervision/assist for medications management;Direct supervision/assist for financial management;Assist for transportation;Help with stairs or ramp for entrance   Equipment Recommendations  BSC/3in1       Precautions /  Restrictions Precautions Precautions: Fall Precaution Comments: right posterior thigh wound, right foot(plantar heel), buttock wounds Restrictions Weight Bearing Restrictions: No       Mobility Bed Mobility Overal bed mobility: Needs Assistance Bed Mobility: Supine to Sit, Sit to Supine     Supine to sit: Min guard, HOB elevated Sit to supine: Mod assist   General bed mobility comments: patient able to sit herself upright with HOB elevated. Mod A for LEs back onto bed       Balance Overall balance assessment: History of Falls, Needs assistance Sitting-balance support: Feet supported Sitting balance-Leahy Scale: Fair     Standing balance support: Reliant on assistive device for balance, During functional activity Standing balance-Leahy Scale: Poor Standing balance comment: reliant on UEs and mod to max A in standing                           ADL either performed or assessed with clinical judgement   ADL Overall ADL's : Needs assistance/impaired                 Upper Body Dressing : Set up;Sitting   Lower Body Dressing: Total assistance;Sitting/lateral leans;Sit to/from stand Lower Body Dressing Details (indicate cue type and reason): Assist to thread LEs and pull up pants over bilateral hips. poor standing balance Toilet Transfer: Moderate assistance;Maximal assistance;+2 for physical assistance;+2 for safety/equipment Toilet Transfer Details (indicate cue type and reason): to take side steps to head of bed, difficulty supporting her weight with knees starting to buckle Toileting- Clothing Manipulation and Hygiene: Total assistance;Sit to/from stand Toileting - Clothing Manipulation Details (indicate cue type and reason): Note small amout of stool when standing needing total A for peri care as patient with poor standing balance and  heavily reliant on UEs     Functional mobility during ADLs: Moderate assistance;Maximal assistance;+2 for physical  assistance;+2 for safety/equipment;Rolling walker (2 wheels);Cueing for safety;Cueing for sequencing        Cognition Arousal/Alertness: Awake/alert Behavior During Therapy: WFL for tasks assessed/performed Overall Cognitive Status: Within Functional Limits for tasks assessed                                 General Comments: anxious to get home                   Pertinent Vitals/ Pain       Pain Assessment Pain Assessment: Faces Faces Pain Scale: Hurts even more Pain Location: R thigh Pain Descriptors / Indicators: Grimacing, Tender Pain Intervention(s): Monitored during session, Patient requesting pain meds-RN notified         Frequency  Min 2X/week        Progress Toward Goals  OT Goals(current goals can now be found in the care plan section)  Progress towards OT goals: Progressing toward goals  Acute Rehab OT Goals Patient Stated Goal: home today OT Goal Formulation: With patient Time For Goal Achievement: 09/15/21 Potential to Achieve Goals: Good ADL Goals Pt Will Perform Lower Body Dressing: sitting/lateral leans;with adaptive equipment;with mod assist Pt/caregiver will Perform Home Exercise Program: Increased strength;Both right and left upper extremity;Independently Additional ADL Goal #1: Patient will tolerate 15 minutes of OOB activity in order to participate in self care tasks and transfer training.  Plan Discharge plan remains appropriate    Co-evaluation    PT/OT/SLP Co-Evaluation/Treatment: Yes Reason for Co-Treatment: To address functional/ADL transfers;For patient/therapist safety PT goals addressed during session: Mobility/safety with mobility OT goals addressed during session: ADL's and self-care      AM-PAC OT "6 Clicks" Daily Activity     Outcome Measure   Help from another person eating meals?: A Little Help from another person taking care of personal grooming?: A Little Help from another person toileting, which  includes using toliet, bedpan, or urinal?: A Lot Help from another person bathing (including washing, rinsing, drying)?: A Lot Help from another person to put on and taking off regular upper body clothing?: A Little Help from another person to put on and taking off regular lower body clothing?: Total 6 Click Score: 14    End of Session Equipment Utilized During Treatment: Rolling walker (2 wheels)  OT Visit Diagnosis: Other abnormalities of gait and mobility (R26.89);History of falling (Z91.81);Muscle weakness (generalized) (M62.81)   Activity Tolerance Patient tolerated treatment well   Patient Left in bed;with call bell/phone within reach   Nurse Communication Mobility status;Patient requests pain meds        Time: 1349-1412 OT Time Calculation (min): 23 min  Charges: OT General Charges $OT Visit: 1 Visit OT Treatments $Self Care/Home Management : 8-22 mins  Delbert Phenix OT OT pager: [phone]   Rosemary Holms 09/04/2021, 2:19 PM

## 2021-09-04 NOTE — Discharge Summary (Signed)
Physician Discharge Summary   Patient: [name] MRN: [id number] DOB: 09/16/1955  Admit date:     08/14/2021  Discharge date: 09/04/21  Discharge Physician: Jacquelin Hawking   PCP: Rodrigo Ran, MD   Recommendations at discharge:   Continued outpatient wound care with home health services PCP follow-up   Hospital Course: [name] is a 66 y.o. female with a history of COPD on 2 L oxygen, GERD, chronic pain, anemia. Patient was admitted for septic shock from leg wound. She required vasopressor support. Blood cultures with no growth. ID consulted for help with antibiotic management. Vasopressor support weaned off. Antibiotics discontinued. Weaning stress dose steroids. Patient discharged with recommendations for continued wound care.  Assessment and Plan:  Septic shock Secondary to RLE cellulitis. Patient required vasopressor support, stress dose steroids and midodrine. Vasopressors weaned off and stress dose steroids being weaned.   RLE cellulitis Patient empirically started on Vancomycin and Zosyn. ID consulted with recommendations to discontinue antibiotics. Resolved.   Hypotension Persistent. In setting of septic shock but has remained persistent after treatment of infection. PCCM recommending map goal of >55. Patient is currently managed on midodrine and is weaning off Florinef -Continue midodrine -Cortef PO   Non-sustained V-tach Asymptomatic. Occurred on 2/1. Seven beat run. -Check potassium/magnesium -EKG to check QTc   Acute on chronic respiratory failure with hypoxia Currently at baseline oxygen   COPD Stable -Continue Duonebs   Anxiety -Continue Wellbutrin, Effexor and gabapentin -Continue Xanax   Hypothyroidism -Continue Synthroid   Chronic opiate use -Continue oxycodone prn   Hyponatremia Mild. Asymptomatic. Resolved.Marland Kitchen   GERD -Continue Protonix   Anemia of chronic disease Stable.   Hyperglycemia Patient with hypoglycemia while on Levemir.  Hemoglobin A1C of 5.6; does not meet criteria for prediabetes.   Failure to thrive Severe malnutrition Dietitian recommendations (1/17): will order Ensure Enlive BID, each supplement provides 350 kcal and 20 grams of protein   will order 1 packet Juven BID, each packet provides 95 calories, 2.5 grams of protein (collagen), and 9.8 grams of carbohydrate (3 grams sugar); also contains 7 grams of L-arginine and L-glutamine, 300 mg vitamin C, 15 mg vitamin E, 1.2 mcg vitamin B-12, 9.5 mg zinc, 200 mg calcium, and 1.5 g  Calcium Beta-hydroxy-Beta-methylbutyrate to support wound healing.   will follow-up 1/18 for day #2 Calorie Count results.   Poor social situation Bedbugs and fleas reported in the house   Pressure injury Left/medial buttock, right/posterior buttock, posterior thigh. Present on admission. Wound care recommendations (1/28): 1. Cleanse all wounds with saline, pat dry  2. Apply 1/4" thick layer of Santyl to the right foot wound, top with saline moist gauze  3. Apply 1/4% Dakins solution to gauze for posterior thigh wound, top with ABD pads, secure with tape.    3. Top with ABD pads  4. Saline moist gauze to the clean pink area of the ischial wounds and sacrum. Change daily   Anasarca In setting of hypoalbuminemia. Patient received IV lasix with improvement.         Consultants: PCCM, Infectious disease Procedures performed: Transthoracic Echocardiogram,   Disposition: Home health Diet recommendation:  Regular diet  DISCHARGE MEDICATION: Allergies as of 09/04/2021       Reactions   Gemfibrozil    Other reaction(s): Sick to stomach   Nitroglycerin    Other reaction(s): Cough, Irritation   Other Other (See Comments)   Grape fruit juice - unknown   Pravastatin    Other reaction(s): Dizzy  Sulfamethoxazole-trimethoprim    Other reaction(s): N/V   Tyloxapol    Other reaction(s): Does not work        Medication List     TAKE these medications     ALPRAZolam 1 MG tablet Commonly known as: XANAX Take 1 mg by mouth 3 (three) times daily.   atorvastatin 10 MG tablet Commonly known as: LIPITOR Take 10 mg by mouth daily.   buPROPion 150 MG 24 hr tablet Commonly known as: WELLBUTRIN XL Take 150 mg by mouth every evening.   esomeprazole 40 MG capsule Commonly known as: NEXIUM Take 40 mg by mouth daily.   feeding supplement Liqd Take 237 mLs by mouth daily.   nutrition supplement (JUVEN) Pack Take 1 packet by mouth daily.   gabapentin 300 MG capsule Commonly known as: NEURONTIN Take 300 mg by mouth 3 (three) times daily.   hydrocortisone 5 MG tablet Commonly known as: CORTEF Take 1 tablet (5 mg total) by mouth daily for 5 days. Start taking on: September 05, 2021   ipratropium-albuterol 0.5-2.5 (3) MG/3ML Soln Commonly known as: DUONEB use 1 vial by nebulization every 4 (four) hours as needed. What changed: reasons to take this   levothyroxine 150 MCG tablet Commonly known as: SYNTHROID Take 150 mcg by mouth daily before breakfast.   lidocaine 2 % solution Commonly known as: XYLOCAINE Use as directed 15 mLs in the mouth or throat as needed for mouth pain.   liver oil-zinc oxide 40 % ointment Commonly known as: DESITIN Apply topically 4 (four) times daily.   midodrine 5 MG tablet Commonly known as: PROAMATINE Take 3 tablets (15 mg total) by mouth 3 (three) times daily with meals.   oxyCODONE-acetaminophen 5-325 MG tablet Commonly known as: PERCOCET/ROXICET Take 1 tablet by mouth in the morning, at noon, and at bedtime.   polyethylene glycol 17 g packet Commonly known as: MIRALAX / GLYCOLAX Take 17 g by mouth 2 (two) times daily. Decreased to once daily if having diarrhea.   Symbicort 160-4.5 MCG/ACT inhaler Generic drug: budesonide-formoterol Inhale 2 puffs into the lungs 3 (three) times daily.   venlafaxine XR 150 MG 24 hr capsule Commonly known as: EFFEXOR-XR Take 150 mg by mouth at bedtime.                Durable Medical Equipment  (From admission, onward)           Start     Ordered   09/03/21 1402  For home use only DME Other see comment  Once       Comments: Adult diapers Bed pads  Question:  Length of Need  Answer:  6 Months   09/03/21 1402   09/03/21 1324  For home use only DME 3 n 1  Once        09/03/21 1324   09/03/21 1323  For home use only DME Walker rolling  Once       Question Answer Comment  Walker: With 5 Inch Wheels   Patient needs a walker to treat with the following condition Hypotension   Patient needs a walker to treat with the following condition Leg wound, right      09/03/21 1324            Follow-up Information     [facility] Follow up.   Specialty: [place] Why: They will contact you about sending someone out to see you in [facility] information: [place]                 Discharge Exam: Filed Weights   08/31/21 0500 09/01/21 0454 09/03/21 0500  Weight: 95.9 kg 93.8 kg 123.8 kg   General exam: Appears calm and comfortable Respiratory system: Clear to auscultation. Respiratory effort normal. Cardiovascular system: S1 & S2 heard, slightly tachycardic, normal rhythm. No murmurs, rubs, gallops or clicks. Gastrointestinal system: Abdomen is nondistended, soft and nontender. No organomegaly or masses felt. Normal bowel sounds heard. Central nervous system: Alert and oriented. No focal neurological deficits. Musculoskeletal: No edema. No calf tenderness Skin: No cyanosis. Psychiatry: Judgement and insight appear normal. Mood & affect appropriate.   Condition at discharge: fair  The results of significant diagnostics from this hospitalization (including imaging, microbiology, ancillary and laboratory) are listed below for reference.   Imaging Studies: DG Chest Port 1 View  Result Date: 08/14/2021 CLINICAL DATA:  leg swelling EXAM:  PORTABLE CHEST 1 VIEW COMPARISON:  Chest x-ray 06/16/2021, CT chest 02/28/2021 FINDINGS: The heart and mediastinal contours are within normal limits. No focal consolidation. No pulmonary edema. No pleural effusion. No pneumothorax. No acute osseous abnormality. Bilateral shoulder degenerative changes. IMPRESSION: No active disease. Electronically Signed   By: Iven Finn M.D.   On: 08/14/2021 23:05   DG Abd Portable 1V  Result Date: 08/25/2021 CLINICAL DATA:  Abdominal pain EXAM: PORTABLE ABDOMEN - 1 VIEW COMPARISON:  CT done on 06/16/2021 FINDINGS: Bowel gas pattern is nonspecific. There is no significant dilation of small-bowel loops. Stomach is not distended. Moderate to large amount of stool is present in colon. Severe degenerative changes are noted in the lower thoracic spine and lumbar spine. Surgical clips are seen in the right upper quadrant and right side of pelvis. IMPRESSION: Nonspecific bowel gas pattern. Moderate to large amount of stool is present in colon. Severe degenerative changes are noted in the lumbar spine. Electronically Signed   By: Elmer Picker M.D.   On: 08/25/2021 11:57   ECHOCARDIOGRAM COMPLETE  Result Date: 08/15/2021    ECHOCARDIOGRAM REPORT   Patient Name:   SHAUNTAVIA ECKLAND Date of Exam: 08/15/2021 Medical Rec #:  [id number]     Height:       64.0 in Accession #:    [id number]    Weight:       190.0 lb Date of Birth:  Apr 18, 1956     BSA:          1.914 m Patient Age:    74 years      BP:           102/39 mmHg Patient Gender: F             HR:           60 bpm. Exam Location:  Inpatient Procedure: 2D Echo Indications:    Acute diastolic CHF  History:        Patient has no prior history of Echocardiogram examinations.                 COPD.  Sonographer:    Arlyss Gandy Referring Phys: Blum  1. Left ventricular ejection fraction, by estimation, is 50 to 55%. The left ventricle has low normal function. The left ventricle has no regional wall  motion abnormalities. Left ventricular diastolic parameters are consistent with Grade I diastolic dysfunction (impaired relaxation).  2. Right ventricular systolic function is normal. The right ventricular size is normal. There is mildly elevated pulmonary artery  systolic pressure. The estimated right ventricular systolic pressure is 123456 mmHg.  3. The mitral valve is normal in structure. No evidence of mitral valve regurgitation.  4. The aortic valve was not well visualized. Aortic valve regurgitation is not visualized. No aortic stenosis is present.  5. The inferior vena cava is dilated in size with >50% respiratory variability, suggesting right atrial pressure of 8 mmHg. Comparison(s): No prior Echocardiogram. FINDINGS  Left Ventricle: Left ventricular ejection fraction, by estimation, is 50 to 55%. The left ventricle has low normal function. The left ventricle has no regional wall motion abnormalities. The left ventricular internal cavity size was normal in size. There is no left ventricular hypertrophy. Left ventricular diastolic parameters are consistent with Grade I diastolic dysfunction (impaired relaxation). Right Ventricle: The right ventricular size is normal. No increase in right ventricular wall thickness. Right ventricular systolic function is normal. There is mildly elevated pulmonary artery systolic pressure. The tricuspid regurgitant velocity is 2.69  m/s, and with an assumed right atrial pressure of 8 mmHg, the estimated right ventricular systolic pressure is 123456 mmHg. Left Atrium: Left atrial size was normal in size. Right Atrium: Right atrial size was normal in size. Pericardium: Trivial pericardial effusion is present. Presence of epicardial fat layer. Mitral Valve: The mitral valve is normal in structure. No evidence of mitral valve regurgitation. Tricuspid Valve: The tricuspid valve is normal in structure. Tricuspid valve regurgitation is mild . No evidence of tricuspid stenosis. Aortic  Valve: The aortic valve was not well visualized. Aortic valve regurgitation is not visualized. No aortic stenosis is present. Aortic valve mean gradient measures 4.0 mmHg. Aortic valve peak gradient measures 7.5 mmHg. Aortic valve area, by VTI measures 1.97 cm. Pulmonic Valve: The pulmonic valve was not well visualized. Pulmonic valve regurgitation is not visualized. No evidence of pulmonic stenosis. Aorta: The aortic root and ascending aorta are structurally normal, with no evidence of dilitation. Venous: The inferior vena cava is dilated in size with greater than 50% respiratory variability, suggesting right atrial pressure of 8 mmHg. IAS/Shunts: No atrial level shunt detected by color flow Doppler.  LEFT VENTRICLE PLAX 2D LVIDd:         5.30 cm   Diastology LVIDs:         4.80 cm   LV e' medial:    10.40 cm/s LV PW:         0.70 cm   LV E/e' medial:  6.2 LV IVS:        0.70 cm   LV e' lateral:   13.10 cm/s LVOT diam:     2.00 cm   LV E/e' lateral: 4.9 LV SV:         51 LV SV Index:   26 LVOT Area:     3.14 cm  RIGHT VENTRICLE             IVC RV Basal diam:  3.00 cm     IVC diam: 2.30 cm RV S prime:     12.20 cm/s TAPSE (M-mode): 1.6 cm LEFT ATRIUM             Index        RIGHT ATRIUM           Index LA diam:        2.80 cm 1.46 cm/m   RA Area:     11.40 cm LA Vol (A2C):   30.8 ml 16.09 ml/m  RA Volume:   24.80 ml  12.96 ml/m LA Vol (  A4C):   41.3 ml 21.58 ml/m LA Biplane Vol: 38.7 ml 20.22 ml/m  AORTIC VALVE AV Area (Vmax):    1.94 cm AV Area (Vmean):   1.85 cm AV Area (VTI):     1.97 cm AV Vmax:           137.00 cm/s AV Vmean:          92.200 cm/s AV VTI:            0.257 m AV Peak Grad:      7.5 mmHg AV Mean Grad:      4.0 mmHg LVOT Vmax:         84.50 cm/s LVOT Vmean:        54.400 cm/s LVOT VTI:          0.161 m LVOT/AV VTI ratio: 0.63  AORTA Ao Root diam: 2.70 cm Ao Asc diam:  2.30 cm MITRAL VALVE               TRICUSPID VALVE MV Area (PHT): 3.89 cm    TR Peak grad:   28.9 mmHg MV Decel Time: 195  msec    TR Vmax:        269.00 cm/s MV E velocity: 64.70 cm/s MV A velocity: 84.40 cm/s  SHUNTS MV E/A ratio:  0.77        Systemic VTI:  0.16 m                            Systemic Diam: 2.00 cm Rudean Haskell MD Electronically signed by Rudean Haskell MD Signature Date/Time: 08/15/2021/4:01:40 PM    Final    CT EXTREMITY LOWER RIGHT W CONTRAST  Result Date: 08/15/2021 CLINICAL DATA:  Status post trauma to the right leg 1 month ago with posterior weeping wound. EXAM: CT OF THE LOWER RIGHT EXTREMITY WITH CONTRAST TECHNIQUE: Multidetector CT imaging of the lower right extremity was performed according to the standard protocol following intravenous contrast administration. RADIATION DOSE REDUCTION: This exam was performed according to the departmental dose-optimization program which includes automated exposure control, adjustment of the mA and/or kV according to patient size and/or use of iterative reconstruction technique. CONTRAST:  74mL OMNIPAQUE IOHEXOL 350 MG/ML SOLN COMPARISON:  None. FINDINGS: Bones/Joint/Cartilage There is no evidence of acute fracture or dislocation. Marked severity medial and lateral marginal osteophytes are present. Marked severity medial and lateral tibiofemoral joint space narrowing is seen. Marked severity patellofemoral narrowing is also noted. A small joint effusion is present. Ligaments Suboptimally assessed by CT. Muscles and Tendons Moderate severity perimuscular inflammatory fat stranding is seen along the posterior aspect of the proximal to mid right lower extremity. Soft tissues Approximately 3.8 cm x 1.7 cm x 1.6 cm and 3.4 cm x 2.5 cm x 1.4 cm soft tissue defects are seen along the posterior aspect of the proximal to mid right lower extremity. These defects both extend to the level of the musculature of the posterior right leg. Marked severity adjacent deep and subcutaneous inflammatory fat stranding is also seen which extends inferiorly along the posterior aspect  of the right leg to the distal aspect of the right calf. Cutaneous thickening is also present. Moderate to marked severity subcutaneous inflammatory fat stranding is also seen along the anterior aspects of the mid and distal right tibia and right fibula. No organized fluid collections or abscesses are identified. IMPRESSION: 1. Large soft tissue defects along the posterior aspect of the proximal right leg with  associated marked severity cellulitis, as described above. 2. Marked severity cellulitis within the soft tissues anterior to the right tibia and right fibula. 3. No evidence of an organized fluid collection or abscess. 4. Marked severity chronic and degenerative changes involving the right knee. 5. Small joint effusion. Electronically Signed   By: Virgina Norfolk M.D.   On: 08/15/2021 03:21   VAS [place] UPPER EXTREMITY VENOUS DUPLEX  Result Date: 08/28/2021 UPPER VENOUS STUDY  Patient Name:  ANDE GOEWEY  Date of Exam:   08/27/2021 Medical Rec #: [id number]      Accession #:    [id number] Date of Birth: 28-Aug-1955      Patient Gender: F Patient Age:   3 years Exam Location:  [facility] Procedure:      VAS [place] UPPER EXTREMITY VENOUS DUPLEX Referring Phys: STEPHEN CHIU --------------------------------------------------------------------------------  Indications: Edema Limitations: Line and bandages. Comparison Study: No previous exams Performing Technologist: Jody Hill RVT, RDMS  Examination Guidelines: A complete evaluation includes B-mode imaging, spectral Doppler, color Doppler, and power Doppler as needed of all accessible portions of each vessel. Bilateral testing is considered an integral part of a complete examination. Limited examinations for reoccurring indications may be performed as noted.  Right Findings: +----------+------------+---------+-----------+----------+-------+  RIGHT      Compressible Phasicity Spontaneous Properties Summary   +----------+------------+---------+-----------+----------+-------+  IJV            Full        Yes        Yes                         +----------+------------+---------+-----------+----------+-------+  Subclavian     Full        Yes        Yes                         +----------+------------+---------+-----------+----------+-------+  Axillary       Full        Yes        Yes                         +----------+------------+---------+-----------+----------+-------+  Brachial       Full        Yes        Yes                         +----------+------------+---------+-----------+----------+-------+  Radial         Full                                               +----------+------------+---------+-----------+----------+-------+  Ulnar          Full                                               +----------+------------+---------+-----------+----------+-------+  Cephalic       Full                                               +----------+------------+---------+-----------+----------+-------+  Basilic        Full        Yes        Yes                         +----------+------------+---------+-----------+----------+-------+ Brachial and basilic veins were difficult to visualize due to PICC line and bandage. Only one of paired brachial veins seen on this exam.  Left Findings: +----------+------------+---------+-----------+----------+-------+  LEFT       Compressible Phasicity Spontaneous Properties Summary  +----------+------------+---------+-----------+----------+-------+  Subclavian     Full        Yes        Yes                         +----------+------------+---------+-----------+----------+-------+  Summary:  Right: No evidence of deep vein thrombosis in the upper extremity. No evidence of superficial vein thrombosis in the upper extremity.  Left: No evidence of thrombosis in the subclavian.  *See table(s) above for measurements and observations.  Diagnosing physician: Jamelle Haring Electronically signed by Jamelle Haring on 08/28/2021 at 5:06:03 AM.    Final    [place] EKG SITE RITE  Result Date: 08/15/2021 If Site Rite image not attached, placement could not be confirmed due to current cardiac rhythm.  [place] EKG SITE RITE  Result Date: 08/15/2021 If Site Rite image not attached, placement could not be confirmed due to current cardiac rhythm.   Microbiology: Results for orders placed or performed during the hospital encounter of 08/14/21  Blood culture (routine x 2)     Status: None   Collection Time: 08/15/21 12:07 AM   Specimen: BLOOD  Result Value Ref Range Status   Specimen Description   Final    BLOOD BLOOD LEFT FOREARM Performed at [facility] [address] [place], [place] [address]    Special Requests   Final    BOTTLES DRAWN AEROBIC AND ANAEROBIC Blood Culture adequate volume Performed at [facility] [address] [place], [place] [address]    Culture   Final    NO GROWTH 5 DAYS Performed at [facility], [place] [address] [place], [place] [address]    Report Status 08/20/2021 FINAL  Final  Blood culture (routine x 2)     Status: None   Collection Time: 08/15/21 12:07 AM   Specimen: BLOOD  Result Value Ref Range Status   Specimen Description   Final    BLOOD LEFT ANTECUBITAL Performed at [facility] [address], [place], [place] [address]    Special Requests   Final    BOTTLES DRAWN AEROBIC AND ANAEROBIC Blood Culture adequate volume Performed at [facility] [address], [place], [place] [address]    Culture   Final    NO GROWTH 5 DAYS Performed at [facility], [place] [address], [place],  [address]    Report Status 08/20/2021 FINAL  Final  Resp Panel by RT-PCR (Flu A&B, Covid) Nasopharyngeal Swab     Status: None   Collection Time: 08/15/21 12:07 AM   Specimen: Nasopharyngeal Swab; Nasopharyngeal(NP) swabs in vial transport medium  Result Value Ref Range Status   SARS  Coronavirus 2 by RT PCR NEGATIVE NEGATIVE Final    Comment: (NOTE) SARS-CoV-2 target nucleic acids are NOT DETECTED.  The SARS-CoV-2 RNA is generally detectable in upper respiratory specimens during the acute phase of infection. The  lowest concentration of SARS-CoV-2 viral copies this assay can detect is 138 copies/mL. A negative result does not preclude SARS-Cov-2 infection and should not be used as the sole basis for treatment or other patient management decisions. A negative result may occur with  improper specimen collection/handling, submission of specimen other than nasopharyngeal swab, presence of viral mutation(s) within the areas targeted by this assay, and inadequate number of viral copies(<138 copies/mL). A negative result must be combined with clinical observations, patient history, and epidemiological information. The expected result is Negative.  Fact Sheet for Patients:  EntrepreneurPulse.com.au  Fact Sheet for Healthcare Providers:  IncredibleEmployment.be  This test is no t yet approved or cleared by the [place] FDA and  has been authorized for detection and/or diagnosis of SARS-CoV-2 by FDA under an Emergency Use Authorization (EUA). This EUA will remain  in effect (meaning this test can be used) for the duration of the COVID-19 declaration under Section 564(b)(1) of the Act, 21 U.S.C.section 360bbb-3(b)(1), unless the authorization is terminated  or revoked sooner.       Influenza A by PCR NEGATIVE NEGATIVE Final   Influenza B by PCR NEGATIVE NEGATIVE Final    Comment: (NOTE) The Xpert Xpress SARS-CoV-2/FLU/RSV plus assay is intended as an aid in the diagnosis of influenza from Nasopharyngeal swab specimens and should not be used as a sole basis for treatment. Nasal washings and aspirates are unacceptable for Xpert Xpress SARS-CoV-2/FLU/RSV testing.  Fact Sheet for  Patients: EntrepreneurPulse.com.au  Fact Sheet for Healthcare Providers: IncredibleEmployment.be  This test is not yet approved or cleared by the [place] FDA and has been authorized for detection and/or diagnosis of SARS-CoV-2 by FDA under an Emergency Use Authorization (EUA). This EUA will remain in effect (meaning this test can be used) for the duration of the COVID-19 declaration under Section 564(b)(1) of the Act, 21 U.S.C. section 360bbb-3(b)(1), unless the authorization is terminated or revoked.  Performed at [facility], [place] [address] [place], [place] [address]   MRSA Next Gen by PCR, Nasal     Status: Abnormal   Collection Time: 08/15/21  2:55 PM   Specimen: Nasal Mucosa; Nasal Swab  Result Value Ref Range Status   MRSA by PCR Next Gen DETECTED (A) NOT DETECTED Final    Comment: RESULT CALLED TO, READ BACK BY AND VERIFIED WITH: GARLAND,G. 08/15/21 @2143  BY SEEL,M. (NOTE) The GeneXpert MRSA Assay (FDA approved for NASAL specimens only), is one component of a comprehensive MRSA colonization surveillance program. It is not intended to diagnose MRSA infection nor to guide or monitor treatment for MRSA infections. Test performance is not FDA approved in patients less than 59 years old. Performed at [facility], [place] [address] [place], [place] [address]     Labs: CBC: Recent Labs  Lab 08/30/21 0500 08/31/21 0456 09/01/21 0450 09/02/21 0403 09/04/21 0635  WBC 9.1 7.9 8.3 7.0 9.3  HGB 7.7* 7.7* 8.3* 7.3* 7.7*  HCT 26.6* 26.4* 28.5* 25.3* 26.3*  MCV 89.9 87.4 87.4 88.5 86.2  PLT 529* 506* 538* 495* [id number]*   Basic Metabolic Panel: Recent Labs  Lab 08/29/21 0600 08/30/21 0500 08/31/21 0456 09/01/21 0450 09/02/21 0403 09/04/21 0635  NA 138 135 133* 135 138  --   K 4.0 3.6 3.8 3.3* 3.8 4.0  CL 95* 93* 91* 92* 99  --   CO2 37* 34* 35* 36* 34*  --   GLUCOSE 156* 134* 93 108* 105*   --   BUN 22  25* 27* 24* 24*  --   CREATININE 0.59 0.48 0.67 0.65 0.42*  --   CALCIUM 8.4* 8.3* 8.2* 8.5* 8.4*  --   MG  --   --   --   --   --  2.1   Liver Function Tests: Recent Labs  Lab 08/29/21 0600 08/30/21 0500 08/31/21 0456 09/01/21 0450 09/02/21 0403  AST 15 16 19 22 20   ALT 23 20 22 28 26   ALKPHOS 57 67 69 85 79  BILITOT 0.4 0.4 0.6 0.4 0.2*  PROT 5.6* 5.8* 5.2* 5.6* 5.4*  ALBUMIN 2.6* 2.8* 2.5* 2.6* 2.4*   CBG: Recent Labs  Lab 08/30/21 1549 08/30/21 2123 08/30/21 2132 08/31/21 0749 08/31/21 1239  GLUCAP 108* 91 103* 73   58* 137*    Discharge time spent: 35 minutes.  Signed: Cordelia Poche, MD [facility] 09/04/2021

## 2021-09-05 ENCOUNTER — Inpatient Hospital Stay (HOSPITAL_COMMUNITY): Payer: Medicare Other

## 2021-09-05 ENCOUNTER — Encounter (HOSPITAL_COMMUNITY): Payer: Self-pay | Admitting: Emergency Medicine

## 2021-09-05 ENCOUNTER — Emergency Department (HOSPITAL_COMMUNITY): Payer: Medicare Other

## 2021-09-05 ENCOUNTER — Inpatient Hospital Stay (HOSPITAL_COMMUNITY)
Admission: EM | Admit: 2021-09-05 | Discharge: 2021-09-15 | DRG: 871 | Disposition: A | Discharge status: Skilled Nursing Facility | Payer: Medicare Other | Attending: Internal Medicine | Admitting: Internal Medicine

## 2021-09-05 ENCOUNTER — Other Ambulatory Visit: Payer: Self-pay

## 2021-09-05 DIAGNOSIS — F1120 Opioid dependence, uncomplicated: Secondary | ICD-10-CM | POA: Diagnosis present

## 2021-09-05 DIAGNOSIS — E785 Hyperlipidemia, unspecified: Secondary | ICD-10-CM | POA: Diagnosis not present

## 2021-09-05 DIAGNOSIS — L0390 Cellulitis, unspecified: Secondary | ICD-10-CM

## 2021-09-05 DIAGNOSIS — F32A Depression, unspecified: Secondary | ICD-10-CM | POA: Diagnosis present

## 2021-09-05 DIAGNOSIS — E7800 Pure hypercholesterolemia, unspecified: Secondary | ICD-10-CM | POA: Diagnosis present

## 2021-09-05 DIAGNOSIS — L03115 Cellulitis of right lower limb: Secondary | ICD-10-CM | POA: Diagnosis present

## 2021-09-05 DIAGNOSIS — Z602 Problems related to living alone: Secondary | ICD-10-CM | POA: Diagnosis present

## 2021-09-05 DIAGNOSIS — E039 Hypothyroidism, unspecified: Secondary | ICD-10-CM | POA: Diagnosis present

## 2021-09-05 DIAGNOSIS — R6521 Severe sepsis with septic shock: Secondary | ICD-10-CM | POA: Diagnosis not present

## 2021-09-05 DIAGNOSIS — I9589 Other hypotension: Secondary | ICD-10-CM | POA: Diagnosis present

## 2021-09-05 DIAGNOSIS — E876 Hypokalemia: Secondary | ICD-10-CM | POA: Diagnosis present

## 2021-09-05 DIAGNOSIS — K08109 Complete loss of teeth, unspecified cause, unspecified class: Secondary | ICD-10-CM | POA: Diagnosis present

## 2021-09-05 DIAGNOSIS — E669 Obesity, unspecified: Secondary | ICD-10-CM | POA: Diagnosis present

## 2021-09-05 DIAGNOSIS — L8990 Pressure ulcer of unspecified site, unspecified stage: Secondary | ICD-10-CM | POA: Diagnosis present

## 2021-09-05 DIAGNOSIS — D72829 Elevated white blood cell count, unspecified: Secondary | ICD-10-CM | POA: Diagnosis present

## 2021-09-05 DIAGNOSIS — L89312 Pressure ulcer of right buttock, stage 2: Secondary | ICD-10-CM | POA: Diagnosis present

## 2021-09-05 DIAGNOSIS — Z20822 Contact with and (suspected) exposure to covid-19: Secondary | ICD-10-CM | POA: Diagnosis present

## 2021-09-05 DIAGNOSIS — F17210 Nicotine dependence, cigarettes, uncomplicated: Secondary | ICD-10-CM | POA: Diagnosis present

## 2021-09-05 DIAGNOSIS — L89619 Pressure ulcer of right heel, unspecified stage: Secondary | ICD-10-CM | POA: Diagnosis present

## 2021-09-05 DIAGNOSIS — L89322 Pressure ulcer of left buttock, stage 2: Secondary | ICD-10-CM | POA: Diagnosis present

## 2021-09-05 DIAGNOSIS — F419 Anxiety disorder, unspecified: Secondary | ICD-10-CM | POA: Diagnosis present

## 2021-09-05 DIAGNOSIS — I10 Essential (primary) hypertension: Secondary | ICD-10-CM | POA: Diagnosis present

## 2021-09-05 DIAGNOSIS — K219 Gastro-esophageal reflux disease without esophagitis: Secondary | ICD-10-CM | POA: Diagnosis present

## 2021-09-05 DIAGNOSIS — Z6834 Body mass index (BMI) 34.0-34.9, adult: Secondary | ICD-10-CM

## 2021-09-05 DIAGNOSIS — S81801S Unspecified open wound, right lower leg, sequela: Secondary | ICD-10-CM

## 2021-09-05 DIAGNOSIS — B888 Other specified infestations: Secondary | ICD-10-CM | POA: Diagnosis present

## 2021-09-05 DIAGNOSIS — Z66 Do not resuscitate: Secondary | ICD-10-CM | POA: Diagnosis present

## 2021-09-05 DIAGNOSIS — R627 Adult failure to thrive: Secondary | ICD-10-CM | POA: Diagnosis present

## 2021-09-05 DIAGNOSIS — J449 Chronic obstructive pulmonary disease, unspecified: Secondary | ICD-10-CM | POA: Diagnosis present

## 2021-09-05 DIAGNOSIS — Z91199 Patient's noncompliance with other medical treatment and regimen due to unspecified reason: Secondary | ICD-10-CM

## 2021-09-05 DIAGNOSIS — E871 Hypo-osmolality and hyponatremia: Principal | ICD-10-CM | POA: Diagnosis present

## 2021-09-05 DIAGNOSIS — A419 Sepsis, unspecified organism: Principal | ICD-10-CM | POA: Diagnosis present

## 2021-09-05 DIAGNOSIS — G4730 Sleep apnea, unspecified: Secondary | ICD-10-CM | POA: Diagnosis present

## 2021-09-05 DIAGNOSIS — L089 Local infection of the skin and subcutaneous tissue, unspecified: Secondary | ICD-10-CM

## 2021-09-05 DIAGNOSIS — T148XXA Other injury of unspecified body region, initial encounter: Secondary | ICD-10-CM

## 2021-09-05 DIAGNOSIS — D638 Anemia in other chronic diseases classified elsewhere: Secondary | ICD-10-CM | POA: Diagnosis present

## 2021-09-05 DIAGNOSIS — Z79899 Other long term (current) drug therapy: Secondary | ICD-10-CM

## 2021-09-05 DIAGNOSIS — Z91198 Patient's noncompliance with other medical treatment and regimen for other reason: Secondary | ICD-10-CM

## 2021-09-05 HISTORY — DX: Sepsis, unspecified organism: A41.9

## 2021-09-05 LAB — URINALYSIS, ROUTINE W REFLEX MICROSCOPIC
Bilirubin Urine: NEGATIVE
Glucose, UA: NEGATIVE mg/dL
Hgb urine dipstick: NEGATIVE
Ketones, ur: NEGATIVE mg/dL
Nitrite: NEGATIVE
Protein, ur: NEGATIVE mg/dL
Specific Gravity, Urine: <1.005 — ABNORMAL LOW (ref 1.005–1.030)
pH: 6.5 (ref 5.0–8.0)

## 2021-09-05 LAB — COMPREHENSIVE METABOLIC PANEL
ALT: 26 U/L (ref 0–44)
AST: 25 U/L (ref 15–41)
Albumin: 2.7 g/dL — ABNORMAL LOW (ref 3.5–5.0)
Alkaline Phosphatase: 119 U/L (ref 38–126)
Anion gap: 9 (ref 5–15)
BUN: 13 mg/dL (ref 8–23)
CO2: 27 mmol/L (ref 22–32)
Calcium: 8.1 mg/dL — ABNORMAL LOW (ref 8.9–10.3)
Chloride: 92 mmol/L — ABNORMAL LOW (ref 98–111)
Creatinine, Ser: 0.58 mg/dL (ref 0.44–1.00)
GFR, Estimated: >60 mL/min (ref >60)
Glucose, Bld: 115 mg/dL — ABNORMAL HIGH (ref 70–99)
Potassium: 4 mmol/L (ref 3.5–5.1)
Sodium: 128 mmol/L — ABNORMAL LOW (ref 135–145)
Total Bilirubin: 0.5 mg/dL (ref 0.3–1.2)
Total Protein: 6 g/dL — ABNORMAL LOW (ref 6.5–8.1)

## 2021-09-05 LAB — CBC WITH DIFFERENTIAL/PLATELET
Abs Immature Granulocytes: 0.1 10*3/uL — ABNORMAL HIGH (ref 0.00–0.07)
Basophils Absolute: 0.1 10*3/uL (ref 0.0–0.1)
Basophils Relative: 0 %
Eosinophils Absolute: 0.1 10*3/uL (ref 0.0–0.5)
Eosinophils Relative: 0 %
HCT: 28 % — ABNORMAL LOW (ref 36.0–46.0)
Hemoglobin: 8.3 g/dL — ABNORMAL LOW (ref 12.0–15.0)
Immature Granulocytes: 1 %
Lymphocytes Relative: 23 %
Lymphs Abs: 3.2 10*3/uL (ref 0.7–4.0)
MCH: 25.5 pg — ABNORMAL LOW (ref 26.0–34.0)
MCHC: 29.6 g/dL — ABNORMAL LOW (ref 30.0–36.0)
MCV: 85.9 fL (ref 80.0–100.0)
Monocytes Absolute: 0.5 10*3/uL (ref 0.1–1.0)
Monocytes Relative: 4 %
Neutro Abs: 9.9 10*3/uL — ABNORMAL HIGH (ref 1.7–7.7)
Neutrophils Relative %: 72 %
Platelets: 460 10*3/uL — ABNORMAL HIGH (ref 150–400)
RBC: 3.26 MIL/uL — ABNORMAL LOW (ref 3.87–5.11)
RDW: 21.6 % — ABNORMAL HIGH (ref 11.5–15.5)
WBC: 13.8 10*3/uL — ABNORMAL HIGH (ref 4.0–10.5)
nRBC: 0 % (ref 0.0–0.2)

## 2021-09-05 LAB — MAGNESIUM: Magnesium: 2 mg/dL (ref 1.7–2.4)

## 2021-09-05 LAB — PROTIME-INR
INR: 1 (ref 0.8–1.2)
Prothrombin Time: 13.3 seconds (ref 11.4–15.2)

## 2021-09-05 LAB — RESP PANEL BY RT-PCR (FLU A&B, COVID) ARPGX2
Influenza A by PCR: NEGATIVE
Influenza B by PCR: NEGATIVE
SARS Coronavirus 2 by RT PCR: NEGATIVE

## 2021-09-05 LAB — URINALYSIS, MICROSCOPIC (REFLEX): RBC / HPF: NONE SEEN RBC/hpf (ref 0–5)

## 2021-09-05 LAB — PROCALCITONIN: Procalcitonin: <0.1 ng/mL

## 2021-09-05 LAB — LACTIC ACID, PLASMA
Lactic Acid, Venous: 1.4 mmol/L (ref 0.5–1.9)
Lactic Acid, Venous: 1.9 mmol/L (ref 0.5–1.9)

## 2021-09-05 MED ORDER — VANCOMYCIN HCL 2000 MG/400ML IV SOLN
2000.0000 mg | Freq: Once | INTRAVENOUS | Status: AC
  Administered 2021-09-05: 2000 mg via INTRAVENOUS
  Filled 2021-09-05: qty 400

## 2021-09-05 MED ORDER — METRONIDAZOLE 500 MG/100ML IV SOLN
500.0000 mg | Freq: Once | INTRAVENOUS | Status: AC
  Administered 2021-09-05: 500 mg via INTRAVENOUS
  Filled 2021-09-05: qty 100

## 2021-09-05 MED ORDER — MIDODRINE HCL 5 MG PO TABS: 10.0000 mg | ORAL_TABLET | Freq: Once | ORAL | Status: DC

## 2021-09-05 MED ORDER — HYDROCORTISONE 5 MG PO TABS
5.0000 mg | ORAL_TABLET | Freq: Every day | ORAL | Status: DC
  Administered 2021-09-05: 5 mg via ORAL
  Filled 2021-09-05 (×3): qty 1

## 2021-09-05 MED ORDER — ACETAMINOPHEN 325 MG PO TABS
650.0000 mg | ORAL_TABLET | Freq: Four times a day (QID) | ORAL | Status: DC | PRN
  Administered 2021-09-05 – 2021-09-11 (×4): 650 mg via ORAL
  Filled 2021-09-05 (×4): qty 2

## 2021-09-05 MED ORDER — LEVOTHYROXINE SODIUM 50 MCG PO TABS
150.0000 ug | ORAL_TABLET | Freq: Every day | ORAL | Status: DC
  Administered 2021-09-06 – 2021-09-15 (×10): 150 ug via ORAL
  Filled 2021-09-05 (×7): qty 1
  Filled 2021-09-05: qty 2
  Filled 2021-09-05 (×2): qty 1

## 2021-09-05 MED ORDER — LACTATED RINGERS IV SOLN: INTRAVENOUS | Status: DC

## 2021-09-05 MED ORDER — ACETAMINOPHEN 325 MG PO TABS
650.0000 mg | ORAL_TABLET | Freq: Once | ORAL | Status: AC
  Administered 2021-09-05: 650 mg via ORAL
  Filled 2021-09-05: qty 2

## 2021-09-05 MED ORDER — ATORVASTATIN CALCIUM 10 MG PO TABS
10.0000 mg | ORAL_TABLET | Freq: Every day | ORAL | Status: DC
  Administered 2021-09-05 – 2021-09-15 (×11): 10 mg via ORAL
  Filled 2021-09-05 (×12): qty 1

## 2021-09-05 MED ORDER — LACTATED RINGERS IV BOLUS (SEPSIS)
1000.0000 mL | Freq: Once | INTRAVENOUS | Status: AC
  Administered 2021-09-05: 1000 mL via INTRAVENOUS

## 2021-09-05 MED ORDER — NOREPINEPHRINE 4 MG/250ML-% IV SOLN
0.0000 ug/min | INTRAVENOUS | Status: DC
  Administered 2021-09-05: 2 ug/min via INTRAVENOUS
  Administered 2021-09-06: 4 ug/min via INTRAVENOUS
  Filled 2021-09-05 (×2): qty 250

## 2021-09-05 MED ORDER — IPRATROPIUM-ALBUTEROL 0.5-2.5 (3) MG/3ML IN SOLN: 3.0000 mL | RESPIRATORY_TRACT | Status: DC | PRN

## 2021-09-05 MED ORDER — POLYETHYLENE GLYCOL 3350 17 G PO PACK: 17.0000 g | PACK | Freq: Every day | ORAL | Status: DC | PRN

## 2021-09-05 MED ORDER — ONDANSETRON HCL 4 MG/2ML IJ SOLN
4.0000 mg | Freq: Four times a day (QID) | INTRAMUSCULAR | Status: DC | PRN
  Administered 2021-09-05 – 2021-09-15 (×22): 4 mg via INTRAVENOUS
  Filled 2021-09-05 (×25): qty 2

## 2021-09-05 MED ORDER — METRONIDAZOLE 500 MG/100ML IV SOLN
500.0000 mg | Freq: Two times a day (BID) | INTRAVENOUS | Status: DC
  Administered 2021-09-05 – 2021-09-07 (×4): 500 mg via INTRAVENOUS
  Filled 2021-09-05 (×4): qty 100

## 2021-09-05 MED ORDER — HEPARIN SODIUM (PORCINE) 5000 UNIT/ML IJ SOLN
5000.0000 [IU] | Freq: Three times a day (TID) | INTRAMUSCULAR | Status: DC
  Administered 2021-09-05 – 2021-09-15 (×30): 5000 [IU] via SUBCUTANEOUS
  Filled 2021-09-05 (×30): qty 1

## 2021-09-05 MED ORDER — VANCOMYCIN HCL IN DEXTROSE 1-5 GM/200ML-% IV SOLN
1000.0000 mg | Freq: Two times a day (BID) | INTRAVENOUS | Status: DC
  Administered 2021-09-06 – 2021-09-08 (×6): 1000 mg via INTRAVENOUS
  Filled 2021-09-05 (×6): qty 200

## 2021-09-05 MED ORDER — ENSURE ENLIVE PO LIQD
237.0000 mL | ORAL | Status: DC
  Administered 2021-09-06: 237 mL via ORAL

## 2021-09-05 MED ORDER — NOREPINEPHRINE 4 MG/250ML-% IV SOLN: 0.0000 ug/min | INTRAVENOUS | Status: DC

## 2021-09-05 MED ORDER — ACETAMINOPHEN 650 MG RE SUPP: 650.0000 mg | Freq: Four times a day (QID) | RECTAL | Status: DC | PRN

## 2021-09-05 MED ORDER — MIDODRINE HCL 5 MG PO TABS
15.0000 mg | ORAL_TABLET | Freq: Three times a day (TID) | ORAL | Status: DC
  Administered 2021-09-05 – 2021-09-15 (×27): 15 mg via ORAL
  Filled 2021-09-05 (×29): qty 3

## 2021-09-05 MED ORDER — SODIUM CHLORIDE 0.9 % IV SOLN: 250.0000 mL | INTRAVENOUS | Status: DC

## 2021-09-05 MED ORDER — ONDANSETRON HCL 4 MG PO TABS
4.0000 mg | ORAL_TABLET | Freq: Four times a day (QID) | ORAL | Status: DC | PRN
  Administered 2021-09-08 – 2021-09-09 (×2): 4 mg via ORAL
  Filled 2021-09-05 (×3): qty 1

## 2021-09-05 MED ORDER — SODIUM CHLORIDE 0.9 % IV SOLN
2.0000 g | Freq: Once | INTRAVENOUS | Status: AC
  Administered 2021-09-05: 2 g via INTRAVENOUS
  Filled 2021-09-05: qty 2

## 2021-09-05 MED ORDER — SODIUM CHLORIDE 0.9 % IV SOLN: INTRAVENOUS | Status: AC

## 2021-09-05 MED ORDER — SODIUM CHLORIDE 0.9 % IV SOLN
2.0000 g | Freq: Three times a day (TID) | INTRAVENOUS | Status: DC
  Administered 2021-09-05 – 2021-09-07 (×5): 2 g via INTRAVENOUS
  Filled 2021-09-05 (×6): qty 2

## 2021-09-05 MED ORDER — MORPHINE SULFATE (PF) 2 MG/ML IV SOLN
2.0000 mg | INTRAVENOUS | Status: DC | PRN
  Administered 2021-09-05 – 2021-09-15 (×31): 2 mg via INTRAVENOUS
  Filled 2021-09-05 (×32): qty 1

## 2021-09-05 MED ORDER — IOHEXOL 300 MG/ML  SOLN
100.0000 mL | Freq: Once | INTRAMUSCULAR | Status: AC | PRN
  Administered 2021-09-05: 100 mL via INTRAVENOUS

## 2021-09-05 MED ORDER — VANCOMYCIN HCL IN DEXTROSE 1-5 GM/200ML-% IV SOLN: 1000.0000 mg | Freq: Once | INTRAVENOUS | Status: DC

## 2021-09-05 NOTE — ED Notes (Signed)
Patient has 8 inch would to right poster leg with tunneling and yellow eschar. Wounds cleansed and prepared with wet to dry dressing and covered with ABD pad. Patient also has sacral meppaplex. Perineum care provided prior to purewick. Patient also has wound to right poster hell and wound care provided. Patient repositioned in bed with pillow placed underneath legs and heels.

## 2021-09-05 NOTE — Progress Notes (Signed)
Elink following code sepsis °

## 2021-09-05 NOTE — Progress Notes (Signed)
Pharmacy Antibiotic Note  [name] is a 66 y.o. female admitted on 09/05/2021 with  unknown source .  Pharmacy has been consulted for Vancomycin and Cefepime dosing.  Plan: Vancomycin 2000mg  IV loading dose, then 1000 mg IV Q 12 hrs. Goal AUC 400-550. Expected AUC: 463 SCr used: 0.8 (actual is 0.56) Cefepime 2gm IV q8h F/U cxs and clinical progress Monitor V/S, labs and levels as indicated  Height: 5\' 5"  (165.1 cm) Weight: 94.3 kg (208 lb) IBW/kg (Calculated) : 57  Temp (24hrs), Avg:99.5 F (37.5 C), Min:98.3 F (36.8 C), Max:101.6 F (38.7 C)  Recent Labs  Lab 08/30/21 0500 08/31/21 0456 09/01/21 0450 09/02/21 0403 09/04/21 0635 09/05/21 1041 09/05/21 1158  WBC 9.1 7.9 8.3 7.0 9.3 13.8*  --   CREATININE 0.48 0.67 0.65 0.42*  --  0.58  --   LATICACIDVEN  --   --   --   --   --  1.4 1.9    Estimated Creatinine Clearance: 79.6 mL/min (by C-G formula based on SCr of 0.58 mg/dL).    Allergies  Allergen Reactions   Gemfibrozil     Other reaction(s): Sick to stomach   Nitroglycerin     Other reaction(s): Cough, Irritation   Other Other (See Comments)    Grape fruit juice - unknown   Pravastatin     Other reaction(s): Dizzy   Sulfamethoxazole-Trimethoprim     Other reaction(s): N/V   Tyloxapol     Other reaction(s): Does not work    Antimicrobials this admission: Vancomycin 2/4 >>  Cefepime  2/4 >>  Flagyl 2/4>.  Microbiology results: 2/4 BCx: pending 2/4 UCx: pending   MRSA PCR:   Thank you for allowing pharmacy to be a part of this patients care.  11/03/21, BS 11/03/21, Elder Cyphers Clinical Pharmacist Pager [phone] 09/05/2021 12:33 PM

## 2021-09-05 NOTE — H&P (Addendum)
History and Physical    Patient: [name] [id number] DOB: [birth year] DOA: 09/05/2021 DOS: the patient was seen and examined on 09/05/2021 PCP: Crist Infante, MD  Patient coming from: Home  Chief Complaint:  Chief Complaint  Patient presents with   Wound Infection    HPI: [name] is a 66 y.o. female with medical history significant for COPD, depression, hypertension, hypothyroidism, who presented to the ED with complaints of feeling ill.   Recent prolonged hospitalization 1/13-2/3, discharged yesterday managed for septic shock secondary to right lower extremity cellulitis. CT During hospitalization showed marked severity cellulitis, without evidence of fluid collection or abscess.   She was treated with broad-spectrum antibiotics with IV Vanco,she completed 10 days.  ID was consulted and recommended discontinuing antibiotics. General surgery was consulted, patient did not need any surgical intervention or debridement. She also had hypotension which was persistent for which she was weaned off Florinef and placed on midodrine.  Patient was discharged home yesterday, came back today reporting worsening pain in her right leg, with nausea.  This is patient's third hospitalization for same.  ED course-temperature 101.6.  Heart rate 78-119.  Respiratory 16-24.  Blood pressure down to 80/38, improved to [id number] systolic after 2L fluid boluses and IV norepinephrine started. Femur x-ray showed large defect within the soft tissues with diffuse soft tissue edema. IV Vanco, cefepime and metronidazole started.  Hospitalist to see.  Review of Systems: As mentioned in the history of present illness. All other systems reviewed and are negative. Past Medical History:  Diagnosis Date   COPD (chronic obstructive pulmonary disease) ([place])    Gout    Hypercholesteremia    Sepsis ([place])    Past Surgical History:  Procedure Laterality Date   ABDOMINAL HYSTERECTOMY     ROTATOR CUFF REPAIR      Social History:  reports that she has been smoking cigarettes. She has a 30.00 pack-year smoking history. She has never used smokeless tobacco. She reports that she does not drink alcohol and does not use drugs.  Allergies  Allergen Reactions   Gemfibrozil     Other reaction(s): Sick to stomach   Nitroglycerin     Other reaction(s): Cough, Irritation   Other Other (See Comments)    Grape fruit juice - unknown   Pravastatin     Other reaction(s): Dizzy   Sulfamethoxazole-Trimethoprim     Other reaction(s): N/V   Tyloxapol     Other reaction(s): Does not work   Family history of hypertension.  Prior to Admission medications   Medication Sig Start Date End Date Taking? Authorizing Provider  ALPRAZolam Duanne Moron) 1 MG tablet Take 1 mg by mouth 3 (three) times daily. 03/22/18   [provider]  atorvastatin (LIPITOR) 10 MG tablet Take 10 mg by mouth daily.    [provider]  buPROPion (WELLBUTRIN XL) 150 MG 24 hr tablet Take 150 mg by mouth every evening.     [provider]  esomeprazole (NEXIUM) 40 MG capsule Take 40 mg by mouth daily.    [provider]  feeding supplement (ENSURE ENLIVE / ENSURE PLUS) LIQD Take 237 mLs by mouth daily. 09/04/21   Mariel Aloe, MD  gabapentin (NEURONTIN) 300 MG capsule Take 300 mg by mouth 3 (three) times daily.    [provider]  hydrocortisone (CORTEF) 5 MG tablet Take 1 tablet (5 mg total) by mouth daily for 5 days. 09/05/21 09/10/21  Mariel Aloe, MD  ipratropium-albuterol (DUONEB) 0.5-2.5 (3) MG/3ML  SOLN use 1 vial by nebulization every 4 (four) hours as needed. Patient taking differently: Take 3 mLs by nebulization every 4 (four) hours as needed (wheezing). 03/06/21   Mariel Aloe, MD  levothyroxine (SYNTHROID) 150 MCG tablet Take 150 mcg by mouth daily before breakfast.    [provider]  lidocaine (XYLOCAINE) 2 % solution Use as directed 15 mLs in the mouth or throat as needed for mouth  pain. 09/04/21   Mariel Aloe, MD  liver oil-zinc oxide (DESITIN) 40 % ointment Apply topically 4 (four) times daily. Patient not taking: Reported on 08/15/2021 06/23/21   Little Ishikawa, MD  midodrine (PROAMATINE) 5 MG tablet Take 3 tablets (15 mg total) by mouth 3 (three) times daily with meals. 09/04/21 12/03/21  Mariel Aloe, MD  nutrition supplement, JUVEN, (JUVEN) PACK Take 1 packet by mouth daily. 09/04/21 12/03/21  Mariel Aloe, MD  oxyCODONE-acetaminophen (PERCOCET/ROXICET) 5-325 MG tablet Take 1 tablet by mouth in the morning, at noon, and at bedtime.    [provider]  polyethylene glycol (MIRALAX / GLYCOLAX) 17 g packet Take 17 g by mouth 2 (two) times daily. Decreased to once daily if having diarrhea. 09/04/21   Mariel Aloe, MD  SYMBICORT 160-4.5 MCG/ACT inhaler Inhale 2 puffs into the lungs 3 (three) times daily. 11/14/17   [provider]  venlafaxine XR (EFFEXOR-XR) 150 MG 24 hr capsule Take 150 mg by mouth at bedtime.    [provider]    Physical Exam: Vitals:   09/05/21 1445 09/05/21 1500 09/05/21 1515 09/05/21 1530  BP: (!) 128/54 (!) 123/51 (!) 120/51 (!) 100/48  Pulse: 80 82 80 90  Resp: 15 16 (!) 21 16  Temp:      TempSrc:      SpO2: 100% 100% 100% 100%  Weight:      Height:        Constitutional: NAD, calm, comfortable Eyes: PERRL, lids and conjunctivae normal ENMT: Mucous membranes are dry  Neck: normal, supple, no masses, no thyromegaly Respiratory: clear to auscultation bilaterally, no wheezing, no crackles. Normal respiratory effort. No accessory muscle use.  Cardiovascular: Regular rate and rhythm, no murmurs / rubs / gallops. No extremity edema. 2+ pedal pulses.  Abdomen: no tenderness, no masses palpated. No hepatosplenomegaly. Bowel sounds positive.  Musculoskeletal: no clubbing / cyanosis. No joint deformity upper and lower extremities. Good ROM, no contractures. Normal muscle tone.  Skin: See wound to posterior right  thigh see image. Also wounds to right heal, appears to be healing [place] without evidence of infection.  Neurologic: No apparent cranial nerve abnormality, moving extremities spontaneously. Psychiatric: Normal judgment and insight. Alert and oriented x 3. Normal mood.      Data Reviewed: EKG, sinus tachycardia rate 106, QTc 460.  Diffuse T wave inversions in lead I to aVL V2 through V6, similar to prior EKG but more pronounced.  CT right femur with contrast- Three soft tissue wounds in the right posterior thigh as described above. 2. Cellulitis of the right thigh and lower leg. 3. No evidence of osteomyelitis. No drainable fluid collection to suggest an abscess. 4. Severe tricompartmental osteoarthritis of the right knee with numerous loose bodies and bulky marginal osteophytes   Assessment and Plan: No notes have been filed under this hospital service. Service: Hospitalist  Septic shock most likely secondary to right thigh wounds-CT of the right femur with contrast showing cellulitis of the right thigh and lower leg, no osteomyelitis, no drainable fluid  collection to suggest abscess.  UA with small leukocytes, chest x-ray clear.  Febrile to 101.6, initial tachycardia to 119, leukocytosis of 13.8, blood pressure systolic down to [id number].  Lactic acid 1.4 > 1.9.  2 Liter bolus given, was subsequently started on peripheral Levophed drip. -Recent prolonged hospitalization for same, treated with 10 days of broad-spectrum antibiotics, seen by ID and antibiotics were discontinued, evaluated by general surgery, did not require surgical procedure or debridement. -Continue peripheral Levophed drip,, currently on 5 mcg -Continue broad-spectrum antibiotics IV Vanco cefepime and metronidazole -Follow-up blood and urine cultures -Secure chat with Dr. Constance Haw, per pics, does not appear at this time patient requires debridement. -IV morphine 2 mg as needed -2 L bolus given, continue N/s 100cc/hr x  20hrs -Wound care consult -Procalcitonin  Hyponatremia-sodium 128, baseline low to mid 130s. -Hydrate  Hypotension-persistent hypotension during recent hospitalization, she was dose Florinef, and started on midodrine and Solu-Cortef. -Resume midodrine and Solu-Cortef.  Hypothyroidism -Resume Synthroid  DNR  COPD-stable.  Anemia of chronic disease hemoglobin 8.3.  Stable  DVT Prophylaxis Heparin  Advance Care Planning: CODE STATUS DNR.  Confirmed with patient at bedside.  Consistent with most recent documentation of CODE STATUS.  Consults: General surgery  Family Communication: None at bedside  CRITICAL CARE Performed by: Bethena Roys   Total critical care time: 70 minutes  Critical care time was exclusive of separately billable procedures and treating other patients.  Critical care was necessary to treat or prevent imminent or life-threatening deterioration.  Critical care was time spent personally by me on the following activities: development of treatment plan with patient and/or surrogate as well as nursing, discussions with consultants, evaluation of patient's response to treatment, examination of patient, obtaining history from patient or surrogate, ordering and performing treatments and interventions, ordering and review of laboratory studies, ordering and review of radiographic studies, pulse oximetry and re-evaluation of patient's condition.   Severity of Illness: The appropriate patient status for this patient is INPATIENT. Inpatient status is judged to be reasonable and necessary in order to provide the required intensity of service to ensure the patient's safety. The patient's presenting symptoms, physical exam findings, and initial radiographic and laboratory data in the context of their chronic comorbidities is felt to place them at high risk for further clinical deterioration. Furthermore, it is not anticipated that the patient will be medically stable  for discharge from the hospital within 2 midnights of admission.   I certify that at the point of admission it is my clinical judgment that the patient will require inpatient hospital care spanning beyond 2 midnights from the point of admission due to high intensity of service, high risk for further deterioration and high frequency of surveillance required.   Author: Bethena Roys, MD 09/05/2021 8:38 PM  For on call review [email or web address].

## 2021-09-05 NOTE — ED Triage Notes (Signed)
Patient brought in via EMS from home. Alert and oriented. Airway patent. Patient brought in for infection to lower leg wounds with possible sepsis. Per patient discharged last night from [facility] for wound infection with sepsis. Per paramedic patient 90% on room air in which she was reported to be on oxygen at [facility] up until discharged but not sent home with any. Patient tachy with HR of 120 and blood pressure of 100/51 per paramedic. Dr Durwin Nora EDP in room assessing patient at arrival.

## 2021-09-05 NOTE — ED Notes (Signed)
Patient transported to X-ray 

## 2021-09-05 NOTE — Progress Notes (Signed)
09/05/2021 Called around 11:18 am by ED for admission.  Pt febrile with sepsis presentation.  Work up still pending. CXR not read and no UA done. Asked ED to complete work up started in ED or at least have CXR done and to call us back and we would be happy to admit.  Pt recently hospitalized and high risk for nosocomial infection and I think that getting CXR is reasonable prior to accepting for admission and a procalcitonin as it will help [place] to narrow down what we are really treating.  Recent DC notes say that ID evaluated and took her off all antibiotics for leg wound so we cannot assume that is the source of infection without ruling out other likely sources.  ED provider verbalized understanding and agreed to call us back when CXR results are back.  Maryln Manuel, MD

## 2021-09-05 NOTE — ED Provider Notes (Signed)
[facility] EMERGENCY DEPARTMENT Provider Note   CSN: [id number] Arrival date & time:        History  Chief Complaint  Patient presents with   Wound Infection    [name] is a 66 y.o. female.  HPI Patient presenting for concern of sepsis.  Medical history is notable for COPD, HTN, GERD, hypothyroidism, HLD, depression.  She was admitted to the hospital on 1/13 for extensive right leg wounds.  At that time, she was in septic shock and required vasopressor support.  She was hospitalized up until yesterday.  During her hospitalization, antibiotics (vancomycin and Zosyn) were given.  These were discontinued on 1/24.  She received 10 days of antibiotics.  She was discharged yesterday with recommendations for continued wound care.  Instructions were for wet-to-dry dressings.  During hospitalization, she had persistent hypotension.  She was started on midodrine and Florinef.  Critical care recommended map goal of greater than 55.  She was weaned off of Florinef and advised to continue midodrine.  She was discharged home with plans for home health care.  She returned home by private ambulance at around midnight last night.  Since her discharge, she has had nausea.  She denies vomiting or diarrhea.  She has worsening pain in her right leg wounds.  Because she was feeling worse, EMS was called.  EMS noted tachycardia in the range of 120s, hypotension in the range of 100/50.  She felt warm to touch but temperature was not obtained.  SPO2 is 90% on room air.  She was placed on supplemental oxygen.  She reports that she was on supplemental oxygen during hospitalization but does not have it at home.    Home Medications Prior to Admission medications   Medication Sig Start Date End Date Taking? Authorizing Provider  ALPRAZolam Duanne Moron) 1 MG tablet Take 1 mg by mouth 3 (three) times daily. 03/22/18   [provider]  atorvastatin (LIPITOR) 10 MG tablet Take 10 mg by mouth daily.    [provider]  buPROPion (WELLBUTRIN XL) 150 MG 24 hr tablet Take 150 mg by mouth every evening.     [provider]  esomeprazole (NEXIUM) 40 MG capsule Take 40 mg by mouth daily.    [provider]  feeding supplement (ENSURE ENLIVE / ENSURE PLUS) LIQD Take 237 mLs by mouth daily. 09/04/21   Mariel Aloe, MD  gabapentin (NEURONTIN) 300 MG capsule Take 300 mg by mouth 3 (three) times daily.    [provider]  hydrocortisone (CORTEF) 5 MG tablet Take 1 tablet (5 mg total) by mouth daily for 5 days. 09/05/21 09/10/21  Mariel Aloe, MD  ipratropium-albuterol (DUONEB) 0.5-2.5 (3) MG/3ML SOLN use 1 vial by nebulization every 4 (four) hours as needed. Patient taking differently: Take 3 mLs by nebulization every 4 (four) hours as needed (wheezing). 03/06/21   Mariel Aloe, MD  levothyroxine (SYNTHROID) 150 MCG tablet Take 150 mcg by mouth daily before breakfast.    [provider]  lidocaine (XYLOCAINE) 2 % solution Use as directed 15 mLs in the mouth or throat as needed for mouth pain. 09/04/21   Mariel Aloe, MD  liver oil-zinc oxide (DESITIN) 40 % ointment Apply topically 4 (four) times daily. Patient not taking: Reported on 08/15/2021 06/23/21   Little Ishikawa, MD  midodrine (PROAMATINE) 5 MG tablet Take 3 tablets (15 mg total) by mouth 3 (three) times daily with meals. 09/04/21 12/03/21  Mariel Aloe, MD  nutrition supplement, JUVEN, (JUVEN) PACK Take 1 packet by mouth daily. 09/04/21 12/03/21  Mariel Aloe, MD  oxyCODONE-acetaminophen (PERCOCET/ROXICET) 5-325 MG tablet Take 1 tablet by mouth in the morning, at noon, and at bedtime.    [provider]  polyethylene glycol (MIRALAX / GLYCOLAX) 17 g packet Take 17 g by mouth 2 (two) times daily. Decreased to once daily if having diarrhea. 09/04/21   Mariel Aloe, MD  SYMBICORT 160-4.5 MCG/ACT inhaler Inhale 2 puffs into the lungs 3 (three) times daily. 11/14/17   [provider]  venlafaxine  XR (EFFEXOR-XR) 150 MG 24 hr capsule Take 150 mg by mouth at bedtime.    [provider]      Allergies    Gemfibrozil, Nitroglycerin, Other, Pravastatin, Sulfamethoxazole-trimethoprim, and Tyloxapol    Review of Systems   Review of Systems  Constitutional:  Positive for chills, fatigue and fever.  Gastrointestinal:  Positive for nausea.  Skin:  Positive for wound.  All other systems reviewed and are negative.  Physical Exam Updated Vital Signs BP (!) 100/48    Pulse 90    Temp 98.6 F (37 C) (Oral)    Resp 16    Ht 5\' 5"  (1.651 m)    Wt 94.3 kg    SpO2 100%    BMI 34.61 kg/m  Physical Exam Vitals and nursing note reviewed.  Constitutional:      General: She is not in acute distress.    Appearance: She is well-developed. She is obese. She is ill-appearing. She is not toxic-appearing.     Interventions: Nasal cannula in place.  HENT:     Head: Normocephalic and atraumatic.     Right Ear: External ear normal.     Left Ear: External ear normal.     Nose: Nose normal.     Mouth/Throat:     Mouth: Mucous membranes are dry.     Pharynx: Oropharynx is clear.  Eyes:     General: No scleral icterus.    Extraocular Movements: Extraocular movements intact.     Conjunctiva/sclera: Conjunctivae normal.  Cardiovascular:     Rate and Rhythm: Regular rhythm. Tachycardia present.     Heart sounds: No murmur heard. Pulmonary:     Effort: Pulmonary effort is normal. No respiratory distress.     Breath sounds: Normal breath sounds. No wheezing or rales.  Chest:     Chest wall: No tenderness.  Abdominal:     Palpations: Abdomen is soft.     Tenderness: There is no abdominal tenderness.  Musculoskeletal:        General: Swelling and tenderness present.     Cervical back: Normal range of motion and neck supple.  Skin:    General: Skin is warm and dry.     Capillary Refill: Capillary refill takes less than 2 seconds.     Findings: Erythema present.  Neurological:     General:  No focal deficit present.     Mental Status: She is alert and oriented to person, place, and time.     Cranial Nerves: No cranial nerve deficit.     Sensory: No sensory deficit.     Motor: No weakness.  Psychiatric:        Mood and Affect: Mood normal.        Behavior: Behavior normal.     ED Results / Procedures / Treatments   Labs (all labs ordered are listed, but only abnormal results are displayed) Labs Reviewed  COMPREHENSIVE METABOLIC  PANEL - Abnormal; Notable for the following components:      Result Value   Sodium 128 (*)    Chloride 92 (*)    Glucose, Bld 115 (*)    Calcium 8.1 (*)    Total Protein 6.0 (*)    Albumin 2.7 (*)    All other components within normal limits  CBC WITH DIFFERENTIAL/PLATELET - Abnormal; Notable for the following components:   WBC 13.8 (*)    RBC 3.26 (*)    Hemoglobin 8.3 (*)    HCT 28.0 (*)    MCH 25.5 (*)    MCHC 29.6 (*)    RDW 21.6 (*)    Platelets 460 (*)    Neutro Abs 9.9 (*)    Abs Immature Granulocytes 0.10 (*)    All other components within normal limits  CULTURE, BLOOD (ROUTINE X 2)  CULTURE, BLOOD (ROUTINE X 2)  RESP PANEL BY RT-PCR (FLU A&B, COVID) ARPGX2  URINE CULTURE  LACTIC ACID, PLASMA  LACTIC ACID, PLASMA  PROTIME-INR  MAGNESIUM  URINALYSIS, ROUTINE W REFLEX MICROSCOPIC    EKG EKG Interpretation  Date/Time:  Saturday September 05 2021 11:22:17 EST Ventricular Rate:  106 PR Interval:  112 QRS Duration: 87 QT Interval:  346 QTC Calculation: 460 R Axis:   88 Text Interpretation: Sinus tachycardia Borderline right axis deviation Abnrm T, consider ischemia, anterolateral lds Baseline wander in lead(s) V4 Confirmed by Godfrey Pick [phone]) on 09/05/2021 12:47:29 PM  Radiology DG Chest Port 1 View  Result Date: 09/05/2021 CLINICAL DATA:  66 year old female with possible sepsis. Wound infection. EXAM: PORTABLE CHEST 1 VIEW COMPARISON:  Portable chest 08/14/2021 and earlier. FINDINGS: Portable AP semi upright view at  1127 hours. Lung volumes and mediastinal contours remain normal. Visualized tracheal air column is within normal limits. Allowing for portable technique the lungs are clear. No pneumothorax or pleural effusion. Degenerative appearing superior subluxation right glenohumeral joint. No acute osseous abnormality identified. Negative visible bowel gas. IMPRESSION: Negative portable chest. Electronically Signed   By: Genevie Ann M.D.   On: 09/05/2021 11:51   DG Femur Min 2 Views Right  Result Date: 09/05/2021 CLINICAL DATA:  Skin and soft tissue infection involving the posterior right leg. EXAM: RIGHT FEMUR 2 VIEWS COMPARISON:  08/15/21 FINDINGS: There is diffuse soft tissue edema. Large defect within the soft tissues overlying the posterior right lower extremity is identified. No underlying bony erosion. No acute fracture or dislocation. Moderate degenerative changes are noted involving the right hip. Severe tricompartment osteoarthritis is noted involving the right knee. IMPRESSION: 1. Large defect within the soft tissues overlying the posterior right lower extremity. No underlying bony erosion. 2. Diffuse soft tissue edema. 3. Severe tricompartment osteoarthritis of the right knee. Electronically Signed   By: Kerby Moors M.D.   On: 09/05/2021 14:12    Procedures Procedures    Medications Ordered in ED Medications  lactated ringers infusion ( Intravenous New Bag/Given 09/05/21 1404)  norepinephrine (LEVOPHED) 4mg  in 227mL (0.016 mg/mL) premix infusion (5 mcg/min Intravenous Rate/Dose Change 09/05/21 1417)  vancomycin (VANCOCIN) IVPB 1000 mg/200 mL premix (has no administration in time range)  ceFEPIme (MAXIPIME) 2 g in sodium chloride 0.9 % 100 mL IVPB (has no administration in time range)  lactated ringers bolus 1,000 mL (0 mLs Intravenous Stopped 09/05/21 1408)  lactated ringers bolus 1,000 mL (0 mLs Intravenous Stopped 09/05/21 1304)  ceFEPIme (MAXIPIME) 2 g in sodium chloride 0.9 % 100 mL IVPB (0 g  Intravenous Stopped 09/05/21 1148)  metroNIDAZOLE (FLAGYL) IVPB 500 mg (0 mg Intravenous Stopped 09/05/21 1222)  vancomycin (VANCOREADY) IVPB 2000 mg/400 mL (0 mg Intravenous Stopped 09/05/21 1408)  acetaminophen (TYLENOL) tablet 650 mg (650 mg Oral Given 09/05/21 1222)    ED Course/ Medical Decision Making/ A&P                           Medical Decision Making Amount and/or Complexity of Data Reviewed Labs: ordered. Radiology: ordered. ECG/medicine tests: ordered.  Risk OTC drugs. Prescription drug management.   This patient presents to the ED for concern of nausea, fatigue, worsening wound pain, this involves an extensive number of treatment options, and is a complaint that carries with it a high risk of complications and morbidity.  The differential diagnosis includes sepsis, dehydration, viral infection   Co morbidities that complicate the patient evaluation  COPD, HTN, GERD, hypothyroidism, HLD, depression   Additional history obtained:  Additional history obtained from EMS External records from outside source obtained and reviewed including EMR   Lab Tests:  I Ordered, and personally interpreted labs.  The pertinent results include: Leukocytosis, baseline normocytic anemia, normal lactate, new hyponatremia   Imaging Studies ordered:  I ordered imaging studies including chest x-ray, right femur x-ray I independently visualized and interpreted imaging which showed no acute findings on chest x-ray; expected soft tissue defect on femur x-ray without evidence of bony erosion or soft tissue free air I agree with the radiologist interpretation   Cardiac Monitoring:  The patient was maintained on a cardiac monitor.  I personally viewed and interpreted the cardiac monitored which showed an underlying rhythm of: Sinus rhythm   Medicines ordered and prescription drug management:  I ordered medication including IV fluids and broad-spectrum antibiotics for treatment of  sepsis Reevaluation of the patient after these medicines showed that the patient improved I have reviewed the patients home medicines and have made adjustments as needed  Critical Interventions:  Recognition of sepsis, IV fluids and broad-spectrum antibiotics, admission to hospital  Problem List / ED Course:  66 year old female presenting from home for worsening pain in the areas of her RLE wounds, nausea, fatigue.  She had a 3-week hospitalization for her right leg wounds.  She was discharged yesterday and returned home at around midnight last night.  Since she returned home, she has had worsening pain and nausea.  EMS was called this morning and patient was found to be tachycardic and tachypneic.  Blood pressures were soft, which was present and treated with midodrine while inpatient.  Rectal temperature showed a 101.6 degree fever.  Patient meets sepsis criteria.  IV fluids and broad-spectrum antibiotics were ordered.  Tylenol was ordered for antipyresis.  I suspect that the source of infection is her chronic leg wound.  Patient will require hospitalization.  I spoke with hospitalist for admission who states that he will need to see a urinalysis result prior to admission.  Although this would not change management, patient to remain in the ED waiting urine studies.  While in the ED, patient had continued soft blood pressures during initial IVF infusion.  Heart rate improved.  Following IVF, patient was hypotensive.  Levophed was started and maintained at 5 mcg/min.  Patient did report improved symptoms.   Reevaluation:  After the interventions noted above, I reevaluated the patient and found that they have :improved   Social Determinants of Health:  Recent hospitalization, poor mobility, will likely need skilled nursing care   Dispostion:  After consideration of the diagnostic results and the patients response to treatment, I feel that the patent would benefit from admission to hospital.    CRITICAL CARE Performed by: Godfrey Pick   Total critical care time: 35 minutes  Critical care time was exclusive of separately billable procedures and treating other patients.  Critical care was necessary to treat or prevent imminent or life-threatening deterioration.  Critical care was time spent personally by me on the following activities: development of treatment plan with patient and/or surrogate as well as nursing, discussions with consultants, evaluation of patient's response to treatment, examination of patient, obtaining history from patient or surrogate, ordering and performing treatments and interventions, ordering and review of laboratory studies, ordering and review of radiographic studies, pulse oximetry and re-evaluation of patient's condition.         Final Clinical Impression(s) / ED Diagnoses Final diagnoses:  Hyponatremia  Wound infection  Sepsis, due to unspecified organism, unspecified whether acute organ dysfunction present [facility])  Septic shock [facility])    Rx / DC Orders ED Discharge Orders     None         Godfrey Pick, MD 09/05/21 [phone]

## 2021-09-06 ENCOUNTER — Encounter (HOSPITAL_COMMUNITY): Payer: Self-pay | Admitting: Internal Medicine

## 2021-09-06 DIAGNOSIS — B888 Other specified infestations: Secondary | ICD-10-CM | POA: Diagnosis present

## 2021-09-06 DIAGNOSIS — E871 Hypo-osmolality and hyponatremia: Secondary | ICD-10-CM | POA: Diagnosis present

## 2021-09-06 DIAGNOSIS — D638 Anemia in other chronic diseases classified elsewhere: Secondary | ICD-10-CM | POA: Diagnosis not present

## 2021-09-06 DIAGNOSIS — J449 Chronic obstructive pulmonary disease, unspecified: Secondary | ICD-10-CM | POA: Diagnosis present

## 2021-09-06 DIAGNOSIS — I9589 Other hypotension: Secondary | ICD-10-CM

## 2021-09-06 DIAGNOSIS — D72829 Elevated white blood cell count, unspecified: Secondary | ICD-10-CM | POA: Diagnosis present

## 2021-09-06 DIAGNOSIS — I10 Essential (primary) hypertension: Secondary | ICD-10-CM

## 2021-09-06 DIAGNOSIS — S81801S Unspecified open wound, right lower leg, sequela: Secondary | ICD-10-CM

## 2021-09-06 DIAGNOSIS — A419 Sepsis, unspecified organism: Secondary | ICD-10-CM | POA: Diagnosis not present

## 2021-09-06 DIAGNOSIS — K08109 Complete loss of teeth, unspecified cause, unspecified class: Secondary | ICD-10-CM

## 2021-09-06 DIAGNOSIS — Z66 Do not resuscitate: Secondary | ICD-10-CM

## 2021-09-06 DIAGNOSIS — R627 Adult failure to thrive: Secondary | ICD-10-CM

## 2021-09-06 DIAGNOSIS — Z91199 Patient's noncompliance with other medical treatment and regimen due to unspecified reason: Secondary | ICD-10-CM

## 2021-09-06 DIAGNOSIS — L03115 Cellulitis of right lower limb: Secondary | ICD-10-CM | POA: Diagnosis not present

## 2021-09-06 DIAGNOSIS — E876 Hypokalemia: Secondary | ICD-10-CM | POA: Diagnosis present

## 2021-09-06 DIAGNOSIS — F1120 Opioid dependence, uncomplicated: Secondary | ICD-10-CM | POA: Diagnosis present

## 2021-09-06 LAB — CBC
HCT: 28.1 % — ABNORMAL LOW (ref 36.0–46.0)
Hemoglobin: 8.2 g/dL — ABNORMAL LOW (ref 12.0–15.0)
MCH: 25.4 pg — ABNORMAL LOW (ref 26.0–34.0)
MCHC: 29.2 g/dL — ABNORMAL LOW (ref 30.0–36.0)
MCV: 87 fL (ref 80.0–100.0)
Platelets: 430 10*3/uL — ABNORMAL HIGH (ref 150–400)
RBC: 3.23 MIL/uL — ABNORMAL LOW (ref 3.87–5.11)
RDW: 21.2 % — ABNORMAL HIGH (ref 11.5–15.5)
WBC: 9.3 10*3/uL (ref 4.0–10.5)
nRBC: 0.2 % (ref 0.0–0.2)

## 2021-09-06 LAB — BASIC METABOLIC PANEL
Anion gap: 8 (ref 5–15)
BUN: 9 mg/dL (ref 8–23)
CO2: 28 mmol/L (ref 22–32)
Calcium: 8.1 mg/dL — ABNORMAL LOW (ref 8.9–10.3)
Chloride: 101 mmol/L (ref 98–111)
Creatinine, Ser: 0.44 mg/dL (ref 0.44–1.00)
GFR, Estimated: >60 mL/min (ref >60)
Glucose, Bld: 169 mg/dL — ABNORMAL HIGH (ref 70–99)
Potassium: 3.4 mmol/L — ABNORMAL LOW (ref 3.5–5.1)
Sodium: 137 mmol/L (ref 135–145)

## 2021-09-06 LAB — PROCALCITONIN: Procalcitonin: 0.12 ng/mL

## 2021-09-06 MED ORDER — ZINC OXIDE 40 % EX OINT
TOPICAL_OINTMENT | Freq: Four times a day (QID) | CUTANEOUS | Status: DC
  Administered 2021-09-09 – 2021-09-13 (×2): 1 via TOPICAL
  Filled 2021-09-06 (×2): qty 57

## 2021-09-06 MED ORDER — MORPHINE SULFATE (PF) 2 MG/ML IV SOLN
1.0000 mg | Freq: Once | INTRAVENOUS | Status: DC
Start: 2021-09-07 — End: 2021-09-07

## 2021-09-06 MED ORDER — MOMETASONE FURO-FORMOTEROL FUM 200-5 MCG/ACT IN AERO
2.0000 | INHALATION_SPRAY | Freq: Two times a day (BID) | RESPIRATORY_TRACT | Status: DC
  Administered 2021-09-06 – 2021-09-15 (×16): 2 via RESPIRATORY_TRACT
  Filled 2021-09-06 (×2): qty 8.8

## 2021-09-06 MED ORDER — MAGIC MOUTHWASH W/LIDOCAINE
5.0000 mL | Freq: Three times a day (TID) | ORAL | Status: DC | PRN
  Administered 2021-09-06 – 2021-09-13 (×8): 5 mL via ORAL
  Filled 2021-09-06 (×10): qty 5

## 2021-09-06 MED ORDER — VENLAFAXINE HCL ER 150 MG PO CP24
150.0000 mg | ORAL_CAPSULE | Freq: Every day | ORAL | Status: DC
  Administered 2021-09-06 – 2021-09-14 (×9): 150 mg via ORAL
  Filled 2021-09-06: qty 1
  Filled 2021-09-06: qty 2
  Filled 2021-09-06 (×7): qty 1

## 2021-09-06 MED ORDER — POTASSIUM CHLORIDE CRYS ER 20 MEQ PO TBCR
40.0000 meq | EXTENDED_RELEASE_TABLET | Freq: Once | ORAL | Status: AC
  Administered 2021-09-06: 40 meq via ORAL
  Filled 2021-09-06: qty 2

## 2021-09-06 MED ORDER — ALPRAZOLAM 0.5 MG PO TABS
1.0000 mg | ORAL_TABLET | Freq: Two times a day (BID) | ORAL | Status: DC | PRN
  Administered 2021-09-06: 1 mg via ORAL
  Filled 2021-09-06: qty 2

## 2021-09-06 MED ORDER — GABAPENTIN 300 MG PO CAPS
300.0000 mg | ORAL_CAPSULE | Freq: Every day | ORAL | Status: DC
  Administered 2021-09-06 – 2021-09-14 (×9): 300 mg via ORAL
  Filled 2021-09-06 (×9): qty 1

## 2021-09-06 MED ORDER — POLYETHYLENE GLYCOL 3350 17 G PO PACK
17.0000 g | PACK | Freq: Two times a day (BID) | ORAL | Status: DC
  Administered 2021-09-06 – 2021-09-15 (×5): 17 g via ORAL
  Filled 2021-09-06 (×15): qty 1

## 2021-09-06 MED ORDER — HYDROCORTISONE SOD SUC (PF) 100 MG IJ SOLR
100.0000 mg | Freq: Once | INTRAMUSCULAR | Status: AC
  Administered 2021-09-06: 100 mg via INTRAVENOUS
  Filled 2021-09-06: qty 2

## 2021-09-06 MED ORDER — HYDROCORTISONE SOD SUC (PF) 100 MG IJ SOLR: 75.0000 mg | Freq: Two times a day (BID) | INTRAMUSCULAR | Status: DC

## 2021-09-06 MED ORDER — BUPROPION HCL ER (XL) 150 MG PO TB24
150.0000 mg | ORAL_TABLET | Freq: Every evening | ORAL | Status: DC
  Administered 2021-09-06 – 2021-09-15 (×9): 150 mg via ORAL
  Filled 2021-09-06 (×9): qty 1

## 2021-09-06 MED ORDER — CHLORHEXIDINE GLUCONATE CLOTH 2 % EX PADS
6.0000 | MEDICATED_PAD | Freq: Every day | CUTANEOUS | Status: DC
  Administered 2021-09-06 – 2021-09-09 (×4): 6 via TOPICAL

## 2021-09-06 MED ORDER — DAKINS (1/4 STRENGTH) 0.125 % EX SOLN
Freq: Two times a day (BID) | CUTANEOUS | Status: AC
  Filled 2021-09-06 (×2): qty 473

## 2021-09-06 MED ORDER — ALPRAZOLAM 0.5 MG PO TABS
0.5000 mg | ORAL_TABLET | Freq: Once | ORAL | Status: AC
  Administered 2021-09-06: 0.5 mg via ORAL
  Filled 2021-09-06: qty 1

## 2021-09-06 MED ORDER — ALPRAZOLAM 1 MG PO TABS
1.0000 mg | ORAL_TABLET | Freq: Three times a day (TID) | ORAL | Status: DC | PRN
  Administered 2021-09-06 – 2021-09-15 (×21): 1 mg via ORAL
  Filled 2021-09-06 (×16): qty 1
  Filled 2021-09-06: qty 2
  Filled 2021-09-06 (×4): qty 1

## 2021-09-06 MED ORDER — SODIUM CHLORIDE 0.9 % IV SOLN: INTRAVENOUS | Status: AC

## 2021-09-06 NOTE — Assessment & Plan Note (Addendum)
Received morphine especially during dressing changes.  Continue oral  Percocet

## 2021-09-06 NOTE — Progress Notes (Signed)
PROGRESS NOTE   [name]  [id number] DOB: [birth year] DOA: 09/05/2021 PCP: Crist Infante, MD   Chief Complaint  Patient presents with   Wound Infection   Level of care: ICU  Brief Admission History:  [name] is a 66 y.o. female with medical history significant for COPD, depression, hypertension, hypothyroidism, who presented to the ED with complaints of feeling ill.    Recent prolonged hospitalization 1/13-2/3, discharged yesterday from [facility] managed for septic shock secondary to right lower extremity cellulitis. CT During hospitalization showed marked severity cellulitis, without evidence of fluid collection or abscess.   She was treated with broad-spectrum antibiotics with IV Vanco,she completed 10 days.  ID was consulted and recommended discontinuing antibiotics. General surgery was consulted, patient did not need any surgical intervention or debridement. She also had hypotension which was persistent for which she was weaned off Florinef and placed on midodrine.   Patient was discharged home yesterday, came back today reporting worsening pain in her right leg, with nausea.  Pt apparently had refused SNF placement.  She apparently went to see a friend in [facility] and became ill and called EMS to transport to ED.     This is patient's third hospitalization for same.   ED course-temperature 101.6.  Heart rate 78-119.  Respiratory 16-24.  Blood pressure down to 80/38, improved to [id number] systolic after 2L fluid boluses and IV norepinephrine started.  Femur x-ray showed large defect within the soft tissues with diffuse soft tissue edema.  IV Vanco, cefepime and metronidazole started. Hospitalist was consulted.   Assessment and Plan: * Septic shock ([place])- (present on admission) -presumably secondary to right lower extremity cellulitis, no other source of infection has been found -She had been taken off antibiotics after completing a 10-day course per ID seen  at [facility] long -She is empirically started on broad-spectrum antibiotics.  -She remains on IV norepinephrine which is being weaned off.   -Requested an infectious disease consultation.    Chronic hypotension- (present on admission) -Pt has been on midodrine 15 mg TID -currently weaning off norepinephrine -solucortef discontinued she had normal cortisol level -holding antihypertensives   Cellulitis of right lower extremity- (present on admission) - Continues to be soft tissue infection after CT completed -No signs of abscess or deep tissue infection -Surgery reviewed photo sent reports it does not appear to need surgical debridement at this time -Patient also was seen by surgery at [facility] long and had same conclusion and recommendation -Open wounds currently being treated as ordered by the wound care nursing instructions -Patient clearly cannot manage this severe wound infection on her own at home with home health services -She will need SNF placement -She seems to be agreeable at this time -She is demanding to be transferred back to [facility] or [facility] because as where her family is located -We will try to reach out to patient placement to see if a bed is available at one of the campuses in [place] per patient request -ID consultation requested for recommendations on antibiotic and length of therapy.    Open leg wound, right, sequela - Continue detailed wound care instructions as ordered by the wound care nursing team -They are very familiar with patient and have been working with her over the past several weeks while she was hospitalized at [facility] -Discussed with surgery no need for surgical debridement at this time -Continue air mattress overlay   Hypokalemia- (present on admission) -Oral replacement ordered, recheck  in a.m. -Check magnesium in a.m.  Anxiety- (present on admission) - Patient reports severe anxiety and we have resumed her alprazolam 1  mg 3 times daily as needed  Anemia, chronic disease- (present on admission) - Monitoring hemoglobin closely  Opioid dependence ([place])- (present on admission) -pain management as ordered, especially with dressing changes.  COPD (chronic obstructive pulmonary disease) ([place])- (present on admission) -bronchodilators as ordered  Home Infestation by bed bug- (present on admission) - Clearly an unsafe discharge to home and would ask for a [facility] consultation as I think that she clearly requires skilled placement.  Edentulous- (present on admission) - Soft foods diet recommended  Leukocytosis- (present on admission) - improved with antibiotic treatment   Hyponatremia- (present on admission) - Improved with IV fluid hydration.   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 that has been very difficult to manage  Pressure injury of skin- (present on admission) - Continue air mattress overlay at all times  DNR (do not resuscitate)- (present on admission) -confirmed and will continue DNR order while in hospital   Adult failure to thrive- (present on admission) - poor social situation in setting of chronic open right leg wound, difficult to heal, recurrent skin infections and prolonged hospitalizations  HLD (hyperlipidemia)- (present on admission) - Resume home atorvastatin  Hypothyroidism- (present on admission) - Resume home levothyroxine  Depression- (present on admission) - Resume home Effexor and Wellbutrin  Sleep apnea- (present on admission) - We will offer nightly CPAP  GERD (gastroesophageal reflux disease)- (present on admission) -protonix ordered for GI protection.   Essential hypertension- (present on admission) -HOLDING ALL  ANTIHYPERTENSIVES IN SETTING OF HYPOTENSION   DVT prophylaxis: sq heparin Code Status: DNR  Family Communication: patient  Disposition: pt demanding transfer to a [facility], discussed with [facility], await for bed to be available Status is: Inpatient Remains inpatient appropriate because: IV antibiotics, IV fluids   Consultants:  ID Discussed case with surgery at [facility] (Dr. Constance Haw)  Procedures:    Antimicrobials:  Cefepime 2/4>> Metronidazole 2/4>> Vancomycin 2/4>>  Subjective: PT VERY UPSET THAT SHE HAS NOT BEEN TRANSFERRED TO [place].  SHE SAID SHE REQUESTED TRANSFER WHEN SHE WAS IN THE EMERGENCY ROOM.   Objective: Vitals:   09/06/21 1345 09/06/21 1400 09/06/21 1415 09/06/21 1430  BP: (!) 137/45 (!) 139/44 (!) 131/48 (!) 158/59  Pulse: 64 (!) 57 (!) 57 (!) 55  Resp: 16 14 13 18   Temp:      TempSrc:      SpO2: 100% 100% 100% 100%  Weight:      Height:        Intake/Output Summary (Last 24 hours) at 09/06/2021 1438 Last data filed at 09/06/2021 1433 Gross per 24 hour  Intake 2018.96 ml  Output 3000 ml  Net -981.04 ml   Filed Weights   09/05/21 1038 09/05/21 1857  Weight: 94.3 kg 93.4 kg   Examination:  General exam: Appears calm and comfortable  Respiratory system: Clear to auscultation. Respiratory effort normal. Cardiovascular system: normal S1 & S2 heard. No JVD, murmurs, rubs, gallops or clicks. No pedal edema. Gastrointestinal system: Abdomen is nondistended, soft and nontender. No organomegaly or masses felt. Normal bowel sounds heard. Central nervous system: Alert and oriented. No focal neurological deficits. Extremities: Symmetric  5 x 5 power. Skin: right leg open wound  Psychiatry: Judgement and insight appear poor. Mood & affect anxious and intermittently agitated.   Data Reviewed: I have personally reviewed following labs and imaging studies  CBC: Recent Labs  Lab 09/01/21 0450 09/02/21 0403 09/04/21 0635 09/05/21 1041 09/06/21 0554   WBC 8.3 7.0 9.3 13.8* 9.3  NEUTROABS  --   --   --  9.9*  --   HGB 8.3* 7.3* 7.7* 8.3* 8.2*  HCT 28.5* 25.3* 26.3* 28.0* 28.1*  MCV 87.4 88.5 86.2 85.9 87.0  PLT 538* 495* 460* 460* 430*    Basic Metabolic Panel: Recent Labs  Lab 08/31/21 0456 09/01/21 0450 09/02/21 0403 09/04/21 0635 09/05/21 1041 09/06/21 0554  NA 133* 135 138  --  128* 137  K 3.8 3.3* 3.8 4.0 4.0 3.4*  CL 91* 92* 99  --  92* 101  CO2 35* 36* 34*  --  27 28  GLUCOSE 93 108* 105*  --  115* 169*  BUN 27* 24* 24*  --  13 9  CREATININE 0.67 0.65 0.42*  --  0.58 0.44  CALCIUM 8.2* 8.5* 8.4*  --  8.1* 8.1*  MG  --   --   --  2.1 2.0  --     CBG: Recent Labs  Lab 08/30/21 1549 08/30/21 2123 08/30/21 2132 08/31/21 0749 08/31/21 1239  GLUCAP 108* 91 103* 73   58* 137*    Recent Results (from the past 240 hour(s))  Blood Culture (routine x 2)     Status: None (Preliminary result)   Collection Time: 09/05/21 10:41 AM   Specimen: Right Antecubital; Blood  Result Value Ref Range Status   Specimen Description   Final    RIGHT ANTECUBITAL BOTTLES DRAWN AEROBIC AND ANAEROBIC   Special Requests Blood Culture adequate volume  Final   Culture   Final    NO GROWTH < 24 HOURS Performed at [facility], [address], [place], [place] [address]    Report Status PENDING  Incomplete  Resp Panel by RT-PCR (Flu A&B, Covid) Nasopharyngeal Swab     Status: None   Collection Time: 09/05/21 10:42 AM   Specimen: Nasopharyngeal Swab; Nasopharyngeal(NP) swabs in vial transport medium  Result Value Ref Range Status   SARS Coronavirus 2 by RT PCR NEGATIVE NEGATIVE Final    Comment: (NOTE) SARS-CoV-2 target nucleic acids are NOT DETECTED.  The SARS-CoV-2 RNA is generally detectable in upper respiratory specimens during the acute phase of infection. The lowest concentration of SARS-CoV-2 viral copies this assay can detect is 138 copies/mL. A negative result does not preclude SARS-Cov-2 infection and should not be used  as the sole basis for treatment or other patient management decisions. A negative result may occur with  improper specimen collection/handling, submission of specimen other than nasopharyngeal swab, presence of viral mutation(s) within the areas targeted by this assay, and inadequate number of viral copies(<138 copies/mL). A negative result must be combined with clinical observations, patient history, and epidemiological information. The expected result is Negative.  Fact Sheet for Patients:  EntrepreneurPulse.com.au  Fact Sheet for Healthcare Providers:  IncredibleEmployment.be  This test is no t yet approved or cleared by the [place] FDA and  has been authorized for detection and/or diagnosis of SARS-CoV-2 by FDA under an Emergency Use Authorization (EUA). This EUA will remain  in effect (meaning this test can be used) for the duration of the COVID-19 declaration under Section 564(b)(1) of the Act, 21 U.S.C.section  360bbb-3(b)(1), unless the authorization is terminated  or revoked sooner.       Influenza A by PCR NEGATIVE NEGATIVE Final   Influenza B by PCR NEGATIVE NEGATIVE Final    Comment: (NOTE) The Xpert Xpress SARS-CoV-2/FLU/RSV plus assay is intended as an aid in the diagnosis of influenza from Nasopharyngeal swab specimens and should not be used as a sole basis for treatment. Nasal washings and aspirates are unacceptable for Xpert Xpress SARS-CoV-2/FLU/RSV testing.  Fact Sheet for Patients: EntrepreneurPulse.com.au  Fact Sheet for Healthcare Providers: IncredibleEmployment.be  This test is not yet approved or cleared by the [place] FDA and has been authorized for detection and/or diagnosis of SARS-CoV-2 by FDA under an Emergency Use Authorization (EUA). This EUA will remain in effect (meaning this test can be used) for the duration of the COVID-19 declaration under Section 564(b)(1)  of the Act, 21 U.S.C. section 360bbb-3(b)(1), unless the authorization is terminated or revoked.  Performed at [facility], [address], [place], [place] [address]   Blood Culture (routine x 2)     Status: None (Preliminary result)   Collection Time: 09/05/21 11:11 AM   Specimen: Left Antecubital; Blood  Result Value Ref Range Status   Specimen Description   Final    LEFT ANTECUBITAL BOTTLES DRAWN AEROBIC AND ANAEROBIC   Special Requests Blood Culture adequate volume  Final   Culture   Final    NO GROWTH < 24 HOURS Performed at [facility], [address], [place],  [address]    Report Status PENDING  Incomplete     Radiology Studies: CT FEMUR RIGHT W CONTRAST  Result Date: 09/05/2021 CLINICAL DATA:  Right posterior leg wound. EXAM: CT OF THE LOWER RIGHT EXTREMITY WITH CONTRAST TECHNIQUE: Multidetector CT imaging of the lower right extremity was performed according to the standard protocol following intravenous contrast administration. RADIATION DOSE REDUCTION: This exam was performed according to the departmental dose-optimization program which includes automated exposure control, adjustment of the mA and/or kV according to patient size and/or use of iterative reconstruction technique. CONTRAST:  136mL OMNIPAQUE IOHEXOL 300 MG/ML  SOLN COMPARISON:  08/15/2021 FINDINGS: Bones/Joint/Cartilage No acute fracture or dislocation. Mild osteoarthritis of the right SI joint. Mild osteoarthritis of the right hip. Severe tricompartmental osteoarthritis of the right knee with numerous loose bodies and bulky marginal osteophytes. Small right knee joint effusion. Normal alignment. No periosteal reaction or bone destruction. Ligaments Ligaments are suboptimally evaluated by CT. Muscles, Tendons and Soft tissue 4.1 x 3.4 x 3 cm soft tissue wound along the posterior upper thigh extending to the lateral margin of the flexor compartment muscles. Second slightly inferiorly located 4 x 3.1 x 3.6 cm soft  tissue wound involving the posterior upper thigh with the wound extending superficial to the inferior peripheral aspect of the gluteus maximus muscle. Posteromedially in the upper thigh there is a 2.7 x 3.6 x 4.7 cm soft tissue wound. Soft tissue edema and skin thickening of the subcutaneous fat along the thigh consistent with cellulitis extending into the lower leg. No focal fluid collection to suggest an abscess. IMPRESSION: 1. Three soft tissue wounds in the right posterior thigh as described above. 2. Cellulitis of the right thigh and lower leg. 3. No evidence of osteomyelitis. No drainable fluid collection to suggest an abscess. 4. Severe tricompartmental osteoarthritis of the right knee with numerous loose bodies and bulky marginal osteophytes. Electronically Signed   By: Kathreen Devoid M.D.   On: 09/05/2021 17:39   DG Chest Port 1  View  Result Date: 09/05/2021 CLINICAL DATA:  66 year old female with possible sepsis. Wound infection. EXAM: PORTABLE CHEST 1 VIEW COMPARISON:  Portable chest 08/14/2021 and earlier. FINDINGS: Portable AP semi upright view at 1127 hours. Lung volumes and mediastinal contours remain normal. Visualized tracheal air column is within normal limits. Allowing for portable technique the lungs are clear. No pneumothorax or pleural effusion. Degenerative appearing superior subluxation right glenohumeral joint. No acute osseous abnormality identified. Negative visible bowel gas. IMPRESSION: Negative portable chest. Electronically Signed   By: Genevie Ann M.D.   On: 09/05/2021 11:51   DG Femur Min 2 Views Right  Result Date: 09/05/2021 CLINICAL DATA:  Skin and soft tissue infection involving the posterior right leg. EXAM: RIGHT FEMUR 2 VIEWS COMPARISON:  08/15/21 FINDINGS: There is diffuse soft tissue edema. Large defect within the soft tissues overlying the posterior right lower extremity is identified. No underlying bony erosion. No acute fracture or dislocation. Moderate degenerative  changes are noted involving the right hip. Severe tricompartment osteoarthritis is noted involving the right knee. IMPRESSION: 1. Large defect within the soft tissues overlying the posterior right lower extremity. No underlying bony erosion. 2. Diffuse soft tissue edema. 3. Severe tricompartment osteoarthritis of the right knee. Electronically Signed   By: Kerby Moors M.D.   On: 09/05/2021 14:12    Scheduled Meds:  atorvastatin  10 mg Oral Daily   buPROPion  150 mg Oral QPM   Chlorhexidine Gluconate Cloth  6 each Topical Daily   feeding supplement  237 mL Oral Q24H   gabapentin  300 mg Oral QHS   heparin  5,000 Units Subcutaneous Q8H   levothyroxine  150 mcg Oral QAC breakfast   liver oil-zinc oxide   Topical QID   midodrine  15 mg Oral TID WC   mometasone-formoterol  2 puff Inhalation BID   polyethylene glycol  17 g Oral BID   sodium hypochlorite   Topical BID   venlafaxine XR  150 mg Oral QHS   Continuous Infusions:  sodium chloride     sodium chloride 75 mL/hr at 09/06/21 1433   ceFEPime (MAXIPIME) IV Stopped (09/06/21 1225)   metronidazole Stopped (09/06/21 1108)   norepinephrine (LEVOPHED) Adult infusion Stopped (09/06/21 1432)   vancomycin Stopped (09/06/21 1416)     LOS: 1 day   Critical Care Procedure Note Authorized and Performed by: Murvin Natal MD  Total Critical Care time:  45 mins Due to a high probability of clinically significant, life threatening deterioration, the patient required my highest level of preparedness to intervene emergently and I personally spent this critical care time directly and personally managing the patient.  This critical care time included obtaining a history; examining the patient, pulse oximetry; ordering and review of studies; arranging urgent treatment with development of a management plan; evaluation of patient's response of treatment; frequent reassessment; and discussions with other providers.  This critical care time was performed to  assess and manage the high probability of imminent and life threatening deterioration that could result in multi-organ failure.  It was exclusive of separately billable procedures and treating other patients and teaching time.    Irwin Brakeman, MD How to contact the [facility] Attending or Consulting provider [place] or covering provider during after hours [place], for this patient?  Check the care team in [facility] and look for a) attending/consulting TR[facility] provider listed and b) the [facility] team listed Log into [email or web address] and use [place]'s universal password to access. If you do not  have the password, please contact the hospital operator. Locate the [facility] provider you are looking for under [facility] and page to a number that you can be directly reached. If you still have difficulty reaching the provider, please page the [facility] (Director on Call) for the Hospitalists listed on amion for assistance.  09/06/2021, 2:38 PM

## 2021-09-06 NOTE — Assessment & Plan Note (Addendum)
Continue Effexor and Wellbutrin.

## 2021-09-06 NOTE — Assessment & Plan Note (Addendum)
Recommend soft diet.  Was on dysphagia 3 diet.

## 2021-09-06 NOTE — Assessment & Plan Note (Addendum)
Mild.  Latest sodium of 131.  Received IV hydration.

## 2021-09-06 NOTE — Assessment & Plan Note (Signed)
-  confirmed and will continue DNR order while in hospital

## 2021-09-06 NOTE — Hospital Course (Addendum)
[name] is a 66 y.o. female with medical history significant for COPD, depression, hypertension, hypothyroidism, who presented to the emergency department with feeling of not feeling well.  Of note patient was recently discharged from [facility] hospital after being treated for septic shock secondary to right lower extremity cellulitis and had completed 10-day course of antibiotic.  General surgery did not recommend any surgical intervention.  Patient also required midodrine and was taken off Florinef.  After discharge from the hospital, patient continued to report worsening pain in the leg.  This is her third hospitalization for the same problem.  On previous admission, patient had refused skilled nursing facility placement.  In the ED, temperature was 101.6 F, was tachycardic and tachypneic.  Blood pressure was low which improved with 2 L of IV fluid bolus and patient was given IV Levophed.  Femur x-ray showed large defect in the soft tissue with soft tissue edema.  Patient was started on IV vancomycin cefepime metronidazole and was admitted to hospital for further evaluation and treatment.  Infectious disease was consulted during hospitalization for antibiotic recommendation and was subsequently switched to oral antibiotic which the patient has completed.  At this time, patient is awaiting for disposition to skilled nursing facility and is medically stable for disposition.

## 2021-09-06 NOTE — Assessment & Plan Note (Addendum)
Improved after replacement.  Latest potassium of 3.8

## 2021-09-06 NOTE — Assessment & Plan Note (Addendum)
On midodrine.  Antihypertensives on hold

## 2021-09-06 NOTE — Assessment & Plan Note (Addendum)
Continue Synthroid °

## 2021-09-06 NOTE — Assessment & Plan Note (Addendum)
Continue Xanax for now.

## 2021-09-06 NOTE — Progress Notes (Signed)
Report given.  Patient has several wounds that were changed on previous shift.  Night shift unable to to get her ready for transport.  Wound care items being sent with her.  Suggested Size wise bed being ordered and potential need for foley instead of pure wick due to wounds.

## 2021-09-06 NOTE — Assessment & Plan Note (Addendum)
Third admission for the same.  Unable to care for self at home.   Awaiting skilled nursing facility placement

## 2021-09-06 NOTE — Assessment & Plan Note (Addendum)
TOC on board for safe disposition.

## 2021-09-06 NOTE — Progress Notes (Signed)
Patient refusing CPAP. States that she wears O2 if needed at [facility] at home.

## 2021-09-06 NOTE — Assessment & Plan Note (Addendum)
Continue air mattress overlay at all times

## 2021-09-06 NOTE — Assessment & Plan Note (Addendum)
Multifactorial.  Encourage oral nutrition.

## 2021-09-06 NOTE — Assessment & Plan Note (Addendum)
Seen by infectious disease during hospitalization.  Completed course of antibiotic with doxycycline and Augmentin until 09/14/2021.   Afebrile at this time.  No leukocytosis.  Blood cultures negative.

## 2021-09-06 NOTE — Progress Notes (Signed)
Upon entering room to help attending RN with a task, patient was found to be tearful and expressing that she wishes to go back to [facility] as that's where she was just discharged from so she could have a familiarity of care and be closer to her family so they could visit her. MD and [facility] made aware.

## 2021-09-06 NOTE — Assessment & Plan Note (Addendum)
-  Continue nightly CPAP °

## 2021-09-06 NOTE — Assessment & Plan Note (Addendum)
Patient was initiated on midodrine on last admission.  Continue midodrine.

## 2021-09-06 NOTE — Assessment & Plan Note (Addendum)
Continue atorvastatin

## 2021-09-06 NOTE — Consult Note (Signed)
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 Patient seen by our team plus the surgery team and ID team during last admission. Discharged to home on 09/04/21, patient presented in ED less than 12 hours later on 09/05/21. Wound type:Moisture, pressure Pressure Injury POA: Yes Measurement: Buttocks:  5cm x 5cm x 0.1cm denuded areas on bilateral buttocks (partial thickness). Scant serous exudate. Red, moist. Right heel: Red, moist, scant, but purulent exudate. Wound measures 12 cm x 7cm x 0.2cm. Right posterior thigh: 21cm x 14cm x 4cm red, moist with irregular wound bed.  Photos of right posterior thigh wound taken and uploaded to EMR.  Wound bed:As noted above Drainage (amount, consistency, odor) As noted above Periwound:As noted above Dressing procedure/placement/frequency: As dressings in ED had a green tinge, I will return to the 3 days (6 treatments) of using sodium hypochlorite solution to both clean and dress wounds before returning to NS for dressing changes and cleansing. The patient's buttocks are with moisture associated skin damage and irritant contact dermatitis related to incontinence. Her urinary incontinence was managed during the last admission by an external device for UI (PurWick) and this is not available at home. I will add topical application of Desitin cream to there buttocks 4 times daily and PRN soiling. A PurWick is in place. Patient was also readmitted without her pressure redistribution heel boots. I will provide boots for this admission. Patient is on a mattress replacement with low air loss feature while in ICU.  I have added that one be ordered upon transfer to floor to continue this therapy, not only for management of microclimate, but for pressure redistribution. It will be important to reengage [facility] for  consideration of a higher level of care than she can receive at home with the intermittent services of an [facility] See PT recommendations for short term rehab from last admission that patient refused.  WOC nursing team will not follow, but will remain available to this patient, the nursing and medical teams.  Please re-consult if needed. Thanks, Ladona Mow, MSN, RN, GNP, [place]  Pager# [phone]

## 2021-09-06 NOTE — Assessment & Plan Note (Addendum)
Continue bronchodilators, oxygen.    Appears compensated at this time

## 2021-09-06 NOTE — Assessment & Plan Note (Addendum)
Resolved. Blood cultures and urine cultures been negative.  Completed IV followed by oral antibiotic

## 2021-09-06 NOTE — Progress Notes (Addendum)
09/06/2021 3:26 PM  Pt continues to cry and scream out that she wants to be transferred back to [facility].  She says she has no family in this area that can come and see her.  Pt was just discharged from [facility] on 2/3.   The [facility] was notified and he called and checked to see if they have bed available at [facility].  At this time they have a bed for patient at [facility] and was told patient has a right to transfer if a bed was available.  Transfer orders placed and patient placement notified and care team at [facility] notified.   Maryln Manuel MD

## 2021-09-06 NOTE — Assessment & Plan Note (Addendum)
Continue Protonix and sucralfate

## 2021-09-06 NOTE — Assessment & Plan Note (Addendum)
Latest hemoglobin of 9.5 from 8.8< 8.9< 8.2 <6.3.  Received 1 unit of packed RBC during hospitalization

## 2021-09-06 NOTE — Progress Notes (Signed)
Pt refused cpap

## 2021-09-06 NOTE — Assessment & Plan Note (Addendum)
Resolved at this time.  °

## 2021-09-06 NOTE — Assessment & Plan Note (Addendum)
No need for surgical debridement as per surgery.    Continue wound care.

## 2021-09-07 ENCOUNTER — Inpatient Hospital Stay (HOSPITAL_COMMUNITY): Payer: Medicare Other

## 2021-09-07 DIAGNOSIS — L03115 Cellulitis of right lower limb: Secondary | ICD-10-CM

## 2021-09-07 DIAGNOSIS — F419 Anxiety disorder, unspecified: Secondary | ICD-10-CM

## 2021-09-07 DIAGNOSIS — E871 Hypo-osmolality and hyponatremia: Secondary | ICD-10-CM | POA: Diagnosis not present

## 2021-09-07 DIAGNOSIS — F1120 Opioid dependence, uncomplicated: Secondary | ICD-10-CM

## 2021-09-07 DIAGNOSIS — K219 Gastro-esophageal reflux disease without esophagitis: Secondary | ICD-10-CM

## 2021-09-07 DIAGNOSIS — F32A Depression, unspecified: Secondary | ICD-10-CM

## 2021-09-07 DIAGNOSIS — D638 Anemia in other chronic diseases classified elsewhere: Secondary | ICD-10-CM | POA: Diagnosis not present

## 2021-09-07 DIAGNOSIS — R627 Adult failure to thrive: Secondary | ICD-10-CM | POA: Diagnosis not present

## 2021-09-07 DIAGNOSIS — Z91199 Patient's noncompliance with other medical treatment and regimen due to unspecified reason: Secondary | ICD-10-CM

## 2021-09-07 DIAGNOSIS — S81801S Unspecified open wound, right lower leg, sequela: Secondary | ICD-10-CM

## 2021-09-07 DIAGNOSIS — E876 Hypokalemia: Secondary | ICD-10-CM

## 2021-09-07 DIAGNOSIS — A419 Sepsis, unspecified organism: Secondary | ICD-10-CM | POA: Diagnosis not present

## 2021-09-07 DIAGNOSIS — G4730 Sleep apnea, unspecified: Secondary | ICD-10-CM

## 2021-09-07 DIAGNOSIS — D72829 Elevated white blood cell count, unspecified: Secondary | ICD-10-CM

## 2021-09-07 LAB — MAGNESIUM: Magnesium: 2 mg/dL (ref 1.7–2.4)

## 2021-09-07 LAB — CBC WITH DIFFERENTIAL/PLATELET
Abs Immature Granulocytes: 0.03 10*3/uL (ref 0.00–0.07)
Basophils Absolute: 0 10*3/uL (ref 0.0–0.1)
Basophils Relative: 1 %
Eosinophils Absolute: 0.2 10*3/uL (ref 0.0–0.5)
Eosinophils Relative: 3 %
HCT: 21.6 % — ABNORMAL LOW (ref 36.0–46.0)
Hemoglobin: 6.3 g/dL — CL (ref 12.0–15.0)
Immature Granulocytes: 0 %
Lymphocytes Relative: 36 %
Lymphs Abs: 2.5 10*3/uL (ref 0.7–4.0)
MCH: 25.2 pg — ABNORMAL LOW (ref 26.0–34.0)
MCHC: 29.2 g/dL — ABNORMAL LOW (ref 30.0–36.0)
MCV: 86.4 fL (ref 80.0–100.0)
Monocytes Absolute: 0.4 10*3/uL (ref 0.1–1.0)
Monocytes Relative: 5 %
Neutro Abs: 3.8 10*3/uL (ref 1.7–7.7)
Neutrophils Relative %: 55 %
Platelets: 331 10*3/uL (ref 150–400)
RBC: 2.5 MIL/uL — ABNORMAL LOW (ref 3.87–5.11)
RDW: 21.4 % — ABNORMAL HIGH (ref 11.5–15.5)
WBC: 7 10*3/uL (ref 4.0–10.5)
nRBC: 0 % (ref 0.0–0.2)

## 2021-09-07 LAB — COMPREHENSIVE METABOLIC PANEL
ALT: 18 U/L (ref 0–44)
AST: 13 U/L — ABNORMAL LOW (ref 15–41)
Albumin: 2.1 g/dL — ABNORMAL LOW (ref 3.5–5.0)
Alkaline Phosphatase: 75 U/L (ref 38–126)
Anion gap: 5 (ref 5–15)
BUN: 8 mg/dL (ref 8–23)
CO2: 30 mmol/L (ref 22–32)
Calcium: 7.7 mg/dL — ABNORMAL LOW (ref 8.9–10.3)
Chloride: 103 mmol/L (ref 98–111)
Creatinine, Ser: 0.4 mg/dL — ABNORMAL LOW (ref 0.44–1.00)
GFR, Estimated: >60 mL/min (ref >60)
Glucose, Bld: 82 mg/dL (ref 70–99)
Potassium: 3.2 mmol/L — ABNORMAL LOW (ref 3.5–5.1)
Sodium: 138 mmol/L (ref 135–145)
Total Bilirubin: 0.3 mg/dL (ref 0.3–1.2)
Total Protein: 4.8 g/dL — ABNORMAL LOW (ref 6.5–8.1)

## 2021-09-07 LAB — CBC
HCT: 29.6 % — ABNORMAL LOW (ref 36.0–46.0)
Hemoglobin: 8.7 g/dL — ABNORMAL LOW (ref 12.0–15.0)
MCH: 25.5 pg — ABNORMAL LOW (ref 26.0–34.0)
MCHC: 29.4 g/dL — ABNORMAL LOW (ref 30.0–36.0)
MCV: 86.8 fL (ref 80.0–100.0)
Platelets: 414 10*3/uL — ABNORMAL HIGH (ref 150–400)
RBC: 3.41 MIL/uL — ABNORMAL LOW (ref 3.87–5.11)
RDW: 20.1 % — ABNORMAL HIGH (ref 11.5–15.5)
WBC: 9.2 10*3/uL (ref 4.0–10.5)
nRBC: 0.2 % (ref 0.0–0.2)

## 2021-09-07 LAB — URINE CULTURE: Culture: <10000 — AB

## 2021-09-07 LAB — PROCALCITONIN: Procalcitonin: <0.1 ng/mL

## 2021-09-07 LAB — ABO/RH: ABO/RH(D): A POS

## 2021-09-07 LAB — PREPARE RBC (CROSSMATCH)

## 2021-09-07 MED ORDER — JUVEN PO PACK
1.0000 | PACK | Freq: Two times a day (BID) | ORAL | Status: DC
  Administered 2021-09-07 – 2021-09-15 (×16): 1 via ORAL
  Filled 2021-09-07 (×18): qty 1

## 2021-09-07 MED ORDER — ADULT MULTIVITAMIN W/MINERALS CH
1.0000 | ORAL_TABLET | Freq: Every day | ORAL | Status: DC
  Administered 2021-09-07 – 2021-09-15 (×9): 1 via ORAL
  Filled 2021-09-07 (×10): qty 1

## 2021-09-07 MED ORDER — SODIUM CHLORIDE 0.9 % IV SOLN
3.0000 g | Freq: Four times a day (QID) | INTRAVENOUS | Status: DC
  Administered 2021-09-07 – 2021-09-08 (×4): 3 g via INTRAVENOUS
  Filled 2021-09-07 (×7): qty 8

## 2021-09-07 MED ORDER — SODIUM CHLORIDE 0.9% IV SOLUTION: Freq: Once | INTRAVENOUS | Status: AC

## 2021-09-07 MED ORDER — ENSURE ENLIVE PO LIQD
237.0000 mL | Freq: Two times a day (BID) | ORAL | Status: DC
  Administered 2021-09-07 – 2021-09-15 (×17): 237 mL via ORAL

## 2021-09-07 NOTE — Progress Notes (Signed)
Initial Nutrition Assessment  INTERVENTION:   -Ensure Plus High Protein po BID, each supplement provides 350 kcal and 20 grams of protein.   -Multivitamin with minerals daily  -1 packet Juven BID, each packet provides 95 calories, 2.5 grams of protein (collagen), and 9.8 grams of carbohydrate (3 grams sugar); also contains 7 grams of L-arginine and L-glutamine, 300 mg vitamin C, 15 mg vitamin E, 1.2 mcg vitamin B-12, 9.5 mg zinc, 200 mg calcium, and 1.5 g  Calcium Beta-hydroxy-Beta-methylbutyrate to support wound healing   -Continue dysphagia 3 diet given pt is edentulous  NUTRITION DIAGNOSIS:   Increased nutrient needs related to wound healing as evidenced by estimated needs.  GOAL:   Patient will meet greater than or equal to 90% of their needs  MONITOR:   PO intake, Supplement acceptance, Labs, Weight trends, Skin, I & O's  REASON FOR ASSESSMENT:   Malnutrition Screening Tool    ASSESSMENT:   66 y.o. female with medical history significant for COPD, depression, hypertension, hypothyroidism, who presented to the ED with complaints of feeling ill.      Recent prolonged hospitalization 1/13-2/3. Admitted for cellulitis of RLE.  Patient in room, RN and nurse tech in room providing pt's morning medications.  Pt asking if she can eat. States she has not eaten in 3 days.  Diet was advanced 2/5 at 1428. Pt reports her appetite isn't as good as it was at discharge on 2/3. Pt enjoys Ensure and  was provided one by RN this morning. Will continue these given increased needs from multiple wounds. Encouraged pt to accept her Juven supplements as well to aid in wound healing.   Per weight records, current weight: 206 lbs.  I/Os: +1.9L since admit UOP: 3400 ml x 24 hrs  Medications: Miralax, IV Zofran  Labs reviewed: Low K   NUTRITION - FOCUSED PHYSICAL EXAM:  Flowsheet Row Most Recent Value  Orbital Region No depletion  Upper Arm Region No depletion  Thoracic and Lumbar Region  No depletion  Buccal Region No depletion  Temple Region No depletion  Clavicle Bone Region No depletion  Clavicle and Acromion Bone Region No depletion  Scapular Bone Region No depletion  Dorsal Hand No depletion  Patellar Region No depletion  Anterior Thigh Region No depletion  Posterior Calf Region No depletion  Edema (RD Assessment) Mild  [BLEs]  Hair Reviewed  Eyes Reviewed  Mouth Reviewed  [edentulous]  Skin Reviewed  Nails Reviewed       Diet Order:   Diet Order             DIET DYS 3 Room service appropriate? Yes; Fluid consistency: Thin  Diet effective now                   EDUCATION NEEDS:   Education needs have been addressed  Skin:  Skin Integrity Issues:: Stage II, Stage III, Other (Comment) Stage II: bilateral buttocks Stage III: right thigh Other: venous stasis ulcers to bilateral heels  Last BM:  2/3 -type 5  Height:   Ht Readings from Last 1 Encounters:  09/05/21 5\' 5"  (1.651 m)    Weight:   Wt Readings from Last 1 Encounters:  09/05/21 93.4 kg    BMI:  Body mass index is 34.28 kg/m.  Estimated Nutritional Needs:   Kcal:  2150-2350  Protein:  110-125g  Fluid:  2.3L/day   Clayton Bibles, MS, RD, LDN Inpatient Clinical Dietitian Contact information available via Amion

## 2021-09-07 NOTE — TOC Initial Note (Signed)
Transition of Care [facility]) - Initial/Assessment Note    Patient Details  Name: [name] MRN: [id number] Date of Birth: 01-29-1956  Transition of Care [facility]) CM/SW Contact:    Golda Acre, RN Phone Number: 09/07/2021, 12:00 PM  Clinical Narrative:                  Transition of Care [facility]) Screening Note   Patient Details  Name: [name] Date of Birth: 01-Aug-1956   Transition of Care [facility]) CM/SW Contact:    Golda Acre, RN Phone Number: 09/07/2021, 12:01 PM    Transition of Care Department [facility]) has reviewed patient and no TOC needs have been identified at this time. We will continue to monitor patient advancement through interdisciplinary progression rounds. If new patient transition needs arise, please place a TOC consult.    Expected Discharge Plan: Home w Home Health Services Barriers to Discharge: Continued Medical Work up   Patient Goals and CMS Choice Patient states their goals for this hospitalization and ongoing recovery are:: home CMS Medicare.gov Compare Post Acute Care list provided to:: Patient    Expected Discharge Plan and Services Expected Discharge Plan: Home w Home Health Services   Discharge Planning Services: CM Consult Post Acute Care Choice: Resumption of Svcs/PTA Provider Living arrangements for the past 2 months: Single Family Home                                      Prior Living Arrangements/Services Living arrangements for the past 2 months: Single Family Home Lives with:: Self Patient language and need for interpreter reviewed:: Yes Do you feel safe going back to the place where you live?: Yes            Criminal Activity/Legal Involvement Pertinent to Current Situation/Hospitalization: No - Comment as needed  Activities of Daily Living Home Assistive Devices/Equipment: None ADL Screening (condition at time of admission) Patient's cognitive ability adequate to safely complete daily activities?: Yes Is  the patient deaf or have difficulty hearing?: No Does the patient have difficulty seeing, even when wearing glasses/contacts?: No Does the patient have difficulty concentrating, remembering, or making decisions?: Yes Patient able to express need for assistance with ADLs?: Yes Does the patient have difficulty dressing or bathing?: Yes Independently performs ADLs?: No Communication: Independent Dressing (OT): Needs assistance Is this a change from baseline?: Pre-admission baseline Grooming: Needs assistance Is this a change from baseline?: Pre-admission baseline Feeding: Independent Bathing: Needs assistance Is this a change from baseline?: Pre-admission baseline Toileting: Needs assistance Is this a change from baseline?: Pre-admission baseline In/Out Bed: Needs assistance Is this a change from baseline?: Pre-admission baseline Walks in Home: Needs assistance Is this a change from baseline?: Pre-admission baseline Does the patient have difficulty walking or climbing stairs?: Yes Weakness of Legs: Both Weakness of Arms/Hands: Both  Permission Sought/Granted                  Emotional Assessment Appearance:: Appears stated age Attitude/Demeanor/Rapport: Other (comment) Affect (typically observed): Accepting Orientation: : Oriented to Self, Oriented to Place, Oriented to  Time, Oriented to Situation Alcohol / Substance Use: Not Applicable Psych Involvement: No (comment)  Admission diagnosis:  Hyponatremia [E87.1] Wound infection [T14.8XXA, L08.9] Septic shock (HCC) [A41.9, R65.21] Sepsis, due to unspecified organism, unspecified whether acute organ dysfunction present [facility]) [A41.9] Patient Active Problem List   Diagnosis Date Noted   Open  leg wound, right, sequela 09/06/2021   Hypokalemia 09/06/2021   Anemia, chronic disease 09/06/2021   Noncompliance 09/06/2021   Hyponatremia 09/06/2021   Leukocytosis 09/06/2021   Edentulous 09/06/2021   Home Infestation by bed bug  09/06/2021   COPD (chronic obstructive pulmonary disease) (HCC) 09/06/2021   Opioid dependence (HCC) 09/06/2021   Chronic hypotension 09/06/2021   Pressure injury of skin 08/17/2021   Sepsis (HCC) 08/15/2021   Septic shock (HCC) 08/15/2021   Cellulitis of right lower extremity    Wound cellulitis 06/16/2021   Hypothyroidism 06/16/2021   HLD (hyperlipidemia) 06/16/2021   Anxiety 06/16/2021   Fall at home, initial encounter 06/16/2021   Adult failure to thrive 06/16/2021   Arm mass, right 03/04/2021   Acute respiratory failure with hypoxia (HCC) 03/04/2021   COPD exacerbation (HCC) 03/01/2021   Acute exacerbation of chronic obstructive pulmonary disease (COPD) (HCC) 02/28/2021   Depression 04/04/2018   Venous insufficiency (chronic) (peripheral) 01/11/2018   BRONCHITIS, ACUTE 10/21/2008   Essential hypertension 07/13/2007   Asthma 07/11/2007   GERD (gastroesophageal reflux disease) 07/11/2007   Sleep apnea 07/11/2007   Shortness of breath 07/11/2007   PCP:  Rodrigo Ran, MD Pharmacy:   [facility]- [place], [place] - [address] [place] [address] Phone: [phone] Fax: [phone]  [id number] - [place], [place] - [address] [place] [address] Phone: [phone] Fax: [phone]  [facility] [phone] - [place], [place] - [address] [place] [address] Phone: [phone] Fax: [phone]     Social Determinants of Health (SDOH) Interventions    Readmission Risk Interventions No flowsheet data found.

## 2021-09-07 NOTE — Progress Notes (Signed)
Pt refusing CPAP QHS.  ?

## 2021-09-07 NOTE — Plan of Care (Signed)
Discussed with patient plan of care for the evening, pain management and foley with wounds with some teach back displayed.  Problem: Education: Goal: Knowledge of General Education information will improve Description: Including pain rating scale, medication(s)/side effects and non-pharmacologic comfort measures Outcome: Progressing   Problem: Health Behavior/Discharge Planning: Goal: Ability to manage health-related needs will improve Outcome: Progressing

## 2021-09-07 NOTE — Progress Notes (Signed)
PROGRESS NOTE    [name]  [id number] DOB: 1955-11-12 DOA: 09/05/2021 PCP: Crist Infante, MD    Brief Narrative:  [name] is a 66 y.o. female with medical history significant for COPD, depression, hypertension, hypothyroidism, who presented to the emergency department with feeling of not feeling well.  Of note patient was recently discharged from [facility] long [facility] after being treated for septic shock secondary to right lower extremity cellulitis and had completed 10-day course of antibiotic.  General surgery did not recommend any surgical intervention.  She also required midodrine and was taken off Florinef.  After discharge from the hospital, she continued to report worsening pain in the leg.  This is her third hospitalization for the same problem.  On previous admission patient had refused skilled nursing facility placement.  In the ED, temperature was 101.6 F, was tachycardic and tachypneic.  Blood pressure was low which improved with 2 L of IV fluid bolus and patient was given IV Levophed.  Femur x-ray showed large defect in the soft tissue with soft tissue edema.  Patient was started on IV vancomycin cefepime metronidazole and was admitted to hospital for further evaluation and treatment. .     Assessment and Plan: * Cellulitis of right lower extremity- (present on admission) CT scan showing no evidence of abscess or deep infection.  General surgery reviewed and did not recommend any debridement.  Continue wound care and antibiotics.  It looks like patient is unable to manage the wounds at home.  She has refused skilled nursing facility in the previous admission.  Would likely need a skilled nursing facility placement.  ID has been consulted at this time and antibiotics has been changed to Unasyn at this time.   Septic shock ([place])- (present on admission) Resolved at this time.  Was on Levophed which has been discontinued.  Likely secondary to right lower extremity cellulitis.   Had recently completed 10-day course of antibiotic.  ID has seen the patient and antibiotics has been changed to Unasyn at this time.   Chronic hypotension- (present on admission) Patient was initiated on midodrine on last admission.  We will continue.  Received norepinephrine during hospitalization.  Holding antihypertensives.  Off Levophed at this time.  Opioid dependence ([place])- (present on admission) Morphine especially during dressing changes.  COPD (chronic obstructive pulmonary disease) ([place])- (present on admission) Continue bronchodilators oxygen.  On 3 L of oxygen at this time.  Home Infestation by bed bug- (present on admission) TOC on board for safe disposition.  Edentulous- (present on admission) We will put the patient on dysphagia 3 soft diet for now.  Leukocytosis- (present on admission) Resolved at this time.  Hyponatremia- (present on admission) Improved with hydration.  Noncompliance Third admission for the same.  Unable to care for self at home.  Will need placement.  Anemia, chronic disease- (present on admission) Latest hemoglobin of 6.3.  Will receive 1 unit of packed RBC.  On heparin.  We will continue for now.  No evidence of acute bleeding.  Check CBC in AM.  Patient is pretty much in bed at this time and has high risk for DVT  Hypokalemia- (present on admission) Mild.  We will continue to replenish.  Check labs in AM.  Open leg wound, right, sequela No need for surgical debridement as per surgery.  Wound care on board.  Continue wound care.   Pressure injury of skin- (present on admission) - Continue air mattress overlay at all times  Adult failure  to thrive- (present on admission) poor social situation in setting of chronic open right leg wound, difficult to heal, recurrent skin infections and prolonged hospitalizations.  Encourage oral nutrition.  Anxiety- (present on admission) Continue Xanax for now.  HLD (hyperlipidemia)- (present on  admission) Continue atorvastatin  Hypothyroidism- (present on admission) Continue Synthroid  Depression- (present on admission) - Resume home Effexor and Wellbutrin  Sleep apnea- (present on admission) Continue nightly CPAP  GERD (gastroesophageal reflux disease)- (present on admission) Continue Protonix.  Essential hypertension- (present on admission) Antihypertensives are on hold due to hypotension.  On midodrine.  DNR (do not resuscitate)-resolved as of 09/07/2021, (present on admission) -confirmed and will continue DNR order while in hospital      DVT prophylaxis: heparin injection 5,000 Units Start: 09/05/21 2200   Code Status:     Code Status: DNR  Disposition:  Status is: Inpatient  Remains inpatient appropriate because: IV antibiotics, wound care, need for placement    Family Communication:   Consultants:  Infectious disease,  general surgery  Procedures:  Wound care  Antimicrobials:  Cefepime Metro Vanco 09/05/21>09/07/21 Unasyn 2/6>  Anti-infectives (From admission, onward)    Start     Dose/Rate Route Frequency Ordered Stop   09/07/21 1230  Ampicillin-Sulbactam (UNASYN) 3 g in sodium chloride 0.9 % 100 mL IVPB        3 g 200 mL/hr over 30 Minutes Intravenous Every 6 hours 09/07/21 1122     09/06/21 0000  vancomycin (VANCOCIN) IVPB 1000 mg/200 mL premix        1,000 mg 200 mL/hr over 60 Minutes Intravenous Every 12 hours 09/05/21 1239     09/05/21 2300  metroNIDAZOLE (FLAGYL) IVPB 500 mg  Status:  Discontinued        500 mg 100 mL/hr over 60 Minutes Intravenous Every 12 hours 09/05/21 1947 09/07/21 1122   09/05/21 2000  ceFEPIme (MAXIPIME) 2 g in sodium chloride 0.9 % 100 mL IVPB  Status:  Discontinued        2 g 200 mL/hr over 30 Minutes Intravenous Every 8 hours 09/05/21 1239 09/07/21 1122   09/05/21 1200  vancomycin (VANCOREADY) IVPB 2000 mg/400 mL        2,000 mg 200 mL/hr over 120 Minutes Intravenous  Once 09/05/21 1106 09/05/21 1408    09/05/21 1100  ceFEPIme (MAXIPIME) 2 g in sodium chloride 0.9 % 100 mL IVPB        2 g 200 mL/hr over 30 Minutes Intravenous  Once 09/05/21 1050 09/05/21 1148   09/05/21 1100  metroNIDAZOLE (FLAGYL) IVPB 500 mg        500 mg 100 mL/hr over 60 Minutes Intravenous  Once 09/05/21 1050 09/05/21 1222   09/05/21 1100  vancomycin (VANCOCIN) IVPB 1000 mg/200 mL premix  Status:  Discontinued        1,000 mg 200 mL/hr over 60 Minutes Intravenous  Once 09/05/21 1050 09/05/21 1106       Subjective: Today, patient was seen and examined at bedside.  Patient states that she does not feel well and has not had a bowel movement in 2 days.  Complains of mild nausea.  Objective: Vitals:   09/07/21 0841 09/07/21 0935 09/07/21 1034 09/07/21 1310  BP: (!) 91/54 116/61 (!) 115/59 116/61  Pulse: 71  83 95  Resp: 16 14 17 16   Temp: 98.5 F (36.9 C)  97.8 F (36.6 C) 97.9 F (36.6 C)  TempSrc: Oral  Oral Oral  SpO2: 99%  97% 96%  Weight:      Height:        Intake/Output Summary (Last 24 hours) at 09/07/2021 1436 Last data filed at 09/07/2021 1322 Gross per 24 hour  Intake 3276.87 ml  Output 2200 ml  Net 1076.87 ml   Filed Weights   09/05/21 1038 09/05/21 1857  Weight: 94.3 kg 93.4 kg    Physical Examination:  General:  Average built, not in obvious distress HENT:   No scleral pallor or icterus noted. Oral mucosa is moist.  Chest:  Clear breath sounds.  Diminished breath sounds bilaterally. No crackles or wheezes.  CVS: S1 &S2 heard. No murmur.  Regular rate and rhythm. Abdomen: Soft, nontender, nondistended.  Bowel sounds are heard.   Extremities: No cyanosis, clubbing or edema.  Right upper thigh wound on bandage.  Right heel wound with some drainage Psych: Alert, awake and communicative normal mood CNS:  No cranial nerve deficits.  Power equal in all extremities.   Skin: Warm and dry., skin defect right thigh as below     Data Reviewed:   CBC: Recent Labs  Lab 09/02/21 0403  09/04/21 0635 09/05/21 1041 09/06/21 0554 09/07/21 0359  WBC 7.0 9.3 13.8* 9.3 7.0  NEUTROABS  --   --  9.9*  --  3.8  HGB 7.3* 7.7* 8.3* 8.2* 6.3*  HCT 25.3* 26.3* 28.0* 28.1* 21.6*  MCV 88.5 86.2 85.9 87.0 86.4  PLT 495* 460* 460* 430* [id number]    Basic Metabolic Panel: Recent Labs  Lab 09/01/21 0450 09/02/21 0403 09/04/21 0635 09/05/21 1041 09/06/21 0554 09/07/21 0359  NA 135 138  --  128* 137 138  K 3.3* 3.8 4.0 4.0 3.4* 3.2*  CL 92* 99  --  92* 101 103  CO2 36* 34*  --  27 28 30   GLUCOSE 108* 105*  --  115* 169* 82  BUN 24* 24*  --  13 9 8   CREATININE 0.65 0.42*  --  0.58 0.44 0.40*  CALCIUM 8.5* 8.4*  --  8.1* 8.1* 7.7*  MG  --   --  2.1 2.0  --  2.0    GFR: Estimated Creatinine Clearance: 79.2 mL/min (A) (by C-G formula based on SCr of 0.4 mg/dL (L)).  Liver Function Tests: Recent Labs  Lab 09/01/21 0450 09/02/21 0403 09/05/21 1041 09/07/21 0359  AST 22 20 25  13*  ALT 28 26 26 18   ALKPHOS 85 79 119 75  BILITOT 0.4 0.2* 0.5 0.3  PROT 5.6* 5.4* 6.0* 4.8*  ALBUMIN 2.6* 2.4* 2.7* 2.1*    CBG: No results for input(s): GLUCAP in the last 168 hours.    Recent Results (from the past 240 hour(s))  Blood Culture (routine x 2)     Status: None (Preliminary result)   Collection Time: 09/05/21 10:41 AM   Specimen: Right Antecubital; Blood  Result Value Ref Range Status   Specimen Description   Final    RIGHT ANTECUBITAL BOTTLES DRAWN AEROBIC AND ANAEROBIC   Special Requests Blood Culture adequate volume  Final   Culture   Final    NO GROWTH < 24 HOURS Performed at [facility], [address], [place], [place] [address]    Report Status PENDING  Incomplete  Urine Culture     Status: Abnormal   Collection Time: 09/05/21 10:41 AM   Specimen: Urine, Clean Catch  Result Value Ref Range Status   Specimen Description   Final    URINE, CLEAN CATCH Performed at [facility], [address],  [place], [place] [address]    Special Requests   Final    URINE, CLEAN  CATCH Performed at [facility], [address], [place], [place] [address]    Culture (A)  Final    <10,000 COLONIES/mL INSIGNIFICANT GROWTH Performed at [facility] [address] [place], [place] [address]    Report Status 09/07/2021 FINAL  Final  Resp Panel by RT-PCR (Flu A&B, Covid) Nasopharyngeal Swab     Status: None   Collection Time: 09/05/21 10:42 AM   Specimen: Nasopharyngeal Swab; Nasopharyngeal(NP) swabs in vial transport medium  Result Value Ref Range Status   SARS Coronavirus 2 by RT PCR NEGATIVE NEGATIVE Final    Comment: (NOTE) SARS-CoV-2 target nucleic acids are NOT DETECTED.  The SARS-CoV-2 RNA is generally detectable in upper respiratory specimens during the acute phase of infection. The lowest concentration of SARS-CoV-2 viral copies this assay can detect is 138 copies/mL. A negative result does not preclude SARS-Cov-2 infection and should not be used as the sole basis for treatment or other patient management decisions. A negative result may occur with  improper specimen collection/handling, submission of specimen other than nasopharyngeal swab, presence of viral mutation(s) within the areas targeted by this assay, and inadequate number of viral copies(<138 copies/mL). A negative result must be combined with clinical observations, patient history, and epidemiological information. The expected result is Negative.  Fact Sheet for Patients:  EntrepreneurPulse.com.au  Fact Sheet for Healthcare Providers:  IncredibleEmployment.be  This test is no t yet approved or cleared by the [place] FDA and  has been authorized for detection and/or diagnosis of SARS-CoV-2 by FDA under an Emergency Use Authorization (EUA). This EUA will remain  in effect (meaning this test can be used) for the duration of the COVID-19 declaration under Section 564(b)(1) of the Act, 21 U.S.C.section 360bbb-3(b)(1), unless the authorization is  terminated  or revoked sooner.       Influenza A by PCR NEGATIVE NEGATIVE Final   Influenza B by PCR NEGATIVE NEGATIVE Final    Comment: (NOTE) The Xpert Xpress SARS-CoV-2/FLU/RSV plus assay is intended as an aid in the diagnosis of influenza from Nasopharyngeal swab specimens and should not be used as a sole basis for treatment. Nasal washings and aspirates are unacceptable for Xpert Xpress SARS-CoV-2/FLU/RSV testing.  Fact Sheet for Patients: EntrepreneurPulse.com.au  Fact Sheet for Healthcare Providers: IncredibleEmployment.be  This test is not yet approved or cleared by the [place] FDA and has been authorized for detection and/or diagnosis of SARS-CoV-2 by FDA under an Emergency Use Authorization (EUA). This EUA will remain in effect (meaning this test can be used) for the duration of the COVID-19 declaration under Section 564(b)(1) of the Act, 21 U.S.C. section 360bbb-3(b)(1), unless the authorization is terminated or revoked.  Performed at [facility], [address], [place], [place] [address]   Blood Culture (routine x 2)     Status: None (Preliminary result)   Collection Time: 09/05/21 11:11 AM   Specimen: Left Antecubital; Blood  Result Value Ref Range Status   Specimen Description   Final    LEFT ANTECUBITAL BOTTLES DRAWN AEROBIC AND ANAEROBIC   Special Requests Blood Culture adequate volume  Final   Culture   Final    NO GROWTH < 24 HOURS Performed at [facility], [address], [place], [place] [address]    Report Status PENDING  Incomplete      Radiology Studies: CT FEMUR RIGHT W CONTRAST  Result Date: 09/05/2021 CLINICAL DATA:  Right posterior leg wound. EXAM: CT OF THE LOWER RIGHT EXTREMITY WITH CONTRAST TECHNIQUE: Multidetector CT imaging of the lower right extremity was performed according to the standard protocol following intravenous contrast administration. RADIATION DOSE REDUCTION: This exam was performed  according to the departmental dose-optimization program which includes automated exposure control, adjustment of the mA and/or kV according to patient size and/or use of iterative reconstruction technique. CONTRAST:  128mL OMNIPAQUE IOHEXOL 300 MG/ML  SOLN COMPARISON:  08/15/2021 FINDINGS: Bones/Joint/Cartilage No acute fracture or dislocation. Mild osteoarthritis of the right SI joint. Mild osteoarthritis of the right hip. Severe tricompartmental osteoarthritis of the right knee with numerous loose bodies and bulky marginal osteophytes. Small right knee joint effusion. Normal alignment. No periosteal reaction or bone destruction. Ligaments Ligaments are suboptimally evaluated by CT. Muscles, Tendons and Soft tissue 4.1 x 3.4 x 3 cm soft tissue wound along the posterior upper thigh extending to the lateral margin of the flexor compartment muscles. Second slightly inferiorly located 4 x 3.1 x 3.6 cm soft tissue wound involving the posterior upper thigh with the wound extending superficial to the inferior peripheral aspect of the gluteus maximus muscle. Posteromedially in the upper thigh there is a 2.7 x 3.6 x 4.7 cm soft tissue wound. Soft tissue edema and skin thickening of the subcutaneous fat along the thigh consistent with cellulitis extending into the lower leg. No focal fluid collection to suggest an abscess. IMPRESSION: 1. Three soft tissue wounds in the right posterior thigh as described above. 2. Cellulitis of the right thigh and lower leg. 3. No evidence of osteomyelitis. No drainable fluid collection to suggest an abscess. 4. Severe tricompartmental osteoarthritis of the right knee with numerous loose bodies and bulky marginal osteophytes. Electronically Signed   By: Kathreen Devoid M.D.   On: 09/05/2021 17:39     Scheduled Meds:  atorvastatin  10 mg Oral Daily   buPROPion  150 mg Oral QPM   Chlorhexidine Gluconate Cloth  6 each Topical Daily   feeding supplement  237 mL Oral BID BM   gabapentin  300  mg Oral QHS   heparin  5,000 Units Subcutaneous Q8H   levothyroxine  150 mcg Oral QAC breakfast   liver oil-zinc oxide   Topical QID   midodrine  15 mg Oral TID WC   mometasone-formoterol  2 puff Inhalation BID   multivitamin with minerals  1 tablet Oral Daily   nutrition supplement (JUVEN)  1 packet Oral BID BM   polyethylene glycol  17 g Oral BID   sodium hypochlorite   Topical BID   venlafaxine XR  150 mg Oral QHS   Continuous Infusions:  sodium chloride     ampicillin-sulbactam (UNASYN) IV     norepinephrine (LEVOPHED) Adult infusion Stopped (09/06/21 1432)   vancomycin 1,000 mg (09/07/21 1231)     LOS: 2 days    Flora Lipps, MD [facility] 09/07/2021, 2:36 PM

## 2021-09-07 NOTE — Consult Note (Signed)
[facility]    Date of Admission:  09/05/2021   Total days of inpatient antibiotics 3        Reason for Consult: Cepsis    Principal Problem:   Cellulitis of right lower extremity Active Problems:   Essential hypertension   GERD (gastroesophageal reflux disease)   Sleep apnea   Depression   Hypothyroidism   HLD (hyperlipidemia)   Anxiety   Adult failure to thrive   Septic shock (HCC)   Pressure injury of skin   Open leg wound, right, sequela   Hypokalemia   Anemia, chronic disease   Noncompliance   Hyponatremia   Leukocytosis   Edentulous   Home Infestation by bed bug   COPD (chronic obstructive pulmonary disease) (HCC)   Opioid dependence (HCC)   Chronic hypotension   Assessment: [name] with multiple RLE wounds and recent admission for sepsis 2/2 RLE cellulitis presents day after discharge with RLE pain. Admitted for sepsis 2/2 RLE cellulitis.  #Fever/leukocytosis in the setting of multiple RLE wounds -Prior admission 01/13-2/3 treated with vanc+pip-tazo x 10days. ID consulted and antibiotics discontinued.  -09/05/21 CT showed upper thigh wounds and RLE cellulitis -Pt did not allow me to remove right upper thigh bandage. Right heel wound was draining.  -Will transition to vancomycin + unasyn. She did grow resistant ecoli  in september 2019 from urine, given it was 3.5 years ago will narrow antibiotic coverage.  Recommendations:  -D/C cefepime and metronidazole -Start unasyn -Continue vancomycin -Follow Blood Cx -Wound care consult. Will attempt to remove bandages tomorrow -Xray right foot [name] -Consider SNF on discharge as pt lives alone Microbiology:   Antibiotics: Vancomycin 2/4-p Metronidazole 2/4-p Cefepime 2/4-2p   Prior hospitalization: Vancomycin 1/13-1/22 Pip-tazo 1/14-1/24 Metronidazole 1/14 Cefepime 1/14  Cultures: Blood 2/4 NG 2/4 Urine Cx-insignificant growth   HPI: [name] is a 66 y.o. female   with COPD, depression, HTN, hypothyroidism, recent hospitalization 1/13-2/3 for spetic shock 2/2 RLE cellulitis. CT during the hospitalization showed marked severity of cellulitis. She received vancomycin + pip-tazo x 10 days. ID was consulted and as pt was clnically stable after 10 days of antibiotics, recommend to stop abx. She required pressors and transitioned to florinef and midrodrine for persistent hypotension. She was discharged to home and returned to the hospital the next day with worsening RLE pain. On arrival to the ED she had a temp 101.6, wbc. She was started on vancomycin, cefepime and metronidazole.  CT again noted wounds along upper thigh and cellulitis extending to lower leg.  Pt reports she lives alone. Once, she was home she had a fall leading to worsening RLE pain.    Review of Systems: Review of Systems  All other systems reviewed and are negative.  Past Medical History:  Diagnosis Date   COPD (chronic obstructive pulmonary disease) (HCC)    Gout    Hypercholesteremia    Sepsis (HCC)     Social History   Tobacco Use   Smoking status: Every Day    Packs/day: 1.00    Years: 30.00    Pack years: 30.00    Types: Cigarettes   Smokeless tobacco: Never  Substance Use Topics   Alcohol use: No    Alcohol/week: 0.0 standard drinks   Drug use: No    History reviewed. No pertinent family history. Scheduled Meds:  atorvastatin  10 mg Oral Daily   buPROPion  150 mg Oral QPM   Chlorhexidine  Gluconate Cloth  6 each Topical Daily   feeding supplement  237 mL Oral BID BM   gabapentin  300 mg Oral QHS   heparin  5,000 Units Subcutaneous Q8H   levothyroxine  150 mcg Oral QAC breakfast   liver oil-zinc oxide   Topical QID   midodrine  15 mg Oral TID WC   mometasone-formoterol  2 puff Inhalation BID   multivitamin with minerals  1 tablet Oral Daily   nutrition supplement (JUVEN)  1 packet Oral BID BM   polyethylene glycol  17 g Oral BID   sodium hypochlorite   Topical  BID   venlafaxine XR  150 mg Oral QHS   Continuous Infusions:  sodium chloride     sodium chloride 75 mL/hr at 09/07/21 0700   ampicillin-sulbactam (UNASYN) IV     norepinephrine (LEVOPHED) Adult infusion Stopped (09/06/21 1432)   vancomycin 1,000 mg (09/06/21 2346)   PRN Meds:.acetaminophen **OR** acetaminophen, ALPRAZolam, ipratropium-albuterol, magic mouthwash w/lidocaine, morphine injection, ondansetron **OR** ondansetron (ZOFRAN) IV, polyethylene glycol Allergies  Allergen Reactions   Gemfibrozil     Other reaction(s): Sick to stomach   Nitroglycerin     Other reaction(s): Cough, Irritation   Other Other (See Comments)    Grape fruit juice - unknown   Pravastatin     Other reaction(s): Dizzy   Sulfamethoxazole-Trimethoprim     Other reaction(s): N/V   Tyloxapol     Other reaction(s): Does not work    OBJECTIVE: Blood pressure (!) 115/59, pulse 83, temperature 97.8 F (36.6 C), temperature source Oral, resp. rate 17, height 5\' 5"  (1.651 m), weight 93.4 kg, SpO2 97 %.  Physical Exam Constitutional:      Appearance: Normal appearance.  HENT:     Head: Normocephalic and atraumatic.     Right Ear: Tympanic membrane normal.     Left Ear: Tympanic membrane normal.     Nose: Nose normal.     Mouth/Throat:     Mouth: Mucous membranes are moist.  Eyes:     Extraocular Movements: Extraocular movements intact.     Conjunctiva/sclera: Conjunctivae normal.     Pupils: Pupils are equal, round, and reactive to light.  Cardiovascular:     Rate and Rhythm: Normal rate and regular rhythm.     Heart sounds: No murmur heard.   No friction rub. No gallop.  Pulmonary:     Effort: Pulmonary effort is normal.     Breath sounds: Normal breath sounds.  Abdominal:     General: Abdomen is flat.     Palpations: Abdomen is soft.  Musculoskeletal:        General: Normal range of motion.  Skin:    General: Skin is warm and dry.     Comments: Right upper thigh wound bandaged Right heel  wound draining  Neurological:     General: No focal deficit present.     Mental Status: She is alert and oriented to person, place, and time.  Psychiatric:        Mood and Affect: Mood normal.    Lab Results Lab Results  Component Value Date   WBC 7.0 09/07/2021   HGB 6.3 (LL) 09/07/2021   HCT 21.6 (L) 09/07/2021   MCV 86.4 09/07/2021   PLT 331 09/07/2021    Lab Results  Component Value Date   CREATININE 0.40 (L) 09/07/2021   BUN 8 09/07/2021   NA 138 09/07/2021   K 3.2 (L) 09/07/2021   CL 103 09/07/2021   CO2  30 09/07/2021    Lab Results  Component Value Date   ALT 18 09/07/2021   AST 13 (L) 09/07/2021   ALKPHOS 75 09/07/2021   BILITOT 0.3 09/07/2021       Danelle Earthly, MD [facility] 09/07/2021, 12:16 PM

## 2021-09-08 DIAGNOSIS — A419 Sepsis, unspecified organism: Secondary | ICD-10-CM | POA: Diagnosis not present

## 2021-09-08 DIAGNOSIS — E871 Hypo-osmolality and hyponatremia: Secondary | ICD-10-CM | POA: Diagnosis not present

## 2021-09-08 DIAGNOSIS — L03115 Cellulitis of right lower limb: Secondary | ICD-10-CM | POA: Diagnosis not present

## 2021-09-08 DIAGNOSIS — D638 Anemia in other chronic diseases classified elsewhere: Secondary | ICD-10-CM | POA: Diagnosis not present

## 2021-09-08 DIAGNOSIS — R627 Adult failure to thrive: Secondary | ICD-10-CM | POA: Diagnosis not present

## 2021-09-08 LAB — CBC WITH DIFFERENTIAL/PLATELET
Abs Immature Granulocytes: 0.03 10*3/uL (ref 0.00–0.07)
Basophils Absolute: 0 10*3/uL (ref 0.0–0.1)
Basophils Relative: 1 %
Eosinophils Absolute: 0.2 10*3/uL (ref 0.0–0.5)
Eosinophils Relative: 3 %
HCT: 27.7 % — ABNORMAL LOW (ref 36.0–46.0)
Hemoglobin: 8.2 g/dL — ABNORMAL LOW (ref 12.0–15.0)
Immature Granulocytes: 1 %
Lymphocytes Relative: 48 %
Lymphs Abs: 3.1 10*3/uL (ref 0.7–4.0)
MCH: 25.7 pg — ABNORMAL LOW (ref 26.0–34.0)
MCHC: 29.6 g/dL — ABNORMAL LOW (ref 30.0–36.0)
MCV: 86.8 fL (ref 80.0–100.0)
Monocytes Absolute: 0.5 10*3/uL (ref 0.1–1.0)
Monocytes Relative: 7 %
Neutro Abs: 2.6 10*3/uL (ref 1.7–7.7)
Neutrophils Relative %: 40 %
Platelets: 378 10*3/uL (ref 150–400)
RBC: 3.19 MIL/uL — ABNORMAL LOW (ref 3.87–5.11)
RDW: 20 % — ABNORMAL HIGH (ref 11.5–15.5)
WBC: 6.4 10*3/uL (ref 4.0–10.5)
nRBC: 0 % (ref 0.0–0.2)

## 2021-09-08 LAB — COMPREHENSIVE METABOLIC PANEL
ALT: 17 U/L (ref 0–44)
AST: 14 U/L — ABNORMAL LOW (ref 15–41)
Albumin: 2.3 g/dL — ABNORMAL LOW (ref 3.5–5.0)
Alkaline Phosphatase: 79 U/L (ref 38–126)
Anion gap: 6 (ref 5–15)
BUN: 12 mg/dL (ref 8–23)
CO2: 32 mmol/L (ref 22–32)
Calcium: 8.2 mg/dL — ABNORMAL LOW (ref 8.9–10.3)
Chloride: 101 mmol/L (ref 98–111)
Creatinine, Ser: 0.46 mg/dL (ref 0.44–1.00)
GFR, Estimated: >60 mL/min (ref >60)
Glucose, Bld: 84 mg/dL (ref 70–99)
Potassium: 3.4 mmol/L — ABNORMAL LOW (ref 3.5–5.1)
Sodium: 139 mmol/L (ref 135–145)
Total Bilirubin: 0.3 mg/dL (ref 0.3–1.2)
Total Protein: 5.3 g/dL — ABNORMAL LOW (ref 6.5–8.1)

## 2021-09-08 LAB — TYPE AND SCREEN
ABO/RH(D): A POS
Antibody Screen: NEGATIVE
Unit division: 0

## 2021-09-08 LAB — BPAM RBC
Blood Product Expiration Date: 202302212359
ISSUE DATE / TIME: 202302060812
Unit Type and Rh: 6200

## 2021-09-08 LAB — MAGNESIUM: Magnesium: 2 mg/dL (ref 1.7–2.4)

## 2021-09-08 MED ORDER — DOXYCYCLINE HYCLATE 100 MG PO TABS
100.0000 mg | ORAL_TABLET | Freq: Two times a day (BID) | ORAL | Status: AC
  Administered 2021-09-08 – 2021-09-14 (×13): 100 mg via ORAL
  Filled 2021-09-08 (×13): qty 1

## 2021-09-08 MED ORDER — AMOXICILLIN-POT CLAVULANATE 875-125 MG PO TABS
1.0000 | ORAL_TABLET | Freq: Two times a day (BID) | ORAL | Status: AC
  Administered 2021-09-08 – 2021-09-14 (×13): 1 via ORAL
  Filled 2021-09-08 (×13): qty 1

## 2021-09-08 MED ORDER — POTASSIUM CHLORIDE CRYS ER 20 MEQ PO TBCR
40.0000 meq | EXTENDED_RELEASE_TABLET | Freq: Once | ORAL | Status: AC
  Administered 2021-09-08: 40 meq via ORAL
  Filled 2021-09-08: qty 2

## 2021-09-08 NOTE — Progress Notes (Signed)
Pt refuses CPAP QHS. 

## 2021-09-08 NOTE — Progress Notes (Addendum)
PROGRESS NOTE    [name]  [id number] DOB: 1955/11/01 DOA: 09/05/2021 PCP: Crist Infante, MD    Brief Narrative:  [name] is a 66 y.o. female with medical history significant for COPD, depression, hypertension, hypothyroidism, who presented to the emergency department with feeling of not feeling well.  Of note patient was recently discharged from [facility] after being treated for septic shock secondary to right lower extremity cellulitis and had completed 10-day course of antibiotic.  General surgery did not recommend any surgical intervention.  Patient also required midodrine and was taken off Florinef.  After discharge from the hospital, patient continued to report worsening pain in the leg.  This is her third hospitalization for the same problem.  On previous admission, patient had refused skilled nursing facility placement.  In the ED, temperature was 101.6 F, was tachycardic and tachypneic.  Blood pressure was low which improved with 2 L of IV fluid bolus and patient was given IV Levophed.  Femur x-ray showed large defect in the soft tissue with soft tissue edema.  Patient was started on IV vancomycin cefepime metronidazole and was admitted to hospital for further evaluation and treatment.  Infectious disease was consulted during hospitalization and antibiotics has been changed to Unasyn at this time. .     Assessment and Plan: * Cellulitis of right lower extremity- (present on admission) CT scan showing no evidence of abscess or deep infection.  General surgery reviewed and did not recommend any debridement.  Continue wound care and antibiotics.  It looks like patient is unable to manage the wounds at home.  Patient has refused skilled nursing facility in the previous admission.  Patient will likely require skilled nursing facility placement.  ID has been consulted at this time and antibiotics has been changed to Unasyn at this time.   Septic shock ([place])- (present on  admission) Resolved at this time.  Patient was initially on Levophed which has been discontinued.  Likely secondary to right lower extremity cellulitis.  Had recently completed 10-day course of antibiotic.  ID has seen the patient and antibiotics has been changed to Unasyn at this time.   Chronic hypotension- (present on admission) Patient was initiated on midodrine on last admission.  Continue to hold antihypertensives.  Off Levophed at this time.  Opioid dependence ([place])- (present on admission) Morphine especially during dressing changes.  COPD (chronic obstructive pulmonary disease) ([place])- (present on admission) Continue bronchodilators oxygen.  On nasal cannula 2 L/min.  Home Infestation by bed bug- (present on admission) TOC on board for safe disposition.  Edentulous- (present on admission) We will put the patient on dysphagia 3 soft diet for now.  Leukocytosis- (present on admission) Resolved at this time.  Hyponatremia- (present on admission) Improved with hydration.  Latest sodium of 139.  Noncompliance Third admission for the same.  Unable to care for self at home.  Will need placement.  Anemia, chronic disease- (present on admission) Latest hemoglobin of 8.2 from 6.3..  Received 1 unit of packed RBC.  On heparin.  We will continue for now.  No evidence of acute bleeding.  Patient is pretty much in bed at this time and has high risk for DVT.  Check CBC in AM.  Hypokalemia- (present on admission) Mild.  We will continue to replace orally.  Check levels in a.m.  Open leg wound, right, sequela No need for surgical debridement as per surgery.  Wound care on board.  Continue wound care.   Pressure injury  of skin- (present on admission) - Continue air mattress overlay at all times  Adult failure to thrive- (present on admission) poor social situation in setting of chronic open right leg wound, difficult to heal, recurrent skin infections and prolonged hospitalizations.   Encourage oral nutrition.  Anxiety- (present on admission) Continue Xanax for now.  HLD (hyperlipidemia)- (present on admission) Continue atorvastatin  Hypothyroidism- (present on admission) Continue Synthroid  Depression- (present on admission) Continue Effexor and Wellbutrin  Sleep apnea- (present on admission) Continue nightly CPAP  GERD (gastroesophageal reflux disease)- (present on admission) Continue Protonix.  Essential hypertension- (present on admission) Antihypertensives are on hold due to hypotension.  On midodrine.   DVT prophylaxis: heparin injection 5,000 Units Start: 09/05/21 2200   Code Status:     Code Status: DNR  Disposition:  Status is: Inpatient  Remains inpatient appropriate because: IV antibiotics, wound care, need for placement    Family Communication:  Spoke with the patient at bedside.  Unable to reach the patient's friend Ms. Tonya on the phone available.  Consultants:  Infectious disease,  general surgery  Procedures:  Wound care  Antimicrobials:  Unasyn 2/6>  Anti-infectives (From admission, onward)    Start     Dose/Rate Route Frequency Ordered Stop   09/07/21 1230  Ampicillin-Sulbactam (UNASYN) 3 g in sodium chloride 0.9 % 100 mL IVPB        3 g 200 mL/hr over 30 Minutes Intravenous Every 6 hours 09/07/21 1122     09/06/21 0000  vancomycin (VANCOCIN) IVPB 1000 mg/200 mL premix        1,000 mg 200 mL/hr over 60 Minutes Intravenous Every 12 hours 09/05/21 1239     09/05/21 2300  metroNIDAZOLE (FLAGYL) IVPB 500 mg  Status:  Discontinued        500 mg 100 mL/hr over 60 Minutes Intravenous Every 12 hours 09/05/21 1947 09/07/21 1122   09/05/21 2000  ceFEPIme (MAXIPIME) 2 g in sodium chloride 0.9 % 100 mL IVPB  Status:  Discontinued        2 g 200 mL/hr over 30 Minutes Intravenous Every 8 hours 09/05/21 1239 09/07/21 1122   09/05/21 1200  vancomycin (VANCOREADY) IVPB 2000 mg/400 mL        2,000 mg 200 mL/hr over 120 Minutes  Intravenous  Once 09/05/21 1106 09/05/21 1408   09/05/21 1100  ceFEPIme (MAXIPIME) 2 g in sodium chloride 0.9 % 100 mL IVPB        2 g 200 mL/hr over 30 Minutes Intravenous  Once 09/05/21 1050 09/05/21 1148   09/05/21 1100  metroNIDAZOLE (FLAGYL) IVPB 500 mg        500 mg 100 mL/hr over 60 Minutes Intravenous  Once 09/05/21 1050 09/05/21 1222   09/05/21 1100  vancomycin (VANCOCIN) IVPB 1000 mg/200 mL premix  Status:  Discontinued        1,000 mg 200 mL/hr over 60 Minutes Intravenous  Once 09/05/21 1050 09/05/21 1106       Subjective: Today, patient was seen and examined at bedside.  Patient states that she does not feel well and been hurting in her body.  Denies any nausea vomiting fever or chills.  Complains about pain over her right thigh.  Objective: Vitals:   09/07/21 2200 09/07/21 2300 09/08/21 0000 09/08/21 0458  BP:    (!) 87/57  Pulse:    79  Resp: 15 15 17 16   Temp:    98.9 F (37.2 C)  TempSrc:    Oral  SpO2:   98% 97%  Weight:      Height:        Intake/Output Summary (Last 24 hours) at 09/08/2021 1107 Last data filed at 09/08/2021 1000 Gross per 24 hour  Intake 1401.85 ml  Output 4100 ml  Net -2698.15 ml   Filed Weights   09/05/21 1038 09/05/21 1857  Weight: 94.3 kg 93.4 kg    Physical Examination:  General:  Average built, not in obvious distress HENT:   No scleral pallor or icterus noted. Oral mucosa is moist.  Chest:  Clear breath sounds.  Diminished breath sounds bilaterally. No crackles or wheezes.  CVS: S1 &S2 heard. No murmur.  Regular rate and rhythm. Abdomen: Soft, nontender, nondistended.  Bowel sounds are heard.   Extremities: No cyanosis, clubbing or edema.  Right upper thigh wound with bandage, right heel wound with some drainage. Psych: Alert, awake and communicative normal mood CNS:  No cranial nerve deficits.  Power equal in all extremities.   Skin: Warm and dry., skin defect right thigh as below     Data Reviewed:   CBC: Recent  Labs  Lab 09/05/21 1041 09/06/21 0554 09/07/21 0359 09/07/21 1609 09/08/21 0433  WBC 13.8* 9.3 7.0 9.2 6.4  NEUTROABS 9.9*  --  3.8  --  2.6  HGB 8.3* 8.2* 6.3* 8.7* 8.2*  HCT 28.0* 28.1* 21.6* 29.6* 27.7*  MCV 85.9 87.0 86.4 86.8 86.8  PLT 460* 430* 331 414* [id number]    Basic Metabolic Panel: Recent Labs  Lab 09/02/21 0403 09/04/21 0635 09/05/21 1041 09/06/21 0554 09/07/21 0359 09/08/21 0433  NA 138  --  128* 137 138 139  K 3.8 4.0 4.0 3.4* 3.2* 3.4*  CL 99  --  92* 101 103 101  CO2 34*  --  27 28 30  32  GLUCOSE 105*  --  115* 169* 82 84  BUN 24*  --  13 9 8 12   CREATININE 0.42*  --  0.58 0.44 0.40* 0.46  CALCIUM 8.4*  --  8.1* 8.1* 7.7* 8.2*  MG  --  2.1 2.0  --  2.0 2.0    GFR: Estimated Creatinine Clearance: 79.2 mL/min (by C-G formula based on SCr of 0.46 mg/dL).  Liver Function Tests: Recent Labs  Lab 09/02/21 0403 09/05/21 1041 09/07/21 0359 09/08/21 0433  AST 20 25 13* 14*  ALT 26 26 18 17   ALKPHOS 79 119 75 79  BILITOT 0.2* 0.5 0.3 0.3  PROT 5.4* 6.0* 4.8* 5.3*  ALBUMIN 2.4* 2.7* 2.1* 2.3*    CBG: No results for input(s): GLUCAP in the last 168 hours.    Recent Results (from the past 240 hour(s))  Blood Culture (routine x 2)     Status: None (Preliminary result)   Collection Time: 09/05/21 10:41 AM   Specimen: Right Antecubital; Blood  Result Value Ref Range Status   Specimen Description   Final    RIGHT ANTECUBITAL BOTTLES DRAWN AEROBIC AND ANAEROBIC   Special Requests Blood Culture adequate volume  Final   Culture   Final    NO GROWTH 3 DAYS Performed at [facility], [address], [place], [place] [address]    Report Status PENDING  Incomplete  Urine Culture     Status: Abnormal   Collection Time: 09/05/21 10:41 AM   Specimen: Urine, Clean Catch  Result Value Ref Range Status   Specimen Description   Final    URINE, CLEAN CATCH Performed at [facility], [address], [place], [place]  [place]    Special Requests   Final     URINE, CLEAN CATCH Performed at [facility], [address], [place], [place] [address]    Culture (A)  Final    <10,000 COLONIES/mL INSIGNIFICANT GROWTH Performed at [facility] [address], [place], [place] [address]    Report Status 09/07/2021 FINAL  Final  Resp Panel by RT-PCR (Flu A&B, Covid) Nasopharyngeal Swab     Status: None   Collection Time: 09/05/21 10:42 AM   Specimen: Nasopharyngeal Swab; Nasopharyngeal(NP) swabs in vial transport medium  Result Value Ref Range Status   SARS Coronavirus 2 by RT PCR NEGATIVE NEGATIVE Final    Comment: (NOTE) SARS-CoV-2 target nucleic acids are NOT DETECTED.  The SARS-CoV-2 RNA is generally detectable in upper respiratory specimens during the acute phase of infection. The lowest concentration of SARS-CoV-2 viral copies this assay can detect is 138 copies/mL. A negative result does not preclude SARS-Cov-2 infection and should not be used as the sole basis for treatment or other patient management decisions. A negative result may occur with  improper specimen collection/handling, submission of specimen other than nasopharyngeal swab, presence of viral mutation(s) within the areas targeted by this assay, and inadequate number of viral copies(<138 copies/mL). A negative result must be combined with clinical observations, patient history, and epidemiological information. The expected result is Negative.  Fact Sheet for Patients:  EntrepreneurPulse.com.au  Fact Sheet for Healthcare Providers:  IncredibleEmployment.be  This test is no t yet approved or cleared by the [place] FDA and  has been authorized for detection and/or diagnosis of SARS-CoV-2 by FDA under an Emergency Use Authorization (EUA). This EUA will remain  in effect (meaning this test can be used) for the duration of the COVID-19 declaration under Section 564(b)(1) of the Act, 21 U.S.C.section 360bbb-3(b)(1), unless the  authorization is terminated  or revoked sooner.       Influenza A by PCR NEGATIVE NEGATIVE Final   Influenza B by PCR NEGATIVE NEGATIVE Final    Comment: (NOTE) The Xpert Xpress SARS-CoV-2/FLU/RSV plus assay is intended as an aid in the diagnosis of influenza from Nasopharyngeal swab specimens and should not be used as a sole basis for treatment. Nasal washings and aspirates are unacceptable for Xpert Xpress SARS-CoV-2/FLU/RSV testing.  Fact Sheet for Patients: EntrepreneurPulse.com.au  Fact Sheet for Healthcare Providers: IncredibleEmployment.be  This test is not yet approved or cleared by the [place] FDA and has been authorized for detection and/or diagnosis of SARS-CoV-2 by FDA under an Emergency Use Authorization (EUA). This EUA will remain in effect (meaning this test can be used) for the duration of the COVID-19 declaration under Section 564(b)(1) of the Act, 21 U.S.C. section 360bbb-3(b)(1), unless the authorization is terminated or revoked.  Performed at [facility], [address] [place], [place] [address]   Blood Culture (routine x 2)     Status: None (Preliminary result)   Collection Time: 09/05/21 11:11 AM   Specimen: Left Antecubital; Blood  Result Value Ref Range Status   Specimen Description   Final    LEFT ANTECUBITAL BOTTLES DRAWN AEROBIC AND ANAEROBIC   Special Requests Blood Culture adequate volume  Final   Culture   Final    NO GROWTH 3 DAYS Performed at [facility], [address] [place], [place] [address]    Report Status PENDING  Incomplete      Radiology Studies: DG Ankle Complete Right  Result Date: 09/07/2021 CLINICAL DATA:  Right ankle wound. EXAM:  RIGHT ANKLE - COMPLETE 3+ VIEW COMPARISON:  None. FINDINGS: There is no evidence of fracture, dislocation, or joint effusion. There is no evidence of arthropathy or other focal bone abnormality. Soft tissues are unremarkable. IMPRESSION: Negative.  Electronically Signed   By: Marijo Conception M.D.   On: 09/07/2021 14:58   DG Foot Complete Right  Result Date: 09/07/2021 CLINICAL DATA:  Right foot wound. EXAM: RIGHT FOOT COMPLETE - 3+ VIEW COMPARISON:  None. FINDINGS: There is no evidence of fracture or dislocation. Extensive osteophyte formation is seen involving the superior portion of the intertarsal and tarsometatarsal joints. Soft tissues are unremarkable. IMPRESSION: Degenerative changes as described above. No acute abnormality is noted. Electronically Signed   By: Marijo Conception M.D.   On: 09/07/2021 15:00     Scheduled Meds:  atorvastatin  10 mg Oral Daily   buPROPion  150 mg Oral QPM   Chlorhexidine Gluconate Cloth  6 each Topical Daily   feeding supplement  237 mL Oral BID BM   gabapentin  300 mg Oral QHS   heparin  5,000 Units Subcutaneous Q8H   levothyroxine  150 mcg Oral QAC breakfast   liver oil-zinc oxide   Topical QID   midodrine  15 mg Oral TID WC   mometasone-formoterol  2 puff Inhalation BID   multivitamin with minerals  1 tablet Oral Daily   nutrition supplement (JUVEN)  1 packet Oral BID BM   polyethylene glycol  17 g Oral BID   sodium hypochlorite   Topical BID   venlafaxine XR  150 mg Oral QHS   Continuous Infusions:  sodium chloride     ampicillin-sulbactam (UNASYN) IV 3 g (09/08/21 0923)   norepinephrine (LEVOPHED) Adult infusion Stopped (09/06/21 1432)   vancomycin 1,000 mg (09/08/21 0027)     LOS: 3 days    Flora Lipps, MD [facility] 09/08/2021, 11:07 AM

## 2021-09-08 NOTE — TOC Progression Note (Addendum)
Transition of Care [facility]) - Progression Note    Patient Details  Name: [name] MRN: [id number] Date of Birth: [birth year]  Transition of Care [facility]) CM/SW Contact  Golda Acre, RN Phone Number: 09/08/2021, 10:36 AM  Clinical Narrative:    Mills Koller relative/ patient is saying that she is homeless but has an address in the system.   Pt may need rehab in order to regain her strength.  Asking the case worker myself to get her a debit card and food stamps.  Informed her that this is not something I can do. [facility] Azucena Cecil has been living with friend tonya brown. Tct-tonya [phone].message lefton voice mail to please return call. Kevan Rosebush friend-she can stay with me if she is able to walk and take care of herself.  Expected Discharge Plan: Home w Home Health Services Barriers to Discharge: Continued Medical Work up  Expected Discharge Plan and Services Expected Discharge Plan: Home w Home Health Services   Discharge Planning Services: CM Consult Post Acute Care Choice: Resumption of Svcs/PTA Provider Living arrangements for the past 2 months: Single Family Home                                       Social Determinants of Health (SDOH) Interventions    Readmission Risk Interventions No flowsheet data found.

## 2021-09-08 NOTE — Progress Notes (Signed)
[facility] for Infectious Disease  Date of Admission:  09/05/2021   Total days of inpatient antibiotics 3  Principal Problem:   Cellulitis of right lower extremity Active Problems:   Essential hypertension   GERD (gastroesophageal reflux disease)   Sleep apnea   Depression   Hypothyroidism   HLD (hyperlipidemia)   Anxiety   Adult failure to thrive   Septic shock (HCC)   Pressure injury of skin   Open leg wound, right, sequela   Hypokalemia   Anemia, chronic disease   Noncompliance   Hyponatremia   Leukocytosis   Edentulous   Home Infestation by bed bug   COPD (chronic obstructive pulmonary disease) (HCC)   Opioid dependence ([place])   Chronic hypotension          Assessment: [name] with multiple RLE wounds and recent admission for sepsis 2/2 RLE cellulitis presents day after discharge with RLE pain. Admitted for sepsis 2/2 RLE cellulitis.   #Fever/leukocytosis in the setting of multiple RLE wounds -Prior admission 01/13-2/3 treated with vanc+pip-tazo x 10days. ID consulted and antibiotics discontinued.  -09/05/21 CT showed upper thigh wounds and RLE cellulitis -Pt did not allow me to remove right upper thigh bandage. Right heel wound was draining.  -Transition to vancomycin + unasyn.  - Upper thigh wound today did not show signs of skin soft tissue infection. As such will transition to PO. -Pt fell at home following last discharge, I think the fever/leukocytosis on admission could represent an aspiration event. As for cellulitis seen on CT: imaging findings lag behind clinical improvement. I do think she will benefit from SNF.  Recommendations:  -D/C unasyn and vancomycin -Start doxycyline and augmentin to complete 10 days of antibiotics(EOT 2/13) for possible complicated skin soft tissue infection as I did not see the wound on admission. -Wound care consult. Will attempt to remove bandages tomorrow - Consider SNF on discharge as pt lives alone -Follow-up with  ID following discharge  Microbiology:   Antibiotics: Antibiotics: Vancomycin 2/4-p Metronidazole 2/4-2/6 Cefepime 2/4-2/6 Unasyn 2/6-p     Prior hospitalization: Vancomycin 1/13-1/22 Pip-tazo 1/14-1/24 Metronidazole 1/14 Cefepime 1/14 Cultures: Blood 2/4 NG 2/4 Urine Cx-insignificant growth   SUBJECTIVE: Resting in bed. No new complaints  Interval: Afebrile overnight.  Review of Systems: Review of Systems  All other systems reviewed and are negative.   Scheduled Meds:  amoxicillin-clavulanate  1 tablet Oral Q12H   atorvastatin  10 mg Oral Daily   buPROPion  150 mg Oral QPM   Chlorhexidine Gluconate Cloth  6 each Topical Daily   doxycycline  100 mg Oral Q12H   feeding supplement  237 mL Oral BID BM   gabapentin  300 mg Oral QHS   heparin  5,000 Units Subcutaneous Q8H   levothyroxine  150 mcg Oral QAC breakfast   liver oil-zinc oxide   Topical QID   midodrine  15 mg Oral TID WC   mometasone-formoterol  2 puff Inhalation BID   multivitamin with minerals  1 tablet Oral Daily   nutrition supplement (JUVEN)  1 packet Oral BID BM   polyethylene glycol  17 g Oral BID   sodium hypochlorite   Topical BID   venlafaxine XR  150 mg Oral QHS   Continuous Infusions:  sodium chloride     PRN Meds:.acetaminophen **OR** acetaminophen, ALPRAZolam, ipratropium-albuterol, magic mouthwash w/lidocaine, morphine injection, ondansetron **OR** ondansetron (ZOFRAN) IV, polyethylene glycol Allergies  Allergen Reactions   Gemfibrozil     Other  reaction(s): Sick to stomach   Nitroglycerin     Other reaction(s): Cough, Irritation   Other Other (See Comments)    Grape fruit juice - unknown   Pravastatin     Other reaction(s): Dizzy   Sulfamethoxazole-Trimethoprim     Other reaction(s): N/V   Tyloxapol     Other reaction(s): Does not work    OBJECTIVE: Vitals:   09/08/21 1943 09/08/21 1958 09/08/21 2000 09/08/21 2049  BP:    (!) 110/52  Pulse:    80  Resp:  13 13 15   Temp:     98.4 F (36.9 C)  TempSrc:    Oral  SpO2: 96%   97%  Weight:      Height:       Body mass index is 34.28 kg/m.  Physical Exam Constitutional:      Appearance: Normal appearance.  HENT:     Head: Normocephalic and atraumatic.     Right Ear: Tympanic membrane normal.     Left Ear: Tympanic membrane normal.     Nose: Nose normal.     Mouth/Throat:     Mouth: Mucous membranes are moist.  Eyes:     Extraocular Movements: Extraocular movements intact.     Conjunctiva/sclera: Conjunctivae normal.     Pupils: Pupils are equal, round, and reactive to light.  Cardiovascular:     Rate and Rhythm: Normal rate and regular rhythm.     Heart sounds: No murmur heard.   No friction rub. No gallop.  Pulmonary:     Effort: Pulmonary effort is normal.     Breath sounds: Normal breath sounds.  Abdominal:     General: Abdomen is flat.     Palpations: Abdomen is soft.  Musculoskeletal:        General: Normal range of motion.  Skin:    General: Skin is warm and dry.     Comments: RLE thigh wound without surrounding erythema  Neurological:     General: No focal deficit present.     Mental Status: She is alert and oriented to person, place, and time.  Psychiatric:        Mood and Affect: Mood normal.      Lab Results Lab Results  Component Value Date   WBC 6.4 09/08/2021   HGB 8.2 (L) 09/08/2021   HCT 27.7 (L) 09/08/2021   MCV 86.8 09/08/2021   PLT 378 09/08/2021    Lab Results  Component Value Date   CREATININE 0.46 09/08/2021   BUN 12 09/08/2021   NA 139 09/08/2021   K 3.4 (L) 09/08/2021   CL 101 09/08/2021   CO2 32 09/08/2021    Lab Results  Component Value Date   ALT 17 09/08/2021   AST 14 (L) 09/08/2021   ALKPHOS 79 09/08/2021   BILITOT 0.3 09/08/2021        [name], [place] [facility] 09/08/2021, 9:31 PM

## 2021-09-08 NOTE — Plan of Care (Signed)

## 2021-09-09 ENCOUNTER — Other Ambulatory Visit: Payer: Self-pay | Admitting: Internal Medicine

## 2021-09-09 DIAGNOSIS — E871 Hypo-osmolality and hyponatremia: Secondary | ICD-10-CM | POA: Diagnosis not present

## 2021-09-09 DIAGNOSIS — L03115 Cellulitis of right lower limb: Secondary | ICD-10-CM | POA: Diagnosis not present

## 2021-09-09 DIAGNOSIS — D638 Anemia in other chronic diseases classified elsewhere: Secondary | ICD-10-CM | POA: Diagnosis not present

## 2021-09-09 DIAGNOSIS — R627 Adult failure to thrive: Secondary | ICD-10-CM | POA: Diagnosis not present

## 2021-09-09 DIAGNOSIS — A419 Sepsis, unspecified organism: Secondary | ICD-10-CM | POA: Diagnosis not present

## 2021-09-09 LAB — COMPREHENSIVE METABOLIC PANEL
ALT: 15 U/L (ref 0–44)
AST: 15 U/L (ref 15–41)
Albumin: 2.4 g/dL — ABNORMAL LOW (ref 3.5–5.0)
Alkaline Phosphatase: 78 U/L (ref 38–126)
Anion gap: 5 (ref 5–15)
BUN: 17 mg/dL (ref 8–23)
CO2: 32 mmol/L (ref 22–32)
Calcium: 8.8 mg/dL — ABNORMAL LOW (ref 8.9–10.3)
Chloride: 99 mmol/L (ref 98–111)
Creatinine, Ser: 0.51 mg/dL (ref 0.44–1.00)
GFR, Estimated: >60 mL/min (ref >60)
Glucose, Bld: 99 mg/dL (ref 70–99)
Potassium: 4.1 mmol/L (ref 3.5–5.1)
Sodium: 136 mmol/L (ref 135–145)
Total Bilirubin: 0.3 mg/dL (ref 0.3–1.2)
Total Protein: 5.4 g/dL — ABNORMAL LOW (ref 6.5–8.1)

## 2021-09-09 LAB — CBC WITH DIFFERENTIAL/PLATELET
Abs Immature Granulocytes: 0.06 10*3/uL (ref 0.00–0.07)
Basophils Absolute: 0 10*3/uL (ref 0.0–0.1)
Basophils Relative: 1 %
Eosinophils Absolute: 0.2 10*3/uL (ref 0.0–0.5)
Eosinophils Relative: 2 %
HCT: 30.3 % — ABNORMAL LOW (ref 36.0–46.0)
Hemoglobin: 8.8 g/dL — ABNORMAL LOW (ref 12.0–15.0)
Immature Granulocytes: 1 %
Lymphocytes Relative: 48 %
Lymphs Abs: 4.4 10*3/uL — ABNORMAL HIGH (ref 0.7–4.0)
MCH: 25.9 pg — ABNORMAL LOW (ref 26.0–34.0)
MCHC: 29 g/dL — ABNORMAL LOW (ref 30.0–36.0)
MCV: 89.1 fL (ref 80.0–100.0)
Monocytes Absolute: 0.6 10*3/uL (ref 0.1–1.0)
Monocytes Relative: 7 %
Neutro Abs: 3.6 10*3/uL (ref 1.7–7.7)
Neutrophils Relative %: 41 %
Platelets: 441 10*3/uL — ABNORMAL HIGH (ref 150–400)
RBC: 3.4 MIL/uL — ABNORMAL LOW (ref 3.87–5.11)
RDW: 20.4 % — ABNORMAL HIGH (ref 11.5–15.5)
WBC: 8.9 10*3/uL (ref 4.0–10.5)
nRBC: 0 % (ref 0.0–0.2)

## 2021-09-09 LAB — MAGNESIUM: Magnesium: 2.1 mg/dL (ref 1.7–2.4)

## 2021-09-09 NOTE — Progress Notes (Signed)
PROGRESS NOTE    [name]  [id number] DOB: [birth year] DOA: 09/05/2021 PCP: Crist Infante, MD    Brief Narrative:  [name] is a 66 y.o. female with medical history significant for COPD, depression, hypertension, hypothyroidism, who presented to the emergency department with feeling of not feeling well.  Of note patient was recently discharged from [facility] long hospital after being treated for septic shock secondary to right lower extremity cellulitis and had completed 10-day course of antibiotic.  General surgery did not recommend any surgical intervention.  Patient also required midodrine and was taken off Florinef.  After discharge from the hospital, patient continued to report worsening pain in the leg.  This is her third hospitalization for the same problem.  On previous admission, patient had refused skilled nursing facility placement.  In the ED, temperature was 101.6 F, was tachycardic and tachypneic.  Blood pressure was low which improved with 2 L of IV fluid bolus and patient was given IV Levophed.  Femur x-ray showed large defect in the soft tissue with soft tissue edema.  Patient was started on IV vancomycin cefepime metronidazole and was admitted to hospital for further evaluation and treatment.  Infectious disease was consulted during hospitalization and antibiotics has been changed to Unasyn at this time. .     Assessment and Plan: * Cellulitis of right lower extremity- (present on admission) CT scan showing no evidence of abscess or deep infection.  General surgery reviewed and did not recommend any debridement.  Continue wound care and antibiotics.  It looks like patient is unable to manage the wounds at home.  Patient has refused skilled nursing facility in the previous admission.  Patient will likely require skilled nursing facility placement.  ID has been consulted at this time and antibiotics have been transitioned to Unasyn followed by doxycycline and Augmentin to  complete 10-day course of antibiotic till 09/14/2021  Septic shock ([place])- (present on admission) Resolved at this time.  Patient was initially on Levophed which has been discontinued.  Likely secondary to right lower extremity cellulitis.  Had recently completed 10-day course of antibiotic.  ID has seen the patient and antibiotics has been changed to oral at this time to continue until 09/14/2021  Chronic hypotension- (present on admission) Patient was initiated on midodrine on last admission.  Continue to hold antihypertensives.  Off Levophed at this time.  Opioid dependence ([place])- (present on admission) Morphine especially during dressing changes.  COPD (chronic obstructive pulmonary disease) ([place])- (present on admission) Continue bronchodilators, oxygen.  On nasal cannula 3 L/min.  Home Infestation by bed bug- (present on admission) TOC been consulted for safe disposition.  Edentulous- (present on admission) on dysphagia 3 soft diet   Leukocytosis- (present on admission) Resolved at this time.  Latest WBC at 8.9  Hyponatremia- (present on admission) Improved with hydration.  Latest sodium of 139.  Noncompliance Third admission for the same.  Unable to care for self at home.  Will need placement.  TOC has been consulted  Anemia, chronic disease- (present on admission) Latest hemoglobin of 8.8 from 8.2< 6.3..  Received 1 unit of packed RBC during hospitalization.  Hypokalemia- (present on admission) Improved.  Latest potassium of 4.1.  Open leg wound, right, sequela No need for surgical debridement as per surgery.  Wound care on board.  Continue wound care.   Pressure injury of skin- (present on admission) - Continue air mattress overlay at all times  Adult failure to thrive- (present on admission) poor social situation  in setting of chronic open right leg wound, difficult to heal, recurrent skin infections and prolonged hospitalizations.  Encourage oral  nutrition.  Anxiety- (present on admission) Continue Xanax for now.  HLD (hyperlipidemia)- (present on admission) Continue atorvastatin  Hypothyroidism- (present on admission) Continue Synthroid  Depression- (present on admission) Continue Effexor and Wellbutrin  Sleep apnea- (present on admission) Continue nightly CPAP, but has been refusing  GERD (gastroesophageal reflux disease)- (present on admission) Continue Protonix.  Essential hypertension- (present on admission) Antihypertensives are on hold due to hypotension.  Continue to hold for now.  Continue midodrine.   DVT prophylaxis: heparin injection 5,000 Units Start: 09/05/21 2200   Code Status:     Code Status: DNR  Disposition:  Status is: Inpatient  Remains inpatient appropriate because:  antibiotics, wound care, need for placement    Family Communication:  Spoke with the patient at bedside.    Consultants:  Infectious disease,  general surgery  Procedures:  Wound care  Antimicrobials:  Doxycycline and amoxicillin  Anti-infectives (From admission, onward)    Start     Dose/Rate Route Frequency Ordered Stop   09/08/21 2200  doxycycline (VIBRA-TABS) tablet 100 mg        100 mg Oral Every 12 hours 09/08/21 1317 09/15/21 0959   09/08/21 2200  amoxicillin-clavulanate (AUGMENTIN) 875-125 MG per tablet 1 tablet        1 tablet Oral Every 12 hours 09/08/21 1317 09/15/21 0959   09/07/21 1230  Ampicillin-Sulbactam (UNASYN) 3 g in sodium chloride 0.9 % 100 mL IVPB  Status:  Discontinued        3 g 200 mL/hr over 30 Minutes Intravenous Every 6 hours 09/07/21 1122 09/08/21 1317   09/06/21 0000  vancomycin (VANCOCIN) IVPB 1000 mg/200 mL premix  Status:  Discontinued        1,000 mg 200 mL/hr over 60 Minutes Intravenous Every 12 hours 09/05/21 1239 09/08/21 1317   09/05/21 2300  metroNIDAZOLE (FLAGYL) IVPB 500 mg  Status:  Discontinued        500 mg 100 mL/hr over 60 Minutes Intravenous Every 12 hours 09/05/21  1947 09/07/21 1122   09/05/21 2000  ceFEPIme (MAXIPIME) 2 g in sodium chloride 0.9 % 100 mL IVPB  Status:  Discontinued        2 g 200 mL/hr over 30 Minutes Intravenous Every 8 hours 09/05/21 1239 09/07/21 1122   09/05/21 1200  vancomycin (VANCOREADY) IVPB 2000 mg/400 mL        2,000 mg 200 mL/hr over 120 Minutes Intravenous  Once 09/05/21 1106 09/05/21 1408   09/05/21 1100  ceFEPIme (MAXIPIME) 2 g in sodium chloride 0.9 % 100 mL IVPB        2 g 200 mL/hr over 30 Minutes Intravenous  Once 09/05/21 1050 09/05/21 1148   09/05/21 1100  metroNIDAZOLE (FLAGYL) IVPB 500 mg        500 mg 100 mL/hr over 60 Minutes Intravenous  Once 09/05/21 1050 09/05/21 1222   09/05/21 1100  vancomycin (VANCOCIN) IVPB 1000 mg/200 mL premix  Status:  Discontinued        1,000 mg 200 mL/hr over 60 Minutes Intravenous  Once 09/05/21 1050 09/05/21 1106       Subjective: Today, patient was seen and examined at bedside.  Complains of mild pain over the thigh area.  Denies any fever chills vomiting but has mild nausea.  Says she feels little depressed  Objective: Vitals:   09/09/21 [id number] 09/09/21 0554 09/09/21 0630 09/09/21 [id number]  BP: (!) 94/49 (!) 114/49    Pulse: 73 82    Resp: 17  15   Temp: 98.6 F (37 C)     TempSrc: Oral     SpO2: 100% 97%  97%  Weight:      Height:        Intake/Output Summary (Last 24 hours) at 09/09/2021 1157 Last data filed at 09/09/2021 0005 Gross per 24 hour  Intake 560 ml  Output 1300 ml  Net -740 ml    Filed Weights   09/05/21 1038 09/05/21 1857  Weight: 94.3 kg 93.4 kg    Physical Examination:  General:  Average built, not in obvious distress, mildly anxious HENT:   No scleral pallor or icterus noted. Oral mucosa is moist.  Chest:  Clear breath sounds.  Diminished breath sounds bilaterally. No crackles or wheezes.  CVS: S1 &S2 heard. No murmur.  Regular rate and rhythm. Abdomen: Soft, nontender, nondistended.  Bowel sounds are heard.   Extremities: No cyanosis,  clubbing or edema.  Peripheral pulses are palpable.  Right upper thigh wound with dressing, right heel wound Psych: Alert, awake and oriented, normal mood CNS:  No cranial nerve deficits.  Power equal in all extremities.   Skin: Warm and dry.  Right thigh and right heel wound     Data Reviewed:   CBC: Recent Labs  Lab 09/05/21 1041 09/06/21 0554 09/07/21 0359 09/07/21 1609 09/08/21 0433 09/09/21 0408  WBC 13.8* 9.3 7.0 9.2 6.4 8.9  NEUTROABS 9.9*  --  3.8  --  2.6 3.6  HGB 8.3* 8.2* 6.3* 8.7* 8.2* 8.8*  HCT 28.0* 28.1* 21.6* 29.6* 27.7* 30.3*  MCV 85.9 87.0 86.4 86.8 86.8 89.1  PLT 460* 430* 331 414* 378 441*     Basic Metabolic Panel: Recent Labs  Lab 09/04/21 0635 09/05/21 1041 09/06/21 0554 09/07/21 0359 09/08/21 0433 09/09/21 0408  NA  --  128* 137 138 139 136  K 4.0 4.0 3.4* 3.2* 3.4* 4.1  CL  --  92* 101 103 101 99  CO2  --  27 28 30  32 32  GLUCOSE  --  115* 169* 82 84 99  BUN  --  13 9 8 12 17   CREATININE  --  0.58 0.44 0.40* 0.46 0.51  CALCIUM  --  8.1* 8.1* 7.7* 8.2* 8.8*  MG 2.1 2.0  --  2.0 2.0 2.1     GFR: Estimated Creatinine Clearance: 79.2 mL/min (by C-G formula based on SCr of 0.51 mg/dL).  Liver Function Tests: Recent Labs  Lab 09/05/21 1041 09/07/21 0359 09/08/21 0433 09/09/21 0408  AST 25 13* 14* 15  ALT 26 18 17 15   ALKPHOS 119 75 79 78  BILITOT 0.5 0.3 0.3 0.3  PROT 6.0* 4.8* 5.3* 5.4*  ALBUMIN 2.7* 2.1* 2.3* 2.4*     CBG: No results for input(s): GLUCAP in the last 168 hours.    Recent Results (from the past 240 hour(s))  Blood Culture (routine x 2)     Status: None (Preliminary result)   Collection Time: 09/05/21 10:41 AM   Specimen: Right Antecubital; Blood  Result Value Ref Range Status   Specimen Description   Final    RIGHT ANTECUBITAL BOTTLES DRAWN AEROBIC AND ANAEROBIC   Special Requests Blood Culture adequate volume  Final   Culture   Final    NO GROWTH 4 DAYS Performed at [facility], [address], [place], [place] [address]    Report Status PENDING  Incomplete  Urine Culture     Status: Abnormal   Collection Time: 09/05/21 10:41 AM   Specimen: Urine, Clean Catch  Result Value Ref Range Status   Specimen Description   Final    URINE, CLEAN CATCH Performed at [facility], [address], [place], [place] [address]    Special Requests   Final    URINE, CLEAN CATCH Performed at [facility], [address], [place], [place] [address]    Culture (A)  Final    <10,000 COLONIES/mL INSIGNIFICANT GROWTH Performed at [facility] [address] [place], [place] [address]    Report Status 09/07/2021 FINAL  Final  Resp Panel by RT-PCR (Flu A&B, Covid) Nasopharyngeal Swab     Status: None   Collection Time: 09/05/21 10:42 AM   Specimen: Nasopharyngeal Swab; Nasopharyngeal(NP) swabs in vial transport medium  Result Value Ref Range Status   SARS Coronavirus 2 by RT PCR NEGATIVE NEGATIVE Final    Comment: (NOTE) SARS-CoV-2 target nucleic acids are NOT DETECTED.  The SARS-CoV-2 RNA is generally detectable in upper respiratory specimens during the acute phase of infection. The lowest concentration of SARS-CoV-2 viral copies this assay can detect is 138 copies/mL. A negative result does not preclude SARS-Cov-2 infection and should not be used as the sole basis for treatment or other patient management decisions. A negative result may occur with  improper specimen collection/handling, submission of specimen other than nasopharyngeal swab, presence of viral mutation(s) within the areas targeted by this assay, and inadequate number of viral copies(<138 copies/mL). A negative result must be combined with clinical observations, patient history, and epidemiological information. The expected result is Negative.  Fact Sheet for Patients:  EntrepreneurPulse.com.au  Fact Sheet for Healthcare Providers:  IncredibleEmployment.be  This test is no t yet  approved or cleared by the [place] FDA and  has been authorized for detection and/or diagnosis of SARS-CoV-2 by FDA under an Emergency Use Authorization (EUA). This EUA will remain  in effect (meaning this test can be used) for the duration of the COVID-19 declaration under Section 564(b)(1) of the Act, 21 U.S.C.section 360bbb-3(b)(1), unless the authorization is terminated  or revoked sooner.       Influenza A by PCR NEGATIVE NEGATIVE Final   Influenza B by PCR NEGATIVE NEGATIVE Final    Comment: (NOTE) The Xpert Xpress SARS-CoV-2/FLU/RSV plus assay is intended as an aid in the diagnosis of influenza from Nasopharyngeal swab specimens and should not be used as a sole basis for treatment. Nasal washings and aspirates are unacceptable for Xpert Xpress SARS-CoV-2/FLU/RSV testing.  Fact Sheet for Patients: EntrepreneurPulse.com.au  Fact Sheet for Healthcare Providers: IncredibleEmployment.be  This test is not yet approved or cleared by the [place] FDA and has been authorized for detection and/or diagnosis of SARS-CoV-2 by FDA under an Emergency Use Authorization (EUA). This EUA will remain in effect (meaning this test can be used) for the duration of the COVID-19 declaration under Section 564(b)(1) of the Act, 21 U.S.C. section 360bbb-3(b)(1), unless the authorization is terminated or revoked.  Performed at [facility], [address], [place], [place] [address]   Blood Culture (routine x 2)     Status: None (Preliminary result)   Collection Time: 09/05/21 11:11 AM   Specimen: Left Antecubital; Blood  Result Value Ref Range Status   Specimen Description   Final    LEFT ANTECUBITAL BOTTLES DRAWN AEROBIC AND ANAEROBIC   Special Requests Blood Culture adequate volume  Final   Culture  Final    NO GROWTH 4 DAYS Performed at [facility], [address], [place], [place] [address]    Report Status PENDING  Incomplete        Radiology Studies: DG Ankle Complete Right  Result Date: 09/07/2021 CLINICAL DATA:  Right ankle wound. EXAM: RIGHT ANKLE - COMPLETE 3+ VIEW COMPARISON:  None. FINDINGS: There is no evidence of fracture, dislocation, or joint effusion. There is no evidence of arthropathy or other focal bone abnormality. Soft tissues are unremarkable. IMPRESSION: Negative. Electronically Signed   By: Marijo Conception M.D.   On: 09/07/2021 14:58   DG Foot Complete Right  Result Date: 09/07/2021 CLINICAL DATA:  Right foot wound. EXAM: RIGHT FOOT COMPLETE - 3+ VIEW COMPARISON:  None. FINDINGS: There is no evidence of fracture or dislocation. Extensive osteophyte formation is seen involving the superior portion of the intertarsal and tarsometatarsal joints. Soft tissues are unremarkable. IMPRESSION: Degenerative changes as described above. No acute abnormality is noted. Electronically Signed   By: Marijo Conception M.D.   On: 09/07/2021 15:00     Scheduled Meds:  amoxicillin-clavulanate  1 tablet Oral Q12H   atorvastatin  10 mg Oral Daily   buPROPion  150 mg Oral QPM   Chlorhexidine Gluconate Cloth  6 each Topical Daily   doxycycline  100 mg Oral Q12H   feeding supplement  237 mL Oral BID BM   gabapentin  300 mg Oral QHS   heparin  5,000 Units Subcutaneous Q8H   levothyroxine  150 mcg Oral QAC breakfast   liver oil-zinc oxide   Topical QID   midodrine  15 mg Oral TID WC   mometasone-formoterol  2 puff Inhalation BID   multivitamin with minerals  1 tablet Oral Daily   nutrition supplement (JUVEN)  1 packet Oral BID BM   polyethylene glycol  17 g Oral BID   venlafaxine XR  150 mg Oral QHS   Continuous Infusions:  sodium chloride       LOS: 4 days    Flora Lipps, MD [facility] 09/09/2021, 11:57 AM

## 2021-09-09 NOTE — Progress Notes (Signed)
Pt refused cpap tonight.  Pt was advised that RT is available all night should she change her mind. 

## 2021-09-09 NOTE — Evaluation (Signed)
Physical Therapy Evaluation Patient Details Name: [name] MRN: [id number] DOB: Aug 04, 1955 Today's Date: 09/09/2021  History of Present Illness  66 yo female admitted with R LE cellulitis, fall, adult FTT. Hx of COPD, depression, cellulitis, R LE wound  Clinical Impression  On eval, pt required Min-Mod A for bed mobility. She sat EOB for ~5 minutes with Min guard A. No LOB while sitting EOB with feet unsupported. Assisted pt back to bed at her request-lunch tray arrived and pt preferred to return to bed to eat. Will plan to follow and progress activity as tolerated. PT recommendation is for ST SNF for rehab.       Recommendations for follow up therapy are one component of a multi-disciplinary discharge planning process, led by the attending physician.  Recommendations may be updated based on patient status, additional functional criteria and insurance authorization.  Follow Up Recommendations Skilled nursing-short term rehab (<3 hours/day)    Assistance Recommended at Discharge Frequent or constant Supervision/Assistance  Patient can return home with the following  Two people to help with walking and/or transfers;A lot of help with bathing/dressing/bathroom;Assistance with cooking/housework;Assist for transportation;Direct supervision/assist for medications management;Help with stairs or ramp for entrance;Direct supervision/assist for financial management    Equipment Recommendations  (TBD at next venue)  Recommendations for Other Services  OT consult    Functional Status Assessment Patient has had a recent decline in their functional status and demonstrates the ability to make significant improvements in function in a reasonable and predictable amount of time.     Precautions / Restrictions Precautions Precautions: Fall Precaution Comments: right posterior thigh wound, right foot(plantar heel), buttock wounds Restrictions Weight Bearing Restrictions: No      Mobility  Bed  Mobility Overal bed mobility: Needs Assistance Bed Mobility: Supine to Sit, Sit to Supine     Supine to sit: Min assist, HOB elevated Sit to supine: Mod assist, HOB elevated   General bed mobility comments: patient able to come to EOB wit cues for use of UEs/bedrail to assist. Mod A for LEs back onto bed and controlled descent of trunk.Increased time. Sat EOB for ~5 minutes with Min guard assist-feet unsupported 2* height of bed/air mattress.    Transfers                   General transfer comment: NT on today-pt lunch tray arrived and she preferrred to return to bed to eat    Ambulation/Gait                  Stairs            Wheelchair Mobility    Modified Rankin (Stroke Patients Only)       Balance Overall balance assessment: Needs assistance, History of Falls         Standing balance support: Bilateral upper extremity supported, During functional activity, Reliant on assistive device for balance Standing balance-Leahy Scale: Poor                               Pertinent Vitals/Pain Pain Assessment Pain Assessment: Faces Faces Pain Scale: Hurts even more Pain Location: R thigh Pain Descriptors / Indicators: Grimacing, Tender Pain Intervention(s): Limited activity within patient's tolerance, Monitored during session, Repositioned    Home Living Family/patient expects to be discharged to:: Skilled nursing facility Living Arrangements: Other relatives Available Help at Discharge: Family;Friend(s);Available PRN/intermittently Type of Home: Mobile home Home Access: Stairs to enter  Entrance Stairs-Rails: Can reach both Entrance Stairs-Number of Steps: 4   Home Layout: One level Home Equipment: Rollator (4 wheels);Shower seat;BSC/3in1 Additional Comments: has 3 in 1 over her regular toilet    Prior Function Prior Level of Function : Needs assist       Physical Assist : Mobility (physical) Mobility (physical): Transfers ADLs  (physical): Bathing;Dressing;Toileting;IADLs Mobility Comments: Pt reports using Rollator at home for support 2* arthritic R knee. ADLs Comments: patient has been at her cousin's since Thanksgiving and was not ambulatory. Staying on a love seat and transferring to a 3 N 1, sponge bathing with family assist.     Hand Dominance   Dominant Hand: Right    Extremity/Trunk Assessment   Upper Extremity Assessment Upper Extremity Assessment: Defer to OT evaluation    Lower Extremity Assessment Lower Extremity Assessment: Generalized weakness RLE Deficits / Details: able to raise the leg from the bed, painful per patient, dressings on the heel and posterior thigh RLE: Unable to fully assess due to pain    Cervical / Trunk Assessment Cervical / Trunk Assessment: Normal  Communication   Communication: HOH  Cognition Arousal/Alertness: Awake/alert Behavior During Therapy: WFL for tasks assessed/performed Overall Cognitive Status: Within Functional Limits for tasks assessed                                          General Comments      Exercises     Assessment/Plan    PT Assessment Patient needs continued PT services  PT Problem List Decreased strength;Decreased mobility;Decreased safety awareness;Decreased knowledge of precautions;Decreased range of motion;Decreased activity tolerance;Decreased skin integrity;Decreased balance;Decreased knowledge of use of DME;Pain       PT Treatment Interventions DME instruction;Therapeutic activities;Gait training;Therapeutic exercise;Patient/family education;Functional mobility training;Wheelchair mobility training    PT Goals (Current goals can be found in the Care Plan section)  Acute Rehab PT Goals Patient Stated Goal: i hope i can walk PT Goal Formulation: With patient Time For Goal Achievement: 09/23/21 Potential to Achieve Goals: Fair    Frequency Min 2X/week     Co-evaluation               AM-PAC PT "6  Clicks" Mobility  Outcome Measure Help needed turning from your back to your side while in a flat bed without using bedrails?: A Lot Help needed moving from lying on your back to sitting on the side of a flat bed without using bedrails?: A Lot Help needed moving to and from a bed to a chair (including a wheelchair)?: Total Help needed standing up from a chair using your arms (e.g., wheelchair or bedside chair)?: Total Help needed to walk in hospital room?: Total Help needed climbing 3-5 steps with a railing? : Total 6 Click Score: 8    End of Session   Activity Tolerance: Patient tolerated treatment well;Patient limited by pain Patient left: in bed;with call bell/phone within reach;with bed alarm set   PT Visit Diagnosis: Muscle weakness (generalized) (M62.81);Pain;History of falling (Z91.81) Pain - Right/Left: Right Pain - part of body: Leg    Time: 7673-4193 PT Time Calculation (min) (ACUTE ONLY): 19 min   Charges:   PT Evaluation $PT Eval Moderate Complexity: 1 Mod           Faye Ramsay, PT Acute Rehabilitation  Office: [phone] Pager: [phone]

## 2021-09-10 DIAGNOSIS — D638 Anemia in other chronic diseases classified elsewhere: Secondary | ICD-10-CM | POA: Diagnosis not present

## 2021-09-10 DIAGNOSIS — R627 Adult failure to thrive: Secondary | ICD-10-CM | POA: Diagnosis not present

## 2021-09-10 DIAGNOSIS — L03115 Cellulitis of right lower limb: Secondary | ICD-10-CM | POA: Diagnosis not present

## 2021-09-10 DIAGNOSIS — A419 Sepsis, unspecified organism: Secondary | ICD-10-CM | POA: Diagnosis not present

## 2021-09-10 DIAGNOSIS — E871 Hypo-osmolality and hyponatremia: Secondary | ICD-10-CM | POA: Diagnosis not present

## 2021-09-10 LAB — BASIC METABOLIC PANEL
Anion gap: 5 (ref 5–15)
BUN: 16 mg/dL (ref 8–23)
CO2: 31 mmol/L (ref 22–32)
Calcium: 8.6 mg/dL — ABNORMAL LOW (ref 8.9–10.3)
Chloride: 98 mmol/L (ref 98–111)
Creatinine, Ser: 0.48 mg/dL (ref 0.44–1.00)
GFR, Estimated: >60 mL/min (ref >60)
Glucose, Bld: 96 mg/dL (ref 70–99)
Potassium: 3.9 mmol/L (ref 3.5–5.1)
Sodium: 134 mmol/L — ABNORMAL LOW (ref 135–145)

## 2021-09-10 LAB — CBC
HCT: 30.7 % — ABNORMAL LOW (ref 36.0–46.0)
Hemoglobin: 8.9 g/dL — ABNORMAL LOW (ref 12.0–15.0)
MCH: 25.4 pg — ABNORMAL LOW (ref 26.0–34.0)
MCHC: 29 g/dL — ABNORMAL LOW (ref 30.0–36.0)
MCV: 87.7 fL (ref 80.0–100.0)
Platelets: 499 10*3/uL — ABNORMAL HIGH (ref 150–400)
RBC: 3.5 MIL/uL — ABNORMAL LOW (ref 3.87–5.11)
RDW: 20.9 % — ABNORMAL HIGH (ref 11.5–15.5)
WBC: 9 10*3/uL (ref 4.0–10.5)
nRBC: 0 % (ref 0.0–0.2)

## 2021-09-10 LAB — CULTURE, BLOOD (ROUTINE X 2)
Culture: NO GROWTH
Culture: NO GROWTH
Special Requests: ADEQUATE
Special Requests: ADEQUATE

## 2021-09-10 NOTE — Care Management Important Message (Signed)
Important Message  Patient Details IM Letter given to the Patient Name: [name] MRN: [id number] Date of Birth: April 16, 1956   Medicare Important Message Given:  Yes     Kerin Salen 09/10/2021, 12:23 PM

## 2021-09-10 NOTE — NC FL2 (Signed)
[place] LEVEL OF CARE SCREENING TOOL     IDENTIFICATION  Patient Name: [name] Birthdate: [birth year] Sex: female Admission Date (Current Location): 09/05/2021  [facility] [place] Number:  Herbalist and Address:  [facility],  [place] [address] [place], [place]      Provider Number: [id number]  Attending Physician Name and Address:  Flora Lipps, MD  Relative Name and Phone Number:  Lawernce Keas   [phone]    Current Level of Care: Hospital Recommended Level of Care: [place] Prior Approval Number:    Date Approved/Denied:   PASRR Number: [id number]  Discharge Plan: SNF    Current Diagnoses: Patient Active Problem List   Diagnosis Date Noted   Open leg wound, right, sequela 09/06/2021   Hypokalemia 09/06/2021   Anemia, chronic disease 09/06/2021   Noncompliance 09/06/2021   Hyponatremia 09/06/2021   Leukocytosis 09/06/2021   Edentulous 09/06/2021   Home Infestation by bed bug 09/06/2021   COPD (chronic obstructive pulmonary disease) ([place]) 09/06/2021   Opioid dependence ([place]) 09/06/2021   Chronic hypotension 09/06/2021   Pressure injury of skin 08/17/2021   Sepsis ([place]) 08/15/2021   Septic shock ([place]) 08/15/2021   Cellulitis of right lower extremity    Wound cellulitis 06/16/2021   Hypothyroidism 06/16/2021   HLD (hyperlipidemia) 06/16/2021   Anxiety 06/16/2021   Fall at home, initial encounter 06/16/2021   Adult failure to thrive 06/16/2021   Arm mass, right 03/04/2021   Acute respiratory failure with hypoxia ([place]) 03/04/2021   COPD exacerbation ([place]) 03/01/2021   Acute exacerbation of chronic obstructive pulmonary disease (COPD) ([place]) 02/28/2021   Depression 04/04/2018   Venous insufficiency (chronic) (peripheral) 01/11/2018   BRONCHITIS, ACUTE 10/21/2008   Essential hypertension 07/13/2007   Asthma 07/11/2007   GERD (gastroesophageal reflux disease) 07/11/2007   Sleep apnea  07/11/2007   Shortness of breath 07/11/2007    Orientation RESPIRATION BLADDER Height & Weight     Self, Time, Situation, Place  O2 (2L O2 [place]) Incontinent Weight: 93.4 kg Height:  5\' 5"  (165.1 cm)  BEHAVIORAL SYMPTOMS/MOOD NEUROLOGICAL BOWEL NUTRITION STATUS      Incontinent Diet (Soft)  AMBULATORY STATUS COMMUNICATION OF NEEDS Skin   Extensive Assist Verbally Other (Comment), PU Stage and Appropriate Care (Left and Right Buttocks medial stage 2)   PU Stage 2 Dressing: Daily                   Personal Care Assistance Level of Assistance  Bathing, Feeding, Dressing Bathing Assistance: Maximum assistance Feeding assistance: Independent Dressing Assistance: Maximum assistance     Functional Limitations Info  Sight, Hearing, Speech Sight Info: Adequate Hearing Info: Impaired Speech Info: Adequate    SPECIAL CARE FACTORS FREQUENCY  PT (By licensed PT), OT (By licensed OT)     PT Frequency: x5 week OT Frequency: x5 week            Contractures Contractures Info: Not present    Additional Factors Info  Code Status, Allergies, Psychotropic, Isolation Precautions Code Status Info: DNR Allergies Info: Gemfibrozil, Nitroglycerin, Other, Pravastatin, Sulfamethoxazole-trimethoprim, Tyloxapol Psychotropic Info: buPROPion (WELLBUTRIN XL) 24 hr tablet 150 mg,venlafaxine XR (EFFEXOR-XR) 24 hr capsule 150 mg   Isolation Precautions Info: History of MRSA, 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     Current Medications (09/10/2021):  This is the current hospital active medication list Current Facility-Administered Medications  Medication Dose Route Frequency Provider Last Rate Last Admin   0.9 %  sodium chloride infusion  250 mL Intravenous Continuous Emokpae, Ejiroghene E, MD       acetaminophen (TYLENOL) tablet 650 mg  650 mg Oral Q6H PRN Emokpae, Ejiroghene E, MD   650 mg at 09/09/21 1716   Or   acetaminophen (TYLENOL) suppository 650 mg  650 mg Rectal Q6H PRN Emokpae, Ejiroghene E, MD        ALPRAZolam (XANAX) tablet 1 mg  1 mg Oral TID PRN Wynetta Emery, Clanford L, MD   1 mg at 09/10/21 0958   amoxicillin-clavulanate (AUGMENTIN) 875-125 MG per tablet 1 tablet  1 tablet Oral Q12H Laurice Record, MD   1 tablet at 09/10/21 0958   atorvastatin (LIPITOR) tablet 10 mg  10 mg Oral Daily Emokpae, Ejiroghene E, MD   10 mg at 09/10/21 0958   buPROPion (WELLBUTRIN XL) 24 hr tablet 150 mg  150 mg Oral QPM Johnson, Clanford L, MD   150 mg at 09/09/21 1716   doxycycline (VIBRA-TABS) tablet 100 mg  100 mg Oral Q12H Laurice Record, MD   100 mg at 09/10/21 0958   feeding supplement (ENSURE ENLIVE / ENSURE PLUS) liquid 237 mL  237 mL Oral BID BM Pokhrel, Laxman, MD   237 mL at 09/10/21 1051   gabapentin (NEURONTIN) capsule 300 mg  300 mg Oral QHS Johnson, Clanford L, MD   300 mg at 09/09/21 2155   heparin injection 5,000 Units  5,000 Units Subcutaneous Q8H Emokpae, Ejiroghene E, MD   5,000 Units at 09/10/21 [id number]   ipratropium-albuterol (DUONEB) 0.5-2.5 (3) MG/3ML nebulizer solution 3 mL  3 mL Nebulization Q4H PRN Emokpae, Ejiroghene E, MD       levothyroxine (SYNTHROID) tablet 150 mcg  150 mcg Oral QAC breakfast Emokpae, Ejiroghene E, MD   150 mcg at 09/10/21 [id number]   liver oil-zinc oxide (DESITIN) 40 % ointment   Topical QID Emokpae, Ejiroghene E, MD   Given at 09/10/21 1000   magic mouthwash w/lidocaine  5 mL Oral TID PRN Wynetta Emery, Clanford L, MD   5 mL at 09/10/21 0958   midodrine (PROAMATINE) tablet 15 mg  15 mg Oral TID WC Emokpae, Ejiroghene E, MD   15 mg at 09/10/21 0958   mometasone-formoterol (DULERA) 200-5 MCG/ACT inhaler 2 puff  2 puff Inhalation BID Wynetta Emery, Clanford L, MD   2 puff at 09/10/21 0921   morphine (PF) 2 MG/ML injection 2 mg  2 mg Intravenous Q4H PRN Emokpae, Ejiroghene E, MD   2 mg at 09/10/21 [id number]   multivitamin with minerals tablet 1 tablet  1 tablet Oral Daily Pokhrel, Laxman, MD   1 tablet at 09/09/21 1716   nutrition supplement (JUVEN) (JUVEN) powder packet 1 packet  1 packet Oral  BID BM Pokhrel, Laxman, MD   1 packet at 09/10/21 0958   ondansetron (ZOFRAN) tablet 4 mg  4 mg Oral Q6H PRN Emokpae, Ejiroghene E, MD   4 mg at 09/09/21 1716   Or   ondansetron (ZOFRAN) injection 4 mg  4 mg Intravenous Q6H PRN Emokpae, Ejiroghene E, MD   4 mg at 09/10/21 0958   polyethylene glycol (MIRALAX / GLYCOLAX) packet 17 g  17 g Oral Daily PRN Emokpae, Ejiroghene E, MD       polyethylene glycol (MIRALAX / GLYCOLAX) packet 17 g  17 g Oral BID Johnson, Clanford L, MD   17 g at 09/07/21 2214   venlafaxine XR (EFFEXOR-XR) 24 hr capsule 150 mg  150 mg Oral QHS Johnson, Clanford L, MD   150 mg at 09/09/21 2154  Discharge Medications: Please see discharge summary for a list of discharge medications.  Relevant Imaging Results:  Relevant Lab Results:   Additional Information [id number]  Purcell Mouton, RN

## 2021-09-10 NOTE — Evaluation (Signed)
Occupational Therapy Evaluation Patient Details Name: [name] MRN: [id number] DOB: [birth year] Today's Date: 09/10/2021   History of Present Illness 66 yo female admitted with R LE cellulitis, fall, adult FTT. Hx of COPD, depression, cellulitis, R LE wound   Clinical Impression   Patient is familiar to this OT from most recent admission. When asked what happened at her goddaughter's house patient states "I was sick, I had to come back." Patient is now agreeable to go to rehab facility in order to be more independent with mobility, transfers and self care. Patient was supervision for bed mobility and min +2 for chair transfer. Patient demonstrates ability to lift buttock from edge of bed without assist, needing the min A +2 to stabilize in standing due to weak lower extremities. Patient needing total A for pericare and LB dressing due to limited standing balance and tolerance. Acute OT to follow to facilitate D/C to venue listed below.      Recommendations for follow up therapy are one component of a multi-disciplinary discharge planning process, led by the attending physician.  Recommendations may be updated based on patient status, additional functional criteria and insurance authorization.   Follow Up Recommendations  Skilled nursing-short term rehab (<3 hours/day)    Assistance Recommended at Discharge Frequent or constant Supervision/Assistance  Patient can return home with the following A lot of help with walking and/or transfers;A lot of help with bathing/dressing/bathroom;Assistance with cooking/housework;Direct supervision/assist for medications management;Direct supervision/assist for financial management;Assist for transportation;Help with stairs or ramp for entrance    Functional Status Assessment  Patient has had a recent decline in their functional status and/or demonstrates limited ability to make significant improvements in function in a reasonable and predictable amount of  time  Equipment Recommendations  None recommended by OT       Precautions / Restrictions Precautions Precautions: Fall Precaution Comments: right posterior thigh wound, right foot(plantar heel), buttock wounds      Mobility Bed Mobility Overal bed mobility: Needs Assistance Bed Mobility: Supine to Sit     Supine to sit: Supervision, HOB elevated     General bed mobility comments: Patient self able to sit up EOB with HOB elevated and bed rail use        Balance Overall balance assessment: Needs assistance, History of Falls Sitting-balance support: Feet supported Sitting balance-Leahy Scale: Fair     Standing balance support: Bilateral upper extremity supported, Reliant on assistive device for balance Standing balance-Leahy Scale: Poor                             ADL either performed or assessed with clinical judgement   ADL Overall ADL's : Needs assistance/impaired Eating/Feeding: Independent;Bed level;Sitting   Grooming: Set up;Sitting   Upper Body Bathing: Set up;Sitting   Lower Body Bathing: Maximal assistance;Sitting/lateral leans;Sit to/from stand;Bed level   Upper Body Dressing : Set up;Sitting   Lower Body Dressing: Total assistance;Sitting/lateral leans;Sit to/from stand;Bed level   Toilet Transfer: Minimal assistance;+2 for safety/equipment;+2 for physical assistance;Rolling walker (2 wheels) Toilet Transfer Details (indicate cue type and reason): Patient initiates powering up to standing before OT ready. Patient min +2 to take side steps over to recliner and cues to reach back for chair arm when sitting. Toileting- Clothing Manipulation and Hygiene: Total assistance;Sit to/from stand;Sitting/lateral lean Toileting - Clothing Manipulation Details (indicate cue type and reason): reliant on UE support in standing     Functional mobility during ADLs: Minimal assistance;+2  for physical assistance;+2 for safety/equipment;Rolling walker (2  wheels);Cueing for safety;Cueing for sequencing        Pertinent Vitals/Pain Pain Assessment Pain Assessment: Faces Faces Pain Scale: Hurts a little bit Pain Location: R thigh Pain Descriptors / Indicators: Tender Pain Intervention(s): Premedicated before session     Hand Dominance Right   Extremity/Trunk Assessment Upper Extremity Assessment Upper Extremity Assessment: Generalized weakness   Lower Extremity Assessment Lower Extremity Assessment: Defer to PT evaluation       Communication Communication Communication: HOH   Cognition Arousal/Alertness: Awake/alert Behavior During Therapy: WFL for tasks assessed/performed Overall Cognitive Status: Within Functional Limits for tasks assessed                                                  Home Living Family/patient expects to be discharged to:: Skilled nursing facility                                        Prior Functioning/Environment Prior Level of Function : Needs assist       Physical Assist : Mobility (physical) Mobility (physical): Transfers ADLs (physical): Bathing;Dressing;Toileting;IADLs            OT Problem List: Decreased strength;Decreased activity tolerance;Impaired balance (sitting and/or standing);Pain;Decreased safety awareness      OT Treatment/Interventions: Self-care/ADL training;Therapeutic exercise;Balance training;Patient/family education;Therapeutic activities;Energy conservation    OT Goals(Current goals can be found in the care plan section) Acute Rehab OT Goals Patient Stated Goal: go to rehab OT Goal Formulation: With patient Time For Goal Achievement: 09/24/21 Potential to Achieve Goals: Good  OT Frequency: Min 2X/week       AM-PAC OT "6 Clicks" Daily Activity     Outcome Measure Help from another person eating meals?: None Help from another person taking care of personal grooming?: A Little Help from another person toileting, which  includes using toliet, bedpan, or urinal?: A Lot Help from another person bathing (including washing, rinsing, drying)?: A Lot Help from another person to put on and taking off regular upper body clothing?: A Little Help from another person to put on and taking off regular lower body clothing?: Total 6 Click Score: 15   End of Session Equipment Utilized During Treatment: Rolling walker (2 wheels);Gait belt Nurse Communication: Mobility status  Activity Tolerance: Patient tolerated treatment well Patient left: in chair;with call bell/phone within reach;with chair alarm set  OT Visit Diagnosis: Other abnormalities of gait and mobility (R26.89);History of falling (Z91.81);Muscle weakness (generalized) (M62.81)                Time: 2202-5427 OT Time Calculation (min): 18 min Charges:  OT General Charges $OT Visit: 1 Visit OT Evaluation $OT Eval Low Complexity: 1 Low  Marlyce Huge OT OT pager: [phone]  Carmelia Roller 09/10/2021, 2:30 PM

## 2021-09-10 NOTE — TOC Progression Note (Signed)
Transition of Care [facility]) - Progression Note    Patient Details  Name: [name] MRN: [id number] Date of Birth: 09-04-1955  Transition of [facility]) CM/SW Contact  Geni Bers, RN Phone Number: 09/10/2021, 1:38 PM  Clinical Narrative:     Spoke with pt concerning SNF/Rehab. Fax FL2 to SNF/Rehab.   Expected Discharge Plan: Home w Home Health Services Barriers to Discharge: Continued Medical Work up  Expected Discharge Plan and Services Expected Discharge Plan: Home w Home Health Services   Discharge Planning Services: CM Consult Post Acute Care Choice: Resumption of Svcs/PTA Provider Living arrangements for the past 2 months: Single Family Home                                       Social Determinants of Health (SDOH) Interventions    Readmission Risk Interventions Readmission Risk Prevention Plan 09/08/2021  Transportation Screening Complete  Medication Review [facility]) Complete  PCP or Specialist appointment within 3-5 days of discharge Complete  HRI or Home Care Consult Complete  SW Recovery Care/Counseling Consult Complete  Palliative Care Screening Not Applicable  Skilled Nursing Facility Complete  Some recent data might be hidden

## 2021-09-10 NOTE — Progress Notes (Signed)
PROGRESS NOTE    [name]  [id number] DOB: 10/16/1955 DOA: 09/05/2021 PCP: Crist Infante, MD    Brief Narrative:  [name] is a 66 y.o. female with medical history significant for COPD, depression, hypertension, hypothyroidism, who presented to the emergency department with feeling of not feeling well.  Of note patient was recently discharged from [facility] long hospital after being treated for septic shock secondary to right lower extremity cellulitis and had completed 10-day course of antibiotic.  General surgery did not recommend any surgical intervention.  Patient also required midodrine and was taken off Flor[place].  After discharge from the hospital, patient continued to report worsening pain in the leg.  This is her third hospitalization for the same problem.  On previous admission, patient had refused skilled nursing facility placement.  In the ED, temperature was 101.6 F, was tachycardic and tachypneic.  Blood pressure was low which improved with 2 L of IV fluid bolus and patient was given IV Levophed.  Femur x-ray showed large defect in the soft tissue with soft tissue edema.  Patient was started on IV vancomycin cefepime metronidazole and was admitted to hospital for further evaluation and treatment.  Infectious disease was consulted during hospitalization for antibiotic recommendation. .   Assessment and Plan: * Cellulitis of right lower extremity- (present on admission) CT scan of the extremity did not show evidence of abscess or deep infection.  General surgery reviewed and did not recommend any debridement.  Continue wound care and antibiotics.  It looks like patient is unable to manage the wounds at home.  Patient had initially refused skilled nursing facility in the previous admission.  Patient will require skilled nursing facility placement at this time.  ID on board for antibiotic management.  Currently on oral antibiotics doxycycline and Augmentin.  Afebrile at this time.  No  leukocytosis.  Blood cultures negative in 4 days.  Septic shock ([place])- (present on admission) Resolved at this time.  Patient was initially on Levophed which has been discontinued.  Currently on oral antibiotic.  Chronic hypotension- (present on admission) Patient was initiated on midodrine on last admission.  Continue to hold antihypertensives. Marland Kitchen  Opioid dependence ([place])- (present on admission) Morphine especially during dressing changes.  COPD (chronic obstructive pulmonary disease) ([place])- (present on admission) Continue bronchodilators, oxygen.  On nasal cannula 2 L/min.  Home Infestation by bed bug- (present on admission) TOC on board for safe disposition.  Edentulous- (present on admission) We will put the patient on dysphagia 3 soft diet for now.  Leukocytosis- (present on admission) Resolved at this time.  Hyponatremia- (present on admission) Latest sodium of 134.  Received IV hydration.  Noncompliance Third admission for the same.  Unable to care for self at home.  Will need placement.  Anemia, chronic disease- (present on admission) Latest hemoglobin of 8.9 from 8.2-6.3.  Received 1 unit of packed RBC during hospitalization  Hypokalemia- (present on admission) Mild.  We will continue to replace orally.  Check levels in a.m.  Open leg wound, right, sequela No need for surgical debridement as per surgery.  Wound care on board.  Continue wound care.   Pressure injury of skin- (present on admission) - Continue air mattress overlay at all times  Adult failure to thrive- (present on admission) poor social situation in setting of chronic open right leg wound, difficult to heal, recurrent skin infections and prolonged hospitalizations.  Encourage oral nutrition.  Anxiety- (present on admission) Continue Xanax for now.  HLD (hyperlipidemia)- (present  on admission) Continue atorvastatin  Hypothyroidism- (present on admission) Continue Synthroid  Depression-  (present on admission) Continue Effexor and Wellbutrin  Sleep apnea- (present on admission) Continue nightly CPAP but patient has been intermittently refusing.  GERD (gastroesophageal reflux disease)- (present on admission) Continue Protonix.  Essential hypertension- (present on admission) Antihypertensives are on hold due to hypotension.  On midodrine.    DVT prophylaxis: heparin injection 5,000 Units Start: 09/05/21 2200   Code Status:     Code Status: DNR  Disposition: Skilled nursing facility.  Communicated with TOC.  Status is: Inpatient Remains inpatient appropriate because: Need for rehabilitation, antibiotics, wound care,   Family Communication:  I spoke with the patient's friend listed in the computer. Ms Archie Patten states that the patient is the god mother.  She is extremely worried about her inability to do things at home.  Consultants:  Infectious disease General surgery  Procedures:  Wound care  Antimicrobials:  Doxycycline and amoxicillin  Anti-infectives (From admission, onward)    Start     Dose/Rate Route Frequency Ordered Stop   09/08/21 2200  doxycycline (VIBRA-TABS) tablet 100 mg        100 mg Oral Every 12 hours 09/08/21 1317 09/15/21 0959   09/08/21 2200  amoxicillin-clavulanate (AUGMENTIN) 875-125 MG per tablet 1 tablet        1 tablet Oral Every 12 hours 09/08/21 1317 09/15/21 0959   09/07/21 1230  Ampicillin-Sulbactam (UNASYN) 3 g in sodium chloride 0.9 % 100 mL IVPB  Status:  Discontinued        3 g 200 mL/hr over 30 Minutes Intravenous Every 6 hours 09/07/21 1122 09/08/21 1317   09/06/21 0000  vancomycin (VANCOCIN) IVPB 1000 mg/200 mL premix  Status:  Discontinued        1,000 mg 200 mL/hr over 60 Minutes Intravenous Every 12 hours 09/05/21 1239 09/08/21 1317   09/05/21 2300  metroNIDAZOLE (FLAGYL) IVPB 500 mg  Status:  Discontinued        500 mg 100 mL/hr over 60 Minutes Intravenous Every 12 hours 09/05/21 1947 09/07/21 1122   09/05/21 2000   ceFEPIme (MAXIPIME) 2 g in sodium chloride 0.9 % 100 mL IVPB  Status:  Discontinued        2 g 200 mL/hr over 30 Minutes Intravenous Every 8 hours 09/05/21 1239 09/07/21 1122   09/05/21 1200  vancomycin (VANCOREADY) IVPB 2000 mg/400 mL        2,000 mg 200 mL/hr over 120 Minutes Intravenous  Once 09/05/21 1106 09/05/21 1408   09/05/21 1100  ceFEPIme (MAXIPIME) 2 g in sodium chloride 0.9 % 100 mL IVPB        2 g 200 mL/hr over 30 Minutes Intravenous  Once 09/05/21 1050 09/05/21 1148   09/05/21 1100  metroNIDAZOLE (FLAGYL) IVPB 500 mg        500 mg 100 mL/hr over 60 Minutes Intravenous  Once 09/05/21 1050 09/05/21 1222   09/05/21 1100  vancomycin (VANCOCIN) IVPB 1000 mg/200 mL premix  Status:  Discontinued        1,000 mg 200 mL/hr over 60 Minutes Intravenous  Once 09/05/21 1050 09/05/21 1106      Subjective: Today, patient was seen and examined at bedside.  Patient states that she feels a little nauseated with some pain.  Denies any vomiting.  Objective: Vitals:   09/10/21 0448 09/10/21 0530 09/10/21 0600 09/10/21 0921  BP: 101/62     Pulse: 78     Resp: 16 14 12  Temp: 98.5 F (36.9 C)     TempSrc: Oral     SpO2: 94%   96%  Weight:      Height:        Intake/Output Summary (Last 24 hours) at 09/10/2021 1242 Last data filed at 09/10/2021 0440 Gross per 24 hour  Intake --  Output 2800 ml  Net -2800 ml   Filed Weights   09/05/21 1038 09/05/21 1857  Weight: 94.3 kg 93.4 kg    Physical Examination: General:  Average built, not in obvious distress HENT:   No scleral pallor or icterus noted. Oral mucosa is moist.  Chest:  Clear breath sounds.  Diminished breath sounds bilaterally. No crackles or wheezes.  CVS: S1 &S2 heard. No murmur.  Regular rate and rhythm. Abdomen: Soft, nontender, nondistended.  Bowel sounds are heard.   Extremities: No cyanosis, clubbing.  Peripheral pulses are palpable.  Right upper thigh with wound dressing, right heel wound. Psych: Alert, awake and  communicative, anxious mood. CNS:  No cranial nerve deficits.  Generalized weakness noted, able to move upper extremities. Skin: Warm and dry.  Right thigh and right heel wound.  Data Reviewed:   CBC: Recent Labs  Lab 09/05/21 1041 09/06/21 0554 09/07/21 0359 09/07/21 1609 09/08/21 0433 09/09/21 0408 09/10/21 0345  WBC 13.8*   < > 7.0 9.2 6.4 8.9 9.0  NEUTROABS 9.9*  --  3.8  --  2.6 3.6  --   HGB 8.3*   < > 6.3* 8.7* 8.2* 8.8* 8.9*  HCT 28.0*   < > 21.6* 29.6* 27.7* 30.3* 30.7*  MCV 85.9   < > 86.4 86.8 86.8 89.1 87.7  PLT 460*   < > 331 414* 378 441* 499*   < > = values in this interval not displayed.    Basic Metabolic Panel: Recent Labs  Lab 09/04/21 0635 09/04/21 [id number] 09/05/21 1041 09/06/21 0554 09/07/21 0359 09/08/21 0433 09/09/21 0408 09/10/21 0345  NA  --    < > 128* 137 138 139 136 134*  K 4.0  --  4.0 3.4* 3.2* 3.4* 4.1 3.9  CL  --    < > 92* 101 103 101 99 98  CO2  --    < > 27 28 30  32 32 31  GLUCOSE  --    < > 115* 169* 82 84 99 96  BUN  --    < > 13 9 8 12 17 16   CREATININE  --    < > 0.58 0.44 0.40* 0.46 0.51 0.48  CALCIUM  --    < > 8.1* 8.1* 7.7* 8.2* 8.8* 8.6*  MG 2.1  --  2.0  --  2.0 2.0 2.1  --    < > = values in this interval not displayed.    Liver Function Tests: Recent Labs  Lab 09/05/21 1041 09/07/21 0359 09/08/21 0433 09/09/21 0408  AST 25 13* 14* 15  ALT 26 18 17 15   ALKPHOS 119 75 79 78  BILITOT 0.5 0.3 0.3 0.3  PROT 6.0* 4.8* 5.3* 5.4*  ALBUMIN 2.7* 2.1* 2.3* 2.4*     Radiology Studies: No results found.    LOS: 5 days    Flora Lipps, MD [facility] 09/10/2021, 12:42 PM

## 2021-09-11 DIAGNOSIS — L03115 Cellulitis of right lower limb: Secondary | ICD-10-CM | POA: Diagnosis not present

## 2021-09-11 DIAGNOSIS — E871 Hypo-osmolality and hyponatremia: Secondary | ICD-10-CM | POA: Diagnosis not present

## 2021-09-11 DIAGNOSIS — R627 Adult failure to thrive: Secondary | ICD-10-CM | POA: Diagnosis not present

## 2021-09-11 DIAGNOSIS — A419 Sepsis, unspecified organism: Secondary | ICD-10-CM | POA: Diagnosis not present

## 2021-09-11 DIAGNOSIS — D638 Anemia in other chronic diseases classified elsewhere: Secondary | ICD-10-CM | POA: Diagnosis not present

## 2021-09-11 LAB — MAGNESIUM: Magnesium: 2.1 mg/dL (ref 1.7–2.4)

## 2021-09-11 LAB — BASIC METABOLIC PANEL
Anion gap: 7 (ref 5–15)
BUN: 14 mg/dL (ref 8–23)
CO2: 28 mmol/L (ref 22–32)
Calcium: 8.3 mg/dL — ABNORMAL LOW (ref 8.9–10.3)
Chloride: 98 mmol/L (ref 98–111)
Creatinine, Ser: 0.59 mg/dL (ref 0.44–1.00)
GFR, Estimated: >60 mL/min (ref >60)
Glucose, Bld: 90 mg/dL (ref 70–99)
Potassium: 3.9 mmol/L (ref 3.5–5.1)
Sodium: 133 mmol/L — ABNORMAL LOW (ref 135–145)

## 2021-09-11 LAB — CBC
HCT: 30.5 % — ABNORMAL LOW (ref 36.0–46.0)
Hemoglobin: 8.8 g/dL — ABNORMAL LOW (ref 12.0–15.0)
MCH: 25.7 pg — ABNORMAL LOW (ref 26.0–34.0)
MCHC: 28.9 g/dL — ABNORMAL LOW (ref 30.0–36.0)
MCV: 88.9 fL (ref 80.0–100.0)
Platelets: 512 10*3/uL — ABNORMAL HIGH (ref 150–400)
RBC: 3.43 MIL/uL — ABNORMAL LOW (ref 3.87–5.11)
RDW: 21 % — ABNORMAL HIGH (ref 11.5–15.5)
WBC: 10.1 10*3/uL (ref 4.0–10.5)
nRBC: 0 % (ref 0.0–0.2)

## 2021-09-11 MED ORDER — OXYCODONE-ACETAMINOPHEN 5-325 MG PO TABS
1.0000 | ORAL_TABLET | Freq: Four times a day (QID) | ORAL | Status: DC | PRN
  Administered 2021-09-11 – 2021-09-15 (×9): 2 via ORAL
  Filled 2021-09-11 (×9): qty 2

## 2021-09-11 MED ORDER — HYDROCORTISONE 0.5 % EX CREA
TOPICAL_CREAM | Freq: Three times a day (TID) | CUTANEOUS | Status: DC | PRN
  Filled 2021-09-11: qty 28.35

## 2021-09-11 NOTE — Progress Notes (Signed)
Physical Therapy Treatment Patient Details Name: [name] MRN: [id number] DOB: Apr 30, 1956 Today's Date: 09/11/2021   History of Present Illness 66 yo female admitted with R LE cellulitis, fall, adult FTT. This is third hospitalization for this Dx.  Hx of COPD, depression, cellulitis, R LE wound.    PT Comments    Pt sat at edge of bed for ~6 minutes, then stood at edge of bed with RW for ~20 seconds for linen change as her bedpads were saturated in urine. Mod assist for sit to supine, +2 total assist for scooting up in bed. 10/10 posterior R thigh pain limits activity tolerance. Pain medication requested and administered during PT session.    Recommendations for follow up therapy are one component of a multi-disciplinary discharge planning process, led by the attending physician.  Recommendations may be updated based on patient status, additional functional criteria and insurance authorization.  Follow Up Recommendations  Skilled nursing-short term rehab (<3 hours/day)     Assistance Recommended at Discharge    Patient can return home with the following Two people to help with walking and/or transfers;A lot of help with bathing/dressing/bathroom;Assistance with cooking/housework;Assist for transportation;Direct supervision/assist for medications management;Help with stairs or ramp for entrance;Direct supervision/assist for financial management   Equipment Recommendations       Recommendations for Other Services       Precautions / Restrictions Precautions Precautions: Fall Precaution Comments: right posterior thigh wound, right foot(plantar heel), buttock wounds Other Brace: Prevalon boots Restrictions Weight Bearing Restrictions: No     Mobility  Bed Mobility Overal bed mobility: Needs Assistance Bed Mobility: Supine to Sit, Sit to Supine     Supine to sit: Supervision, HOB elevated Sit to supine: Mod assist   General bed mobility comments: used bedrails and HOB up  for supine to sit, assist for LEs into bed and then +2 total for scooting up in bed. R posterior thigh pain limiting activity tolerance.    Transfers Overall transfer level: Needs assistance Equipment used: Rolling walker (2 wheels) Transfers: Sit to/from Stand Sit to Stand: Min assist           General transfer comment: pt stood at edge of bed for ~20 seconds for linen change    Ambulation/Gait                   Stairs             Wheelchair Mobility    Modified Rankin (Stroke Patients Only)       Balance Overall balance assessment: Needs assistance, History of Falls Sitting-balance support: Feet supported Sitting balance-Leahy Scale: Fair     Standing balance support: Bilateral upper extremity supported, Reliant on assistive device for balance Standing balance-Leahy Scale: Poor                              Cognition Arousal/Alertness: Awake/alert Behavior During Therapy: WFL for tasks assessed/performed Overall Cognitive Status: Within Functional Limits for tasks assessed                                          Exercises      General Comments        Pertinent Vitals/Pain Pain Assessment Pain Score: 10-Worst pain ever Pain Location: R posterior  thigh Pain Descriptors / Indicators: Grimacing, Guarding, Moaning Pain Intervention(s): Limited  activity within patient's tolerance, Monitored during session, Patient requesting pain meds-RN notified, RN gave pain meds during session    Home Living                          Prior Function            PT Goals (current goals can now be found in the care plan section) Acute Rehab PT Goals Patient Stated Goal: i hope i can walk PT Goal Formulation: With patient Time For Goal Achievement: 09/18/21 Potential to Achieve Goals: Fair Progress towards PT goals: Progressing toward goals    Frequency    Min 2X/week      PT Plan Current plan remains  appropriate    Co-evaluation              AM-PAC PT "6 Clicks" Mobility   Outcome Measure  Help needed turning from your back to your side while in a flat bed without using bedrails?: A Lot Help needed moving from lying on your back to sitting on the side of a flat bed without using bedrails?: A Lot Help needed moving to and from a bed to a chair (including a wheelchair)?: Total Help needed standing up from a chair using your arms (e.g., wheelchair or bedside chair)?: Total Help needed to walk in hospital room?: Total Help needed climbing 3-5 steps with a railing? : Total 6 Click Score: 8    End of Session Equipment Utilized During Treatment: Gait belt Activity Tolerance: Patient limited by pain Patient left: in bed;with call bell/phone within reach;with nursing/sitter in room Nurse Communication: Mobility status;Patient requests pain meds PT Visit Diagnosis: Muscle weakness (generalized) (M62.81);Pain;History of falling (Z91.81);Difficulty in walking, not elsewhere classified (R26.2) Pain - Right/Left: Right Pain - part of body: Leg     Time: [id number] PT Time Calculation (min) (ACUTE ONLY): 21 min  Charges:  $Therapeutic Activity: 8-22 mins                    Blondell Reveal Kistler PT 09/11/2021  Acute Rehabilitation Services Pager [phone] Office [phone]

## 2021-09-11 NOTE — Progress Notes (Signed)
Pt requesting IV Morphine Pain Medication. BP checked with results of 94/50 Pulse 87. Parameters to hold IV Morphine for BP <100. MD sent secure chat to update and await response.

## 2021-09-11 NOTE — Progress Notes (Signed)
PROGRESS NOTE    [name]  [id number] DOB: [birth year] DOA: 09/05/2021 PCP: Connie Infante, MD    Brief Narrative:  [name] is a 66 y.o. female with medical history significant for COPD, depression, hypertension, hypothyroidism, who presented to the emergency department with feeling of not feeling well.  Of note patient was recently discharged from [facility] long hospital after being treated for septic shock secondary to right lower extremity cellulitis and had completed 10-day course of antibiotic.  General surgery did not recommend any surgical intervention.  Patient also required midodrine and was taken off Florinef.  After discharge from the hospital, patient continued to report worsening pain in the leg.  This is her third hospitalization for the same problem.  On previous admission, patient had refused skilled nursing facility placement.  In the ED, temperature was 101.6 F, was tachycardic and tachypneic.  Blood pressure was low which improved with 2 L of IV fluid bolus and patient was given IV Levophed.  Femur x-ray showed large defect in the soft tissue with soft tissue edema.  Patient was started on IV vancomycin cefepime metronidazole and was admitted to hospital for further evaluation and treatment.  Infectious disease was consulted during hospitalization for antibiotic recommendation.  At this time, patient is stable for disposition to skilled nursing facility. .   Assessment and Plan: * Cellulitis of right lower extremity- (present on admission) CT scan of the extremity did not show evidence of abscess or deep infection.  General surgery reviewed and did not recommend any debridement.  Continue wound care and antibiotics.  It looks like patient is unable to manage the wounds at home.    ID was consulted to recommend doxycycline and Augmentin until 09/14/2021.  Afebrile at this time.  No leukocytosis.  Blood cultures negative.  Septic shock ([place])- (present on admission) Resolved at  this time.  Patient was initially on Levophed which has been discontinued.  Currently on oral antibiotic.  Chronic hypotension- (present on admission) Patient was initiated on midodrine on last admission.  Continue to hold antihypertensives. Marland Kitchen  Opioid dependence ([place])- (present on admission) Morphine especially during dressing changes.  COPD (chronic obstructive pulmonary disease) ([place])- (present on admission) Continue bronchodilators, oxygen.  On nasal cannula 2 L/min.  Appears compensated at this time  Home Infestation by bed bug- (present on admission) TOC on board for safe disposition.  Edentulous- (present on admission)  dysphagia 3 soft diet for now.  Leukocytosis- (present on admission) Resolved at this time.  Hyponatremia- (present on admission) Mild.  Latest sodium of 133.  Received IV hydration.  Noncompliance Third admission for the same.  Unable to care for self at home.  Will need placement.  Anemia, chronic disease- (present on admission) Latest hemoglobin of 8.8 from8.9< 8.2 <6.3.  Received 1 unit of packed RBC during hospitalization  Hypokalemia- (present on admission) Improved after replacement.  Open leg wound, right, sequela No need for surgical debridement as per surgery.  Wound care on board.  Continue wound care.   Pressure injury of skin- (present on admission) - Continue air mattress overlay at all times  Adult failure to thrive- (present on admission) poor social situation in setting of chronic open right leg wound, difficult to heal, recurrent skin infections and prolonged hospitalizations.  Encourage oral nutrition.  Anxiety- (present on admission) Continue Xanax for now.  HLD (hyperlipidemia)- (present on admission) Continue atorvastatin  Hypothyroidism- (present on admission) Continue Synthroid  Depression- (present on admission) Continue Effexor and Wellbutrin  Sleep apnea- (present on admission) Continue nightly CPAP but patient  has been intermittently refusing.  GERD (gastroesophageal reflux disease)- (present on admission) Continue Protonix.  Essential hypertension- (present on admission) Antihypertensives are on hold due to hypotension.  On midodrine.    DVT prophylaxis: heparin injection 5,000 Units Start: 09/05/21 2200   Code Status:     Code Status: DNR  Disposition: Skilled nursing facility.   Status is: Inpatient Remains inpatient appropriate because: Need for rehabilitation, antibiotics, wound care,   Family Communication:   I spoke with the patient's friend listed in the computer on 09/10/2021. Ms Kenney Houseman states that the patient is the god mother.  She is extremely worried about her inability to do things at home.  Consultants:  Infectious disease General surgery  Procedures:  Wound care  Antimicrobials:  Doxycycline and amoxicillin until 2/13  Subjective: Today, patient was seen and examined at bedside.  Complains of mild pain but no nausea vomiting fever chills or rigor.    Objective: Vitals:   09/11/21 0433 09/11/21 0800 09/11/21 0822 09/11/21 1222  BP: (!) 99/51 (!) 94/50  (!) 117/53  Pulse: 89 87 76 78  Resp: 18 18 18 12   Temp: 98.5 F (36.9 C)   98.4 F (36.9 C)  TempSrc: Oral   Oral  SpO2: 96%  96% 99%  Weight:      Height:        Intake/Output Summary (Last 24 hours) at 09/11/2021 1342 Last data filed at 09/11/2021 1050 Gross per 24 hour  Intake 1320 ml  Output 2850 ml  Net -1530 ml    Filed Weights   09/05/21 1038 09/05/21 1857  Weight: 94.3 kg 93.4 kg   Body mass index is 34.28 kg/m.   Physical Examination: General:  Obese built, not in obvious distress, slow to speech, HENT:   No scleral pallor or icterus noted. Oral mucosa is moist.  Chest:  Clear breath sounds.  Diminished breath sounds bilaterally. No crackles or wheezes.  CVS: S1 &S2 heard. No murmur.  Regular rate and rhythm. Abdomen: Soft, nontender, nondistended.  Bowel sounds are heard.    Extremities: No cyanosis, clubbing or edema.  Peripheral pulses are palpable.  Right upper thigh wound with dressing, right heel wound Psych: Alert, awake and oriented, normal mood CNS:  No cranial nerve deficits.  Generalized weakness noted. Skin: Warm and dry.  Right thigh and heel wound  Data Reviewed:   CBC: Recent Labs  Lab 09/05/21 1041 09/06/21 0554 09/07/21 0359 09/07/21 1609 09/08/21 0433 09/09/21 0408 09/10/21 0345 09/11/21 0356  WBC 13.8*   < > 7.0 9.2 6.4 8.9 9.0 10.1  NEUTROABS 9.9*  --  3.8  --  2.6 3.6  --   --   HGB 8.3*   < > 6.3* 8.7* 8.2* 8.8* 8.9* 8.8*  HCT 28.0*   < > 21.6* 29.6* 27.7* 30.3* 30.7* 30.5*  MCV 85.9   < > 86.4 86.8 86.8 89.1 87.7 88.9  PLT 460*   < > 331 414* 378 441* 499* 512*   < > = values in this interval not displayed.     Basic Metabolic Panel: Recent Labs  Lab 09/05/21 1041 09/06/21 0554 09/07/21 0359 09/08/21 0433 09/09/21 0408 09/10/21 0345 09/11/21 0356  NA 128*   < > 138 139 136 134* 133*  K 4.0   < > 3.2* 3.4* 4.1 3.9 3.9  CL 92*   < > 103 101 99 98 98  CO2 27   < >  30 32 32 31 28  GLUCOSE 115*   < > 82 84 99 96 90  BUN 13   < > 8 12 17 16 14   CREATININE 0.58   < > 0.40* 0.46 0.51 0.48 0.59  CALCIUM 8.1*   < > 7.7* 8.2* 8.8* 8.6* 8.3*  MG 2.0  --  2.0 2.0 2.1  --  2.1   < > = values in this interval not displayed.     Liver Function Tests: Recent Labs  Lab 09/05/21 1041 09/07/21 0359 09/08/21 0433 09/09/21 0408  AST 25 13* 14* 15  ALT 26 18 17 15   ALKPHOS 119 75 79 78  BILITOT 0.5 0.3 0.3 0.3  PROT 6.0* 4.8* 5.3* 5.4*  ALBUMIN 2.7* 2.1* 2.3* 2.4*      Radiology Studies: No results found.    LOS: 6 days    Flora Lipps, MD [facility] 09/11/2021, 1:42 PM

## 2021-09-11 NOTE — TOC Progression Note (Signed)
Transition of Care [facility]) - Progression Note    Patient Details  Name: [name] MRN: [id number] Date of Birth: 10-10-1955  Transition of Care [facility]) CM/SW Contact  Joaquin Courts, RN Phone Number: 09/11/2021, 3:25 PM  Clinical Narrative:    Ronne Binning to [facility] rehab/[place] rehab rep to discuss referral, information sent via [place] for review.   Expected Discharge Plan: [place] Barriers to Discharge: Continued Medical Work up  Expected Discharge Plan and Services Expected Discharge Plan: [place]   Discharge Planning Services: CM Consult Post Acute Care Choice: Resumption of Svcs/PTA Provider Living arrangements for the past 2 months: Single Family Home                                       Social Determinants of Health (SDOH) Interventions    Readmission Risk Interventions Readmission Risk Prevention Plan 09/08/2021  Transportation Screening Complete  Medication Review [employer]) Complete  PCP or Specialist appointment within 3-5 days of discharge Complete  HRI or [place] Complete  SW Recovery Care/Counseling Consult Complete  [place] Complete  Some recent data might be hidden

## 2021-09-11 NOTE — Progress Notes (Signed)
MD has ordered PO pain Medications. Pt updated and will will give oral pain Medications.

## 2021-09-12 DIAGNOSIS — E871 Hypo-osmolality and hyponatremia: Secondary | ICD-10-CM | POA: Diagnosis not present

## 2021-09-12 DIAGNOSIS — R627 Adult failure to thrive: Secondary | ICD-10-CM | POA: Diagnosis not present

## 2021-09-12 DIAGNOSIS — D638 Anemia in other chronic diseases classified elsewhere: Secondary | ICD-10-CM | POA: Diagnosis not present

## 2021-09-12 DIAGNOSIS — L03115 Cellulitis of right lower limb: Secondary | ICD-10-CM | POA: Diagnosis not present

## 2021-09-12 MED ORDER — PANTOPRAZOLE SODIUM 40 MG PO TBEC
40.0000 mg | DELAYED_RELEASE_TABLET | Freq: Two times a day (BID) | ORAL | Status: DC
  Administered 2021-09-12 – 2021-09-15 (×7): 40 mg via ORAL
  Filled 2021-09-12 (×7): qty 1

## 2021-09-12 MED ORDER — PANTOPRAZOLE SODIUM 40 MG PO TBEC: 40.0000 mg | DELAYED_RELEASE_TABLET | Freq: Every day | ORAL | Status: DC

## 2021-09-12 MED ORDER — SUCRALFATE 1 G PO TABS
1.0000 g | ORAL_TABLET | Freq: Three times a day (TID) | ORAL | Status: DC
  Administered 2021-09-12 – 2021-09-15 (×13): 1 g via ORAL
  Filled 2021-09-12 (×13): qty 1

## 2021-09-12 NOTE — Plan of Care (Signed)
  Problem: Education: Goal: Knowledge of General Education information will improve Description Including pain rating scale, medication(s)/side effects and non-pharmacologic comfort measures Outcome: Progressing   Problem: Health Behavior/Discharge Planning: Goal: Ability to manage health-related needs will improve Outcome: Progressing   

## 2021-09-12 NOTE — Progress Notes (Signed)
PROGRESS NOTE    [name]  [id number] DOB: 18-Jun-1956 DOA: 09/05/2021 PCP: Crist Infante, MD    Brief Narrative:  [name] is a 66 y.o. female with medical history significant for COPD, depression, hypertension, hypothyroidism, who presented to the emergency department with feeling of not feeling well.  Of note patient was recently discharged from [facility] long hospital after being treated for septic shock secondary to right lower extremity cellulitis and had completed 10-day course of antibiotic.  General surgery did not recommend any surgical intervention.  Patient also required midodrine and was taken off Florinef.  After discharge from the hospital, patient continued to report worsening pain in the leg.  This is her third hospitalization for the same problem.  On previous admission, patient had refused skilled nursing facility placement.  In the ED, temperature was 101.6 F, was tachycardic and tachypneic.  Blood pressure was low which improved with 2 L of IV fluid bolus and patient was given IV Levophed.  Femur x-ray showed large defect in the soft tissue with soft tissue edema.  Patient was started on IV vancomycin cefepime metronidazole and was admitted to hospital for further evaluation and treatment.  Infectious disease was consulted during hospitalization for antibiotic recommendation.  At this time, patient is awaiting for disposition to skilled nursing facility and is stable    Assessment and Plan: * Cellulitis of right lower extremity- (present on admission) CT scan of the extremity did not show evidence of abscess or deep infection.  General surgery reviewed and did not recommend any debridement.  Continue wound care and antibiotics.  It looks like patient is unable to manage the wounds at home.    ID was consulted to recommend doxycycline and Augmentin until 09/14/2021.  Afebrile at this time.  No leukocytosis.  Blood cultures negative.  Septic shock ([place])- (present on  admission) Resolved at this time.  Patient was initially on Levophed which has been discontinued.  Currently on oral antibiotic.  Blood cultures and urine cultures been negative.  Chronic hypotension- (present on admission) Patient was initiated on midodrine on last admission.  Continue to hold antihypertensives.  Asymptomatic  Opioid dependence ([place])- (present on admission) Morphine especially during dressing changes.  Added oral Percocet  COPD (chronic obstructive pulmonary disease) ([place])- (present on admission) Continue bronchodilators, oxygen.    Appears compensated at this time  Home Infestation by bed bug- (present on admission) TOC on board for safe disposition.  Edentulous- (present on admission)  dysphagia 3 soft diet for now.  Leukocytosis- (present on admission) Resolved at this time.  Latest WBC at 13.1  Hyponatremia- (present on admission) Mild.  Latest sodium of 133.  Received IV hydration.  Noncompliance Third admission for the same.  Unable to care for self at home.  Will need placement.  Awaiting skilled nursing facility placement  Anemia, chronic disease- (present on admission) Latest hemoglobin of 8.8 from8.9< 8.2 <6.3.  Received 1 unit of packed RBC during hospitalization  Hypokalemia- (present on admission) Improved after replacement.  Latest potassium of 3.9  Open leg wound, right, sequela No need for surgical debridement as per surgery.  Wound care on board.  Continue wound care.   Pressure injury of skin- (present on admission) - Continue air mattress overlay at all times  Adult failure to thrive- (present on admission) Multifactorial.  Encourage oral nutrition.  Anxiety- (present on admission) Continue Xanax for now.  HLD (hyperlipidemia)- (present on admission) Continue atorvastatin  Hypothyroidism- (present on admission) Continue Synthroid  Depression- (present on admission) Continue Effexor and Wellbutrin  Sleep apnea- (present on  admission) Continue nightly CPAP  GERD (gastroesophageal reflux disease)- (present on admission) Continue Protonix.  We will add sucralfate due to nausea and retrosternal burning sensation as well.  Essential hypertension- (present on admission) Antihypertensives are on hold due to hypotension.  On midodrine.    DVT prophylaxis: heparin injection 5,000 Units Start: 09/05/21 2200   Code Status:     Code Status: DNR  Disposition: Skilled nursing facility.   Status is: Inpatient  Remains inpatient appropriate because: Need for rehabilitation, antibiotics, wound care,   Family Communication:   I spoke with the patient's friend listed in the computer on 09/10/2021.   Consultants:  Infectious disease General surgery  Procedures:  Wound care  Antimicrobials:  Doxycycline and amoxicillin until 2/13  Subjective: Today, patient was seen and examined at bedside.  Complains of mild throat pain and burning sensation in the throat and nausea.  Objective: Vitals:   09/11/21 1222 09/12/21 0546 09/12/21 0858 09/12/21 0900  BP: (!) 117/53 94/76    Pulse: 78 99    Resp: 12 18    Temp: 98.4 F (36.9 C) 99.5 F (37.5 C)    TempSrc: Oral Oral    SpO2: 99% 92% 92% 92%  Weight:      Height:        Intake/Output Summary (Last 24 hours) at 09/12/2021 1040 Last data filed at 09/12/2021 0500 Gross per 24 hour  Intake 720 ml  Output 3350 ml  Net -2630 ml   Filed Weights   09/05/21 1038 09/05/21 1857  Weight: 94.3 kg 93.4 kg   Body mass index is 34.28 kg/m.   Physical Examination: General: Obese built, not in obvious distress slow to respond, HENT:   No scleral pallor or icterus noted. Oral mucosa is moist.  Chest:  Clear breath sounds.  Diminished breath sounds bilaterally. No crackles or wheezes.  CVS: S1 &S2 heard. No murmur.  Regular rate and rhythm. Abdomen: Soft, nontender, nondistended.  Bowel sounds are heard.   Extremities: Right upper thigh wound with dressing, right  heel wound. Psych: Alert, awake and oriented, anxious mood CNS:  No cranial nerve deficits.  Power equal in all extremities.   Skin: Warm and dry.  Right thigh and heel wound   Data Reviewed:   CBC: Recent Labs  Lab 09/05/21 1041 09/06/21 0554 09/07/21 0359 09/07/21 1609 09/08/21 0433 09/09/21 0408 09/10/21 0345 09/11/21 0356  WBC 13.8*   < > 7.0 9.2 6.4 8.9 9.0 10.1  NEUTROABS 9.9*  --  3.8  --  2.6 3.6  --   --   HGB 8.3*   < > 6.3* 8.7* 8.2* 8.8* 8.9* 8.8*  HCT 28.0*   < > 21.6* 29.6* 27.7* 30.3* 30.7* 30.5*  MCV 85.9   < > 86.4 86.8 86.8 89.1 87.7 88.9  PLT 460*   < > 331 414* 378 441* 499* 512*   < > = values in this interval not displayed.    Basic Metabolic Panel: Recent Labs  Lab 09/05/21 1041 09/06/21 0554 09/07/21 0359 09/08/21 0433 09/09/21 0408 09/10/21 0345 09/11/21 0356  NA 128*   < > 138 139 136 134* 133*  K 4.0   < > 3.2* 3.4* 4.1 3.9 3.9  CL 92*   < > 103 101 99 98 98  CO2 27   < > 30 32 32 31 28  GLUCOSE 115*   < > 82 84 99  96 90  BUN 13   < > 8 12 17 16 14   CREATININE 0.58   < > 0.40* 0.46 0.51 0.48 0.59  CALCIUM 8.1*   < > 7.7* 8.2* 8.8* 8.6* 8.3*  MG 2.0  --  2.0 2.0 2.1  --  2.1   < > = values in this interval not displayed.    Liver Function Tests: Recent Labs  Lab 09/05/21 1041 09/07/21 0359 09/08/21 0433 09/09/21 0408  AST 25 13* 14* 15  ALT 26 18 17 15   ALKPHOS 119 75 79 78  BILITOT 0.5 0.3 0.3 0.3  PROT 6.0* 4.8* 5.3* 5.4*  ALBUMIN 2.7* 2.1* 2.3* 2.4*     Radiology Studies: No results found.    LOS: 7 days    Flora Lipps, MD [facility] 09/12/2021, 10:40 AM

## 2021-09-13 DIAGNOSIS — E039 Hypothyroidism, unspecified: Secondary | ICD-10-CM

## 2021-09-13 DIAGNOSIS — R627 Adult failure to thrive: Secondary | ICD-10-CM | POA: Diagnosis not present

## 2021-09-13 DIAGNOSIS — E871 Hypo-osmolality and hyponatremia: Secondary | ICD-10-CM | POA: Diagnosis not present

## 2021-09-13 DIAGNOSIS — A419 Sepsis, unspecified organism: Secondary | ICD-10-CM | POA: Diagnosis not present

## 2021-09-13 DIAGNOSIS — L03115 Cellulitis of right lower limb: Secondary | ICD-10-CM | POA: Diagnosis not present

## 2021-09-13 DIAGNOSIS — D638 Anemia in other chronic diseases classified elsewhere: Secondary | ICD-10-CM | POA: Diagnosis not present

## 2021-09-13 DIAGNOSIS — B888 Other specified infestations: Secondary | ICD-10-CM

## 2021-09-13 LAB — GLUCOSE, CAPILLARY: Glucose-Capillary: 138 mg/dL — ABNORMAL HIGH (ref 70–99)

## 2021-09-13 NOTE — Progress Notes (Signed)
PROGRESS NOTE    [name]  [id number] DOB: May 22, 1956 DOA: 09/05/2021 PCP: Crist Infante, MD    Brief Narrative:  [name] is a 66 y.o. female with medical history significant for COPD, depression, hypertension, hypothyroidism, who presented to the emergency department with feeling of not feeling well.  Of note patient was recently discharged from [facility] long hospital after being treated for septic shock secondary to right lower extremity cellulitis and had completed 10-day course of antibiotic.  General surgery did not recommend any surgical intervention.  Patient also required midodrine and was taken off Florinef.  After discharge from the hospital, patient continued to report worsening pain in the leg.  This is her third hospitalization for the same problem.  On previous admission, patient had refused skilled nursing facility placement.  In the ED, temperature was 101.6 F, was tachycardic and tachypneic.  Blood pressure was low which improved with 2 L of IV fluid bolus and patient was given IV Levophed.  Femur x-ray showed large defect in the soft tissue with soft tissue edema.  Patient was started on IV vancomycin cefepime metronidazole and was admitted to hospital for further evaluation and treatment.  Infectious disease was consulted during hospitalization for antibiotic recommendation.  At this time, patient is awaiting for disposition to skilled nursing facility and is stable    Assessment and Plan: * Cellulitis of right lower extremity- (present on admission) Seen by infectious disease during hospitalization.  Continue doxycycline and Augmentin until 09/14/2021   Afebrile at this time.  No leukocytosis.  Blood cultures negative.  Septic shock ([place])- (present on admission) Resolved. Blood cultures and urine cultures been negative.  On oral antibiotic at this time.  Chronic hypotension- (present on admission) Patient was initiated on midodrine on last admission.  Continue to hold  antihypertensives.  Asymptomatic  Opioid dependence ([place])- (present on admission) Morphine especially during dressing changes.  Continue oral  Percocet  COPD (chronic obstructive pulmonary disease) ([place])- (present on admission) Continue bronchodilators, oxygen.    Appears compensated at this time  Home Infestation by bed bug- (present on admission) TOC on board for safe disposition.  Edentulous- (present on admission) Continue dysphagia 3 diet.  Leukocytosis- (present on admission) Resolved at this time.  Latest WBC at 10.1  Hyponatremia- (present on admission) Mild.  Latest sodium of 133.  Received IV hydration.  Noncompliance Third admission for the same.  Unable to care for self at home.  Will need placement.  Awaiting skilled nursing facility placement  Anemia, chronic disease- (present on admission) Latest hemoglobin of 8.8 from8.9< 8.2 <6.3.  Received 1 unit of packed RBC during hospitalization  Hypokalemia- (present on admission) Improved after replacement.  Latest potassium of 3.9  Open leg wound, right, sequela No need for surgical debridement as per surgery.  Wound care on board.  Continue wound care.   Pressure injury of skin- (present on admission) - Continue air mattress overlay at all times  Adult failure to thrive- (present on admission) Multifactorial.  Encourage oral nutrition.  Anxiety- (present on admission) Continue Xanax for now.  HLD (hyperlipidemia)- (present on admission) Continue atorvastatin  Hypothyroidism- (present on admission) Continue Synthroid  Depression- (present on admission) Continue Effexor and Wellbutrin  Sleep apnea- (present on admission) Continue nightly CPAP  GERD (gastroesophageal reflux disease)- (present on admission) Continue Protonix sucralfate.  Essential hypertension- (present on admission)   On midodrine.  Antihypertensives on hold    DVT prophylaxis: heparin injection 5,000 Units Start: 09/05/21  2200  Code Status:     Code Status: DNR  Disposition: Skilled nursing facility.   Status is: Inpatient  Remains inpatient appropriate because: Need for rehabilitation, antibiotics, wound care,   Family Communication:   I spoke with the patient's friend listed in the computer on 09/10/2021.   Consultants:  Infectious disease General surgery  Procedures:  Wound care  Antimicrobials:  Doxycycline and amoxicillin until 2/13  Subjective: Today, patient was seen and examined at bedside.  Complains of mild nausea and has had a bowel movement.    Objective: Vitals:   09/12/21 2018 09/13/21 0350 09/13/21 0900 09/13/21 0901  BP: 124/60 (!) 108/55    Pulse: 70 90    Resp: 20 20    Temp: (!) 97.2 F (36.2 C) (!) 97.4 F (36.3 C)    TempSrc: Oral Oral    SpO2: 98% 98% 98% 98%  Weight:      Height:        Intake/Output Summary (Last 24 hours) at 09/13/2021 1052 Last data filed at 09/13/2021 0353 Gross per 24 hour  Intake --  Output 3550 ml  Net -3550 ml   Filed Weights   09/05/21 1038 09/05/21 1857  Weight: 94.3 kg 93.4 kg   Body mass index is 34.28 kg/m.   Physical Examination: General: Obese built, not in obvious distress, slow to respond HENT:   No scleral pallor or icterus noted. Oral mucosa is moist.  Chest:  Clear breath sounds.  Diminished breath sounds bilaterally. No crackles or wheezes.  CVS: S1 &S2 heard. No murmur.  Regular rate and rhythm. Abdomen: Soft, nontender, nondistended.  Bowel sounds are heard.   Extremities: Right upper thigh wound with dressing, right heel wound  psych: Alert, awake and oriented, anxious mood CNS:  No cranial nerve deficits.  Power equal in all extremities.   Skin: Warm and dry.  Right thigh and heel wound   Data Reviewed:   CBC: Recent Labs  Lab 09/07/21 0359 09/07/21 1609 09/08/21 0433 09/09/21 0408 09/10/21 0345 09/11/21 0356  WBC 7.0 9.2 6.4 8.9 9.0 10.1  NEUTROABS 3.8  --  2.6 3.6  --   --   HGB 6.3* 8.7*  8.2* 8.8* 8.9* 8.8*  HCT 21.6* 29.6* 27.7* 30.3* 30.7* 30.5*  MCV 86.4 86.8 86.8 89.1 87.7 88.9  PLT 331 414* 378 441* 499* 512*    Basic Metabolic Panel: Recent Labs  Lab 09/07/21 0359 09/08/21 0433 09/09/21 0408 09/10/21 0345 09/11/21 0356  NA 138 139 136 134* 133*  K 3.2* 3.4* 4.1 3.9 3.9  CL 103 101 99 98 98  CO2 30 32 32 31 28  GLUCOSE 82 84 99 96 90  BUN 8 12 17 16 14   CREATININE 0.40* 0.46 0.51 0.48 0.59  CALCIUM 7.7* 8.2* 8.8* 8.6* 8.3*  MG 2.0 2.0 2.1  --  2.1    Liver Function Tests: Recent Labs  Lab 09/07/21 0359 09/08/21 0433 09/09/21 0408  AST 13* 14* 15  ALT 18 17 15   ALKPHOS 75 79 78  BILITOT 0.3 0.3 0.3  PROT 4.8* 5.3* 5.4*  ALBUMIN 2.1* 2.3* 2.4*     Radiology Studies: No results found.    LOS: 8 days    Flora Lipps, MD [facility] 09/13/2021, 10:52 AM

## 2021-09-14 DIAGNOSIS — D638 Anemia in other chronic diseases classified elsewhere: Secondary | ICD-10-CM | POA: Diagnosis not present

## 2021-09-14 DIAGNOSIS — A419 Sepsis, unspecified organism: Secondary | ICD-10-CM | POA: Diagnosis not present

## 2021-09-14 DIAGNOSIS — E871 Hypo-osmolality and hyponatremia: Secondary | ICD-10-CM | POA: Diagnosis not present

## 2021-09-14 DIAGNOSIS — R627 Adult failure to thrive: Secondary | ICD-10-CM | POA: Diagnosis not present

## 2021-09-14 DIAGNOSIS — L03115 Cellulitis of right lower limb: Secondary | ICD-10-CM | POA: Diagnosis not present

## 2021-09-14 NOTE — Progress Notes (Signed)
Occupational Therapy Treatment Patient Details Name: [name] MRN: [id number] DOB: November 04, 1955 Today's Date: 09/14/2021   History of present illness 66 yo female admitted with R LE cellulitis, fall, adult FTT. This is third hospitalization for this Dx.  Hx of COPD, depression, cellulitis, R LE wound.   OT comments  Patient participated in sitting on edge of bed briefly limited by pain and nausea at this time. Patient was eager to progress to rehab. Patient was educated on general hospital to rehab process. Patient would continue to benefit from skilled OT services at this time while admitted and after d/c to address noted deficits in order to improve overall safety and independence in ADLs. Patient's discharge plan remains appropriate at this time. OT will continue to follow acutely.      Recommendations for follow up therapy are one component of a multi-disciplinary discharge planning process, led by the attending physician.  Recommendations may be updated based on patient status, additional functional criteria and insurance authorization.    Follow Up Recommendations  Skilled nursing-short term rehab (<3 hours/day)    Assistance Recommended at Discharge Frequent or constant Supervision/Assistance  Patient can return home with the following  A lot of help with walking and/or transfers;A lot of help with bathing/dressing/bathroom;Assistance with cooking/housework;Direct supervision/assist for medications management;Direct supervision/assist for financial management;Assist for transportation;Help with stairs or ramp for entrance   Equipment Recommendations  None recommended by OT    Recommendations for Other Services      Precautions / Restrictions Precautions Precautions: Fall Precaution Comments: right posterior thigh wound, right foot(plantar heel), buttock wounds Restrictions Weight Bearing Restrictions: No       Mobility Bed Mobility Overal bed mobility: Needs  Assistance             General bed mobility comments: see ADL note    Transfers                         Balance                                           ADL either performed or assessed with clinical judgement   ADL Overall ADL's : Needs assistance/impaired                                       General ADL Comments: Patient able to scoot to edge of bed with min A to keep pressure off posterior R thigh. patient was able to sit breifly on edge of bed with noted increased pain. patient was able to clear bottom off edge of bed with RW mod A to maintain  to change pad out from under patient with max A. Patient was amx A x 2 for repositioning in bed at end of session.    Extremity/Trunk Assessment              Vision       Perception     Praxis      Cognition Arousal/Alertness: Awake/alert Behavior During Therapy: WFL for tasks assessed/performed Overall Cognitive Status: Within Functional Limits for tasks assessed  Exercises      Shoulder Instructions       General Comments      Pertinent Vitals/ Pain       Pain Assessment Pain Assessment: Faces Faces Pain Scale: Hurts even more Pain Location: R posterior  thigh Pain Descriptors / Indicators: Grimacing, Guarding, Moaning Pain Intervention(s): Limited activity within patient's tolerance, Monitored during session  Home Living                                          Prior Functioning/Environment              Frequency  Min 2X/week        Progress Toward Goals  OT Goals(current goals can now be found in the care plan section)  Progress towards OT goals: Progressing toward goals     Plan Discharge plan remains appropriate    Co-evaluation                 AM-PAC OT "6 Clicks" Daily Activity     Outcome Measure   Help from another person eating meals?:  None Help from another person taking care of personal grooming?: A Little Help from another person toileting, which includes using toliet, bedpan, or urinal?: A Lot Help from another person bathing (including washing, rinsing, drying)?: A Lot Help from another person to put on and taking off regular upper body clothing?: A Little Help from another person to put on and taking off regular lower body clothing?: A Lot 6 Click Score: 16    End of Session Equipment Utilized During Treatment: Rolling walker (2 wheels);Gait belt  OT Visit Diagnosis: Other abnormalities of gait and mobility (R26.89);History of falling (Z91.81);Muscle weakness (generalized) (M62.81)   Activity Tolerance Patient limited by pain   Patient Left with call bell/phone within reach;in bed   Nurse Communication Other (comment) (cleared patient to participate in session and then updaetd on patients request for nausea medication)        Time: 1350-1416 OT Time Calculation (min): 26 min  Charges: OT General Charges $OT Visit: 1 Visit OT Treatments $Therapeutic Activity: 23-37 mins  Jackelyn Poling OTR/L, MS Acute Rehabilitation Department Office# [phone] Pager# [phone]   Marcellina Millin 09/14/2021, 4:00 PM

## 2021-09-14 NOTE — Progress Notes (Addendum)
PROGRESS NOTE    [name]  [id number] DOB: [birth year] DOA: 09/05/2021 PCP: Crist Infante, MD    Brief Narrative:  [name] is a 66 y.o. female with medical history significant for COPD, depression, hypertension, hypothyroidism, who presented to the emergency department with feeling of not feeling well.  Of note patient was recently discharged from [facility] long hospital after being treated for septic shock secondary to right lower extremity cellulitis and had completed 10-day course of antibiotic.  General surgery did not recommend any surgical intervention.  Patient also required midodrine and was taken off Florinef.  After discharge from the hospital, patient continued to report worsening pain in the leg.  This is her third hospitalization for the same problem.  On previous admission, patient had refused skilled nursing facility placement.  In the ED, temperature was 101.6 F, was tachycardic and tachypneic.  Blood pressure was low which improved with 2 L of IV fluid bolus and patient was given IV Levophed.  Femur x-ray showed large defect in the soft tissue with soft tissue edema.  Patient was started on IV vancomycin cefepime metronidazole and was admitted to hospital for further evaluation and treatment.  Infectious disease was consulted during hospitalization for antibiotic recommendation.  At this time, patient is awaiting for disposition to skilled nursing facility and is stable    Assessment and Plan: * Cellulitis of right lower extremity- (present on admission) Seen by infectious disease during hospitalization.  Continue doxycycline and Augmentin until 09/14/2021.   Afebrile at this time.  No leukocytosis.  Blood cultures negative.  Septic shock ([place])- (present on admission) Resolved. Blood cultures and urine cultures been negative.  On oral antibiotic at this time.  Chronic hypotension- (present on admission) Patient was initiated on midodrine on last admission.  Continue to  hold antihypertensives.  Asymptomatic  Opioid dependence ([place])- (present on admission) Morphine especially during dressing changes.  Continue oral  Percocet  COPD (chronic obstructive pulmonary disease) ([place])- (present on admission) Continue bronchodilators, oxygen.    Appears compensated at this time  Home Infestation by bed bug- (present on admission) TOC on board for safe disposition.  Edentulous- (present on admission) Continue dysphagia 3 diet.  Leukocytosis- (present on admission) Resolved at this time.  Latest WBC at 10.1  Hyponatremia- (present on admission) Mild.  Latest sodium of 133.  Received IV hydration.  Noncompliance Third admission for the same.  Unable to care for self at home.  Will need placement.  Awaiting skilled nursing facility placement  Anemia, chronic disease- (present on admission) Latest hemoglobin of 8.8 from8.9< 8.2 <6.3.  Received 1 unit of packed RBC during hospitalization  Hypokalemia- (present on admission) Improved after replacement.  Latest potassium of 3.9  Open leg wound, right, sequela No need for surgical debridement as per surgery.  Wound care on board.  Continue wound care.   Pressure injury of skin- (present on admission) - Continue air mattress overlay at all times  Adult failure to thrive- (present on admission) Multifactorial.  Encourage oral nutrition.  Anxiety- (present on admission) Continue Xanax for now.  HLD (hyperlipidemia)- (present on admission) Continue atorvastatin  Hypothyroidism- (present on admission) Continue Synthroid  Depression- (present on admission) Continue Effexor and Wellbutrin  Sleep apnea- (present on admission) Continue nightly CPAP  GERD (gastroesophageal reflux disease)- (present on admission) Continue Protonix and sucralfate.  Essential hypertension- (present on admission)   On midodrine.  Antihypertensives on hold    DVT prophylaxis: heparin injection 5,000 Units Start: 09/05/21  2200  Code Status:     Code Status: DNR  Disposition: Skilled nursing facility.  Medically stable for disposition  Status is: Inpatient  Remains inpatient appropriate because: Awaiting for skilled nursing of facility placement, antibiotics, wound care,   Family Communication:   I spoke with the patient's friend listed in the computer on 09/10/2021.  Spoke with the patient at bedside  Consultants:  Infectious disease General surgery  Procedures:  Wound care  Antimicrobials:  Doxycycline and amoxicillin until 2/13  Subjective: Today, patient was seen and examined at bedside.  Patient still complains of mild nausea after eating ice cream.  Denies any fever chills or rigor.    Objective: Vitals:   09/13/21 2001 09/13/21 2027 09/14/21 0523 09/14/21 1201  BP:  (!) 115/46 (!) 93/55 (!) 101/51  Pulse:  79 78 82  Resp:  20 17 17   Temp:  98 F (36.7 C) 97.8 F (36.6 C) 99 F (37.2 C)  TempSrc:  Oral Oral Oral  SpO2: 97% 99% 94% 94%  Weight:      Height:        Intake/Output Summary (Last 24 hours) at 09/14/2021 1327 Last data filed at 09/14/2021 1206 Gross per 24 hour  Intake 1312 ml  Output 2450 ml  Net -1138 ml   Filed Weights   09/05/21 1038 09/05/21 1857  Weight: 94.3 kg 93.4 kg   Body mass index is 34.28 kg/m.   Physical Examination: General: Obese built, not in obvious distress, slow to respond HENT:   No scleral pallor or icterus noted. Oral mucosa is moist.  Chest:  Clear breath sounds.  Diminished breath sounds bilaterally. No crackles or wheezes.  CVS: S1 &S2 heard. No murmur.  Regular rate and rhythm. Abdomen: Soft, nontender, nondistended.  Bowel sounds are heard.   Extremities:   Peripheral pulses are palpable.  Right upper thigh wound with dressing, right heel wound Psych: Alert, awake and oriented, anxious mood CNS:  No cranial nerve deficits.  Power equal in all extremities.   Skin: Warm and dry.  Right thigh and heel wound  Data Reviewed:    CBC: Recent Labs  Lab 09/07/21 1609 09/08/21 0433 09/09/21 0408 09/10/21 0345 09/11/21 0356  WBC 9.2 6.4 8.9 9.0 10.1  NEUTROABS  --  2.6 3.6  --   --   HGB 8.7* 8.2* 8.8* 8.9* 8.8*  HCT 29.6* 27.7* 30.3* 30.7* 30.5*  MCV 86.8 86.8 89.1 87.7 88.9  PLT 414* 378 441* 499* 512*    Basic Metabolic Panel: Recent Labs  Lab 09/08/21 0433 09/09/21 0408 09/10/21 0345 09/11/21 0356  NA 139 136 134* 133*  K 3.4* 4.1 3.9 3.9  CL 101 99 98 98  CO2 32 32 31 28  GLUCOSE 84 99 96 90  BUN 12 17 16 14   CREATININE 0.46 0.51 0.48 0.59  CALCIUM 8.2* 8.8* 8.6* 8.3*  MG 2.0 2.1  --  2.1    Liver Function Tests: Recent Labs  Lab 09/08/21 0433 09/09/21 0408  AST 14* 15  ALT 17 15  ALKPHOS 79 78  BILITOT 0.3 0.3  PROT 5.3* 5.4*  ALBUMIN 2.3* 2.4*     Radiology Studies: No results found.    LOS: 9 days    Flora Lipps, MD [facility] 09/14/2021, 1:27 PM

## 2021-09-14 NOTE — Progress Notes (Signed)
Nutrition Follow-up  DOCUMENTATION CODES:   Not applicable  INTERVENTION:  - continue Ensure BID and Juven BID.   NUTRITION DIAGNOSIS:   Increased nutrient needs related to wound healing as evidenced by estimated needs. -ongoing  GOAL:   Patient will meet greater than or equal to 90% of their needs -minimally met on average  MONITOR:   PO intake, Supplement acceptance, Labs, Weight trends, Skin, I & O's   ASSESSMENT:   66 y.o. female with medical history significant for COPD, depression, hypertension, hypothyroidism, who presented to the ED with complaints of feeling ill.      Recent prolonged hospitalization 1/13-2/3. Admitted for cellulitis of RLE.  Limited meal intakes over the past 1 week. Patient well known to this RD from previous admission. Patient reports good appetite and that she has eaten most of both meals today.  She enjoys Ensure supplements. She has been accepting all bottles of the supplement offered to her. He has also been accepting all packets of Juven.   She has not been weighed since admission on 2/4. Non-pitting edema to BUE and mild pitting edema to BLE documented in the edema section of flow sheet.    Labs reviewed; Na: 133 mmol/l, Ca: 8.3 mg/dl.  Medications reviewed; 150 mcg oral synthroid/day, 1 tablet multivitamin with minerals/day, 40 mg oral protonix BID, 1 g carafate TID.    Diet Order:   Diet Order             DIET DYS 3 Room service appropriate? Yes; Fluid consistency: Thin  Diet effective now                   EDUCATION NEEDS:   Education needs have been addressed  Skin:  Skin Integrity Issues:: Stage II, Stage III, Other (Comment) Stage II: bilateral buttocks Stage III: right thigh Other: venous stasis ulcers to bilateral heels  Last BM:  2/12 (type 1, large amount)  Height:   Ht Readings from Last 1 Encounters:  09/05/21 5' 5"  (1.651 m)    Weight:   Wt Readings from Last 1 Encounters:  09/05/21 93.4 kg      BMI:  Body mass index is 34.28 kg/m.   Estimated Nutritional Needs:  Kcal:  2150-2350 Protein:  110-125g Fluid:  2.3L/day     Jarome Matin, MS, RD, LDN Inpatient Clinical Dietitian RD pager # available in [place]  After hours/weekend pager # available in [facility]

## 2021-09-15 DIAGNOSIS — E785 Hyperlipidemia, unspecified: Secondary | ICD-10-CM

## 2021-09-15 DIAGNOSIS — R627 Adult failure to thrive: Secondary | ICD-10-CM | POA: Diagnosis not present

## 2021-09-15 DIAGNOSIS — D638 Anemia in other chronic diseases classified elsewhere: Secondary | ICD-10-CM | POA: Diagnosis not present

## 2021-09-15 DIAGNOSIS — L03115 Cellulitis of right lower limb: Secondary | ICD-10-CM | POA: Diagnosis not present

## 2021-09-15 DIAGNOSIS — E871 Hypo-osmolality and hyponatremia: Secondary | ICD-10-CM | POA: Diagnosis not present

## 2021-09-15 DIAGNOSIS — A419 Sepsis, unspecified organism: Secondary | ICD-10-CM | POA: Diagnosis not present

## 2021-09-15 LAB — CBC
HCT: 32.8 % — ABNORMAL LOW (ref 36.0–46.0)
Hemoglobin: 9.5 g/dL — ABNORMAL LOW (ref 12.0–15.0)
MCH: 25.3 pg — ABNORMAL LOW (ref 26.0–34.0)
MCHC: 29 g/dL — ABNORMAL LOW (ref 30.0–36.0)
MCV: 87.5 fL (ref 80.0–100.0)
Platelets: 619 10*3/uL — ABNORMAL HIGH (ref 150–400)
RBC: 3.75 MIL/uL — ABNORMAL LOW (ref 3.87–5.11)
RDW: 20.1 % — ABNORMAL HIGH (ref 11.5–15.5)
WBC: 9.9 10*3/uL (ref 4.0–10.5)
nRBC: 0 % (ref 0.0–0.2)

## 2021-09-15 LAB — BASIC METABOLIC PANEL
Anion gap: 9 (ref 5–15)
BUN: 13 mg/dL (ref 8–23)
CO2: 27 mmol/L (ref 22–32)
Calcium: 9.1 mg/dL (ref 8.9–10.3)
Chloride: 95 mmol/L — ABNORMAL LOW (ref 98–111)
Creatinine, Ser: 0.73 mg/dL (ref 0.44–1.00)
GFR, Estimated: >60 mL/min (ref >60)
Glucose, Bld: 102 mg/dL — ABNORMAL HIGH (ref 70–99)
Potassium: 3.8 mmol/L (ref 3.5–5.1)
Sodium: 131 mmol/L — ABNORMAL LOW (ref 135–145)

## 2021-09-15 LAB — MAGNESIUM: Magnesium: 2.1 mg/dL (ref 1.7–2.4)

## 2021-09-15 MED ORDER — SUCRALFATE 1 G PO TABS: 1.0000 g | ORAL_TABLET | Freq: Three times a day (TID) | ORAL | 0 refills | Status: DC

## 2021-09-15 MED ORDER — ADULT MULTIVITAMIN W/MINERALS CH: 1.0000 | ORAL_TABLET | Freq: Every day | ORAL | Status: DC

## 2021-09-15 MED ORDER — OXYCODONE-ACETAMINOPHEN 5-325 MG PO TABS: 1.0000 | ORAL_TABLET | Freq: Three times a day (TID) | ORAL | 0 refills | Status: AC

## 2021-09-15 MED ORDER — ACETAMINOPHEN 325 MG PO TABS: 650.0000 mg | ORAL_TABLET | Freq: Four times a day (QID) | ORAL | Status: DC | PRN

## 2021-09-15 MED ORDER — PANTOPRAZOLE SODIUM 40 MG PO TBEC: DELAYED_RELEASE_TABLET | ORAL | 2 refills | Status: DC

## 2021-09-15 MED ORDER — HYDROCORTISONE 0.5 % EX CREA: TOPICAL_CREAM | Freq: Three times a day (TID) | CUTANEOUS | 0 refills | Status: DC | PRN

## 2021-09-15 MED ORDER — ONDANSETRON HCL 4 MG PO TABS: 4.0000 mg | ORAL_TABLET | Freq: Four times a day (QID) | ORAL | 0 refills | Status: DC | PRN

## 2021-09-15 MED ORDER — ALPRAZOLAM 1 MG PO TABS: 1.0000 mg | ORAL_TABLET | Freq: Three times a day (TID) | ORAL | 0 refills | Status: AC

## 2021-09-15 NOTE — Plan of Care (Signed)
°  Problem: Education: Goal: Knowledge of General Education information will improve Description: Including pain rating scale, medication(s)/side effects and non-pharmacologic comfort measures 09/15/2021 1339 by Zadie Rhine, RN Outcome: Adequate for Discharge 09/15/2021 1329 by Zadie Rhine, RN Outcome: Progressing   Problem: Health Behavior/Discharge Planning: Goal: Ability to manage health-related needs will improve 09/15/2021 1339 by Zadie Rhine, RN Outcome: Adequate for Discharge 09/15/2021 1329 by Zadie Rhine, RN Outcome: Progressing   Problem: Clinical Measurements: Goal: Ability to maintain clinical measurements within normal limits will improve 09/15/2021 1339 by Zadie Rhine, RN Outcome: Adequate for Discharge 09/15/2021 1329 by Zadie Rhine, RN Outcome: Progressing Goal: Will remain free from infection 09/15/2021 1339 by Zadie Rhine, RN Outcome: Adequate for Discharge 09/15/2021 1329 by Zadie Rhine, RN Outcome: Progressing Goal: Diagnostic test results will improve 09/15/2021 1339 by Zadie Rhine, RN Outcome: Adequate for Discharge 09/15/2021 1329 by Zadie Rhine, RN Outcome: Progressing Goal: Respiratory complications will improve Outcome: Adequate for Discharge Goal: Cardiovascular complication will be avoided Outcome: Adequate for Discharge   Problem: Activity: Goal: Risk for activity intolerance will decrease Outcome: Adequate for Discharge   Problem: Coping: Goal: Level of anxiety will decrease Outcome: Adequate for Discharge   Problem: Elimination: Goal: Will not experience complications related to bowel motility Outcome: Adequate for Discharge   Problem: Pain Managment: Goal: General experience of comfort will improve Outcome: Adequate for Discharge   Problem: Safety: Goal: Ability to remain free from injury will improve Outcome: Adequate for Discharge   Problem: Skin Integrity: Goal: Risk for  impaired skin integrity will decrease Outcome: Adequate for Discharge   Problem: Increased Nutrient Needs (NI-5.1) Goal: Food and/or nutrient delivery Description: Individualized approach for food/nutrient provision. Outcome: Adequate for Discharge   Problem: Acute Rehab PT Goals(only PT should resolve) Goal: Pt Will Go Supine/Side To Sit Outcome: Adequate for Discharge Goal: Pt Will Go Sit To Supine/Side Outcome: Adequate for Discharge Goal: Patient Will Transfer Sit To/From Stand Outcome: Adequate for Discharge Goal: Pt Will Transfer Bed To Chair/Chair To Bed Outcome: Adequate for Discharge Goal: Pt Will Ambulate Outcome: Adequate for Discharge   Problem: Acute Rehab OT Goals (only OT should resolve) Goal: Pt. Will Perform Lower Body Dressing Outcome: Adequate for Discharge Goal: Pt. Will Transfer To Toilet Outcome: Adequate for Discharge Goal: Pt. Will Perform Toileting-Clothing Manipulation Outcome: Adequate for Discharge   Problem: Fluid Volume: Goal: Hemodynamic stability will improve Outcome: Adequate for Discharge   Problem: Clinical Measurements: Goal: Diagnostic test results will improve Outcome: Adequate for Discharge Goal: Signs and symptoms of infection will decrease Outcome: Adequate for Discharge

## 2021-09-15 NOTE — Care Management Important Message (Signed)
Important Message  Patient Details IM Letter given to the Patient. Name: MELODIE ASHWORTH MRN: [id number] Date of Birth: 01-08-1956   Medicare Important Message Given:  Yes     Caren Macadam 09/15/2021, 12:21 PM

## 2021-09-15 NOTE — Progress Notes (Signed)
Report called to Rodney Booze, LPN from [facility] [phone], EMS dispatched by Care Management. Pt aware of transport. SRP, RN

## 2021-09-15 NOTE — TOC Transition Note (Signed)
Transition of Care [facility]) - CM/SW Discharge Note   Patient Details  Name: [name] MRN: [id number] Date of Birth: 17-Jun-1956  Transition [facility]) CM/SW Contact:  Armanda Heritage, RN Phone Number: 09/15/2021, 3:15 PM   Clinical Narrative:    Patient has a bed offer from [facility] and bed is available today.  Patient to discharge to room 201A, to be transported by [facility].  DC summary sent to admissions rep Malena Peer.     Barriers to Discharge: Continued Medical Work up   Patient Goals and CMS Choice Patient states their goals for this hospitalization and ongoing recovery are:: home CMS Medicare.gov Compare Post Acute Care list provided to:: Patient    Discharge Placement                       Discharge Plan and Services   Discharge Planning Services: CM Consult Post Acute Care Choice: Resumption of Svcs/PTA Provider                               Social Determinants of Health (SDOH) Interventions     Readmission Risk Interventions Readmission Risk Prevention Plan 09/08/2021  Transportation Screening Complete  Medication Review (RN Care Manager) Complete  PCP or Specialist appointment within 3-5 days of discharge Complete  HRI or Home Care Consult Complete  SW Recovery Care/Counseling Consult Complete  Palliative Care Screening Not Applicable  Skilled Nursing Facility Complete  Some recent data might be hidden

## 2021-09-15 NOTE — Progress Notes (Signed)
PROGRESS NOTE    [name]  [id number] DOB: Apr 07, 1956 DOA: 09/05/2021 PCP: Crist Infante, MD    Brief Narrative:  [name] is a 66 y.o. female with medical history significant for COPD, depression, hypertension, hypothyroidism, who presented to the emergency department with feeling of not feeling well.  Of note patient was recently discharged from [facility] after being treated for septic shock secondary to right lower extremity cellulitis and had completed 10-day course of antibiotic.  General surgery did not recommend any surgical intervention.  Patient also required midodrine and was taken off Florinef.  After discharge from the hospital, patient continued to report worsening pain in the leg.  This is her third hospitalization for the same problem.  On previous admission, patient had refused skilled nursing facility placement.  In the ED, temperature was 101.6 F, was tachycardic and tachypneic.  Blood pressure was low which improved with 2 L of IV fluid bolus and patient was given IV Levophed.  Femur x-ray showed large defect in the soft tissue with soft tissue edema.  Patient was started on IV vancomycin cefepime metronidazole and was admitted to hospital for further evaluation and treatment.  Infectious disease was consulted during hospitalization for antibiotic recommendation and was subsequently switched to oral antibiotic which the patient has completed.  At this time, patient is awaiting for disposition to skilled nursing facility and is medically stable for disposition.   Assessment and Plan: * Cellulitis of right lower extremity- (present on admission) Seen by infectious disease during hospitalization.  Completed course of antibiotic with doxycycline and Augmentin until 09/14/2021.   Afebrile at this time.  No leukocytosis.  Blood cultures negative.  Septic shock ([place])- (present on admission) Resolved. Blood cultures and urine cultures been negative.  Completed IV  followed by oral antibiotic  Chronic hypotension- (present on admission) Patient was initiated on midodrine on last admission.  Continue midodrine.  Opioid dependence ([place])- (present on admission) Morphine especially during dressing changes.  Continue oral  Percocet  COPD (chronic obstructive pulmonary disease) ([place])- (present on admission) Continue bronchodilators, oxygen.    Appears compensated at this time  Home Infestation by bed bug- (present on admission) TOC on board for safe disposition.  Edentulous- (present on admission) Continue dysphagia 3 diet.  Leukocytosis- (present on admission) Resolved at this time.    Hyponatremia- (present on admission) Mild.  Latest sodium of 131.  Received IV hydration.  Noncompliance Third admission for the same.  Unable to care for self at home.   Awaiting skilled nursing facility placement  Anemia, chronic disease- (present on admission) Latest hemoglobin of 9.5 from 8.8< 8.9< 8.2 <6.3.  Received 1 unit of packed RBC during hospitalization  Hypokalemia- (present on admission) Improved after replacement.  Latest potassium of 3.8  Open leg wound, right, sequela No need for surgical debridement as per surgery.    Continue wound care.   Pressure injury of skin- (present on admission)  Continue air mattress overlay at all times  Adult failure to thrive- (present on admission) Multifactorial.  Encourage oral nutrition.  Anxiety- (present on admission) Continue Xanax for now.  HLD (hyperlipidemia)- (present on admission) Continue atorvastatin  Hypothyroidism- (present on admission) Continue Synthroid  Depression- (present on admission) Continue Effexor and Wellbutrin  Sleep apnea- (present on admission) Continue nightly CPAP  GERD (gastroesophageal reflux disease)- (present on admission) Continue Protonix and sucralfate.    DVT prophylaxis: heparin injection 5,000 Units Start: 09/05/21 2200   Code Status:  Code  Status: DNR  Disposition: Skilled nursing facility.  Medically stable for disposition  Status is: Inpatient  Remains inpatient appropriate because: Awaiting for skilled nursing of facility placement,   Family Communication:    Spoke with the patient at bedside.  Spoke with the patient's contact few days back.  Consultants:  Infectious disease General surgery  Procedures:  Wound care  Antimicrobials:  Doxycycline and amoxicillin until 2/13  Subjective: Today, patient was seen and examined at bedside.  Patient denies any nausea vomiting fever or chills today.   Objective: Vitals:   09/14/21 2140 09/15/21 0618 09/15/21 0800 09/15/21 0829  BP: (!) 109/50 (!) 110/50 (!) 104/57   Pulse: 75 87    Resp: 18 18 16    Temp: 98.4 F (36.9 C) 98.3 F (36.8 C) 98.2 F (36.8 C)   TempSrc: Oral Oral Oral   SpO2: 96% 97% 99% 92%  Weight:      Height:        Intake/Output Summary (Last 24 hours) at 09/15/2021 1151 Last data filed at 09/15/2021 0959 Gross per 24 hour  Intake 832 ml  Output 3950 ml  Net -3118 ml   Filed Weights   09/05/21 1038 09/05/21 1857  Weight: 94.3 kg 93.4 kg   Body mass index is 34.28 kg/m.   Physical Examination: General: Obese built, not in obvious distress HENT:   No scleral pallor or icterus noted. Oral mucosa is moist.  Chest:  Clear breath sounds.  Diminished breath sounds bilaterally. No crackles or wheezes.  CVS: S1 &S2 heard. No murmur.  Regular rate and rhythm. Abdomen: Soft, nontender, nondistended.  Bowel sounds are heard.   Extremities: No cyanosis, clubbing or edema.  Right upper thigh wound with dressing, right heel wound Psych: Alert, awake and oriented, normal mood CNS:  No cranial nerve deficits.  Power equal in all extremities.   Skin: Warm and dry.  Right thigh and heel wound  Data Reviewed:   CBC: Recent Labs  Lab 09/09/21 0408 09/10/21 0345 09/11/21 0356 09/15/21 0417  WBC 8.9 9.0 10.1 9.9  NEUTROABS 3.6  --   --   --    HGB 8.8* 8.9* 8.8* 9.5*  HCT 30.3* 30.7* 30.5* 32.8*  MCV 89.1 87.7 88.9 87.5  PLT 441* 499* 512* 619*    Basic Metabolic Panel: Recent Labs  Lab 09/09/21 0408 09/10/21 0345 09/11/21 0356 09/15/21 0417  NA 136 134* 133* 131*  K 4.1 3.9 3.9 3.8  CL 99 98 98 95*  CO2 32 31 28 27   GLUCOSE 99 96 90 102*  BUN 17 16 14 13   CREATININE 0.51 0.48 0.59 0.73  CALCIUM 8.8* 8.6* 8.3* 9.1  MG 2.1  --  2.1 2.1    Liver Function Tests: Recent Labs  Lab 09/09/21 0408  AST 15  ALT 15  ALKPHOS 78  BILITOT 0.3  PROT 5.4*  ALBUMIN 2.4*     Radiology Studies: No results found.    LOS: 10 days    Flora Lipps, MD [facility] 09/15/2021, 11:51 AM

## 2021-09-15 NOTE — Discharge Summary (Addendum)
Physician Discharge Summary   Patient: [name] MRN: [id number] DOB: 11/16/1955  Admit date:     09/05/2021  Discharge date: 09/15/21  Discharge Physician: Flora Lipps   PCP: Crist Infante, MD   Recommendations at discharge:   Follow-up with your primary care provider at the skilled nursing facility 3 to 5 days.  Check blood work at that time. Continue wound care at the facility.  Discharge Diagnoses: Principal Problem:   Cellulitis of right lower extremity Active Problems:   Septic shock (HCC)   GERD (gastroesophageal reflux disease)   Sleep apnea   Depression   Hypothyroidism   HLD (hyperlipidemia)   Anxiety   Adult failure to thrive   Pressure injury of skin   Open leg wound, right, sequela   Hypokalemia   Anemia, chronic disease   Noncompliance   Hyponatremia   Leukocytosis   Edentulous   Home Infestation by bed bug   COPD (chronic obstructive pulmonary disease) (HCC)   Opioid dependence (HCC)   Chronic hypotension  Resolved Problems:   * No resolved hospital problems. *   Hospital Course: [name] is a 66 y.o. female with medical history significant for COPD, depression, hypertension, hypothyroidism, who presented to the emergency department with feeling of not feeling well.  Of note patient was recently discharged from [facility] hospital after being treated for septic shock secondary to right lower extremity cellulitis and had completed 10-day course of antibiotic.  General surgery did not recommend any surgical intervention.  Patient also required midodrine and was taken off Florinef.  After discharge from the hospital, patient continued to report worsening pain in the leg.  This is her third hospitalization for the same problem.  On previous admission, patient had refused skilled nursing facility placement.  In the ED, temperature was 101.6 F, was tachycardic and tachypneic.  Blood pressure was low which improved with 2 L of IV fluid bolus and patient  was given IV Levophed.  Femur x-ray showed large defect in the soft tissue with soft tissue edema.  Patient was started on IV vancomycin cefepime metronidazole and was admitted to hospital for further evaluation and treatment.  Infectious disease was consulted during hospitalization for antibiotic recommendation and was subsequently switched to oral antibiotic which the patient has completed.  At this time, patient is awaiting for disposition to skilled nursing facility and is medically stable for disposition.  Assessment and Plan: * Cellulitis of right lower extremity- (present on admission) Seen by infectious disease during hospitalization.  Completed course of antibiotic with doxycycline and Augmentin until 09/14/2021.   Afebrile at this time.  No leukocytosis.  Blood cultures negative.  Septic shock ([place])- (present on admission) Resolved. Blood cultures and urine cultures been negative.  Completed IV followed by oral antibiotic  Chronic hypotension- (present on admission) Patient was initiated on midodrine on last admission.  Continue midodrine.  Opioid dependence ([place])- (present on admission) Received morphine especially during dressing changes.  Continue oral  Percocet  COPD (chronic obstructive pulmonary disease) ([place])- (present on admission) Continue bronchodilators, oxygen.    Appears compensated at this time  Home Infestation by bed bug- (present on admission) TOC on board for safe disposition.  Edentulous- (present on admission) Recommend soft diet.  Was on dysphagia 3 diet.  Leukocytosis- (present on admission) Resolved at this time.    Hyponatremia- (present on admission) Mild.  Latest sodium of 131.  Received IV hydration.  Noncompliance Third admission for the same.  Unable to care for  self at home.   Awaiting skilled nursing facility placement  Anemia, chronic disease- (present on admission) Latest hemoglobin of 9.5 from 8.8< 8.9< 8.2 <6.3.  Received 1 unit of packed  RBC during hospitalization  Hypokalemia- (present on admission) Improved after replacement.  Latest potassium of 3.8  Open right thigh wound, sequela    Continue wound care. Right posterior thigh: 21cm x 14cm x 4cm red, moist with irregular wound bed.    Pressure injury of skin- (present on admission)  Continue air mattress overlay at all times, continue wound care on discharge. Pressure Injury 08/15/21 Buttocks Right;Posterior;Medial Stage 2 -  Partial thickness loss of dermis presenting as a shallow open injury with a red, pink wound bed without slough. (Active)  08/15/21 1200  Location: Buttocks  Location Orientation: Right;Posterior;Medial  Staging: Stage 2 -  Partial thickness loss of dermis presenting as a shallow open injury with a red, pink wound bed without slough.  Wound Description (Comments):   Present on Admission: Yes     Pressure Injury 08/15/21 Buttocks Left;Medial Stage 2 -  Partial thickness loss of dermis presenting as a shallow open injury with a red, pink wound bed without slough. (Active)  08/15/21 1200  Location: Buttocks  Location Orientation: Left;Medial  Staging: Stage 2 -  Partial thickness loss of dermis presenting as a shallow open injury with a red, pink wound bed without slough.  Wound Description (Comments):   Present on Admission: Yes    Adult failure to thrive- (present on admission) Multifactorial.  Encourage oral nutrition.  Anxiety- (present on admission) Continue Xanax for now.  HLD (hyperlipidemia)- (present on admission) Continue atorvastatin  Hypothyroidism- (present on admission) Continue Synthroid  Depression- (present on admission) Continue Effexor and Wellbutrin  Sleep apnea- (present on admission) Continue nightly CPAP  GERD (gastroesophageal reflux disease)- (present on admission) Continue Protonix and sucralfate.  Consultants:  Infectious disease General surgery Procedures performed: Wound care Disposition: Skilled  nursing facility Diet recommendation:  Discharge Diet Orders (From admission, onward)     Start     Ordered   09/15/21 0000  Diet general       Comments: Soft diet.   09/15/21 1328           Regular diet  DISCHARGE MEDICATION: Allergies as of 09/15/2021       Reactions   Gemfibrozil    Other reaction(s): Sick to stomach   Nitroglycerin    Other reaction(s): Cough, Irritation   Other Other (See Comments)   Grape fruit juice - unknown   Pravastatin    Other reaction(s): Dizzy   Sulfamethoxazole-trimethoprim    Other reaction(s): N/V   Tyloxapol    Other reaction(s): Does not work        Medication List     STOP taking these medications    esomeprazole 40 MG capsule Commonly known as: NEXIUM   hydrocortisone 5 MG tablet Commonly known as: CORTEF       TAKE these medications    acetaminophen 325 MG tablet Commonly known as: TYLENOL Take 2 tablets (650 mg total) by mouth every 6 (six) hours as needed for mild pain (or Fever >/= 101).   ALPRAZolam 1 MG tablet Commonly known as: XANAX Take 1 tablet (1 mg total) by mouth 3 (three) times daily.   atorvastatin 10 MG tablet Commonly known as: LIPITOR Take 10 mg by mouth daily.   buPROPion 150 MG 24 hr tablet Commonly known as: WELLBUTRIN XL Take 150 mg by mouth every evening.  feeding supplement Liqd Take 237 mLs by mouth daily.   nutrition supplement (JUVEN) Pack Take 1 packet by mouth daily.   gabapentin 300 MG capsule Commonly known as: NEURONTIN Take 300 mg by mouth 3 (three) times daily.   hydrocortisone cream 0.5 % Apply topically 3 (three) times daily as needed for itching.   ipratropium-albuterol 0.5-2.5 (3) MG/3ML Soln Commonly known as: DUONEB use 1 vial by nebulization every 4 (four) hours as needed. What changed: reasons to take this   levothyroxine 150 MCG tablet Commonly known as: SYNTHROID Take 150 mcg by mouth daily before breakfast.   lidocaine 2 % solution Commonly  known as: XYLOCAINE Use as directed 15 mLs in the mouth or throat as needed for mouth pain.   liver oil-zinc oxide 40 % ointment Commonly known as: DESITIN Apply topically 4 (four) times daily.   midodrine 5 MG tablet Commonly known as: PROAMATINE Take 3 tablets (15 mg total) by mouth 3 (three) times daily with meals.   multivitamin with minerals Tabs tablet Take 1 tablet by mouth daily.   ondansetron 4 MG tablet Commonly known as: ZOFRAN Take 1 tablet (4 mg total) by mouth every 6 (six) hours as needed for nausea.   oxyCODONE-acetaminophen 5-325 MG tablet Commonly known as: PERCOCET/ROXICET Take 1 tablet by mouth in the morning, at noon, and at bedtime.   pantoprazole 40 MG tablet Commonly known as: PROTONIX Take 1 tablet (40 mg total) by mouth 2 (two) times daily before a meal for 14 days, THEN 1 tablet (40 mg total) daily for 14 days. Start taking on: September 15, 2021   polyethylene glycol 17 g packet Commonly known as: MIRALAX / GLYCOLAX Take 17 g by mouth 2 (two) times daily. Decreased to once daily if having diarrhea.   sucralfate 1 g tablet Commonly known as: CARAFATE Take 1 tablet (1 g total) by mouth 4 (four) times daily -  with meals and at bedtime for 14 days.   Symbicort 160-4.5 MCG/ACT inhaler Generic drug: budesonide-formoterol Inhale 2 puffs into the lungs 3 (three) times daily.   venlafaxine XR 150 MG 24 hr capsule Commonly known as: EFFEXOR-XR Take 150 mg by mouth at bedtime.               Discharge Care Instructions  (From admission, onward)           Start     Ordered   09/15/21 0000  Discharge wound care:       Comments: Wound care to right posterior thigh:  Cleanse with Dakin's solution and pat dry. Apply Dakin's solution moistened gauze to wound, use cotton tipped applicator to tuck into areas of depth. Do not apply Dakin's solution moistened gauze to intact skin. Top with dry gauze, cover with ABD pad and secure with tape. Change  twice daily.     Wound care to right heel: Cleanse with NS, pat dry. Cover with silver hydrofiber (Aquacel Ag+ Advantage, [employer] # [id number]), top with dry gauze and secure with Kerlix roll gauze/paper tape. Place foot into Prevalon boot.   09/15/21 1328           Subjective: Today, patient was seen and examined at bedside.  Patient denies any nausea vomiting fever or chills today.   Discharge Exam: Filed Weights   09/05/21 1038 09/05/21 1857  Weight: 94.3 kg 93.4 kg   General: Obese built, not in obvious distress HENT:   No scleral pallor or icterus noted. Oral mucosa is moist.  Chest:  Clear breath sounds.  Diminished breath sounds bilaterally. No crackles or wheezes.  CVS: S1 &S2 heard. No murmur.  Regular rate and rhythm. Abdomen: Soft, nontender, nondistended.  Bowel sounds are heard.   Extremities: No cyanosis, clubbing or edema.  Right upper thigh wound with dressing, right heel wound Psych: Alert, awake and oriented, normal mood CNS:  No cranial nerve deficits.  Power equal in all extremities.   Skin: Warm and dry.  Right thigh and heel wound  Condition at discharge: good  The results of significant diagnostics from this hospitalization (including imaging, microbiology, ancillary and laboratory) are listed below for reference.   Imaging Studies: DG Ankle Complete Right  Result Date: 09/07/2021 CLINICAL DATA:  Right ankle wound. EXAM: RIGHT ANKLE - COMPLETE 3+ VIEW COMPARISON:  None. FINDINGS: There is no evidence of fracture, dislocation, or joint effusion. There is no evidence of arthropathy or other focal bone abnormality. Soft tissues are unremarkable. IMPRESSION: Negative. Electronically Signed   By: Marijo Conception M.D.   On: 09/07/2021 14:58   CT FEMUR RIGHT W CONTRAST  Result Date: 09/05/2021 CLINICAL DATA:  Right posterior leg wound. EXAM: CT OF THE LOWER RIGHT EXTREMITY WITH CONTRAST TECHNIQUE: Multidetector CT imaging of the lower right extremity was performed  according to the standard protocol following intravenous contrast administration. RADIATION DOSE REDUCTION: This exam was performed according to the departmental dose-optimization program which includes automated exposure control, adjustment of the mA and/or kV according to patient size and/or use of iterative reconstruction technique. CONTRAST:  185mL OMNIPAQUE IOHEXOL 300 MG/ML  SOLN COMPARISON:  08/15/2021 FINDINGS: Bones/Joint/Cartilage No acute fracture or dislocation. Mild osteoarthritis of the right SI joint. Mild osteoarthritis of the right hip. Severe tricompartmental osteoarthritis of the right knee with numerous loose bodies and bulky marginal osteophytes. Small right knee joint effusion. Normal alignment. No periosteal reaction or bone destruction. Ligaments Ligaments are suboptimally evaluated by CT. Muscles, Tendons and Soft tissue 4.1 x 3.4 x 3 cm soft tissue wound along the posterior upper thigh extending to the lateral margin of the flexor compartment muscles. Second slightly inferiorly located 4 x 3.1 x 3.6 cm soft tissue wound involving the posterior upper thigh with the wound extending superficial to the inferior peripheral aspect of the gluteus maximus muscle. Posteromedially in the upper thigh there is a 2.7 x 3.6 x 4.7 cm soft tissue wound. Soft tissue edema and skin thickening of the subcutaneous fat along the thigh consistent with cellulitis extending into the lower leg. No focal fluid collection to suggest an abscess. IMPRESSION: 1. Three soft tissue wounds in the right posterior thigh as described above. 2. Cellulitis of the right thigh and lower leg. 3. No evidence of osteomyelitis. No drainable fluid collection to suggest an abscess. 4. Severe tricompartmental osteoarthritis of the right knee with numerous loose bodies and bulky marginal osteophytes. Electronically Signed   By: Kathreen Devoid M.D.   On: 09/05/2021 17:39   DG Chest Port 1 View  Result Date: 09/05/2021 CLINICAL DATA:   66 year old female with possible sepsis. Wound infection. EXAM: PORTABLE CHEST 1 VIEW COMPARISON:  Portable chest 08/14/2021 and earlier. FINDINGS: Portable AP semi upright view at 1127 hours. Lung volumes and mediastinal contours remain normal. Visualized tracheal air column is within normal limits. Allowing for portable technique the lungs are clear. No pneumothorax or pleural effusion. Degenerative appearing superior subluxation right glenohumeral joint. No acute osseous abnormality identified. Negative visible bowel gas. IMPRESSION: Negative portable chest. Electronically Signed   By: Genevie Ann  M.D.   On: 09/05/2021 11:51   DG Abd Portable 1V  Result Date: 08/25/2021 CLINICAL DATA:  Abdominal pain EXAM: PORTABLE ABDOMEN - 1 VIEW COMPARISON:  CT done on 06/16/2021 FINDINGS: Bowel gas pattern is nonspecific. There is no significant dilation of small-bowel loops. Stomach is not distended. Moderate to large amount of stool is present in colon. Severe degenerative changes are noted in the lower thoracic spine and lumbar spine. Surgical clips are seen in the right upper quadrant and right side of pelvis. IMPRESSION: Nonspecific bowel gas pattern. Moderate to large amount of stool is present in colon. Severe degenerative changes are noted in the lumbar spine. Electronically Signed   By: Elmer Picker M.D.   On: 08/25/2021 11:57   DG Foot Complete Right  Result Date: 09/07/2021 CLINICAL DATA:  Right foot wound. EXAM: RIGHT FOOT COMPLETE - 3+ VIEW COMPARISON:  None. FINDINGS: There is no evidence of fracture or dislocation. Extensive osteophyte formation is seen involving the superior portion of the intertarsal and tarsometatarsal joints. Soft tissues are unremarkable. IMPRESSION: Degenerative changes as described above. No acute abnormality is noted. Electronically Signed   By: Marijo Conception M.D.   On: 09/07/2021 15:00   DG Femur Min 2 Views Right  Result Date: 09/05/2021 CLINICAL DATA:  Skin and soft  tissue infection involving the posterior right leg. EXAM: RIGHT FEMUR 2 VIEWS COMPARISON:  08/15/21 FINDINGS: There is diffuse soft tissue edema. Large defect within the soft tissues overlying the posterior right lower extremity is identified. No underlying bony erosion. No acute fracture or dislocation. Moderate degenerative changes are noted involving the right hip. Severe tricompartment osteoarthritis is noted involving the right knee. IMPRESSION: 1. Large defect within the soft tissues overlying the posterior right lower extremity. No underlying bony erosion. 2. Diffuse soft tissue edema. 3. Severe tricompartment osteoarthritis of the right knee. Electronically Signed   By: Kerby Moors M.D.   On: 09/05/2021 14:12   VAS [place] UPPER EXTREMITY VENOUS DUPLEX  Result Date: 08/28/2021 UPPER VENOUS STUDY  Patient Name:  AERYN IBRAHIM  Date of Exam:   08/27/2021 Medical Rec #: [id number]      Accession #:    [id number] Date of Birth: [birth year]      Patient Gender: F Patient Age:   72 years Exam Location:  [facility] Procedure:      VAS [place] UPPER EXTREMITY VENOUS DUPLEX Referring Phys: STEPHEN CHIU --------------------------------------------------------------------------------  Indications: Edema Limitations: Line and bandages. Comparison Study: No previous exams Performing Technologist: Jody Hill RVT, RDMS  Examination Guidelines: A complete evaluation includes B-mode imaging, spectral Doppler, color Doppler, and power Doppler as needed of all accessible portions of each vessel. Bilateral testing is considered an integral part of a complete examination. Limited examinations for reoccurring indications may be performed as noted.  Right Findings: +----------+------------+---------+-----------+----------+-------+  RIGHT      Compressible Phasicity Spontaneous Properties Summary  +----------+------------+---------+-----------+----------+-------+  IJV            Full        Yes        Yes                          +----------+------------+---------+-----------+----------+-------+  Subclavian     Full        Yes        Yes                         +----------+------------+---------+-----------+----------+-------+  Axillary       Full        Yes        Yes                         +----------+------------+---------+-----------+----------+-------+  Brachial       Full        Yes        Yes                         +----------+------------+---------+-----------+----------+-------+  Radial         Full                                               +----------+------------+---------+-----------+----------+-------+  Ulnar          Full                                               +----------+------------+---------+-----------+----------+-------+  Cephalic       Full                                               +----------+------------+---------+-----------+----------+-------+  Basilic        Full        Yes        Yes                         +----------+------------+---------+-----------+----------+-------+ Brachial and basilic veins were difficult to visualize due to PICC line and bandage. Only one of paired brachial veins seen on this exam.  Left Findings: +----------+------------+---------+-----------+----------+-------+  LEFT       Compressible Phasicity Spontaneous Properties Summary  +----------+------------+---------+-----------+----------+-------+  Subclavian     Full        Yes        Yes                         +----------+------------+---------+-----------+----------+-------+  Summary:  Right: No evidence of deep vein thrombosis in the upper extremity. No evidence of superficial vein thrombosis in the upper extremity.  Left: No evidence of thrombosis in the subclavian.  *See table(s) above for measurements and observations.  Diagnosing physician: Jamelle Haring Electronically signed by Jamelle Haring on 08/28/2021 at 5:06:03 AM.    Final     Microbiology: Results for orders placed or performed during the hospital encounter  of 09/05/21  Blood Culture (routine x 2)     Status: None   Collection Time: 09/05/21 10:41 AM   Specimen: Right Antecubital; Blood  Result Value Ref Range Status   Specimen Description   Final    RIGHT ANTECUBITAL BOTTLES DRAWN AEROBIC AND ANAEROBIC   Special Requests Blood Culture adequate volume  Final   Culture   Final    NO GROWTH 5 DAYS Performed at [facility], [address] [place], [place] [address]    Report Status 09/10/2021 FINAL  Final  Urine Culture     Status: Abnormal   Collection Time: 09/05/21 10:41 AM  Specimen: Urine, Clean Catch  Result Value Ref Range Status   Specimen Description   Final    URINE, CLEAN CATCH Performed at [facility], [address], [place], [place] [address]    Special Requests   Final    URINE, CLEAN CATCH Performed at [facility], [address], [place], [place] [address]    Culture (A)  Final    <10,000 COLONIES/mL INSIGNIFICANT GROWTH Performed at [facility], [address], [place], [place] [address]    Report Status 09/07/2021 FINAL  Final  Resp Panel by RT-PCR (Flu A&B, Covid) Nasopharyngeal Swab     Status: None   Collection Time: 09/05/21 10:42 AM   Specimen: Nasopharyngeal Swab; Nasopharyngeal(NP) swabs in vial transport medium  Result Value Ref Range Status   SARS Coronavirus 2 by RT PCR NEGATIVE NEGATIVE Final    Comment: (NOTE) SARS-CoV-2 target nucleic acids are NOT DETECTED.  The SARS-CoV-2 RNA is generally detectable in upper respiratory specimens during the acute phase of infection. The lowest concentration of SARS-CoV-2 viral copies this assay can detect is 138 copies/mL. A negative result does not preclude SARS-Cov-2 infection and should not be used as the sole basis for treatment or other patient management decisions. A negative result may occur with  improper specimen collection/handling, submission of specimen other than nasopharyngeal swab, presence of viral mutation(s) within the areas targeted  by this assay, and inadequate number of viral copies(<138 copies/mL). A negative result must be combined with clinical observations, patient history, and epidemiological information. The expected result is Negative.  Fact Sheet for Patients:  BloggerCourse.com  Fact Sheet for Healthcare Providers:  SeriousBroker.it  This test is no t yet approved or cleared by the [place] FDA and  has been authorized for detection and/or diagnosis of SARS-CoV-2 by FDA under an Emergency Use Authorization (EUA). This EUA will remain  in effect (meaning this test can be used) for the duration of the COVID-19 declaration under Section 564(b)(1) of the Act, 21 U.S.C.section 360bbb-3(b)(1), unless the authorization is terminated  or revoked sooner.       Influenza A by PCR NEGATIVE NEGATIVE Final   Influenza B by PCR NEGATIVE NEGATIVE Final    Comment: (NOTE) The Xpert Xpress SARS-CoV-2/FLU/RSV plus assay is intended as an aid in the diagnosis of influenza from Nasopharyngeal swab specimens and should not be used as a sole basis for treatment. Nasal washings and aspirates are unacceptable for Xpert Xpress SARS-CoV-2/FLU/RSV testing.  Fact Sheet for Patients: BloggerCourse.com  Fact Sheet for Healthcare Providers: SeriousBroker.it  This test is not yet approved or cleared by the [place] FDA and has been authorized for detection and/or diagnosis of SARS-CoV-2 by FDA under an Emergency Use Authorization (EUA). This EUA will remain in effect (meaning this test can be used) for the duration of the COVID-19 declaration under Section 564(b)(1) of the Act, 21 U.S.C. section 360bbb-3(b)(1), unless the authorization is terminated or revoked.  Performed at [facility], [address] [place], [place] [address]   Blood Culture (routine x 2)     Status: None   Collection Time: 09/05/21 11:11 AM    Specimen: Left Antecubital; Blood  Result Value Ref Range Status   Specimen Description   Final    LEFT ANTECUBITAL BOTTLES DRAWN AEROBIC AND ANAEROBIC   Special Requests Blood Culture adequate volume  Final   Culture   Final    NO GROWTH 5 DAYS Performed at [facility], [address] [place], [place]  [id number]    Report Status 09/10/2021 FINAL  Final    Labs: CBC: Recent Labs  Lab 09/09/21 0408 09/10/21 0345 09/11/21 0356 09/15/21 0417  WBC 8.9 9.0 10.1 9.9  NEUTROABS 3.6  --   --   --   HGB 8.8* 8.9* 8.8* 9.5*  HCT 30.3* 30.7* 30.5* 32.8*  MCV 89.1 87.7 88.9 87.5  PLT 441* 499* 512* [id number]*   Basic Metabolic Panel: Recent Labs  Lab 09/09/21 0408 09/10/21 0345 09/11/21 0356 09/15/21 0417  NA 136 134* 133* 131*  K 4.1 3.9 3.9 3.8  CL 99 98 98 95*  CO2 32 31 28 27   GLUCOSE 99 96 90 102*  BUN 17 16 14 13   CREATININE 0.51 0.48 0.59 0.73  CALCIUM 8.8* 8.6* 8.3* 9.1  MG 2.1  --  2.1 2.1   Liver Function Tests: Recent Labs  Lab 09/09/21 0408  AST 15  ALT 15  ALKPHOS 78  BILITOT 0.3  PROT 5.4*  ALBUMIN 2.4*   CBG: Recent Labs  Lab 09/13/21 1558  GLUCAP 138*    Discharge time spent: greater than 30 minutes.  Signed: Flora Lipps, MD [facility] 09/15/2021

## 2021-09-15 NOTE — Plan of Care (Signed)

## 2021-09-15 NOTE — Progress Notes (Signed)
PT Cancellation Note  Patient Details Name: [name] MRN: [id number] DOB: 1955/09/06   Cancelled Treatment:    Reason Eval/Treat Not Completed: Other (comment)waiting on lunch and to have dressing changed. Will check back another time. Blanchard Kelch PT Acute Rehabilitation Services Pager [phone] Office [phone]    Rada Hay 09/15/2021, 12:14 PM

## 2021-10-02 ENCOUNTER — Inpatient Hospital Stay: Payer: Medicare Other | Admitting: Internal Medicine

## 2022-01-22 ENCOUNTER — Emergency Department (HOSPITAL_COMMUNITY)
Admission: EM | Admit: 2022-01-22 | Discharge: 2022-01-23 | Disposition: A | Discharge status: Home / Self Care | Payer: Medicare Other | Attending: Emergency Medicine | Admitting: Emergency Medicine

## 2022-01-22 ENCOUNTER — Encounter (HOSPITAL_COMMUNITY): Payer: Self-pay | Admitting: Emergency Medicine

## 2022-01-22 ENCOUNTER — Other Ambulatory Visit: Payer: Self-pay

## 2022-01-22 DIAGNOSIS — Z7951 Long term (current) use of inhaled steroids: Secondary | ICD-10-CM | POA: Insufficient documentation

## 2022-01-22 DIAGNOSIS — R5381 Other malaise: Secondary | ICD-10-CM | POA: Diagnosis not present

## 2022-01-22 DIAGNOSIS — E876 Hypokalemia: Secondary | ICD-10-CM | POA: Diagnosis not present

## 2022-01-22 DIAGNOSIS — J449 Chronic obstructive pulmonary disease, unspecified: Secondary | ICD-10-CM | POA: Diagnosis not present

## 2022-01-22 DIAGNOSIS — R531 Weakness: Secondary | ICD-10-CM | POA: Insufficient documentation

## 2022-01-22 LAB — URINALYSIS, ROUTINE W REFLEX MICROSCOPIC
Bilirubin Urine: NEGATIVE
Glucose, UA: NEGATIVE mg/dL
Hgb urine dipstick: NEGATIVE
Ketones, ur: NEGATIVE mg/dL
Leukocytes,Ua: NEGATIVE
Nitrite: POSITIVE — AB
Protein, ur: NEGATIVE mg/dL
Specific Gravity, Urine: 1.003 — ABNORMAL LOW (ref 1.005–1.030)
pH: 7 (ref 5.0–8.0)

## 2022-01-22 LAB — COMPREHENSIVE METABOLIC PANEL
ALT: 12 U/L (ref 0–44)
AST: 18 U/L (ref 15–41)
Albumin: 3.9 g/dL (ref 3.5–5.0)
Alkaline Phosphatase: 142 U/L — ABNORMAL HIGH (ref 38–126)
Anion gap: 10 (ref 5–15)
BUN: 8 mg/dL (ref 8–23)
CO2: 25 mmol/L (ref 22–32)
Calcium: 9.3 mg/dL (ref 8.9–10.3)
Chloride: 103 mmol/L (ref 98–111)
Creatinine, Ser: 0.75 mg/dL (ref 0.44–1.00)
GFR, Estimated: >60 mL/min (ref >60)
Glucose, Bld: 101 mg/dL — ABNORMAL HIGH (ref 70–99)
Potassium: 2.9 mmol/L — ABNORMAL LOW (ref 3.5–5.1)
Sodium: 138 mmol/L (ref 135–145)
Total Bilirubin: 0.6 mg/dL (ref 0.3–1.2)
Total Protein: 7.7 g/dL (ref 6.5–8.1)

## 2022-01-22 LAB — CBC WITH DIFFERENTIAL/PLATELET
Abs Immature Granulocytes: 0.03 10*3/uL (ref 0.00–0.07)
Basophils Absolute: 0.1 10*3/uL (ref 0.0–0.1)
Basophils Relative: 1 %
Eosinophils Absolute: 0.1 10*3/uL (ref 0.0–0.5)
Eosinophils Relative: 1 %
HCT: 41.7 % (ref 36.0–46.0)
Hemoglobin: 12.3 g/dL (ref 12.0–15.0)
Immature Granulocytes: 0 %
Lymphocytes Relative: 32 %
Lymphs Abs: 3.8 10*3/uL (ref 0.7–4.0)
MCH: 22.9 pg — ABNORMAL LOW (ref 26.0–34.0)
MCHC: 29.5 g/dL — ABNORMAL LOW (ref 30.0–36.0)
MCV: 77.7 fL — ABNORMAL LOW (ref 80.0–100.0)
Monocytes Absolute: 0.6 10*3/uL (ref 0.1–1.0)
Monocytes Relative: 5 %
Neutro Abs: 7.1 10*3/uL (ref 1.7–7.7)
Neutrophils Relative %: 61 %
Platelets: 445 10*3/uL — ABNORMAL HIGH (ref 150–400)
RBC: 5.37 MIL/uL — ABNORMAL HIGH (ref 3.87–5.11)
RDW: 20.4 % — ABNORMAL HIGH (ref 11.5–15.5)
WBC: 11.6 10*3/uL — ABNORMAL HIGH (ref 4.0–10.5)
nRBC: 0 % (ref 0.0–0.2)

## 2022-01-22 MED ORDER — OXYCODONE-ACETAMINOPHEN 5-325 MG PO TABS
1.0000 | ORAL_TABLET | Freq: Once | ORAL | Status: AC
  Administered 2022-01-22: 1 via ORAL
  Filled 2022-01-22: qty 1

## 2022-01-22 MED ORDER — POTASSIUM CHLORIDE CRYS ER 20 MEQ PO TBCR
40.0000 meq | EXTENDED_RELEASE_TABLET | Freq: Once | ORAL | Status: AC
  Administered 2022-01-22: 40 meq via ORAL
  Filled 2022-01-22: qty 2

## 2022-01-22 MED ORDER — POTASSIUM CHLORIDE CRYS ER 20 MEQ PO TBCR: EXTENDED_RELEASE_TABLET | ORAL | 0 refills | Status: DC

## 2022-01-22 NOTE — ED Notes (Addendum)
Pt made aware that per attending provider, she is discharged and cannot stay here. She states that she needs a little bit of time to make some phone calls.

## 2022-01-23 DIAGNOSIS — R531 Weakness: Secondary | ICD-10-CM | POA: Diagnosis not present

## 2022-01-23 NOTE — Progress Notes (Addendum)
PT left LTC facility that she was placed in last admission. Pt will need to work with her Primary Care Physician to find new  LTC placement. Pt will d/c back back home. TOC sign off.  [name].Bryttany Tortorelli, MSW, LCSWA [facility]  Transitions of Care Clinical Social Worker I Direct Dial: [phone]  Fax: [phone] [email or web address]

## 2022-01-25 LAB — URINE CULTURE: Culture: >=100000 — AB

## 2022-01-26 ENCOUNTER — Telehealth: Payer: Self-pay | Admitting: Emergency Medicine

## 2022-01-26 NOTE — Progress Notes (Signed)
ED Antimicrobial Stewardship Positive Culture Follow Up   [name] is an 66 y.o. female who presented to [facility] on 01/22/2022 with a chief complaint of  Chief Complaint  Patient presents with   Wound Check   Needs Nursing Home Placement     Recent Results (from the past 720 hour(s))  Urine Culture     Status: Abnormal   Collection Time: 01/22/22  7:17 PM   Specimen: Urine, Clean Catch  Result Value Ref Range Status   Specimen Description   Final    URINE, CLEAN CATCH Performed at [facility], [address], [place], [place] [address]    Special Requests   Final    NONE Performed at [facility], [address], [place], [place] [address]    Culture >=100,000 COLONIES/mL PROTEUS MIRABILIS (A)  Final   Report Status 01/25/2022 FINAL  Final   Organism ID, Bacteria PROTEUS MIRABILIS (A)  Final      Susceptibility   Proteus mirabilis - MIC*    AMPICILLIN <=2 SENSITIVE Sensitive     CEFAZOLIN 8 SENSITIVE Sensitive     CEFEPIME <=0.12 SENSITIVE Sensitive     CEFTRIAXONE <=0.25 SENSITIVE Sensitive     CIPROFLOXACIN <=0.25 SENSITIVE Sensitive     GENTAMICIN <=1 SENSITIVE Sensitive     IMIPENEM 2 SENSITIVE Sensitive     NITROFURANTOIN 128 RESISTANT Resistant     TRIMETH/SULFA <=20 SENSITIVE Sensitive     AMPICILLIN/SULBACTAM <=2 SENSITIVE Sensitive     PIP/TAZO <=4 SENSITIVE Sensitive     * >=100,000 COLONIES/mL PROTEUS MIRABILIS    Asymptomatic bacteruria - No treatment needed.   ED Provider: Kristine Royal, MD   Sharin Mons, PharmD, BCPS, BCIDP Infectious Diseases Clinical Pharmacist Phone: [phone] 01/26/2022, 8:52 AM

## 2022-09-28 NOTE — Progress Notes (Deleted)
Synopsis: Referred for abnormal ct chest by Crist Infante, MD  Subjective:   PATIENT ID: [name] GENDER: female DOB: May 19, 1956, MRN: [id number]  No chief complaint on file.  67yF with history of COPD, gout referred for abnormal CT Chest  Otherwise pertinent review of systems is negative.  Past Medical History:  Diagnosis Date   COPD (chronic obstructive pulmonary disease) ([place])    Gout    Hypercholesteremia    Sepsis ([place])      No family history on file.   Past Surgical History:  Procedure Laterality Date   ABDOMINAL HYSTERECTOMY     ROTATOR CUFF REPAIR      Social History   Socioeconomic History   Marital status: Single    Spouse name: Not on file   Number of children: Not on file   Years of education: Not on file   Highest education level: Not on file  Occupational History   Not on file  Tobacco Use   Smoking status: Every Day    Packs/day: 1.00    Years: 30.00    Total pack years: 30.00    Types: Cigarettes   Smokeless tobacco: Never  Substance and Sexual Activity   Alcohol use: No    Alcohol/week: 0.0 standard drinks of alcohol   Drug use: No   Sexual activity: Not on file  Other Topics Concern   Not on file  Social History Narrative   Not on file   Social Determinants of Health   Financial Resource Strain: Medium Risk (04/04/2018)   Overall Financial Resource Strain (CARDIA)    Difficulty of Paying Living Expenses: Somewhat hard  Food Insecurity: Food Insecurity Present (04/04/2018)   Hunger Vital Sign    Worried About Running Out of Food in the Last Year: Sometimes true    Ran Out of Food in the Last Year: Sometimes true  Transportation Needs: No Transportation Needs (04/04/2018)   PRAPARE - Hydrologist (Medical): No    Lack of Transportation (Non-Medical): No  Physical Activity: Insufficiently Active (04/04/2018)   Exercise Vital Sign    Days of Exercise per Week: 7 days    Minutes of Exercise per Session: 10  min  Stress: Stress Concern Present (04/04/2018)   [place]    Feeling of Stress : To some extent  Social Connections: Not on file  Intimate Partner Violence: Not on file     Allergies  Allergen Reactions   Gemfibrozil     Other reaction(s): Sick to stomach   Nitroglycerin     Other reaction(s): Cough, Irritation   Other Other (See Comments)    Grape fruit juice - unknown   Pravastatin     Other reaction(s): Dizzy   Sulfamethoxazole-Trimethoprim     Other reaction(s): N/V   Tyloxapol     Other reaction(s): Does not work     Outpatient Medications Prior to Visit  Medication Sig Dispense Refill   acetaminophen (TYLENOL) 325 MG tablet Take 2 tablets (650 mg total) by mouth every 6 (six) hours as needed for mild pain (or Fever >/= 101).     atorvastatin (LIPITOR) 10 MG tablet Take 10 mg by mouth daily.     buPROPion (WELLBUTRIN XL) 150 MG 24 hr tablet Take 150 mg by mouth every evening.      feeding supplement (ENSURE ENLIVE / ENSURE PLUS) LIQD Take 237 mLs by mouth daily. 237 mL 30   gabapentin (  NEURONTIN) 300 MG capsule Take 300 mg by mouth 3 (three) times daily.     hydrocortisone cream 0.5 % Apply topically 3 (three) times daily as needed for itching. 30 g 0   ipratropium-albuterol (DUONEB) 0.5-2.5 (3) MG/3ML SOLN use 1 vial by nebulization every 4 (four) hours as needed. (Patient taking differently: Take 3 mLs by nebulization every 4 (four) hours as needed (wheezing).) 360 mL 0   levothyroxine (SYNTHROID) 150 MCG tablet Take 150 mcg by mouth daily before breakfast.     lidocaine (XYLOCAINE) 2 % solution Use as directed 15 mLs in the mouth or throat as needed for mouth pain. 100 mL 2   liver oil-zinc oxide (DESITIN) 40 % ointment Apply topically 4 (four) times daily. (Patient not taking: Reported on 09/06/2021) 56.7 g 0   Multiple Vitamin (MULTIVITAMIN WITH MINERALS) TABS tablet Take 1 tablet by mouth daily.      ondansetron (ZOFRAN) 4 MG tablet Take 1 tablet (4 mg total) by mouth every 6 (six) hours as needed for nausea.  0   pantoprazole (PROTONIX) 40 MG tablet Take 1 tablet (40 mg total) by mouth 2 (two) times daily before a meal for 14 days, THEN 1 tablet (40 mg total) daily for 14 days.  2   polyethylene glycol (MIRALAX / GLYCOLAX) 17 g packet Take 17 g by mouth 2 (two) times daily. Decreased to once daily if having diarrhea. 60 each 0   potassium chloride SA (KLOR-CON M) 20 MEQ tablet One po bid x 3 days, then one po once per day 15 tablet 0   sucralfate (CARAFATE) 1 g tablet Take 1 tablet (1 g total) by mouth 4 (four) times daily -  with meals and at bedtime for 14 days. 56 tablet 0   SYMBICORT 160-4.5 MCG/ACT inhaler Inhale 2 puffs into the lungs 3 (three) times daily.  6   venlafaxine XR (EFFEXOR-XR) 150 MG 24 hr capsule Take 150 mg by mouth at bedtime.     No facility-administered medications prior to visit.       Objective:   Physical Exam:  General appearance: 67 y.o., female, NAD, conversant  Eyes: anicteric sclerae; PERRL, tracking appropriately HENT: NCAT; MMM Neck: Trachea midline; no lymphadenopathy, no JVD Lungs: CTAB, no crackles, no wheeze, with normal respiratory effort CV: RRR, no murmur  Abdomen: Soft, non-tender; non-distended, BS present  Extremities: No peripheral edema, warm Skin: Normal turgor and texture; no rash Psych: Appropriate affect Neuro: Alert and oriented to person and place, no focal deficit     There were no vitals filed for this visit.   on *** LPM *** RA BMI Readings from Last 3 Encounters:  09/05/21 34.28 kg/m  09/03/21 46.85 kg/m  06/17/21 14.08 kg/m   Wt Readings from Last 3 Encounters:  09/05/21 206 lb (93.4 kg)  09/03/21 272 lb 14.9 oz (123.8 kg)  06/17/21 82 lb 0.2 oz (37.2 kg)     CBC    Component Value Date/Time   WBC 11.6 (H) 01/22/2022 1355   RBC 5.37 (H) 01/22/2022 1355   HGB 12.3 01/22/2022 1355   HCT 41.7 01/22/2022  1355   PLT 445 (H) 01/22/2022 1355   MCV 77.7 (L) 01/22/2022 1355   MCH 22.9 (L) 01/22/2022 1355   MCHC 29.5 (L) 01/22/2022 1355   RDW 20.4 (H) 01/22/2022 1355   LYMPHSABS 3.8 01/22/2022 1355   MONOABS 0.6 01/22/2022 1355   EOSABS 0.1 01/22/2022 1355   BASOSABS 0.1 01/22/2022 1355    ***  Chest Imaging: CTA Chest 10/28/21 with likely subtle emphysema, bronchial wall thickening and subsegmental atelectasis posterior LUL  Pulmonary Functions Testing Results:     No data to display          Echocardiogram 08/15/21:    1. Left ventricular ejection fraction, by estimation, is 50 to 55%. The  left ventricle has low normal function. The left ventricle has no regional  wall motion abnormalities. Left ventricular diastolic parameters are  consistent with Grade I diastolic  dysfunction (impaired relaxation).   2. Right ventricular systolic function is normal. The right ventricular  size is normal. There is mildly elevated pulmonary artery systolic  pressure. The estimated right ventricular systolic pressure is 123456 mmHg.   3. The mitral valve is normal in structure. No evidence of mitral valve  regurgitation.   4. The aortic valve was not well visualized. Aortic valve regurgitation  is not visualized. No aortic stenosis is present.   5. The inferior vena cava is dilated in size with >50% respiratory  variability, suggesting right atrial pressure of 8 mmHg.   Heart Catheterization: ***    Assessment & Plan:    Plan:      Connie Hurter, MD [facility] 09/28/2022 2:25 PM

## 2022-09-30 ENCOUNTER — Institutional Professional Consult (permissible substitution): Payer: Medicare Other | Admitting: Student

## 2022-10-01 ENCOUNTER — Encounter: Payer: Self-pay | Admitting: Student

## 2022-10-14 ENCOUNTER — Telehealth: Payer: Self-pay | Admitting: Student

## 2022-10-14 ENCOUNTER — Encounter: Payer: Self-pay | Admitting: Student

## 2022-10-14 ENCOUNTER — Ambulatory Visit (INDEPENDENT_AMBULATORY_CARE_PROVIDER_SITE_OTHER): Payer: Medicare HMO | Admitting: Student

## 2022-10-14 VITALS — BP 116/80 | HR 101 | Temp 98.3°F | Ht 65.0 in | Wt 201.0 lb

## 2022-10-14 DIAGNOSIS — B370 Candidal stomatitis: Secondary | ICD-10-CM

## 2022-10-14 DIAGNOSIS — J441 Chronic obstructive pulmonary disease with (acute) exacerbation: Secondary | ICD-10-CM

## 2022-10-14 DIAGNOSIS — F17200 Nicotine dependence, unspecified, uncomplicated: Secondary | ICD-10-CM | POA: Diagnosis not present

## 2022-10-14 DIAGNOSIS — R6889 Other general symptoms and signs: Secondary | ICD-10-CM | POA: Diagnosis not present

## 2022-10-14 MED ORDER — BREZTRI AEROSPHERE 160-9-4.8 MCG/ACT IN AERO: 2.0000 | INHALATION_SPRAY | Freq: Two times a day (BID) | RESPIRATORY_TRACT | 0 refills | Status: DC

## 2022-10-14 MED ORDER — FLUCONAZOLE 100 MG PO TABS: 100.0000 mg | ORAL_TABLET | Freq: Every day | ORAL | 0 refills | Status: DC

## 2022-10-14 NOTE — Addendum Note (Signed)
Addended by: Rosana Berger on: 10/14/2022 04:25 PM   Modules accepted: Orders

## 2022-10-14 NOTE — Telephone Encounter (Signed)
What is least expensive triple therapy inhaler for her?  Thanks!

## 2022-10-14 NOTE — Patient Instructions (Signed)
-   start breztri 2 puffs twice daily, rinse mouth after each use. Stop symbicort while you are on breztri. I will verify cost before sending prescription in - continue steroid taper - referral made for lung cancer screening, due now - try your best to stop smoking

## 2022-10-14 NOTE — Progress Notes (Signed)
Synopsis: Referred for dyspnea by Crist Infante, MD  Subjective:   PATIENT ID: [name] GENDER: female DOB: June 07, 1956, MRN: [id number]  Chief Complaint  Patient presents with   Pulmonary Consult    Referred by Dr Joylene Draft for COPD. She c/o cough- prod with white sputum for the past month. She gets SOB walking short distances.    26yF with histroy of COPD, asthma,  OSA, GERD, hypothyroid, chronic venous insufficiency  She is currently on the only course of steroids that she's been on this year. Takes symbicort 2 puffs twice daily, rinses mouth after each use.  She says her DOE is to 50 feet or so worsening over the last year or so.   She uses oxygen at home 'when I need it.' DME supplier is [employer].    Otherwise pertinent review of systems is negative.  No family history of lung disease that she can recall  She can't remember where she worked. Smoked 20 years 1ppd, still smoking.   Past Medical History:  Diagnosis Date   COPD (chronic obstructive pulmonary disease) ([place])    Gout    Hypercholesteremia    Sepsis ([place])      History reviewed. No pertinent family history.   Past Surgical History:  Procedure Laterality Date   ABDOMINAL HYSTERECTOMY     ROTATOR CUFF REPAIR      Social History   Socioeconomic History   Marital status: Single    Spouse name: Not on file   Number of children: Not on file   Years of education: Not on file   Highest education level: Not on file  Occupational History   Not on file  Tobacco Use   Smoking status: Former    Packs/day: 1.00    Years: 50.00    Additional pack years: 0.00    Total pack years: 50.00    Types: Cigarettes    Quit date: 04/02/2022    Years since quitting: 0.5   Smokeless tobacco: Never  Vaping Use   Vaping Use: Never used  Substance and Sexual Activity   Alcohol use: No    Alcohol/week: 0.0 standard drinks of alcohol   Drug use: No   Sexual activity: Not on file  Other Topics Concern   Not on file   Social History Narrative   Not on file   Social Determinants of Health   Financial Resource Strain: Medium Risk (04/04/2018)   Overall Financial Resource Strain (CARDIA)    Difficulty of Paying Living Expenses: Somewhat hard  Food Insecurity: Food Insecurity Present (04/04/2018)   Hunger Vital Sign    Worried About Running Out of Food in the Last Year: Sometimes true    Ran Out of Food in the Last Year: Sometimes true  Transportation Needs: No Transportation Needs (04/04/2018)   PRAPARE - Hydrologist (Medical): No    Lack of Transportation (Non-Medical): No  Physical Activity: Insufficiently Active (04/04/2018)   Exercise Vital Sign    Days of Exercise per Week: 7 days    Minutes of Exercise per Session: 10 min  Stress: Stress Concern Present (04/04/2018)   [place]    Feeling of Stress : To some extent  Social Connections: Not on file  Intimate Partner Violence: Not on file     Allergies  Allergen Reactions   Gemfibrozil     Other reaction(s): Sick to stomach   Nitroglycerin     Other  reaction(s): Cough, Irritation   Other Other (See Comments)    Grape fruit juice - unknown   Pravastatin     Other reaction(s): Dizzy   Sulfamethoxazole-Trimethoprim     Other reaction(s): N/V   Tyloxapol     Other reaction(s): Does not work     Outpatient Medications Prior to Visit  Medication Sig Dispense Refill   acetaminophen (TYLENOL) 325 MG tablet Take 2 tablets (650 mg total) by mouth every 6 (six) hours as needed for mild pain (or Fever >/= 101).     atorvastatin (LIPITOR) 10 MG tablet Take 10 mg by mouth daily.     buPROPion (WELLBUTRIN XL) 150 MG 24 hr tablet Take 150 mg by mouth every evening.      feeding supplement (ENSURE ENLIVE / ENSURE PLUS) LIQD Take 237 mLs by mouth daily. 237 mL 30   gabapentin (NEURONTIN) 300 MG capsule Take 300 mg by mouth 3 (three) times daily.      hydrocortisone cream 0.5 % Apply topically 3 (three) times daily as needed for itching. 30 g 0   ipratropium-albuterol (DUONEB) 0.5-2.5 (3) MG/3ML SOLN use 1 vial by nebulization every 4 (four) hours as needed. (Patient taking differently: Take 3 mLs by nebulization every 4 (four) hours as needed (wheezing).) 360 mL 0   levothyroxine (SYNTHROID) 150 MCG tablet Take 150 mcg by mouth daily before breakfast.     lidocaine (XYLOCAINE) 2 % solution Use as directed 15 mLs in the mouth or throat as needed for mouth pain. 100 mL 2   liver oil-zinc oxide (DESITIN) 40 % ointment Apply topically 4 (four) times daily. (Patient not taking: Reported on 09/06/2021) 56.7 g 0   Multiple Vitamin (MULTIVITAMIN WITH MINERALS) TABS tablet Take 1 tablet by mouth daily.     ondansetron (ZOFRAN) 4 MG tablet Take 1 tablet (4 mg total) by mouth every 6 (six) hours as needed for nausea.  0   pantoprazole (PROTONIX) 40 MG tablet Take 1 tablet (40 mg total) by mouth 2 (two) times daily before a meal for 14 days, THEN 1 tablet (40 mg total) daily for 14 days.  2   polyethylene glycol (MIRALAX / GLYCOLAX) 17 g packet Take 17 g by mouth 2 (two) times daily. Decreased to once daily if having diarrhea. 60 each 0   potassium chloride SA (KLOR-CON M) 20 MEQ tablet One po bid x 3 days, then one po once per day 15 tablet 0   sucralfate (CARAFATE) 1 g tablet Take 1 tablet (1 g total) by mouth 4 (four) times daily -  with meals and at bedtime for 14 days. 56 tablet 0   SYMBICORT 160-4.5 MCG/ACT inhaler Inhale 2 puffs into the lungs 3 (three) times daily.  6   venlafaxine XR (EFFEXOR-XR) 150 MG 24 hr capsule Take 150 mg by mouth at bedtime.     No facility-administered medications prior to visit.       Objective:   Physical Exam:  General appearance: 67 y.o., female, NAD, conversant  Eyes: anicteric sclerae; PERRL, tracking appropriately HENT: NCAT; MMM Neck: Trachea midline; no lymphadenopathy, no JVD Lungs: wheeze/rhonchi  bilateral, with normal respiratory effort CV: RRR, no murmur  Abdomen: Soft, non-tender; non-distended, BS present  Extremities: No peripheral edema, warm Skin: Normal turgor and texture; no rash Psych: Appropriate affect Neuro: Alert and oriented to person and place, no focal deficit     Vitals:   10/14/22 1344  BP: 116/80  Pulse: (!) 101  Temp: 98.3 F (  36.8 C)  TempSrc: Oral  SpO2: 92%  Weight: 201 lb (91.2 kg)  Height: '5\' 5"'$  (1.651 m)   92% on RA BMI Readings from Last 3 Encounters:  10/14/22 33.45 kg/m  09/05/21 34.28 kg/m  09/03/21 46.85 kg/m   Wt Readings from Last 3 Encounters:  10/14/22 201 lb (91.2 kg)  09/05/21 206 lb (93.4 kg)  09/03/21 272 lb 14.9 oz (123.8 kg)     CBC    Component Value Date/Time   WBC 11.6 (H) 01/22/2022 1355   RBC 5.37 (H) 01/22/2022 1355   HGB 12.3 01/22/2022 1355   HCT 41.7 01/22/2022 1355   PLT 445 (H) 01/22/2022 1355   MCV 77.7 (L) 01/22/2022 1355   MCH 22.9 (L) 01/22/2022 1355   MCHC 29.5 (L) 01/22/2022 1355   RDW 20.4 (H) 01/22/2022 1355   LYMPHSABS 3.8 01/22/2022 1355   MONOABS 0.6 01/22/2022 1355   EOSABS 0.1 01/22/2022 1355   BASOSABS 0.1 01/22/2022 1355    Eos 100-200  Chest Imaging: CTA Chest 10/28/21 with faint/subtle multifocal TIB, bronchial wall thickening  Pulmonary Functions Testing Results:     No data to display            Echocardiogram 08/15/21:   1. Left ventricular ejection fraction, by estimation, is 50 to 55%. The  left ventricle has low normal function. The left ventricle has no regional  wall motion abnormalities. Left ventricular diastolic parameters are  consistent with Grade I diastolic  dysfunction (impaired relaxation).   2. Right ventricular systolic function is normal. The right ventricular  size is normal. There is mildly elevated pulmonary artery systolic  pressure. The estimated right ventricular systolic pressure is 123456 mmHg.   3. The mitral valve is normal in  structure. No evidence of mitral valve  regurgitation.   4. The aortic valve was not well visualized. Aortic valve regurgitation  is not visualized. No aortic stenosis is present.   5. The inferior vena cava is dilated in size with >50% respiratory  variability, suggesting right atrial pressure of 8 mmHg.       Assessment & Plan:   # DOE # AECOPD # COPD gold functional group B  # Thrush Mainly under tongue vs whitish discoloration around aphthous ulcer  # Smoking 20 py active   Plan: - breztri 2 puffs twice daily, rinse mouth after each use - continue steroid taper - stop nystatin - start fluconazole 100 mg daily for thrush - referral made for lung cancer screening, due now - smoking cessation strongly encouraged     Maryjane Hurter, MD [facility] 10/14/2022 1:51 PM

## 2022-10-19 DIAGNOSIS — F17200 Nicotine dependence, unspecified, uncomplicated: Secondary | ICD-10-CM | POA: Diagnosis not present

## 2022-10-19 DIAGNOSIS — J449 Chronic obstructive pulmonary disease, unspecified: Secondary | ICD-10-CM | POA: Diagnosis not present

## 2022-10-19 DIAGNOSIS — I872 Venous insufficiency (chronic) (peripheral): Secondary | ICD-10-CM | POA: Diagnosis not present

## 2022-10-19 DIAGNOSIS — J9611 Chronic respiratory failure with hypoxia: Secondary | ICD-10-CM | POA: Diagnosis not present

## 2022-10-19 DIAGNOSIS — D7589 Other specified diseases of blood and blood-forming organs: Secondary | ICD-10-CM | POA: Diagnosis not present

## 2022-10-19 DIAGNOSIS — I10 Essential (primary) hypertension: Secondary | ICD-10-CM | POA: Diagnosis not present

## 2022-10-19 DIAGNOSIS — E039 Hypothyroidism, unspecified: Secondary | ICD-10-CM | POA: Diagnosis not present

## 2022-10-19 DIAGNOSIS — D509 Iron deficiency anemia, unspecified: Secondary | ICD-10-CM | POA: Diagnosis not present

## 2022-10-19 DIAGNOSIS — F339 Major depressive disorder, recurrent, unspecified: Secondary | ICD-10-CM | POA: Diagnosis not present

## 2022-10-19 DIAGNOSIS — R6889 Other general symptoms and signs: Secondary | ICD-10-CM | POA: Diagnosis not present

## 2022-10-19 DIAGNOSIS — J45909 Unspecified asthma, uncomplicated: Secondary | ICD-10-CM | POA: Diagnosis not present

## 2022-10-19 DIAGNOSIS — E785 Hyperlipidemia, unspecified: Secondary | ICD-10-CM | POA: Diagnosis not present

## 2022-10-19 DIAGNOSIS — R5383 Other fatigue: Secondary | ICD-10-CM | POA: Diagnosis not present

## 2022-10-21 ENCOUNTER — Other Ambulatory Visit (HOSPITAL_COMMUNITY): Payer: Self-pay

## 2022-10-21 MED ORDER — TRELEGY ELLIPTA 100-62.5-25 MCG/ACT IN AEPB: 1.0000 | INHALATION_SPRAY | Freq: Every day | RESPIRATORY_TRACT | 11 refills | Status: DC

## 2022-10-21 NOTE — Telephone Encounter (Signed)
Per test claims Trelegy is currently covered for the patient for $0.00

## 2022-10-22 ENCOUNTER — Encounter: Payer: Self-pay | Admitting: Gastroenterology

## 2022-11-22 ENCOUNTER — Other Ambulatory Visit: Payer: Self-pay

## 2022-11-22 DIAGNOSIS — Z87891 Personal history of nicotine dependence: Secondary | ICD-10-CM

## 2022-12-06 ENCOUNTER — Encounter (HOSPITAL_COMMUNITY): Payer: Self-pay

## 2022-12-06 ENCOUNTER — Emergency Department (HOSPITAL_COMMUNITY): Payer: Medicare HMO

## 2022-12-06 ENCOUNTER — Observation Stay (HOSPITAL_COMMUNITY)
Admission: EM | Admit: 2022-12-06 | Discharge: 2022-12-09 | Disposition: A | Discharge status: Home Health | Payer: Medicare HMO | Attending: Internal Medicine | Admitting: Internal Medicine

## 2022-12-06 ENCOUNTER — Other Ambulatory Visit: Payer: Self-pay

## 2022-12-06 ENCOUNTER — Ambulatory Visit: Payer: Medicare HMO | Admitting: Gastroenterology

## 2022-12-06 DIAGNOSIS — K921 Melena: Secondary | ICD-10-CM | POA: Diagnosis not present

## 2022-12-06 DIAGNOSIS — F32A Depression, unspecified: Secondary | ICD-10-CM | POA: Diagnosis present

## 2022-12-06 DIAGNOSIS — Z79899 Other long term (current) drug therapy: Secondary | ICD-10-CM | POA: Insufficient documentation

## 2022-12-06 DIAGNOSIS — Z87891 Personal history of nicotine dependence: Secondary | ICD-10-CM | POA: Insufficient documentation

## 2022-12-06 DIAGNOSIS — I5032 Chronic diastolic (congestive) heart failure: Secondary | ICD-10-CM | POA: Diagnosis present

## 2022-12-06 DIAGNOSIS — K219 Gastro-esophageal reflux disease without esophagitis: Secondary | ICD-10-CM | POA: Diagnosis not present

## 2022-12-06 DIAGNOSIS — I700 Atherosclerosis of aorta: Secondary | ICD-10-CM | POA: Diagnosis not present

## 2022-12-06 DIAGNOSIS — R197 Diarrhea, unspecified: Principal | ICD-10-CM | POA: Diagnosis present

## 2022-12-06 DIAGNOSIS — R531 Weakness: Secondary | ICD-10-CM

## 2022-12-06 DIAGNOSIS — R1084 Generalized abdominal pain: Secondary | ICD-10-CM | POA: Diagnosis not present

## 2022-12-06 DIAGNOSIS — J449 Chronic obstructive pulmonary disease, unspecified: Secondary | ICD-10-CM | POA: Diagnosis not present

## 2022-12-06 DIAGNOSIS — I10 Essential (primary) hypertension: Secondary | ICD-10-CM | POA: Diagnosis not present

## 2022-12-06 DIAGNOSIS — R109 Unspecified abdominal pain: Secondary | ICD-10-CM | POA: Insufficient documentation

## 2022-12-06 DIAGNOSIS — E785 Hyperlipidemia, unspecified: Secondary | ICD-10-CM | POA: Diagnosis present

## 2022-12-06 DIAGNOSIS — W19XXXA Unspecified fall, initial encounter: Secondary | ICD-10-CM | POA: Diagnosis not present

## 2022-12-06 DIAGNOSIS — E039 Hypothyroidism, unspecified: Secondary | ICD-10-CM | POA: Diagnosis not present

## 2022-12-06 DIAGNOSIS — E782 Mixed hyperlipidemia: Secondary | ICD-10-CM | POA: Diagnosis not present

## 2022-12-06 LAB — CBC WITH DIFFERENTIAL/PLATELET
Abs Immature Granulocytes: 0.03 10*3/uL (ref 0.00–0.07)
Basophils Absolute: 0.1 10*3/uL (ref 0.0–0.1)
Basophils Relative: 1 %
Eosinophils Absolute: 0 10*3/uL (ref 0.0–0.5)
Eosinophils Relative: 0 %
HCT: 49.4 % — ABNORMAL HIGH (ref 36.0–46.0)
Hemoglobin: 15 g/dL (ref 12.0–15.0)
Immature Granulocytes: 0 %
Lymphocytes Relative: 19 %
Lymphs Abs: 1.5 10*3/uL (ref 0.7–4.0)
MCH: 29.8 pg (ref 26.0–34.0)
MCHC: 30.4 g/dL (ref 30.0–36.0)
MCV: 98 fL (ref 80.0–100.0)
Monocytes Absolute: 0.3 10*3/uL (ref 0.1–1.0)
Monocytes Relative: 4 %
Neutro Abs: 5.9 10*3/uL (ref 1.7–7.7)
Neutrophils Relative %: 76 %
Platelets: 210 10*3/uL (ref 150–400)
RBC: 5.04 MIL/uL (ref 3.87–5.11)
RDW: 23.5 % — ABNORMAL HIGH (ref 11.5–15.5)
WBC: 7.8 10*3/uL (ref 4.0–10.5)
nRBC: 0 % (ref 0.0–0.2)

## 2022-12-06 LAB — COMPREHENSIVE METABOLIC PANEL
ALT: 26 U/L (ref 0–44)
AST: 24 U/L (ref 15–41)
Albumin: 3.7 g/dL (ref 3.5–5.0)
Alkaline Phosphatase: 65 U/L (ref 38–126)
Anion gap: 10 (ref 5–15)
BUN: 11 mg/dL (ref 8–23)
CO2: 30 mmol/L (ref 22–32)
Calcium: 9.3 mg/dL (ref 8.9–10.3)
Chloride: 101 mmol/L (ref 98–111)
Creatinine, Ser: 0.81 mg/dL (ref 0.44–1.00)
GFR, Estimated: >60 mL/min (ref >60)
Glucose, Bld: 121 mg/dL — ABNORMAL HIGH (ref 70–99)
Potassium: 3.9 mmol/L (ref 3.5–5.1)
Sodium: 141 mmol/L (ref 135–145)
Total Bilirubin: 1.2 mg/dL (ref 0.3–1.2)
Total Protein: 7.1 g/dL (ref 6.5–8.1)

## 2022-12-06 LAB — URINALYSIS, W/ REFLEX TO CULTURE (INFECTION SUSPECTED)
Bilirubin Urine: NEGATIVE
Glucose, UA: NEGATIVE mg/dL
Ketones, ur: NEGATIVE mg/dL
Leukocytes,Ua: NEGATIVE
Nitrite: POSITIVE — AB
Protein, ur: NEGATIVE mg/dL
Specific Gravity, Urine: 1.012 (ref 1.005–1.030)
pH: 8 (ref 5.0–8.0)

## 2022-12-06 LAB — TYPE AND SCREEN
ABO/RH(D): A POS
Antibody Screen: NEGATIVE

## 2022-12-06 LAB — LACTIC ACID, PLASMA: Lactic Acid, Venous: 0.9 mmol/L (ref 0.5–1.9)

## 2022-12-06 LAB — LIPASE, BLOOD: Lipase: 20 U/L (ref 11–51)

## 2022-12-06 LAB — POC OCCULT BLOOD, ED: Fecal Occult Bld: NEGATIVE

## 2022-12-06 MED ORDER — IOHEXOL 300 MG/ML  SOLN
100.0000 mL | Freq: Once | INTRAMUSCULAR | Status: AC | PRN
  Administered 2022-12-06: 100 mL via INTRAVENOUS

## 2022-12-06 MED ORDER — SODIUM CHLORIDE 0.9 % IV BOLUS
500.0000 mL | Freq: Once | INTRAVENOUS | Status: AC
  Administered 2022-12-06: 500 mL via INTRAVENOUS

## 2022-12-06 MED ORDER — SODIUM CHLORIDE 0.9 % IV SOLN: Freq: Once | INTRAVENOUS | Status: DC

## 2022-12-06 NOTE — ED Triage Notes (Signed)
Per EMS, Pt, from home, c/o lower abdominal pain, urinary frequency, and possible black stools x5 days.  Pain score 8/10.    Per NT, Pt had a BM in her pants en route.  BM was brown.

## 2022-12-06 NOTE — ED Provider Notes (Signed)
[place] EMERGENCY DEPARTMENT AT [facility] Provider Note   CSN: [id number] Arrival date & time: 12/06/22  1614     History  Chief Complaint  Patient presents with   Abdominal Pain   Rectal Bleeding    ROLANDE SJOSTROM is a 67 y.o. female.  66 year old female with prior medical history as detailed below presents for evaluation.  Patient reports significant amount/number of loose bowel movements over the last 2 to 3 days.  Patient reports that she has had multiple loose bowel movements.  She cannot quantify the number of BMs.  She complains of diffuse abdominal cramping associated with same.  She denies vomiting.  She denies fever.  She denies gross blood in her stool.  She does report that the majority of her stool is loose and watery.  She saw several darker colored stools through the weekend.  Patient had a BM during transport with EMS.  The history is provided by the patient and medical records.       Home Medications Prior to Admission medications   Medication Sig Start Date End Date Taking? Authorizing Provider  acetaminophen (TYLENOL) 325 MG tablet Take 2 tablets (650 mg total) by mouth every 6 (six) hours as needed for mild pain (or Fever >/= 101). 09/15/21   Pokhrel, Rebekah Chesterfield, MD  atorvastatin (LIPITOR) 10 MG tablet Take 10 mg by mouth daily.    [provider]  Budeson-Glycopyrrol-Formoterol (BREZTRI AEROSPHERE) 160-9-4.8 MCG/ACT AERO Inhale 2 puffs into the lungs in the morning and at bedtime. 10/14/22   Omar Person, MD  buPROPion (WELLBUTRIN XL) 150 MG 24 hr tablet Take 150 mg by mouth every evening.     [provider]  feeding supplement (ENSURE ENLIVE / ENSURE PLUS) LIQD Take 237 mLs by mouth daily. 09/04/21   Narda Bonds, MD  fluconazole (DIFLUCAN) 100 MG tablet Take 1 tablet (100 mg total) by mouth daily. 10/14/22   Omar Person, MD  Fluticasone-Umeclidin-Vilant (TRELEGY ELLIPTA) 100-62.5-25 MCG/ACT AEPB Inhale 1 puff into  the lungs daily. 10/21/22   Omar Person, MD  gabapentin (NEURONTIN) 300 MG capsule Take 300 mg by mouth 3 (three) times daily.    [provider]  hydrocortisone cream 0.5 % Apply topically 3 (three) times daily as needed for itching. 09/15/21   Pokhrel, Rebekah Chesterfield, MD  ipratropium-albuterol (DUONEB) 0.5-2.5 (3) MG/3ML SOLN use 1 vial by nebulization every 4 (four) hours as needed. Patient taking differently: Take 3 mLs by nebulization every 4 (four) hours as needed (wheezing). 03/06/21   Narda Bonds, MD  levothyroxine (SYNTHROID) 150 MCG tablet Take 150 mcg by mouth daily before breakfast.    [provider]  lidocaine (XYLOCAINE) 2 % solution Use as directed 15 mLs in the mouth or throat as needed for mouth pain. 09/04/21   Narda Bonds, MD  liver oil-zinc oxide (DESITIN) 40 % ointment Apply topically 4 (four) times daily. Patient not taking: Reported on 09/06/2021 06/23/21   Azucena Fallen, MD  Multiple Vitamin (MULTIVITAMIN WITH MINERALS) TABS tablet Take 1 tablet by mouth daily. 09/15/21   Pokhrel, Rebekah Chesterfield, MD  ondansetron (ZOFRAN) 4 MG tablet Take 1 tablet (4 mg total) by mouth every 6 (six) hours as needed for nausea. 09/15/21   Pokhrel, Rebekah Chesterfield, MD  pantoprazole (PROTONIX) 40 MG tablet Take 1 tablet (40 mg total) by mouth 2 (two) times daily before a meal for 14 days, THEN 1 tablet (40 mg total) daily for 14 days. 09/15/21 10/13/21  Pokhrel, Laxman, MD  polyethylene glycol (MIRALAX / GLYCOLAX) 17 g packet Take 17 g by mouth 2 (two) times daily. Decreased to once daily if having diarrhea. 09/04/21   Narda Bonds, MD  potassium chloride SA (KLOR-CON M) 20 MEQ tablet One po bid x 3 days, then one po once per day 01/22/22   Cathren Laine, MD  sucralfate (CARAFATE) 1 g tablet Take 1 tablet (1 g total) by mouth 4 (four) times daily -  with meals and at bedtime for 14 days. 09/15/21 09/29/21  Pokhrel, Rebekah Chesterfield, MD  venlafaxine XR (EFFEXOR-XR) 150 MG 24 hr capsule Take 150 mg by mouth  at bedtime.    [provider]      Allergies    Gemfibrozil, Nitroglycerin, Other, Pravastatin, Sulfamethoxazole-trimethoprim, and Tyloxapol    Review of Systems   Review of Systems  Gastrointestinal:  Positive for hematochezia.  All other systems reviewed and are negative.   Physical Exam Updated Vital Signs BP (!) 146/80 (BP Location: Right Arm)   Pulse 72   Temp 98.1 F (36.7 C) (Oral)   Resp 16   SpO2 98%  Physical Exam Vitals and nursing note reviewed.  Constitutional:      General: She is not in acute distress.    Appearance: Normal appearance. She is well-developed.  HENT:     Head: Normocephalic and atraumatic.  Eyes:     Conjunctiva/sclera: Conjunctivae normal.     Pupils: Pupils are equal, round, and reactive to light.  Cardiovascular:     Rate and Rhythm: Normal rate and regular rhythm.     Heart sounds: Normal heart sounds.  Pulmonary:     Effort: Pulmonary effort is normal. No respiratory distress.     Breath sounds: Normal breath sounds.  Abdominal:     General: There is no distension.     Palpations: Abdomen is soft.     Tenderness: There is no abdominal tenderness.  Genitourinary:    Comments: Brown stool present on DRE.  Hemoccult negative. Musculoskeletal:        General: No deformity. Normal range of motion.     Cervical back: Normal range of motion and neck supple.  Skin:    General: Skin is warm and dry.  Neurological:     General: No focal deficit present.     Mental Status: She is alert and oriented to person, place, and time.     ED Results / Procedures / Treatments   Labs (all labs ordered are listed, but only abnormal results are displayed) Labs Reviewed  LACTIC ACID, PLASMA  CBC WITH DIFFERENTIAL/PLATELET  LIPASE, BLOOD  COMPREHENSIVE METABOLIC PANEL  LACTIC ACID, PLASMA  URINALYSIS, W/ REFLEX TO CULTURE (INFECTION SUSPECTED)  POC OCCULT BLOOD, ED  TYPE AND SCREEN    EKG EKG Interpretation  Date/Time:  Monday  Dec 06 2022 17:30:21 EDT Ventricular Rate:  75 PR Interval:  117 QRS Duration: 84 QT Interval:  376 QTC Calculation: 420 R Axis:   100 Text Interpretation: Sinus rhythm Borderline short PR interval Right axis deviation Confirmed by Kristine Royal [phone]) on 12/06/2022 5:37:49 PM  Radiology No results found.  Procedures Procedures    Medications Ordered in ED Medications  sodium chloride 0.9 % bolus 500 mL (0 mLs Intravenous Stopped 12/06/22 1920)    ED Course/ Medical Decision Making/ A&P  Medical Decision Making Amount and/or Complexity of Data Reviewed Labs: ordered. Radiology: ordered.  Risk Prescription drug management.    Medical Screen Complete  This patient presented to the ED with complaint of diarrhea, abdominal pain.  This complaint involves an extensive number of treatment options. The initial differential diagnosis includes, but is not limited to, viral versus bacterial infection, metabolic abnormality, etc.  This presentation is: Acute, Self-Limited, Previously Undiagnosed, Uncertain Prognosis, Complicated, Systemic Symptoms, and Threat to Life/Bodily Function  Patient with known history including COPD, depression, hypertension, hypothyroidism presents with significant number of loose stools for the last 48 to 72 hours.  Patient with multiple loose bowel movements during ED evaluation.  Patient without reported fever.  She is reporting significant abdominal cramps associated with her diarrheal illness.  She denies recent antibiotics.  She reports last use of prednisone was approximately 1 month ago.  Initial labs are reassuringly without significant abnormality.  CT imaging of the abdomen pelvis is without acute abnormality.  UA is concerning for possible early UTI.  Patient would benefit from admission for further workup and treatment.  Hospitalist service made aware of case and will evaluate for same.      Additional  history obtained:  Additional history obtained from EMS External records from outside sources obtained and reviewed including prior ED visits and prior Inpatient records.    Lab Tests:  I ordered and personally interpreted labs.  The pertinent results include: CBC, CMP, lipase, lactic acid, UA   Imaging Studies ordered:  I ordered imaging studies including CT abdomen pelvis I independently visualized and interpreted obtained imaging which showed an NAD I agree with the radiologist interpretation.   Cardiac Monitoring:  The patient was maintained on a cardiac monitor.  I personally viewed and interpreted the cardiac monitor which showed an underlying rhythm of: NSR   Medicines ordered:  I ordered medication including IVF  for suspected dehydration  Reevaluation of the patient after these medicines showed that the patient: improved  Problem List / ED Course:  Diarrhea    Reevaluation:  After the interventions noted above, I reevaluated the patient and found that they have: stayed the same  Disposition:  After consideration of the diagnostic results and the patients response to treatment, I feel that the patent would benefit from admission.          Final Clinical Impression(s) / ED Diagnoses Final diagnoses:  Diarrhea, unspecified type    Rx / DC Orders ED Discharge Orders     None         Wynetta Fines, MD 12/06/22 2355

## 2022-12-06 NOTE — ED Notes (Signed)
3 rd time this pt has been changed due to bowel movement. Pt keeps stating that they are able to tell us when they have to go, but has yet be able to tell us until she has already went and has felt it.

## 2022-12-06 NOTE — ED Notes (Signed)
Pt's sats maintained at 88. Pt stated she felt like she could not breathe. Pt placed on 2L [place]. Sats are now 93%. Hx COPD.

## 2022-12-07 ENCOUNTER — Encounter (HOSPITAL_COMMUNITY): Payer: Self-pay | Admitting: Internal Medicine

## 2022-12-07 DIAGNOSIS — I5032 Chronic diastolic (congestive) heart failure: Secondary | ICD-10-CM | POA: Diagnosis present

## 2022-12-07 DIAGNOSIS — A046 Enteritis due to Yersinia enterocolitica: Secondary | ICD-10-CM | POA: Diagnosis not present

## 2022-12-07 DIAGNOSIS — R197 Diarrhea, unspecified: Secondary | ICD-10-CM | POA: Diagnosis not present

## 2022-12-07 DIAGNOSIS — R531 Weakness: Secondary | ICD-10-CM

## 2022-12-07 LAB — CBC WITH DIFFERENTIAL/PLATELET
Abs Immature Granulocytes: 0.03 10*3/uL (ref 0.00–0.07)
Basophils Absolute: 0 10*3/uL (ref 0.0–0.1)
Basophils Relative: 1 %
Eosinophils Absolute: 0 10*3/uL (ref 0.0–0.5)
Eosinophils Relative: 0 %
HCT: 49.1 % — ABNORMAL HIGH (ref 36.0–46.0)
Hemoglobin: 14.8 g/dL (ref 12.0–15.0)
Immature Granulocytes: 0 %
Lymphocytes Relative: 30 %
Lymphs Abs: 2.4 10*3/uL (ref 0.7–4.0)
MCH: 29.2 pg (ref 26.0–34.0)
MCHC: 30.1 g/dL (ref 30.0–36.0)
MCV: 97 fL (ref 80.0–100.0)
Monocytes Absolute: 0.4 10*3/uL (ref 0.1–1.0)
Monocytes Relative: 4 %
Neutro Abs: 5.1 10*3/uL (ref 1.7–7.7)
Neutrophils Relative %: 65 %
Platelets: 207 10*3/uL (ref 150–400)
RBC: 5.06 MIL/uL (ref 3.87–5.11)
RDW: 23.4 % — ABNORMAL HIGH (ref 11.5–15.5)
WBC: 7.9 10*3/uL (ref 4.0–10.5)
nRBC: 0 % (ref 0.0–0.2)

## 2022-12-07 LAB — COMPREHENSIVE METABOLIC PANEL
ALT: 26 U/L (ref 0–44)
AST: 16 U/L (ref 15–41)
Albumin: 3.6 g/dL (ref 3.5–5.0)
Alkaline Phosphatase: 66 U/L (ref 38–126)
Anion gap: 8 (ref 5–15)
BUN: 10 mg/dL (ref 8–23)
CO2: 30 mmol/L (ref 22–32)
Calcium: 9.1 mg/dL (ref 8.9–10.3)
Chloride: 101 mmol/L (ref 98–111)
Creatinine, Ser: 0.95 mg/dL (ref 0.44–1.00)
GFR, Estimated: >60 mL/min (ref >60)
Glucose, Bld: 104 mg/dL — ABNORMAL HIGH (ref 70–99)
Potassium: 3.6 mmol/L (ref 3.5–5.1)
Sodium: 139 mmol/L (ref 135–145)
Total Bilirubin: 0.9 mg/dL (ref 0.3–1.2)
Total Protein: 6.8 g/dL (ref 6.5–8.1)

## 2022-12-07 LAB — GASTROINTESTINAL PANEL BY PCR, STOOL (REPLACES STOOL CULTURE)

## 2022-12-07 LAB — MRSA NEXT GEN BY PCR, NASAL: MRSA by PCR Next Gen: DETECTED — AB

## 2022-12-07 LAB — C DIFFICILE QUICK SCREEN W PCR REFLEX
C Diff antigen: NEGATIVE
C Diff interpretation: NOT DETECTED
C Diff toxin: NEGATIVE

## 2022-12-07 LAB — MAGNESIUM: Magnesium: 1.9 mg/dL (ref 1.7–2.4)

## 2022-12-07 LAB — VITAMIN B12: Vitamin B-12: 1290 pg/mL — ABNORMAL HIGH (ref 180–914)

## 2022-12-07 LAB — TSH: TSH: 0.319 u[IU]/mL — ABNORMAL LOW (ref 0.350–4.500)

## 2022-12-07 MED ORDER — VENLAFAXINE HCL ER 150 MG PO CP24
150.0000 mg | ORAL_CAPSULE | Freq: Every day | ORAL | Status: DC
  Administered 2022-12-07 – 2022-12-08 (×3): 150 mg via ORAL
  Filled 2022-12-07 (×3): qty 1

## 2022-12-07 MED ORDER — GABAPENTIN 300 MG PO CAPS: 300.0000 mg | ORAL_CAPSULE | Freq: Three times a day (TID) | ORAL | Status: DC

## 2022-12-07 MED ORDER — ALBUTEROL SULFATE (2.5 MG/3ML) 0.083% IN NEBU: 2.5000 mg | INHALATION_SOLUTION | RESPIRATORY_TRACT | Status: DC | PRN

## 2022-12-07 MED ORDER — UMECLIDINIUM BROMIDE 62.5 MCG/ACT IN AEPB
1.0000 | INHALATION_SPRAY | Freq: Every day | RESPIRATORY_TRACT | Status: DC
  Administered 2022-12-07 – 2022-12-09 (×3): 1 via RESPIRATORY_TRACT
  Filled 2022-12-07: qty 7

## 2022-12-07 MED ORDER — ARIPIPRAZOLE 10 MG PO TABS
10.0000 mg | ORAL_TABLET | Freq: Every day | ORAL | Status: DC
  Administered 2022-12-07 – 2022-12-09 (×3): 10 mg via ORAL
  Filled 2022-12-07 (×3): qty 1

## 2022-12-07 MED ORDER — ACETAMINOPHEN 325 MG PO TABS
650.0000 mg | ORAL_TABLET | Freq: Four times a day (QID) | ORAL | Status: DC | PRN
  Administered 2022-12-07 – 2022-12-08 (×2): 650 mg via ORAL
  Filled 2022-12-07 (×2): qty 2

## 2022-12-07 MED ORDER — ALPRAZOLAM 0.5 MG PO TABS: 1.0000 mg | ORAL_TABLET | Freq: Three times a day (TID) | ORAL | Status: DC | PRN

## 2022-12-07 MED ORDER — FLUTICASONE FUROATE-VILANTEROL 100-25 MCG/ACT IN AEPB
1.0000 | INHALATION_SPRAY | Freq: Every day | RESPIRATORY_TRACT | Status: DC
  Administered 2022-12-07 – 2022-12-09 (×3): 1 via RESPIRATORY_TRACT
  Filled 2022-12-07: qty 28

## 2022-12-07 MED ORDER — SODIUM CHLORIDE 0.9 % IV SOLN: INTRAVENOUS | Status: AC

## 2022-12-07 MED ORDER — OXYCODONE-ACETAMINOPHEN 5-325 MG PO TABS
1.0000 | ORAL_TABLET | Freq: Four times a day (QID) | ORAL | Status: DC | PRN
  Administered 2022-12-07 – 2022-12-08 (×4): 2 via ORAL
  Administered 2022-12-09: 1 via ORAL
  Filled 2022-12-07 (×3): qty 2
  Filled 2022-12-07: qty 1
  Filled 2022-12-07: qty 2

## 2022-12-07 MED ORDER — BUPROPION HCL ER (XL) 150 MG PO TB24: 150.0000 mg | ORAL_TABLET | Freq: Every evening | ORAL | Status: DC

## 2022-12-07 MED ORDER — ACETAMINOPHEN 650 MG RE SUPP: 650.0000 mg | Freq: Four times a day (QID) | RECTAL | Status: DC | PRN

## 2022-12-07 MED ORDER — LEVOTHYROXINE SODIUM 50 MCG PO TABS
150.0000 ug | ORAL_TABLET | Freq: Every day | ORAL | Status: DC
  Administered 2022-12-07 – 2022-12-09 (×3): 150 ug via ORAL
  Filled 2022-12-07 (×3): qty 1

## 2022-12-07 MED ORDER — MELATONIN 3 MG PO TABS
3.0000 mg | ORAL_TABLET | Freq: Every evening | ORAL | Status: DC | PRN
  Administered 2022-12-07: 3 mg via ORAL
  Filled 2022-12-07: qty 1

## 2022-12-07 MED ORDER — GABAPENTIN 300 MG PO CAPS
300.0000 mg | ORAL_CAPSULE | Freq: Three times a day (TID) | ORAL | Status: DC
  Administered 2022-12-07 – 2022-12-09 (×8): 300 mg via ORAL
  Filled 2022-12-07 (×8): qty 1

## 2022-12-07 MED ORDER — ATORVASTATIN CALCIUM 10 MG PO TABS
10.0000 mg | ORAL_TABLET | Freq: Every day | ORAL | Status: DC
  Administered 2022-12-07 – 2022-12-09 (×3): 10 mg via ORAL
  Filled 2022-12-07 (×4): qty 1

## 2022-12-07 MED ORDER — ONDANSETRON HCL 4 MG/2ML IJ SOLN
4.0000 mg | Freq: Four times a day (QID) | INTRAMUSCULAR | Status: DC | PRN
  Administered 2022-12-07: 4 mg via INTRAVENOUS
  Filled 2022-12-07: qty 2

## 2022-12-07 NOTE — TOC Initial Note (Signed)
Transition of Care [facility]) - Initial/Assessment Note    Patient Details  Name: [name] MRN: [id number] Date of Birth: 1955/12/13  Transition of Care [facility] CM/SW Contact:    Larrie Kass, LCSW Phone Number: 12/07/2022, 1:51 PM  Clinical Narrative:                 CSW spoke with pt about recommendations for home health. Pt reported she will have to speak with her sister to determine where she will be when she is discharged. Pt reports she will discuss HH rec with CSW after speaking with her. TOC to follow .   Expected Discharge Plan: Home w Home Health Services Barriers to Discharge: Continued Medical Work up   Patient Goals and CMS Choice Patient states their goals for this hospitalization and ongoing recovery are:: return home          Expected Discharge Plan and Services       Living arrangements for the past 2 months: Single Family Home                                      Prior Living Arrangements/Services Living arrangements for the past 2 months: Single Family Home Lives with:: Self, Siblings Patient language and need for interpreter reviewed:: Yes Do you feel safe going back to the place where you live?: Yes      Need for Family Participation in Patient Care: No (Comment) Care giver support system in place?: Yes (comment)   Criminal Activity/Legal Involvement Pertinent to Current Situation/Hospitalization: No - Comment as needed  Activities of Daily Living Home Assistive Devices/Equipment: Walker (specify type), Oxygen ADL Screening (condition at time of admission) Patient's cognitive ability adequate to safely complete daily activities?: Yes Is the patient deaf or have difficulty hearing?: No Does the patient have difficulty seeing, even when wearing glasses/contacts?: No Does the patient have difficulty concentrating, remembering, or making decisions?: No Patient able to express need for assistance with ADLs?: Yes Does the patient have  difficulty dressing or bathing?: Yes Independently performs ADLs?: No Communication: Independent Dressing (OT): Needs assistance Is this a change from baseline?: Change from baseline, expected to last <3days Grooming: Needs assistance Is this a change from baseline?: Change from baseline, expected to last <3 days Feeding: Independent Bathing: Needs assistance Is this a change from baseline?: Pre-admission baseline Toileting: Needs assistance Is this a change from baseline?: Change from baseline, expected to last <3 days In/Out Bed: Needs assistance Is this a change from baseline?: Change from baseline, expected to last <3 days Walks in Home: Independent with device (comment) Does the patient have difficulty walking or climbing stairs?: Yes Weakness of Legs: Both Weakness of Arms/Hands: None  Permission Sought/Granted                  Emotional Assessment Appearance:: Appears stated age Attitude/Demeanor/Rapport: Gracious Affect (typically observed): Accepting Orientation: : Oriented to Self, Oriented to Place, Oriented to  Time, Oriented to Situation      Admission diagnosis:  Diarrhea [R19.7] Diarrhea, unspecified type [R19.7] Patient Active Problem List   Diagnosis Date Noted   Chronic diastolic CHF (congestive heart failure) (HCC) 12/07/2022   Generalized weakness 12/07/2022   Diarrhea 12/06/2022   Open leg wound, right, sequela 09/06/2021   Hypokalemia 09/06/2021   Anemia, chronic disease 09/06/2021   Noncompliance 09/06/2021   Hyponatremia 09/06/2021   Leukocytosis 09/06/2021   Edentulous  09/06/2021   Home Infestation by bed bug 09/06/2021   COPD (chronic obstructive pulmonary disease) (HCC) 09/06/2021   Opioid dependence (HCC) 09/06/2021   Chronic hypotension 09/06/2021   Pressure injury of skin 08/17/2021   Sepsis (HCC) 08/15/2021   Septic shock (HCC) 08/15/2021   Cellulitis of right lower extremity    Wound cellulitis 06/16/2021   Acquired  hypothyroidism 06/16/2021   Hyperlipidemia 06/16/2021   Anxiety 06/16/2021   Fall at home, initial encounter 06/16/2021   Adult failure to thrive 06/16/2021   Arm mass, right 03/04/2021   Acute respiratory failure with hypoxia (HCC) 03/04/2021   COPD exacerbation (HCC) 03/01/2021   Acute exacerbation of chronic obstructive pulmonary disease (COPD) (HCC) 02/28/2021   Depression 04/04/2018   Venous insufficiency (chronic) (peripheral) 01/11/2018   BRONCHITIS, ACUTE 10/21/2008   Asthma 07/11/2007   GERD (gastroesophageal reflux disease) 07/11/2007   Sleep apnea 07/11/2007   Shortness of breath 07/11/2007   PCP:  Rodrigo Ran, MD Pharmacy:   [facility]- [place], [place] - [address] [place] [address] Phone: [phone] Fax: [phone]  [id number] - [place], [place] - [address] [place] [address] Phone: [phone] Fax: [phone]  [facility] [phone] - [place], [place] - [address] [place] [address] Phone: [phone] Fax: [phone]  [id number] - [place], [place] - [address] [facility] [facility] AT [facility] [address] [place] [address] Phone: [phone] Fax: [phone]     Social Determinants of Health (SDOH) Social History: SDOH Screenings   Food Insecurity: Food Insecurity Present (04/04/2018)  Transportation Needs: No Transportation Needs (04/04/2018)  Financial Resource Strain: Medium Risk (04/04/2018)  Physical Activity: Insufficiently Active (04/04/2018)  Stress: Stress Concern Present (04/04/2018)  Tobacco Use: Medium Risk (12/07/2022)   SDOH Interventions:     Readmission Risk Interventions    09/08/2021   11:20 AM  Readmission Risk Prevention Plan  Transportation Screening Complete  Medication Review (RN Care Manager) Complete  PCP or Specialist appointment within  3-5 days of discharge Complete  HRI or Home Care Consult Complete  SW Recovery Care/Counseling Consult Complete  Palliative Care Screening Not Applicable  Skilled Nursing Facility Complete

## 2022-12-07 NOTE — Progress Notes (Signed)
Physical Therapy Treatment Patient Details Name: [name] MRN: [id number] DOB: [birth year] Today's Date: 12/07/2022   History of Present Illness [name] is a 67 yr old female brought to the hospital with multiple episodes of diarrhea and generalized weakness. PMH: COPD on 2L at night, gout, hysterectomy, rotator cuff repair, chronic diastolic heart failure    PT Comments    Pt was able to transfer from bed to recliner with RW with supervision. Pt noted to be soiled with BM upon standing. Ambulation was deferred due to need for clean up, and pt was fatigued from ambulating with OT earlier this morning. At baseline pt ambulates with a rollator at home and denies falls in the past 6 months. I expect she will be able to return to her baseline.    Recommendations for follow up therapy are one component of a multi-disciplinary discharge planning process, led by the attending physician.  Recommendations may be updated based on patient status, additional functional criteria and insurance authorization.  Follow Up Recommendations       Assistance Recommended at Discharge Set up Supervision/Assistance  Patient can return home with the following A little help with bathing/dressing/bathroom;Assist for transportation;Help with stairs or ramp for entrance;Assistance with cooking/housework   Equipment Recommendations  None recommended by PT    Recommendations for Other Services       Precautions / Restrictions Precautions Precautions: Fall Precaution Comments: denies falls in past 6 months Restrictions Weight Bearing Restrictions: No     Mobility  Bed Mobility Overal bed mobility: Needs Assistance Bed Mobility: Supine to Sit     Supine to sit: Modified independent (Device/Increase time)          Transfers Overall transfer level: Needs assistance Equipment used: Rolling walker (2 wheels) Transfers: Sit to/from Stand, Bed to chair/wheelchair/BSC Sit to Stand: Supervision    Step pivot transfers: Supervision            Ambulation/Gait               General Gait Details: deferred, pt soiled with BM, pt reports she ambulated in the room with RW earlier this morning with OT   Stairs             Wheelchair Mobility    Modified Rankin (Stroke Patients Only)       Balance     Sitting balance-Leahy Scale: Good Sitting balance - Comments: good   Standing balance support: Bilateral upper extremity supported, During functional activity, Reliant on assistive device for balance Standing balance-Leahy Scale: Fair                              Cognition Arousal/Alertness: Awake/alert Behavior During Therapy: WFL for tasks assessed/performed Overall Cognitive Status: Within Functional Limits for tasks assessed                                 General Comments: Oriented to person, place, year, and situation; she reported the month to be June. Able to follow 1-2 step commands consistently        Exercises      General Comments        Pertinent Vitals/Pain Pain Assessment Pain Score: 8  Pain Location: headache Pain Descriptors / Indicators: Aching Pain Intervention(s): Limited activity within patient's tolerance, Monitored during session, Patient requesting pain meds-RN notified    Home Living Family/patient expects to  be discharged to:: Unsure Living Arrangements: Other relatives (Cousin and cousin's spouse)   Type of Home: Mobile home Home Access: Stairs to enter Entrance Stairs-Rails: Lawyer of Steps: 4   Home Layout: One level Home Equipment: Production designer, theatre/television/film (4 wheels) Additional Comments: Pt reported she does not desire to return to her prior living situation. She hopes to be able to move into her sister's home.    Prior Function            PT Goals (current goals can now be found in the care plan section) Acute Rehab PT Goals Patient Stated Goal:  to get stronger PT Goal Formulation: With patient Time For Goal Achievement: 12/21/22 Potential to Achieve Goals: Good    Frequency    Min 1X/week      PT Plan      Co-evaluation              AM-PAC PT "6 Clicks" Mobility   Outcome Measure  Help needed turning from your back to your side while in a flat bed without using bedrails?: A Little Help needed moving from lying on your back to sitting on the side of a flat bed without using bedrails?: A Little Help needed moving to and from a bed to a chair (including a wheelchair)?: A Little Help needed standing up from a chair using your arms (e.g., wheelchair or bedside chair)?: A Little Help needed to walk in hospital room?: A Little Help needed climbing 3-5 steps with a railing? : A Little 6 Click Score: 18    End of Session Equipment Utilized During Treatment: Gait belt Activity Tolerance: Patient limited by fatigue;Patient limited by pain Patient left: in chair;with call bell/phone within reach Nurse Communication: Mobility status PT Visit Diagnosis: Pain;Difficulty in walking, not elsewhere classified (R26.2)     Time: 4098-1191 PT Time Calculation (min) (ACUTE ONLY): 14 min  Charges:  $Therapeutic Activity: 8-22 mins                     Ralene Bathe Kistler PT 12/07/2022  Acute Rehabilitation Services  Office [phone]

## 2022-12-07 NOTE — Progress Notes (Signed)
PROGRESS NOTE    [name]  ZOX:[id number] DOB: [birth year] DOA: 12/06/2022 PCP: Rodrigo Ran, MD    Brief Narrative:  67 year old with history of COPD on 2 L nocturnal oxygen, hypothyroidism, hyperlipidemia, GERD, chronic diastolic dysfunction admitted to [facility] with 5 days of intractable diarrhea, nausea and lower abdominal pain did not improve with treatment at home.  Felt very weak.  No sick contacts.  In the emergency room hemodynamically stable.  Electrolytes is stable.  WBC count normal.  Lactic acid 0.9.  CT scan abdomen pelvis with no evidence of acute intra-abdominal pathology.  Admitted due to significant diarrhea and dehydration.   Assessment & Plan:   Nausea vomiting and diarrhea: Likely viral gastroenteritis.  Some clinical improvement today. Advance to regular diet today. Check C. difficile and GI pathogen panel. Continue maintenance IV fluids today.  Encourage oral intake. Mobilize.  Chronic medical issues including COPD and nocturnal hypoxemia on 2 L oxygen, stable.  On as needed albuterol.  On trelegy. Hyperlipidemia, on statin. Hypothyroidism, on Synthroid. History of depression, on Wellbutrin and gabapentin. GERD, PPI Chronic diastolic dysfunction, currently on IV fluids.  Addendum: Stool studies reported. Negative for C. difficile. Positive for Yersinia enterocolitica, continue supportive management.  Patient with already improving diarrhea symptoms, there is no benefit with antibiotics.  If recurrent diarrhea or abdominal pain, will treat with Levaquin, she is allergic to Bactrim.   DVT prophylaxis: SCDs Start: 12/07/22 0015   Code Status: DNR, documented on admission Family Communication: None at bedside Disposition Plan: Status is: Observation The patient will require care spanning > 2 midnights and should be moved to inpatient because: IV fluids, monitoring, mobility     Consultants:  None  Procedures:  None  Antimicrobials:   None   Subjective: Patient seen in the morning rounds.  Patient tells me that she was able to eat some meals today.  He still has some crampy abdominal pain.  1 loose stool early morning today.  Objective: Vitals:   12/06/22 2245 12/06/22 2300 12/07/22 0154 12/07/22 0630  BP: 115/73 126/68 (!) 144/96 92/68  Pulse: 74 77 93 91  Resp: 17 18 20 17   Temp:   98.6 F (37 C) 98.9 F (37.2 C)  TempSrc:   Oral Oral  SpO2: 95% 93% 95% 92%  Weight:   88.1 kg   Height:   5\' 4"  (1.626 m)     Intake/Output Summary (Last 24 hours) at 12/07/2022 1109 Last data filed at 12/07/2022 0500 Gross per 24 hour  Intake 314.28 ml  Output --  Net 314.28 ml   Filed Weights   12/07/22 0154  Weight: 88.1 kg    Examination:  General: Looks fairly comfortable.  Frail and debilitated. Cardiovascular: S1-S2 normal.  Regular rate rhythm. Respiratory: Bilateral clear.  No added sounds. Gastrointestinal: Soft.  Bowel sound present.  Mild tenderness along the suprapubic area.  No rigidity or guarding. Ext: No swelling or edema.  No cyanosis. Neuro: Intact. Musculoskeletal: No deformities.      Data Reviewed: I have personally reviewed following labs and imaging studies  CBC: Recent Labs  Lab 12/06/22 1717 12/07/22 0259  WBC 7.8 7.9  NEUTROABS 5.9 5.1  HGB 15.0 14.8  HCT 49.4* 49.1*  MCV 98.0 97.0  PLT 210 207   Basic Metabolic Panel: Recent Labs  Lab 12/06/22 1717 12/07/22 0259  NA 141 139  K 3.9 3.6  CL 101 101  CO2 30 30  GLUCOSE 121* 104*  BUN 11 10  CREATININE 0.81 0.95  CALCIUM 9.3 9.1  MG  --  1.9   GFR: Estimated Creatinine Clearance: 62.6 mL/min (by C-G formula based on SCr of 0.95 mg/dL). Liver Function Tests: Recent Labs  Lab 12/06/22 1717 12/07/22 0259  AST 24 16  ALT 26 26  ALKPHOS 65 66  BILITOT 1.2 0.9  PROT 7.1 6.8  ALBUMIN 3.7 3.6   Recent Labs  Lab 12/06/22 1717  LIPASE 20   No results for input(s): "AMMONIA" in the last 168 hours. Coagulation  Profile: No results for input(s): "INR", "PROTIME" in the last 168 hours. Cardiac Enzymes: No results for input(s): "CKTOTAL", "CKMB", "CKMBINDEX", "TROPONINI" in the last 168 hours. BNP (last 3 results) No results for input(s): "PROBNP" in the last 8760 hours. HbA1C: No results for input(s): "HGBA1C" in the last 72 hours. CBG: No results for input(s): "GLUCAP" in the last 168 hours. Lipid Profile: No results for input(s): "CHOL", "HDL", "LDLCALC", "TRIG", "CHOLHDL", "LDLDIRECT" in the last 72 hours. Thyroid Function Tests: Recent Labs    12/07/22 0259  TSH 0.319*   Anemia Panel: Recent Labs    12/07/22 0758  VITAMINB12 1,290*   Sepsis Labs: Recent Labs  Lab 12/06/22 1717  LATICACIDVEN 0.9    Recent Results (from the past 240 hour(s))  C Difficile Quick Screen w PCR reflex     Status: None   Collection Time: 12/07/22  2:05 AM   Specimen: STOOL  Result Value Ref Range Status   C Diff antigen NEGATIVE NEGATIVE Final   C Diff toxin NEGATIVE NEGATIVE Final   C Diff interpretation No C. difficile detected.  Final    Comment: Performed at [facility], [address] [place], [place] [address]  Gastrointestinal Panel by PCR , Stool     Status: Abnormal   Collection Time: 12/07/22  2:06 AM   Specimen: STOOL  Result Value Ref Range Status   Campylobacter species NOT DETECTED NOT DETECTED Final   Plesimonas shigelloides NOT DETECTED NOT DETECTED Final   Salmonella species NOT DETECTED NOT DETECTED Final   Yersinia enterocolitica DETECTED (A) NOT DETECTED Final    Comment: RESULT CALLED TO, READ BACK BY AND VERIFIED WITH: ZOEY RUSSELL RN 1002 12/07/22 HNM    Vibrio species NOT DETECTED NOT DETECTED Final   Vibrio cholerae NOT DETECTED NOT DETECTED Final   Enteroaggregative E coli (EAEC) NOT DETECTED NOT DETECTED Final   Enteropathogenic E coli (EPEC) NOT DETECTED NOT DETECTED Final   Enterotoxigenic E coli (ETEC) NOT DETECTED NOT DETECTED Final   Shiga  like toxin producing E coli (STEC) NOT DETECTED NOT DETECTED Final   Shigella/Enteroinvasive E coli (EIEC) NOT DETECTED NOT DETECTED Final   Cryptosporidium NOT DETECTED NOT DETECTED Final   Cyclospora cayetanensis NOT DETECTED NOT DETECTED Final   Entamoeba histolytica NOT DETECTED NOT DETECTED Final   Giardia lamblia NOT DETECTED NOT DETECTED Final   Adenovirus F40/41 NOT DETECTED NOT DETECTED Final   Astrovirus NOT DETECTED NOT DETECTED Final   Norovirus GI/GII NOT DETECTED NOT DETECTED Final   Rotavirus A NOT DETECTED NOT DETECTED Final   Sapovirus (I, II, IV, and V) NOT DETECTED NOT DETECTED Final    Comment: Performed at [facility], [address] [place], [place] [address]         Radiology Studies: CT ABDOMEN PELVIS W CONTRAST  Result Date: 12/06/2022 CLINICAL DATA:  Abdominal pain, acute, nonlocalized, urinary frequency, melena EXAM: CT ABDOMEN AND PELVIS WITH CONTRAST TECHNIQUE: Multidetector CT imaging of  the abdomen and pelvis was performed using the standard protocol following bolus administration of intravenous contrast. RADIATION DOSE REDUCTION: This exam was performed according to the departmental dose-optimization program which includes automated exposure control, adjustment of the mA and/or kV according to patient size and/or use of iterative reconstruction technique. CONTRAST:  OMNIPAQUE IOHEXOL 300 MG/ML  SOLN COMPARISON:  06/16/2021 FINDINGS: Lower chest: No acute abnormality. Hepatobiliary: No focal liver abnormality is seen. Status post cholecystectomy. No biliary dilatation. Pancreas: Unremarkable Spleen: Unremarkable Adrenals/Urinary Tract: Adrenal glands are unremarkable. Kidneys are normal, without renal calculi, focal lesion, or hydronephrosis. Bladder is unremarkable. Stomach/Bowel: Stomach is within normal limits. Appendix appears normal. No evidence of bowel wall thickening, distention, or inflammatory changes. Vascular/Lymphatic: Aortic  atherosclerosis. No enlarged abdominal or pelvic lymph nodes. Reproductive: Status post hysterectomy. No adnexal masses. Other: No abdominal wall hernia or abnormality. No abdominopelvic ascites. Musculoskeletal: Degenerative changes are seen within the lumbar spine. Resultant multilevel neuroforaminal narrowing and high-grade central canal stenosis at L3-4, L4-5, and L5-S1, not well evaluated on this examination. No acute bone abnormality. IMPRESSION: 1. No acute intra-abdominal pathology identified. No definite radiographic explanation for the patient's reported symptoms. 2. 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 If indicated, this could be better assessed with MRI examination. Aortic Atherosclerosis (ICD10-I70.0). Electronically Signed   By: Helyn Numbers M.D.   On: 12/06/2022 22:34        Scheduled Meds:  atorvastatin  10 mg Oral Daily   fluticasone furoate-vilanterol  1 puff Inhalation Daily   And   umeclidinium bromide  1 puff Inhalation Daily   gabapentin  300 mg Oral TID   levothyroxine  150 mcg Oral Q0600   venlafaxine XR  150 mg Oral QHS   Continuous Infusions:  sodium chloride 100 mL/hr at 12/07/22 0303     LOS: 0 days    Time spent: 35 minutes    Dorcas Carrow, MD [facility] Pager [phone]

## 2022-12-07 NOTE — H&P (Signed)
History and Physical      JANICE SCHIEDEL ZOX:[id number] DOB: January 22, 1956 DOA: 12/06/2022; DOS: 12/07/2022  PCP: Rodrigo Ran, MD  Patient coming from: home   I have personally briefly reviewed patient's old medical records in [facility]  Chief Complaint: Diarrhea  HPI: [name] is a 67 y.o. female with medical history significant for COPD on 2 L nocturnal nasal cannula, hyperlipidemia, acquired hypothyroidism, GERD, chronic diastolic heart failure, who is admitted to [facility] on 12/06/2022 with diarrhea after presenting from home to [facility] ED complaining of diarrhea.   The patient reports loose stool over each of the last 3 days, noting at least 4-5 such episodes of loose watery stool per day over that timeframe, while conveying that the frequency of these episodes appears to be escalating.  She notes greater than 6 episodes of loose watery stools a day.  She denies any associated melena or hematochezia.  No assist with any nausea/vomiting, but it conveys generalized crampy abdominal discomfort over that timeframe.  No recent trauma or travel.  Denies any use of antibiotics over the course of the last month, and also denies any use of steroids over that timeframe.  Not associate with any subjective fever, chills, rigors, or generalized myalgias.  No recent chest pain, shortness of breath, palpitations, diaphoresis, dizziness, presyncope, or syncope.  Over the last 1 to 2 days, she notes generalized weakness in the absence of any acute focal weakness.  Not associated with any resultant falls.  No rash, no headache, nor any recent neck stiffness.  In the setting of this recurrent diarrhea over the course of the last 3 days the patient contacted EMS who brought the patient to [facility] emergency department for further evaluation and management thereof.  She was noted to have an episode of loose watery stool with EMS and route to [facility] long ED.  While in the ED, she is experienced 3  additional episodes of loose watery stool.  Medical history notable for chronic diastolic heart failure, with most recent echocardiogram performed in January 2023, notable for LVEF 55%, no focal wall motion MIs, grade 1 diastolic/, normal right ventricular systolic function, and no evidence of significant valvular pathology.  Not on any scheduled diuretic medications at home.    ED Course:  Vital signs in the ED were notable for the following: Afebrile; heart rate 70 to 80s; systolic blood pressures in the 1 teens to 140s; respiratory rate 16-20, oxygen saturation 93 to 95% on room air.  Labs were notable for the following: CMP notable for the following: Sodium 141, potassium 3.9, bicarbonate 30, creatinine 0.81, glucose 121, liver enzymes within normal limits.  Lipase 20.  CBC notable for white cell count 7800 with 76% neutrophils.  Lactic acid 0.9.  Urinalysis notable for no blood cells, leukocyte esterase negative.  Fecal occult blood test was negative.  C. difficile PCR has been ordered, with result pending.  Per my interpretation, EKG in ED demonstrated the following: Sinus rhythm with heart rate 75, normal intervals, no evidence of T wave or ST changes, including no evidence of ST elevation.  Imaging and additional notable ED work-up: CT abdomen/pelvis with contrast showed no evidence of acute intra-abdominal or acute intrapelvic process.   While in the ED, the following were administered: Normal saline x 1 L bolus followed by continuous NS running at 125 cc/h  Subsequently, the patient was admitted for further evaluation management of presenting diarrhea, with presentation also notable for generalized weakness.  Review of Systems: As per HPI otherwise 10 point review of systems negative.   Past Medical History:  Diagnosis Date   COPD (chronic obstructive pulmonary disease) (HCC)    Gout    Hypercholesteremia    Sepsis (HCC)     Past Surgical History:  Procedure Laterality  Date   ABDOMINAL HYSTERECTOMY     ROTATOR CUFF REPAIR      Social History:  reports that she quit smoking about 8 months ago. Her smoking use included cigarettes. She has a 50.00 pack-year smoking history. She has never used smokeless tobacco. She reports that she does not drink alcohol and does not use drugs.   Allergies  Allergen Reactions   Gemfibrozil     Other reaction(s): Sick to stomach   Nitroglycerin     Other reaction(s): Cough, Irritation   Other Other (See Comments)    Grape fruit juice - unknown   Pravastatin     Other reaction(s): Dizzy   Sulfamethoxazole-Trimethoprim     Other reaction(s): N/V   Tyloxapol     Other reaction(s): Does not work    History reviewed. No pertinent family history.  Family history reviewed and not pertinent    Prior to Admission medications   Medication Sig Start Date End Date Taking? Authorizing Provider  acetaminophen (TYLENOL) 325 MG tablet Take 2 tablets (650 mg total) by mouth every 6 (six) hours as needed for mild pain (or Fever >/= 101). 09/15/21   Pokhrel, Rebekah Chesterfield, MD  atorvastatin (LIPITOR) 10 MG tablet Take 10 mg by mouth daily.    [provider]  Budeson-Glycopyrrol-Formoterol (BREZTRI AEROSPHERE) 160-9-4.8 MCG/ACT AERO Inhale 2 puffs into the lungs in the morning and at bedtime. 10/14/22   Omar Person, MD  buPROPion (WELLBUTRIN XL) 150 MG 24 hr tablet Take 150 mg by mouth every evening.     [provider]  feeding supplement (ENSURE ENLIVE / ENSURE PLUS) LIQD Take 237 mLs by mouth daily. 09/04/21   Narda Bonds, MD  fluconazole (DIFLUCAN) 100 MG tablet Take 1 tablet (100 mg total) by mouth daily. 10/14/22   Omar Person, MD  Fluticasone-Umeclidin-Vilant (TRELEGY ELLIPTA) 100-62.5-25 MCG/ACT AEPB Inhale 1 puff into the lungs daily. 10/21/22   Omar Person, MD  gabapentin (NEURONTIN) 300 MG capsule Take 300 mg by mouth 3 (three) times daily.    [provider]  hydrocortisone cream  0.5 % Apply topically 3 (three) times daily as needed for itching. 09/15/21   Pokhrel, Rebekah Chesterfield, MD  ipratropium-albuterol (DUONEB) 0.5-2.5 (3) MG/3ML SOLN use 1 vial by nebulization every 4 (four) hours as needed. Patient taking differently: Take 3 mLs by nebulization every 4 (four) hours as needed (wheezing). 03/06/21   Narda Bonds, MD  levothyroxine (SYNTHROID) 150 MCG tablet Take 150 mcg by mouth daily before breakfast.    [provider]  lidocaine (XYLOCAINE) 2 % solution Use as directed 15 mLs in the mouth or throat as needed for mouth pain. 09/04/21   Narda Bonds, MD  liver oil-zinc oxide (DESITIN) 40 % ointment Apply topically 4 (four) times daily. Patient not taking: Reported on 09/06/2021 06/23/21   Azucena Fallen, MD  Multiple Vitamin (MULTIVITAMIN WITH MINERALS) TABS tablet Take 1 tablet by mouth daily. 09/15/21   Pokhrel, Rebekah Chesterfield, MD  ondansetron (ZOFRAN) 4 MG tablet Take 1 tablet (4 mg total) by mouth every 6 (six) hours as needed for nausea. 09/15/21   Pokhrel, Rebekah Chesterfield, MD  pantoprazole (PROTONIX) 40 MG tablet Take  1 tablet (40 mg total) by mouth 2 (two) times daily before a meal for 14 days, THEN 1 tablet (40 mg total) daily for 14 days. 09/15/21 10/13/21  Pokhrel, Rebekah Chesterfield, MD  polyethylene glycol (MIRALAX / GLYCOLAX) 17 g packet Take 17 g by mouth 2 (two) times daily. Decreased to once daily if having diarrhea. 09/04/21   Narda Bonds, MD  potassium chloride SA (KLOR-CON M) 20 MEQ tablet One po bid x 3 days, then one po once per day 01/22/22   Cathren Laine, MD  sucralfate (CARAFATE) 1 g tablet Take 1 tablet (1 g total) by mouth 4 (four) times daily -  with meals and at bedtime for 14 days. 09/15/21 09/29/21  Pokhrel, Rebekah Chesterfield, MD  venlafaxine XR (EFFEXOR-XR) 150 MG 24 hr capsule Take 150 mg by mouth at bedtime.    [provider]     Objective    Physical Exam: Vitals:   12/06/22 2043 12/06/22 2234 12/06/22 2245 12/06/22 2300  BP:  133/68 115/73 126/68  Pulse:   77 74 77  Resp:  20 17 18   Temp: 98.5 F (36.9 C) 98.3 F (36.8 C)    TempSrc: Oral Oral    SpO2:  92% 95% 93%    General: appears to be stated age; alert, oriented Skin: warm, dry, no rash Head:  AT/[place] Mouth:  Oral mucosa membranes appear dry, normal dentition Neck: supple; trachea midline Heart:  RRR; did not appreciate any M/R/G Lungs: CTAB, did not appreciate any wheezes, rales, or rhonchi Abdomen: + BS; soft, ND, NT Vascular: 2+ pedal pulses b/l; 2+ radial pulses b/l Extremities: no peripheral edema, no muscle wasting Neuro: strength and sensation intact in upper and lower extremities b/l    Labs on Admission: I have personally reviewed following labs and imaging studies  CBC: Recent Labs  Lab 12/06/22 1717  WBC 7.8  NEUTROABS 5.9  HGB 15.0  HCT 49.4*  MCV 98.0  PLT 210   Basic Metabolic Panel: Recent Labs  Lab 12/06/22 1717  NA 141  K 3.9  CL 101  CO2 30  GLUCOSE 121*  BUN 11  CREATININE 0.81  CALCIUM 9.3   GFR: CrCl cannot be calculated (Unknown ideal weight.). Liver Function Tests: Recent Labs  Lab 12/06/22 1717  AST 24  ALT 26  ALKPHOS 65  BILITOT 1.2  PROT 7.1  ALBUMIN 3.7   Recent Labs  Lab 12/06/22 1717  LIPASE 20   No results for input(s): "AMMONIA" in the last 168 hours. Coagulation Profile: No results for input(s): "INR", "PROTIME" in the last 168 hours. Cardiac Enzymes: No results for input(s): "CKTOTAL", "CKMB", "CKMBINDEX", "TROPONINI" in the last 168 hours. BNP (last 3 results) No results for input(s): "PROBNP" in the last 8760 hours. HbA1C: No results for input(s): "HGBA1C" in the last 72 hours. CBG: No results for input(s): "GLUCAP" in the last 168 hours. Lipid Profile: No results for input(s): "CHOL", "HDL", "LDLCALC", "TRIG", "CHOLHDL", "LDLDIRECT" in the last 72 hours. Thyroid Function Tests: No results for input(s): "TSH", "T4TOTAL", "FREET4", "T3FREE", "THYROIDAB" in the last 72 hours. Anemia Panel: No  results for input(s): "VITAMINB12", "FOLATE", "FERRITIN", "TIBC", "IRON", "RETICCTPCT" in the last 72 hours. Urine analysis:    Component Value Date/Time   COLORURINE YELLOW 12/06/2022 1717   APPEARANCEUR HAZY (A) 12/06/2022 1717   LABSPEC 1.012 12/06/2022 1717   PHURINE 8.0 12/06/2022 1717   GLUCOSEU NEGATIVE 12/06/2022 1717   HGBUR SMALL (A) 12/06/2022 1717   BILIRUBINUR NEGATIVE 12/06/2022 1717  KETONESUR NEGATIVE 12/06/2022 1717   PROTEINUR NEGATIVE 12/06/2022 1717   UROBILINOGEN 0.2 04/14/2011 1651   NITRITE POSITIVE (A) 12/06/2022 1717   LEUKOCYTESUR NEGATIVE 12/06/2022 1717    Radiological Exams on Admission: CT ABDOMEN PELVIS W CONTRAST  Result Date: 12/06/2022 CLINICAL DATA:  Abdominal pain, acute, nonlocalized, urinary frequency, melena EXAM: CT ABDOMEN AND PELVIS WITH CONTRAST TECHNIQUE: Multidetector CT imaging of the abdomen and pelvis was performed using the standard protocol following bolus administration of intravenous contrast. RADIATION DOSE REDUCTION: This exam was performed according to the departmental dose-optimization program which includes automated exposure control, adjustment of the mA and/or kV according to patient size and/or use of iterative reconstruction technique. CONTRAST:  OMNIPAQUE IOHEXOL 300 MG/ML  SOLN COMPARISON:  06/16/2021 FINDINGS: Lower chest: No acute abnormality. Hepatobiliary: No focal liver abnormality is seen. Status post cholecystectomy. No biliary dilatation. Pancreas: Unremarkable Spleen: Unremarkable Adrenals/Urinary Tract: Adrenal glands are unremarkable. Kidneys are normal, without renal calculi, focal lesion, or hydronephrosis. Bladder is unremarkable. Stomach/Bowel: Stomach is within normal limits. Appendix appears normal. No evidence of bowel wall thickening, distention, or inflammatory changes. Vascular/Lymphatic: Aortic atherosclerosis. No enlarged abdominal or pelvic lymph nodes. Reproductive: Status post hysterectomy. No adnexal  masses. Other: No abdominal wall hernia or abnormality. No abdominopelvic ascites. Musculoskeletal: Degenerative changes are seen within the lumbar spine. Resultant multilevel neuroforaminal narrowing and high-grade central canal stenosis at L3-4, L4-5, and L5-S1, not well evaluated on this examination. No acute bone abnormality. IMPRESSION: 1. No acute intra-abdominal pathology identified. No definite radiographic explanation for the patient's reported symptoms. 2. 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 If indicated, this could be better assessed with MRI examination. Aortic Atherosclerosis (ICD10-I70.0). Electronically Signed   By: Helyn Numbers M.D.   On: 12/06/2022 22:34      Assessment/Plan    Principal Problem:   Diarrhea Active Problems:   GERD (gastroesophageal reflux disease)   Depression   Acquired hypothyroidism   Hyperlipidemia   COPD (chronic obstructive pulmonary disease) (HCC)   Chronic diastolic CHF (congestive heart failure) (HCC)   Generalized weakness    #) Acute Diarrhea: At least 3-4 loose watery bowel movements over each of the last 3 days, with escalating frequency.  No evidence of associated melena or hematochezia.  This is associated with mild crampy abdominal discomfort, but with CT abdomen/pelvis showed no evidence of acute process, including no evidence of colitis.  Does not possess any SIRS criteria, and appears hemodynamically stable.  Was also notable for not being associate any nausea/vomiting.  She has previously been prescribed Protonix, which increases risk for translocation of gut bacteria, serving as a C. difficile risk factor.  However, not on any recent biotics to further increase her risk factor of such.  No recent travel.  No recent sick contacts.  Aside from Protonix, no overt outpatient pharmacologic contributing factors  identified.  Overall, at this time, it is unclear if the patient's diarrhea is infectious versus osmotic.  C. difficile is currently pending.  Will also check GI panel by PCR to further evaluate for infectious possibilities.  While waiting results of this infectious workup, will refrain from antimotility agents.  Plan: Follow for results of stool studies, as above including C. difficile PCR.  Check GI panel by PCR.  Will refrain from use of anti-motility agents until underlying infectious source as been ruled-out. There does not appear to an indication for initiation of  abx at this time. Enteric precautions.  In the context of her history of chronic diastolic heart failure, will reduce rate of her NS from 125 cc/h to 100 cc/h.. Close monitoring of ensuing electrolytes and renal function with repeat CMP in the morning. Check Mg. Repeat CBC in the morning. Check TSH. Monitor strict I&O's and daily weights. Hold potential contributory meds, including Protonix.                 #) Generalized weakness: 1 to 2-day duration of generalized weakness, in the absence of any evidence of acute focal neurologic deficits, including no evidence of acute focal weakness to suggest acute CVA.  Suspect contribution from physiologic stress stemming from presenting increasing frequency of loose stool with resultant relative dehydration.  No e/o additional infectious process at this time, including urinalysis that was inconsistent with UTI, will CT Abdo/pelvis shows no evidence of acute process.  No acute respiratory symptoms to warrant chest x-ray evaluation at this time.  Will further eval for any additional contributions from endocrine/metabolic sources, as detailed below.   Plan: work-up and management of presenting diarrhea, as described above. PT/OT consults ordered for the AM. Fall precautions. CMP/CBC in the AM. Check TSH, serum Mg level. Check B12 level.              #) COPD: Documented history  thereof, without clinical evidence of acute exacerbation at this time.  She uses 2 L nocturnal supplemental oxygen, but denies any known diurnal oxygen requirements at baseline. outpatient respiratory regimen includes the following: Trelegy.  This is on the context of a long prior smoking history with a 50-pack-year history of such, before completely quitting smoking in 2023.  Plan: cont outpatient Trelegy. Prn albuterol nebulizer. Check CMP and serum magnesium level in the AM.              #) Hyperlipidemia: documented h/o such. On atorvastatin as outpatient.   Plan: continue home statin.              #) acquired hypothyroidism: documented h/o such, on Synthroid as outpatient.  In the context of her presenting diarrhea, will also check TSH level.  Plan: cont home Synthroid.  Check TSH.                #) Depression: documented h/o such. On Wellbutrin, gabapentin as outpatient.    Plan: Continue outpatient Wellbutrin and gabapentin.  Follow-up result of TSH.              #) GERD: documented h/o such; on Protonix as outpatient.  In the setting of the patient's presenting diarrhea, will hold home Protonix for now.  Plan: Hold home PPI.                  #) Chronic diastolic heart failure: documented history of such, with most recent echocardiogram performed in January 2023, which was notable for LVEF 55%, grade 1 diastolic dysfunction, normal right ventricular systolic function, and additional results as conveyed above. No clinical evidence to suggest acutely decompensated heart failure at this time. home diuretic regimen reportedly consists of the following: None.   Plan: monitor strict I's & O's and daily weights. Repeat CMP in AM. Check serum mag level.      DVT prophylaxis: SCD's   Code Status: DNR , consistent w/ two most recent hospitalizations and confirmed by patient. Family Communication: none Disposition Plan: Per  Rounding Team Consults called: none;  Admission status: obs    I  SPENT GREATER THAN 75  MINUTES IN CLINICAL CARE TIME/MEDICAL DECISION-MAKING IN COMPLETING THIS ADMISSION.     Chaney Born Ector Laurel DO [facility] From 7PM - 7AM   12/07/2022, 12:05 AM

## 2022-12-07 NOTE — Evaluation (Signed)
Occupational Therapy Evaluation Patient Details Name: DECLYN JARDON MRN: [id number] DOB: [birth year] Today's Date: 12/07/2022   History of Present Illness Ms. Forness is a 67 yr old female brought to the hospital with multiple episodes of diarrhea and generalized weakness. PMH: COPD on 2L at night, gout, hysterectomy, rotator cuff repair, chronic diastolic heart failure   Clinical Impression   Ms. Gershenson is currently presenting slightly below her baseline level of functioning for self-care management, as she is limited by mild deconditioning & generalized strength deficits, feelings of malaise, intermittent unsteadiness in dynamic standing, and reports of increased headache and abdominal pain. She required SBA to min guard assist for tasks, including sit to stand, lower body dressing, and ambulating in her room using a RW. She will benefit from further OT services in the acute care setting to maximize her ADL performance and to decrease the risk for restricted participation in meaningful activities.       Recommendations for follow up therapy are one component of a multi-disciplinary discharge planning process, led by the attending physician.  Recommendations may be updated based on patient status, additional functional criteria and insurance authorization.   Assistance Recommended at Discharge Intermittent Supervision/Assistance  Patient can return home with the following Assistance with cooking/housework;Assist for transportation;Help with stairs or ramp for entrance    Functional Status Assessment  Patient has had a recent decline in their functional status and demonstrates the ability to make significant improvements in function in a reasonable and predictable amount of time.  Equipment Recommendations  None recommended by OT       Precautions / Restrictions Precautions Precautions: Fall Precaution Comments: denies falls in past 6 months Restrictions Weight Bearing Restrictions: No       Mobility Bed Mobility Overal bed mobility: Needs Assistance Bed Mobility: Supine to Sit     Supine to sit: Modified independent (Device/Increase time)          Transfers Overall transfer level: Needs assistance Equipment used: Rolling walker (2 wheels) Transfers: Sit to/from Stand Sit to Stand: Supervision                  Balance       Sitting balance - Comments: good       Standing balance comment: SBA to min guard using a RW                           ADL either performed or assessed with clinical judgement   ADL Overall ADL's : Needs assistance/impaired Eating/Feeding: Independent Eating/Feeding Details (indicate cue type and reason): based on clinical judgement Grooming: Set up;Sitting Grooming Details (indicate cue type and reason): simulated seated EOB         Upper Body Dressing : Set up;Sitting Upper Body Dressing Details (indicate cue type and reason): simulated seated EOB Lower Body Dressing: Min guard;Sit to/from stand                       Vision Patient Visual Report: No change from baseline              Pertinent Vitals/Pain Pain Assessment Pain Assessment: 0-10 Pain Score: 8  Pain Location: headache and abdomen Pain Intervention(s): Other (comment) (She reported informing her nurse of her need for pain medication.)     Hand Dominance Right   Extremity/Trunk Assessment Upper Extremity Assessment Upper Extremity Assessment: Overall WFL for tasks assessed   Lower Extremity Assessment  Communication Communication Communication: HOH (not severe)   Cognition Arousal/Alertness: Awake/alert Behavior During Therapy: WFL for tasks assessed/performed Overall Cognitive Status: Within Functional Limits for tasks assessed        General Comments: Oriented to person, place, year, and situation; she reported the month to be June. Able to follow 1-2 step commands consistently                Home  Living Family/patient expects to be discharged to:: Unsure Living Arrangements: Other relatives (Cousin and cousin's spouse)   Type of Home: Mobile home Home Access: Stairs to enter Secretary/administrator of Steps: 4 Entrance Stairs-Rails: Left;Right Home Layout: One level        Home Equipment: Production designer, theatre/television/film (4 wheels)   Additional Comments: Pt reported she does not desire to return to her prior living situation. She hopes to be able to move into her sister's home.      Prior Functioning/Environment Prior Level of Function : Needs assist             Mobility Comments: She used a rollator for ambulation. Denies falls in past 6 months ADLs Comments: She reported she has not driven in ~1 year. Her family assisted with meal prep, she performed dressing & toileting  without the need for assistance, however she required assistance for bathing though they have been having "water issues", so she has been sponge bathing recently. She used 2L O2 at night.        OT Problem List: Impaired balance (sitting and/or standing);Pain;Decreased strength      OT Treatment/Interventions: Self-care/ADL training;Therapeutic exercise;Therapeutic activities;Energy conservation;Patient/family education;DME and/or AE instruction;Balance training    OT Goals(Current goals can be found in the care plan section) Acute Rehab OT Goals Patient Stated Goal: To move in with her sister OT Goal Formulation: With patient Time For Goal Achievement: 12/21/22 Potential to Achieve Goals: Good ADL Goals Pt Will Perform Grooming: with modified independence;standing Pt Will Perform Lower Body Dressing: with modified independence;sit to/from stand Pt Will Transfer to Toilet: with modified independence;ambulating Pt Will Perform Toileting - Clothing Manipulation and hygiene: with modified independence;sit to/from stand  OT Frequency: Min 2X/week       AM-PAC OT "6 Clicks" Daily Activity      Outcome Measure Help from another person eating meals?: None Help from another person taking care of personal grooming?: None Help from another person toileting, which includes using toliet, bedpan, or urinal?: A Little Help from another person bathing (including washing, rinsing, drying)?: A Little Help from another person to put on and taking off regular upper body clothing?: None Help from another person to put on and taking off regular lower body clothing?: A Little 6 Click Score: 21   End of Session Equipment Utilized During Treatment: Rolling walker (2 wheels) Nurse Communication: Mobility status  Activity Tolerance: Patient tolerated treatment well Patient left: in bed;with call bell/phone within reach  OT Visit Diagnosis: Pain;Unsteadiness on feet (R26.81)                Time: 1610-9604 OT Time Calculation (min): 27 min Charges:  OT General Charges $OT Visit: 1 Visit OT Evaluation $OT Eval Low Complexity: 1 Low    Lorenza Winkleman L Asherah Lavoy, OTR/L 12/07/2022, 12:23 PM

## 2022-12-08 ENCOUNTER — Encounter: Payer: Medicare HMO | Admitting: Physician Assistant

## 2022-12-08 DIAGNOSIS — A046 Enteritis due to Yersinia enterocolitica: Secondary | ICD-10-CM | POA: Diagnosis not present

## 2022-12-08 DIAGNOSIS — R197 Diarrhea, unspecified: Secondary | ICD-10-CM | POA: Diagnosis not present

## 2022-12-08 MED ORDER — CIPROFLOXACIN HCL 500 MG PO TABS
500.0000 mg | ORAL_TABLET | Freq: Two times a day (BID) | ORAL | Status: DC
  Administered 2022-12-08 – 2022-12-09 (×3): 500 mg via ORAL
  Filled 2022-12-08 (×3): qty 1

## 2022-12-08 NOTE — Progress Notes (Signed)
Mobility Specialist - Progress Note   12/08/22 0936  Mobility  Activity Ambulated with assistance in hallway  Level of Assistance Contact guard assist, steadying assist  Assistive Device Front wheel walker  Distance Ambulated (ft) 80 ft  Range of Motion/Exercises Active  Activity Response Tolerated well  Mobility Referral Yes  $Mobility charge 1 Mobility  Mobility Specialist Start Time (ACUTE ONLY) 0920  Mobility Specialist Stop Time (ACUTE ONLY) 0936  Mobility Specialist Time Calculation (min) (ACUTE ONLY) 16 min   Pt received in bed and agreed to mobility, had pain when transitioning from STS, claimed it was everywhere and was an 8/10. During session, endorsed pain in arms, feet, stomach. Pt returned to bed with all needs met.   Marilynne Halsted Mobility Specialist

## 2022-12-08 NOTE — TOC Progression Note (Addendum)
Transition of Care [facility]) - Progression Note    Patient Details  Name: [name] MRN: [id number] Date of Birth: May 27, 1956  Transition of Care [facility]) CM/SW Contact  Connie LAMOTTE, LCSW Phone Number: 12/08/2022, 3:17 PM  Clinical Narrative:    CSW spoke with pt and pt's sister [name]. Ms Jena Gauss stated pt will be coming to her home at [address], [place], [place]. Pt's sister stated she is unable to pick pt up due to not having a car. CSW discussed HH services with both pt and the pt's sister they both agreed it would benefit the pt. Pt did not have a preference in an agency. [facility] accepted pt for HHPT/OT/SW and aide. Pt has no DME needs. Pt will need transportation assistance at  d/c. TOC to follow   Expected Discharge Plan: Home w Home Health Services Barriers to Discharge: Continued Medical Work up  Expected Discharge Plan and Services       Living arrangements for the past 2 months: Single Family Home                                       Social Determinants of Health (SDOH) Interventions SDOH Screenings   Food Insecurity: No Food Insecurity (12/07/2022)  Housing: Low Risk  (12/07/2022)  Transportation Needs: No Transportation Needs (12/07/2022)  Utilities: Not At Risk (12/07/2022)  Financial Resource Strain: Medium Risk (04/04/2018)  Physical Activity: Insufficiently Active (04/04/2018)  Stress: Stress Concern Present (04/04/2018)  Tobacco Use: Medium Risk (12/07/2022)    Readmission Risk Interventions    09/08/2021   11:20 AM  Readmission Risk Prevention Plan  Transportation Screening Complete  Medication Review (RN Care Manager) Complete  PCP or Specialist appointment within 3-5 days of discharge Complete  HRI or Home Care Consult Complete  SW Recovery Care/Counseling Consult Complete  Palliative Care Screening Not Applicable  Skilled Nursing Facility Complete

## 2022-12-08 NOTE — Plan of Care (Signed)
  Problem: Clinical Measurements: Goal: Diagnostic test results will improve Outcome: Progressing   Problem: Coping: Goal: Level of anxiety will decrease Outcome: Progressing   Problem: Elimination: Goal: Will not experience complications related to bowel motility Outcome: Progressing Goal: Will not experience complications related to urinary retention Outcome: Progressing   Problem: Safety: Goal: Ability to remain free from injury will improve Outcome: Progressing   

## 2022-12-08 NOTE — Progress Notes (Signed)
PROGRESS NOTE    [name]  ZOX:[id number] DOB: Jan 10, 1956 DOA: 12/06/2022 PCP: Rodrigo Ran, MD    Brief Narrative:  67 year old with history of COPD on 2 L nocturnal oxygen, hypothyroidism, hyperlipidemia, GERD, chronic diastolic dysfunction admitted to [facility] with 5 days of intractable diarrhea, nausea and lower abdominal pain did not improve with treatment at home.  Felt very weak.  No sick contacts.  In the emergency room hemodynamically stable.  Electrolytes is stable.  WBC count normal.  Lactic acid 0.9.  CT scan abdomen pelvis with no evidence of acute intra-abdominal pathology.  Admitted due to significant diarrhea and dehydration. Patient was found to have [place] enterocolitica.   Assessment & Plan:   Nausea vomiting and diarrhea: due to [place] enterocolitica.  She has some clinical improvement but continues to have abdominal pain cramping and diarrhea.  Check C. difficile and GI pathogen panel. Discontinue IV fluid.  Challenge with oral intake.  Encourage oral intake. Mobilize. Initially decided not to treat, however with persistent symptoms will treat with ciprofloxacin 500 mg twice daily for 7 days.  Chronic medical issues including COPD and nocturnal hypoxemia on 2 L oxygen, stable.  On as needed albuterol.  On trelegy. Hyperlipidemia, on statin. Hypothyroidism, on Synthroid. History of depression, on Wellbutrin and gabapentin. GERD, PPI Chronic diastolic dysfunction, currently on IV fluids.    DVT prophylaxis: SCDs Start: 12/07/22 0015   Code Status: DNR, documented on admission Family Communication: None at bedside Disposition Plan: Status is: Observation The patient will require care spanning > 2 midnights and should be moved to inpatient because: IV fluids, monitoring, mobility     Consultants:  Infectious disease, curbside  Procedures:  None  Antimicrobials:  Ciprofloxacin 5/8---   Subjective:  Seen and examined in the morning.  Does  not feel well.  He still has lower abdominal cramping.  She had a small volume diarrhea overnight.  She is trying to keep up with some nutrition.  Only able to eat omelettes.  Objective: Vitals:   12/08/22 0423 12/08/22 0903 12/08/22 1227 12/08/22 1227  BP:   106/85 106/85  Pulse:   81 81  Resp:   18 18  Temp:   98.1 F (36.7 C) 98.1 F (36.7 C)  TempSrc:   Oral Oral  SpO2:  94% 97% 97%  Weight: 88.8 kg     Height:        Intake/Output Summary (Last 24 hours) at 12/08/2022 1411 Last data filed at 12/08/2022 0425 Gross per 24 hour  Intake 444 ml  Output --  Net 444 ml   Filed Weights   12/07/22 0154 12/08/22 0423  Weight: 88.1 kg 88.8 kg    Examination:  General: Chronically sick looking.  Not in any distress.  Frail and debilitated. Cardiovascular: S1-S2 normal.  Regular rate rhythm. Respiratory: Bilateral clear.  No added sounds. Gastrointestinal: Soft.  Bowel sound present.  Nontender.  No rigidity or guarding. Ext: No swelling or edema.  No cyanosis. Neuro: Intact. Musculoskeletal: No deformities.      Data Reviewed: I have personally reviewed following labs and imaging studies  CBC: Recent Labs  Lab 12/06/22 1717 12/07/22 0259  WBC 7.8 7.9  NEUTROABS 5.9 5.1  HGB 15.0 14.8  HCT 49.4* 49.1*  MCV 98.0 97.0  PLT 210 207   Basic Metabolic Panel: Recent Labs  Lab 12/06/22 1717 12/07/22 0259  NA 141 139  K 3.9 3.6  CL 101 101  CO2 30 30  GLUCOSE 121* 104*  BUN 11 10  CREATININE 0.81 0.95  CALCIUM 9.3 9.1  MG  --  1.9   GFR: Estimated Creatinine Clearance: 62.8 mL/min (by C-G formula based on SCr of 0.95 mg/dL). Liver Function Tests: Recent Labs  Lab 12/06/22 1717 12/07/22 0259  AST 24 16  ALT 26 26  ALKPHOS 65 66  BILITOT 1.2 0.9  PROT 7.1 6.8  ALBUMIN 3.7 3.6   Recent Labs  Lab 12/06/22 1717  LIPASE 20   No results for input(s): "AMMONIA" in the last 168 hours. Coagulation Profile: No results for input(s): "INR", "PROTIME" in the  last 168 hours. Cardiac Enzymes: No results for input(s): "CKTOTAL", "CKMB", "CKMBINDEX", "TROPONINI" in the last 168 hours. BNP (last 3 results) No results for input(s): "PROBNP" in the last 8760 hours. HbA1C: No results for input(s): "HGBA1C" in the last 72 hours. CBG: No results for input(s): "GLUCAP" in the last 168 hours. Lipid Profile: No results for input(s): "CHOL", "HDL", "LDLCALC", "TRIG", "CHOLHDL", "LDLDIRECT" in the last 72 hours. Thyroid Function Tests: Recent Labs    12/07/22 0259  TSH 0.319*   Anemia Panel: Recent Labs    12/07/22 0758  VITAMINB12 1,290*   Sepsis Labs: Recent Labs  Lab 12/06/22 1717  LATICACIDVEN 0.9    Recent Results (from the past 240 hour(s))  C Difficile Quick Screen w PCR reflex     Status: None   Collection Time: 12/07/22  2:05 AM   Specimen: STOOL  Result Value Ref Range Status   C Diff antigen NEGATIVE NEGATIVE Final   C Diff toxin NEGATIVE NEGATIVE Final   C Diff interpretation No C. difficile detected.  Final    Comment: Performed at [facility], [address], [place], [place] [address]  Gastrointestinal Panel by PCR , Stool     Status: Abnormal   Collection Time: 12/07/22  2:06 AM   Specimen: STOOL  Result Value Ref Range Status   Campylobacter species NOT DETECTED NOT DETECTED Final   Plesimonas shigelloides NOT DETECTED NOT DETECTED Final   Salmonella species NOT DETECTED NOT DETECTED Final   Yersinia enterocolitica DETECTED (A) NOT DETECTED Final    Comment: RESULT CALLED TO, READ BACK BY AND VERIFIED WITH: ZOEY RUSSELL RN 1002 12/07/22 HNM    Vibrio species NOT DETECTED NOT DETECTED Final   Vibrio cholerae NOT DETECTED NOT DETECTED Final   Enteroaggregative E coli (EAEC) NOT DETECTED NOT DETECTED Final   Enteropathogenic E coli (EPEC) NOT DETECTED NOT DETECTED Final   Enterotoxigenic E coli (ETEC) NOT DETECTED NOT DETECTED Final   Shiga like toxin producing E coli (STEC) NOT DETECTED NOT  DETECTED Final   Shigella/Enteroinvasive E coli (EIEC) NOT DETECTED NOT DETECTED Final   Cryptosporidium NOT DETECTED NOT DETECTED Final   Cyclospora cayetanensis NOT DETECTED NOT DETECTED Final   Entamoeba histolytica NOT DETECTED NOT DETECTED Final   Giardia lamblia NOT DETECTED NOT DETECTED Final   Adenovirus F40/41 NOT DETECTED NOT DETECTED Final   Astrovirus NOT DETECTED NOT DETECTED Final   Norovirus GI/GII NOT DETECTED NOT DETECTED Final   Rotavirus A NOT DETECTED NOT DETECTED Final   Sapovirus (I, II, IV, and V) NOT DETECTED NOT DETECTED Final    Comment: Performed at [facility], [address] [place], [place] [address]  MRSA Next Gen by PCR, Nasal     Status: Abnormal   Collection Time: 12/07/22  6:59 AM   Specimen: Nasal Mucosa; Nasal Swab  Result Value Ref Range Status   MRSA  by PCR Next Gen DETECTED (A) NOT DETECTED Final    Comment: (NOTE) The GeneXpert MRSA Assay (FDA approved for NASAL specimens only), is one component of a comprehensive MRSA colonization surveillance program. It is not intended to diagnose MRSA infection nor to guide or monitor treatment for MRSA infections. Test performance is not FDA approved in patients less than 46 years old. Performed at [facility], [address], [place], [place] [address]          Radiology Studies: CT ABDOMEN PELVIS W CONTRAST  Result Date: 12/06/2022 CLINICAL DATA:  Abdominal pain, acute, nonlocalized, urinary frequency, melena EXAM: CT ABDOMEN AND PELVIS WITH CONTRAST TECHNIQUE: Multidetector CT imaging of the abdomen and pelvis was performed using the standard protocol following bolus administration of intravenous contrast. RADIATION DOSE REDUCTION: This exam was performed according to the departmental dose-optimization program which includes automated exposure control, adjustment of the mA and/or kV according to patient size and/or use of iterative reconstruction technique. CONTRAST:   OMNIPAQUE IOHEXOL 300 MG/ML  SOLN COMPARISON:  06/16/2021 FINDINGS: Lower chest: No acute abnormality. Hepatobiliary: No focal liver abnormality is seen. Status post cholecystectomy. No biliary dilatation. Pancreas: Unremarkable Spleen: Unremarkable Adrenals/Urinary Tract: Adrenal glands are unremarkable. Kidneys are normal, without renal calculi, focal lesion, or hydronephrosis. Bladder is unremarkable. Stomach/Bowel: Stomach is within normal limits. Appendix appears normal. No evidence of bowel wall thickening, distention, or inflammatory changes. Vascular/Lymphatic: Aortic atherosclerosis. No enlarged abdominal or pelvic lymph nodes. Reproductive: Status post hysterectomy. No adnexal masses. Other: No abdominal wall hernia or abnormality. No abdominopelvic ascites. Musculoskeletal: Degenerative changes are seen within the lumbar spine. Resultant multilevel neuroforaminal narrowing and high-grade central canal stenosis at L3-4, L4-5, and L5-S1, not well evaluated on this examination. No acute bone abnormality. IMPRESSION: 1. No acute intra-abdominal pathology identified. No definite radiographic explanation for the patient's reported symptoms. 2. 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 If indicated, this could be better assessed with MRI examination. Aortic Atherosclerosis (ICD10-I70.0). Electronically Signed   By: Helyn Numbers M.D.   On: 12/06/2022 22:34        Scheduled Meds:  ARIPiprazole  10 mg Oral Daily   atorvastatin  10 mg Oral Daily   ciprofloxacin  500 mg Oral BID   fluticasone furoate-vilanterol  1 puff Inhalation Daily   And   umeclidinium bromide  1 puff Inhalation Daily   gabapentin  300 mg Oral TID   levothyroxine  150 mcg Oral Q0600   venlafaxine XR  150 mg Oral QHS   Continuous Infusions:     LOS: 0 days    Time spent: 35  minutes    Dorcas Carrow, MD [facility] Pager [phone]

## 2022-12-09 DIAGNOSIS — A046 Enteritis due to Yersinia enterocolitica: Secondary | ICD-10-CM | POA: Diagnosis not present

## 2022-12-09 DIAGNOSIS — R197 Diarrhea, unspecified: Secondary | ICD-10-CM | POA: Diagnosis not present

## 2022-12-09 LAB — CBC WITH DIFFERENTIAL/PLATELET
Abs Immature Granulocytes: 0.03 10*3/uL (ref 0.00–0.07)
Basophils Absolute: 0.1 10*3/uL (ref 0.0–0.1)
Basophils Relative: 1 %
Eosinophils Absolute: 0.1 10*3/uL (ref 0.0–0.5)
Eosinophils Relative: 1 %
HCT: 49.7 % — ABNORMAL HIGH (ref 36.0–46.0)
Hemoglobin: 14.8 g/dL (ref 12.0–15.0)
Immature Granulocytes: 1 %
Lymphocytes Relative: 25 %
Lymphs Abs: 1.6 10*3/uL (ref 0.7–4.0)
MCH: 30 pg (ref 26.0–34.0)
MCHC: 29.8 g/dL — ABNORMAL LOW (ref 30.0–36.0)
MCV: 100.8 fL — ABNORMAL HIGH (ref 80.0–100.0)
Monocytes Absolute: 0.3 10*3/uL (ref 0.1–1.0)
Monocytes Relative: 4 %
Neutro Abs: 4.5 10*3/uL (ref 1.7–7.7)
Neutrophils Relative %: 68 %
Platelets: 158 10*3/uL (ref 150–400)
RBC: 4.93 MIL/uL (ref 3.87–5.11)
RDW: 22.5 % — ABNORMAL HIGH (ref 11.5–15.5)
WBC: 6.6 10*3/uL (ref 4.0–10.5)
nRBC: 0 % (ref 0.0–0.2)

## 2022-12-09 LAB — COMPREHENSIVE METABOLIC PANEL
ALT: 21 U/L (ref 0–44)
AST: 13 U/L — ABNORMAL LOW (ref 15–41)
Albumin: 3.3 g/dL — ABNORMAL LOW (ref 3.5–5.0)
Alkaline Phosphatase: 59 U/L (ref 38–126)
Anion gap: 10 (ref 5–15)
BUN: 9 mg/dL (ref 8–23)
CO2: 27 mmol/L (ref 22–32)
Calcium: 9 mg/dL (ref 8.9–10.3)
Chloride: 100 mmol/L (ref 98–111)
Creatinine, Ser: 0.8 mg/dL (ref 0.44–1.00)
GFR, Estimated: >60 mL/min (ref >60)
Glucose, Bld: 108 mg/dL — ABNORMAL HIGH (ref 70–99)
Potassium: 3.6 mmol/L (ref 3.5–5.1)
Sodium: 137 mmol/L (ref 135–145)
Total Bilirubin: 0.4 mg/dL (ref 0.3–1.2)
Total Protein: 6.2 g/dL — ABNORMAL LOW (ref 6.5–8.1)

## 2022-12-09 LAB — MAGNESIUM: Magnesium: 1.7 mg/dL (ref 1.7–2.4)

## 2022-12-09 MED ORDER — CIPROFLOXACIN HCL 500 MG PO TABS: 500.0000 mg | ORAL_TABLET | Freq: Two times a day (BID) | ORAL | 0 refills | Status: AC

## 2022-12-09 NOTE — Progress Notes (Signed)
Physical Therapy Treatment Patient Details Name: [name] MRN: [id number] DOB: 12/28/1955 Today's Date: 12/09/2022   History of Present Illness 67 y.o. female with medical history significant for COPD on 2 L nocturnal nasal cannula, hyperlipidemia, acquired hypothyroidism, GERD, chronic diastolic heart failure, who is admitted to [facility] on 12/06/2022 with diarrhea after presenting from home to [facility] ED complaining of diarrhea.    PT Comments    Pt comes to EOB modified ind, increased time without cues or assist. Pt performs STS from EOB and step pivot to BSC with RW, supv. Pt amb 30 ft in room, maintains flexed trunk, slow steady steps without knee buckling or overt LOB, educated on increased thoracic extension and pursed lip breathing. Pt on RA with SpO2 noted to desat to 86%, cued for pursed lip breathing and able to recover to 90%; returned 2L and SpO2 93%- RN notified. Pt able to perform seated LAQ in recliner, but becomes tearful and voicing frustrations regarding weakness and current functional status, offered listening ear and encouragement. Pt requesting to speak with chaplain, notified RN.    Recommendations for follow up therapy are one component of a multi-disciplinary discharge planning process, led by the attending physician.  Recommendations may be updated based on patient status, additional functional criteria and insurance authorization.  Follow Up Recommendations       Assistance Recommended at Discharge Set up Supervision/Assistance  Patient can return home with the following A little help with bathing/dressing/bathroom;Assist for transportation;Help with stairs or ramp for entrance;Assistance with cooking/housework   Equipment Recommendations  None recommended by PT    Recommendations for Other Services       Precautions / Restrictions Precautions Precautions: Fall Restrictions Weight Bearing Restrictions: No     Mobility  Bed Mobility Overal bed  mobility: Modified Independent  General bed mobility comments: increased time to come to sitting EOB    Transfers Overall transfer level: Needs assistance Equipment used: Rolling walker (2 wheels) Transfers: Sit to/from Stand, Bed to chair/wheelchair/BSC Sit to Stand: Supervision  Step pivot transfers: Supervision  General transfer comment: BUE assisting to power up to stand wtih RW, supv for STS and to pivot to [facility], pt maintains flexed trunk    Ambulation/Gait Ambulation/Gait assistance: Supervision Gait Distance (Feet): 30 Feet Assistive device: Rolling walker (2 wheels) Gait Pattern/deviations: Step-through pattern, Decreased stride length, Trunk flexed Gait velocity: decreased  General Gait Details: slow, steady gait with RW, step through gait pattern with trunk flexed over RW, no knee buckling or overt LOB noted   Stairs             Wheelchair Mobility    Modified Rankin (Stroke Patients Only)       Balance Overall balance assessment: Needs assistance  Sitting balance-Leahy Scale: Good  Standing balance support: Bilateral upper extremity supported, During functional activity, Reliant on assistive device for balance Standing balance-Leahy Scale: Fair Standing balance comment: able to release BUE static standing, using RW for amb     Cognition Arousal/Alertness: Awake/alert Behavior During Therapy: WFL for tasks assessed/performed Overall Cognitive Status: Within Functional Limits for tasks assessed  General Comments: pt oriented to self and being in hospital, pt voices frustrations with current functional status and weakness, tearful at times, follows commands appropriately        Exercises General Exercises - Lower Extremity Long Arc Quad: Seated, AROM, Strengthening, Both, 20 reps    General Comments General comments (skin integrity, edema, etc.): Pt on 2L upon arrival with SpO2 93%,  pt reports on RA at baseline so removed 2L and pt desat to 86% with  mobility, cued for pursed lip breathing and able to recover to 90%, returned 2L and pt SpO2 93%- notified RN      Pertinent Vitals/Pain Pain Assessment Pain Assessment: 0-10 Pain Score: 8  Pain Location: headache Pain Intervention(s): Limited activity within patient's tolerance, Monitored during session, Patient requesting pain meds-RN notified, Repositioned    Home Living                          Prior Function            PT Goals (current goals can now be found in the care plan section) Acute Rehab PT Goals Patient Stated Goal: to get stronger PT Goal Formulation: With patient Time For Goal Achievement: 12/21/22 Potential to Achieve Goals: Good Progress towards PT goals: Progressing toward goals    Frequency    Min 1X/week      PT Plan Current plan remains appropriate    Co-evaluation              AM-PAC PT "6 Clicks" Mobility   Outcome Measure  Help needed turning from your back to your side while in a flat bed without using bedrails?: A Little Help needed moving from lying on your back to sitting on the side of a flat bed without using bedrails?: A Little Help needed moving to and from a bed to a chair (including a wheelchair)?: A Little Help needed standing up from a chair using your arms (e.g., wheelchair or bedside chair)?: A Little Help needed to walk in hospital room?: A Little Help needed climbing 3-5 steps with a railing? : A Lot 6 Click Score: 17    End of Session Equipment Utilized During Treatment: Gait belt Activity Tolerance: Patient tolerated treatment well Patient left: in chair;with call bell/phone within reach;with chair alarm set Nurse Communication: Mobility status;Other (comment) (SPO2, headache pain, chaplain) PT Visit Diagnosis: Pain;Difficulty in walking, not elsewhere classified (R26.2)     Time: 1610-9604 PT Time Calculation (min) (ACUTE ONLY): 28 min  Charges:  $Gait Training: 8-22 mins $Therapeutic Activity:  8-22 mins                      Tori Breckyn Ticas PT, DPT 12/09/22, 10:33 AM

## 2022-12-09 NOTE — TOC Transition Note (Signed)
Transition of Care [facility]) - CM/SW Discharge Note   Patient Details  Name: [name] MRN: [id number] Date of Birth: July 02, 1956  Transition of [facility]) CM/SW Contact:  Larrie Kass, LCSW Phone Number: 12/09/2022, 12:13 PM   Clinical Narrative:      CSW spoke with pt abut rec for Rollator , pt stated she was given a Rollator a long time ago. She reports she did not receive a walker from her insurance company. CSW spoke with jason with [employer] he reports pt received walker in 2022 and does not qualify for another walker through her Medicare. CSW spoke with pt and informed her she does not qualify for a new walker at this time. Pt will have to pay OOP. Pt reported she cannot pay OOP for a new rollator. CSW provided pt with charity two wheel walker. CSW will arnage safe transport for ride home at d/c. No addional needs TOC sign off.     Barriers to Discharge: Continued Medical Work up   Patient Goals and CMS Choice      Discharge Placement                         Discharge Plan and Services Additional resources added to the After Visit Summary for                                       Social Determinants of Health (SDOH) Interventions SDOH Screenings   Food Insecurity: No Food Insecurity (12/07/2022)  Housing: Low Risk  (12/07/2022)  Transportation Needs: No Transportation Needs (12/07/2022)  Utilities: Not At Risk (12/07/2022)  Financial Resource Strain: Medium Risk (04/04/2018)  Physical Activity: Insufficiently Active (04/04/2018)  Stress: Stress Concern Present (04/04/2018)  Tobacco Use: Medium Risk (12/07/2022)     Readmission Risk Interventions    09/08/2021   11:20 AM  Readmission Risk Prevention Plan  Transportation Screening Complete  Medication Review (RN Care Manager) Complete  PCP or Specialist appointment within 3-5 days of discharge Complete  HRI or Home Care Consult Complete  SW Recovery Care/Counseling Consult Complete  Palliative  Care Screening Not Applicable  Skilled Nursing Facility Complete

## 2022-12-09 NOTE — Discharge Summary (Signed)
Physician Discharge Summary  [name] WRU:[id number] DOB: 05-14-1956 DOA: 12/06/2022  PCP: Rodrigo Ran, MD  Admit date: 12/06/2022 Discharge date: 12/09/2022  Admitted From: Home Disposition: Home with home health  Recommendations for Outpatient Follow-up:  Follow up with PCP in 1-2 weeks Please obtain BMP/CBC/magnesium/phosphorus in one week   Home Health: PT/OT/social worker/home health aide Equipment/Devices: Oxygen, already available at home  Discharge Condition: Stable CODE STATUS: DNR Diet recommendation: Low-salt diet  Discharge summary: 67 year old with history of COPD on 2 L nocturnal oxygen, hypothyroidism, hyperlipidemia, GERD, chronic diastolic dysfunction admitted to [facility] with 5 days of intractable diarrhea, nausea and lower abdominal pain did not improve with treatment at home.  Felt very weak.  No sick contacts.  In the emergency room hemodynamically stable.  Electrolytes is stable.  WBC count normal.  Lactic acid 0.9.  CT scan abdomen pelvis with no evidence of acute intra-abdominal pathology.  Admitted due to significant diarrhea and dehydration. Patient was found to have [place] enterocolitica.     Assessment & plan of care:   Nausea vomiting and diarrhea: due to [place] enterocolitica. Patient had ongoing abdominal discomfort cramping and diarrhea.  C. difficile and GI pathogen panel were negative.  Stool pathology consistent with Yersinia enterocolitis.  Due to significant symptoms, she was treated with ciprofloxacin 5 mg twice daily for 7 days.  She will continue and complete treatment.  Adequately hydrated.  Able to keep up oral hydration.   Chronic medical issues including COPD and nocturnal hypoxemia on 2 L oxygen, stable.  On as needed albuterol.  On trelegy. Hyperlipidemia, on statin. Hypothyroidism, on Synthroid. History of depression, on Wellbutrin and gabapentin. GERD, PPI Chronic diastolic dysfunction, euvolemic.  Patient medically  stabilized.  Able to keep up with oral hydration.  She does have chronic pain issues and uses oxycodone at home. Patient was hesitant to go home from the hospital because of her housing condition.  However, she is living with her sister and her sister was welcoming her back home.  Will optimize home health therapies, home health aide.  Social worker visit to evaluate Home condition. Currently stable.  Able to go home with her sister.     Discharge Diagnoses:  Principal Problem:   Diarrhea Active Problems:   GERD (gastroesophageal reflux disease)   Depression   Acquired hypothyroidism   Hyperlipidemia   COPD (chronic obstructive pulmonary disease) (HCC)   Chronic diastolic CHF (congestive heart failure) (HCC)   Generalized weakness    Discharge Instructions  Discharge Instructions     Diet - low sodium heart healthy   Complete by: As directed    Increase activity slowly   Complete by: As directed    No wound care   Complete by: As directed       Allergies as of 12/09/2022       Reactions   Gemfibrozil Other (See Comments)   Sick to stomach   Nitroglycerin Other (See Comments)    Cough, Irritation   Other Other (See Comments)   Grape fruit juice - unknown   Pravastatin Other (See Comments)   Dizzy   Sulfamethoxazole-trimethoprim Nausea And Vomiting   Tyloxapol Other (See Comments)    Does not work        Medication List     STOP taking these medications    liver oil-zinc oxide 40 % ointment Commonly known as: DESITIN   pantoprazole 40 MG tablet Commonly known as: PROTONIX       TAKE these medications  ALPRAZolam 1 MG tablet Commonly known as: XANAX Take 1 mg by mouth 3 (three) times daily as needed for anxiety.   ARIPiprazole 10 MG tablet Commonly known as: ABILIFY Take 10 mg by mouth daily.   atorvastatin 10 MG tablet Commonly known as: LIPITOR Take 10 mg by mouth daily.   Breztri Aerosphere 160-9-4.8 MCG/ACT Aero Generic drug:  Budeson-Glycopyrrol-Formoterol Inhale 2 puffs into the lungs in the morning and at bedtime.   buPROPion 150 MG 24 hr tablet Commonly known as: WELLBUTRIN XL Take 150 mg by mouth every evening.   cetirizine 10 MG tablet Commonly known as: ZYRTEC Take 10 mg by mouth at bedtime.   ciprofloxacin 500 MG tablet Commonly known as: CIPRO Take 1 tablet (500 mg total) by mouth 2 (two) times daily for 6 days.   esomeprazole 40 MG capsule Commonly known as: NEXIUM Take 40 mg by mouth daily.   gabapentin 300 MG capsule Commonly known as: NEURONTIN Take 300 mg by mouth 3 (three) times daily.   hydrocortisone cream 0.5 % Apply topically 3 (three) times daily as needed for itching.   ipratropium-albuterol 0.5-2.5 (3) MG/3ML Soln Commonly known as: DUONEB use 1 vial by nebulization every 4 (four) hours as needed. What changed: reasons to take this   levothyroxine 150 MCG tablet Commonly known as: SYNTHROID Take 150 mcg by mouth daily before breakfast.   oxyCODONE-acetaminophen 5-325 MG tablet Commonly known as: PERCOCET/ROXICET Take 1-2 tablets by mouth every 6 (six) hours as needed for moderate pain or severe pain.   venlafaxine XR 150 MG 24 hr capsule Commonly known as: EFFEXOR-XR Take 150 mg by mouth at bedtime.        Follow-up Information     [facility] Follow up.   Specialty: Home Health Services Why: will follow up 24 to 48hrs after your dischage. Contact information: [address] [place] [address] [phone]                Allergies  Allergen Reactions   Gemfibrozil Other (See Comments)    Sick to stomach   Nitroglycerin Other (See Comments)     Cough, Irritation   Other Other (See Comments)    Grape fruit juice - unknown   Pravastatin Other (See Comments)    Dizzy   Sulfamethoxazole-Trimethoprim Nausea And Vomiting   Tyloxapol Other (See Comments)     Does not work     Consultations: None   Procedures/Studies: CT ABDOMEN PELVIS W CONTRAST  Result Date: 12/06/2022 CLINICAL DATA:  Abdominal pain, acute, nonlocalized, urinary frequency, melena EXAM: CT ABDOMEN AND PELVIS WITH CONTRAST TECHNIQUE: Multidetector CT imaging of the abdomen and pelvis was performed using the standard protocol following bolus administration of intravenous contrast. RADIATION DOSE REDUCTION: This exam was performed according to the departmental dose-optimization program which includes automated exposure control, adjustment of the mA and/or kV according to patient size and/or use of iterative reconstruction technique. CONTRAST:  OMNIPAQUE IOHEXOL 300 MG/ML  SOLN COMPARISON:  06/16/2021 FINDINGS: Lower chest: No acute abnormality. Hepatobiliary: No focal liver abnormality is seen. Status post cholecystectomy. No biliary dilatation. Pancreas: Unremarkable Spleen: Unremarkable Adrenals/Urinary Tract: Adrenal glands are unremarkable. Kidneys are normal, without renal calculi, focal lesion, or hydronephrosis. Bladder is unremarkable. Stomach/Bowel: Stomach is within normal limits. Appendix appears normal. No evidence of bowel wall thickening, distention, or inflammatory changes. Vascular/Lymphatic: Aortic atherosclerosis. No enlarged abdominal or pelvic lymph nodes. Reproductive: Status post hysterectomy. No adnexal masses. Other: No abdominal wall hernia or  abnormality. No abdominopelvic ascites. Musculoskeletal: Degenerative changes are seen within the lumbar spine. Resultant multilevel neuroforaminal narrowing and high-grade central canal stenosis at L3-4, L4-5, and L5-S1, not well evaluated on this examination. No acute bone abnormality. IMPRESSION: 1. No acute intra-abdominal pathology identified. No definite radiographic explanation for the patient's reported symptoms. 2. 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 If indicated, this could be better assessed with MRI examination. Aortic Atherosclerosis (ICD10-I70.0). Electronically Signed   By: Helyn Numbers M.D.   On: 12/06/2022 22:34   (Echo, Carotid, EGD, Colonoscopy, ERCP)    Subjective: Patient seen and examined.  Eating breakfast.  Denies any nausea.  Still has some abdominal discomfort.  1 loose stool in the last 24 hours.  Hesitant and she tells me she has no place to go and she needs to go to nursing home.  Case management coordinated with her sister and she is going back to her sister's house.   Discharge Exam: Vitals:   12/09/22 0655 12/09/22 0838  BP: 115/79   Pulse: 99   Resp: 16   Temp: 99 F (37.2 C)   SpO2: 94% 95%   Vitals:   12/08/22 1227 12/08/22 2222 12/09/22 0655 12/09/22 0838  BP: 106/85 (!) 154/72 115/79   Pulse: 81 84 99   Resp: 18 18 16    Temp: 98.1 F (36.7 C) 98.8 F (37.1 C) 99 F (37.2 C)   TempSrc: Oral Oral Oral   SpO2: 97% 95% 94% 95%  Weight:      Height:        General: Pt is alert, awake, not in acute distress Frail.  Chronically sick looking.  Not in any distress. Cardiovascular: RRR, S1/S2 +, no rubs, no gallops Respiratory: CTA bilaterally, no wheezing, no rhonchi Abdominal: Soft, NT, ND, bowel sounds + Extremities: no edema, no cyanosis    The results of significant diagnostics from this hospitalization (including imaging, microbiology, ancillary and laboratory) are listed below for reference.     Microbiology: Recent Results (from the past 240 hour(s))  C Difficile Quick Screen w PCR reflex     Status: None   Collection Time: 12/07/22  2:05 AM   Specimen: STOOL  Result Value Ref Range Status   C Diff antigen NEGATIVE NEGATIVE Final   C Diff toxin NEGATIVE NEGATIVE Final   C Diff interpretation No C. difficile detected.  Final    Comment: Performed at [facility], [address], [place], [place] [address]   Gastrointestinal Panel by PCR , Stool     Status: Abnormal   Collection Time: 12/07/22  2:06 AM   Specimen: STOOL  Result Value Ref Range Status   Campylobacter species NOT DETECTED NOT DETECTED Final   Plesimonas shigelloides NOT DETECTED NOT DETECTED Final   Salmonella species NOT DETECTED NOT DETECTED Final   Yersinia enterocolitica DETECTED (A) NOT DETECTED Final    Comment: RESULT CALLED TO, READ BACK BY AND VERIFIED WITH: ZOEY RUSSELL RN 1002 12/07/22 HNM    Vibrio species NOT DETECTED NOT DETECTED Final   Vibrio cholerae NOT DETECTED NOT DETECTED Final   Enteroaggregative E coli (EAEC) NOT DETECTED NOT DETECTED Final   Enteropathogenic E coli (EPEC) NOT DETECTED NOT DETECTED Final   Enterotoxigenic E coli (ETEC) NOT DETECTED NOT DETECTED Final   Shiga like toxin producing E coli (STEC) NOT DETECTED NOT DETECTED Final   Shigella/Enteroinvasive E  coli (EIEC) NOT DETECTED NOT DETECTED Final   Cryptosporidium NOT DETECTED NOT DETECTED Final   Cyclospora cayetanensis NOT DETECTED NOT DETECTED Final   Entamoeba histolytica NOT DETECTED NOT DETECTED Final   Giardia lamblia NOT DETECTED NOT DETECTED Final   Adenovirus F40/41 NOT DETECTED NOT DETECTED Final   Astrovirus NOT DETECTED NOT DETECTED Final   Norovirus GI/GII NOT DETECTED NOT DETECTED Final   Rotavirus A NOT DETECTED NOT DETECTED Final   Sapovirus (I, II, IV, and V) NOT DETECTED NOT DETECTED Final    Comment: Performed at [facility], [address] [place], [place] [address]  MRSA Next Gen by PCR, Nasal     Status: Abnormal   Collection Time: 12/07/22  6:59 AM   Specimen: Nasal Mucosa; Nasal Swab  Result Value Ref Range Status   MRSA by PCR Next Gen DETECTED (A) NOT DETECTED Final    Comment: (NOTE) The GeneXpert MRSA Assay (FDA approved for NASAL specimens only), is one component of a comprehensive MRSA colonization surveillance program. It is not intended to diagnose MRSA infection nor to guide or monitor  treatment for MRSA infections. Test performance is not FDA approved in patients less than 41 years old. Performed at [facility], [address], [place], [place] [address]      Labs: BNP (last 3 results) No results for input(s): "BNP" in the last 8760 hours. Basic Metabolic Panel: Recent Labs  Lab 12/06/22 1717 12/07/22 0259 12/09/22 0444  NA 141 139 137  K 3.9 3.6 3.6  CL 101 101 100  CO2 30 30 27   GLUCOSE 121* 104* 108*  BUN 11 10 9   CREATININE 0.81 0.95 0.80  CALCIUM 9.3 9.1 9.0  MG  --  1.9 1.7   Liver Function Tests: Recent Labs  Lab 12/06/22 1717 12/07/22 0259 12/09/22 0444  AST 24 16 13*  ALT 26 26 21   ALKPHOS 65 66 59  BILITOT 1.2 0.9 0.4  PROT 7.1 6.8 6.2*  ALBUMIN 3.7 3.6 3.3*   Recent Labs  Lab 12/06/22 1717  LIPASE 20   No results for input(s): "AMMONIA" in the last 168 hours. CBC: Recent Labs  Lab 12/06/22 1717 12/07/22 0259 12/09/22 0444  WBC 7.8 7.9 6.6  NEUTROABS 5.9 5.1 4.5  HGB 15.0 14.8 14.8  HCT 49.4* 49.1* 49.7*  MCV 98.0 97.0 100.8*  PLT 210 207 158   Cardiac Enzymes: No results for input(s): "CKTOTAL", "CKMB", "CKMBINDEX", "TROPONINI" in the last 168 hours. BNP: Invalid input(s): "POCBNP" CBG: No results for input(s): "GLUCAP" in the last 168 hours. D-Dimer No results for input(s): "DDIMER" in the last 72 hours. Hgb A1c No results for input(s): "HGBA1C" in the last 72 hours. Lipid Profile No results for input(s): "CHOL", "HDL", "LDLCALC", "TRIG", "CHOLHDL", "LDLDIRECT" in the last 72 hours. Thyroid function studies Recent Labs    12/07/22 0259  TSH 0.319*   Anemia work up Recent Labs    12/07/22 0758  VITAMINB12 1,290*   Urinalysis    Component Value Date/Time   COLORURINE YELLOW 12/06/2022 1717   APPEARANCEUR HAZY (A) 12/06/2022 1717   LABSPEC 1.012 12/06/2022 1717   PHURINE 8.0 12/06/2022 1717   GLUCOSEU NEGATIVE 12/06/2022 1717   HGBUR SMALL (A) 12/06/2022 1717   BILIRUBINUR  NEGATIVE 12/06/2022 1717   KETONESUR NEGATIVE 12/06/2022 1717   PROTEINUR NEGATIVE 12/06/2022 1717   UROBILINOGEN 0.2 04/14/2011 1651   NITRITE POSITIVE (A) 12/06/2022 1717   LEUKOCYTESUR NEGATIVE 12/06/2022 1717   Sepsis Labs Recent  Labs  Lab 12/06/22 1717 12/07/22 0259 12/09/22 0444  WBC 7.8 7.9 6.6   Microbiology Recent Results (from the past 240 hour(s))  C Difficile Quick Screen w PCR reflex     Status: None   Collection Time: 12/07/22  2:05 AM   Specimen: STOOL  Result Value Ref Range Status   C Diff antigen NEGATIVE NEGATIVE Final   C Diff toxin NEGATIVE NEGATIVE Final   C Diff interpretation No C. difficile detected.  Final    Comment: Performed at [facility], [address], [place], [place] [address]  Gastrointestinal Panel by PCR , Stool     Status: Abnormal   Collection Time: 12/07/22  2:06 AM   Specimen: STOOL  Result Value Ref Range Status   Campylobacter species NOT DETECTED NOT DETECTED Final   Plesimonas shigelloides NOT DETECTED NOT DETECTED Final   Salmonella species NOT DETECTED NOT DETECTED Final   Yersinia enterocolitica DETECTED (A) NOT DETECTED Final    Comment: RESULT CALLED TO, READ BACK BY AND VERIFIED WITH: ZOEY RUSSELL RN 1002 12/07/22 HNM    Vibrio species NOT DETECTED NOT DETECTED Final   Vibrio cholerae NOT DETECTED NOT DETECTED Final   Enteroaggregative E coli (EAEC) NOT DETECTED NOT DETECTED Final   Enteropathogenic E coli (EPEC) NOT DETECTED NOT DETECTED Final   Enterotoxigenic E coli (ETEC) NOT DETECTED NOT DETECTED Final   Shiga like toxin producing E coli (STEC) NOT DETECTED NOT DETECTED Final   Shigella/Enteroinvasive E coli (EIEC) NOT DETECTED NOT DETECTED Final   Cryptosporidium NOT DETECTED NOT DETECTED Final   Cyclospora cayetanensis NOT DETECTED NOT DETECTED Final   Entamoeba histolytica NOT DETECTED NOT DETECTED Final   Giardia lamblia NOT DETECTED NOT DETECTED Final   Adenovirus F40/41 NOT DETECTED NOT  DETECTED Final   Astrovirus NOT DETECTED NOT DETECTED Final   Norovirus GI/GII NOT DETECTED NOT DETECTED Final   Rotavirus A NOT DETECTED NOT DETECTED Final   Sapovirus (I, II, IV, and V) NOT DETECTED NOT DETECTED Final    Comment: Performed at [facility], [address] [place], [place] [address]  MRSA Next Gen by PCR, Nasal     Status: Abnormal   Collection Time: 12/07/22  6:59 AM   Specimen: Nasal Mucosa; Nasal Swab  Result Value Ref Range Status   MRSA by PCR Next Gen DETECTED (A) NOT DETECTED Final    Comment: (NOTE) The GeneXpert MRSA Assay (FDA approved for NASAL specimens only), is one component of a comprehensive MRSA colonization surveillance program. It is not intended to diagnose MRSA infection nor to guide or monitor treatment for MRSA infections. Test performance is not FDA approved in patients less than 64 years old. Performed at [facility], [address], [place], [place] [address]      Time coordinating discharge: 32 minutes  SIGNED:   Dorcas Carrow, MD  [facility] 12/09/2022, 10:18 AM

## 2022-12-10 DIAGNOSIS — K219 Gastro-esophageal reflux disease without esophagitis: Secondary | ICD-10-CM | POA: Diagnosis not present

## 2022-12-10 DIAGNOSIS — E871 Hypo-osmolality and hyponatremia: Secondary | ICD-10-CM | POA: Diagnosis not present

## 2022-12-10 DIAGNOSIS — M6250 Muscle wasting and atrophy, not elsewhere classified, unspecified site: Secondary | ICD-10-CM | POA: Diagnosis not present

## 2022-12-10 DIAGNOSIS — R069 Unspecified abnormalities of breathing: Secondary | ICD-10-CM | POA: Diagnosis not present

## 2022-12-10 DIAGNOSIS — R531 Weakness: Secondary | ICD-10-CM | POA: Diagnosis not present

## 2022-12-10 DIAGNOSIS — R262 Difficulty in walking, not elsewhere classified: Secondary | ICD-10-CM | POA: Diagnosis not present

## 2022-12-10 DIAGNOSIS — R739 Hyperglycemia, unspecified: Secondary | ICD-10-CM | POA: Diagnosis not present

## 2022-12-10 DIAGNOSIS — R0602 Shortness of breath: Secondary | ICD-10-CM | POA: Diagnosis not present

## 2022-12-10 DIAGNOSIS — F419 Anxiety disorder, unspecified: Secondary | ICD-10-CM | POA: Diagnosis not present

## 2022-12-10 DIAGNOSIS — E878 Other disorders of electrolyte and fluid balance, not elsewhere classified: Secondary | ICD-10-CM | POA: Diagnosis not present

## 2022-12-10 DIAGNOSIS — E039 Hypothyroidism, unspecified: Secondary | ICD-10-CM | POA: Diagnosis not present

## 2022-12-10 DIAGNOSIS — J9611 Chronic respiratory failure with hypoxia: Secondary | ICD-10-CM | POA: Diagnosis not present

## 2022-12-10 DIAGNOSIS — Z743 Need for continuous supervision: Secondary | ICD-10-CM | POA: Diagnosis not present

## 2022-12-10 DIAGNOSIS — G894 Chronic pain syndrome: Secondary | ICD-10-CM | POA: Diagnosis not present

## 2022-12-10 DIAGNOSIS — I5032 Chronic diastolic (congestive) heart failure: Secondary | ICD-10-CM | POA: Diagnosis not present

## 2022-12-10 DIAGNOSIS — J9610 Chronic respiratory failure, unspecified whether with hypoxia or hypercapnia: Secondary | ICD-10-CM | POA: Diagnosis not present

## 2022-12-10 DIAGNOSIS — E785 Hyperlipidemia, unspecified: Secondary | ICD-10-CM | POA: Diagnosis not present

## 2022-12-10 DIAGNOSIS — R0902 Hypoxemia: Secondary | ICD-10-CM | POA: Diagnosis not present

## 2022-12-10 DIAGNOSIS — I4891 Unspecified atrial fibrillation: Secondary | ICD-10-CM | POA: Diagnosis not present

## 2022-12-10 DIAGNOSIS — L89323 Pressure ulcer of left buttock, stage 3: Secondary | ICD-10-CM | POA: Diagnosis not present

## 2022-12-10 DIAGNOSIS — J441 Chronic obstructive pulmonary disease with (acute) exacerbation: Secondary | ICD-10-CM | POA: Diagnosis not present

## 2022-12-10 DIAGNOSIS — I498 Other specified cardiac arrhythmias: Secondary | ICD-10-CM | POA: Diagnosis not present

## 2022-12-11 DIAGNOSIS — J441 Chronic obstructive pulmonary disease with (acute) exacerbation: Secondary | ICD-10-CM | POA: Diagnosis not present

## 2022-12-12 DIAGNOSIS — Z7409 Other reduced mobility: Secondary | ICD-10-CM | POA: Insufficient documentation

## 2022-12-14 ENCOUNTER — Ambulatory Visit (HOSPITAL_COMMUNITY): Payer: Medicare HMO

## 2022-12-15 DIAGNOSIS — F418 Other specified anxiety disorders: Secondary | ICD-10-CM | POA: Diagnosis not present

## 2022-12-15 DIAGNOSIS — G8929 Other chronic pain: Secondary | ICD-10-CM | POA: Diagnosis not present

## 2022-12-15 DIAGNOSIS — M179 Osteoarthritis of knee, unspecified: Secondary | ICD-10-CM | POA: Diagnosis not present

## 2022-12-15 DIAGNOSIS — R110 Nausea: Secondary | ICD-10-CM | POA: Diagnosis not present

## 2022-12-15 DIAGNOSIS — B370 Candidal stomatitis: Secondary | ICD-10-CM | POA: Diagnosis not present

## 2022-12-15 DIAGNOSIS — F419 Anxiety disorder, unspecified: Secondary | ICD-10-CM | POA: Diagnosis not present

## 2022-12-15 DIAGNOSIS — J449 Chronic obstructive pulmonary disease, unspecified: Secondary | ICD-10-CM | POA: Diagnosis not present

## 2022-12-15 DIAGNOSIS — J9610 Chronic respiratory failure, unspecified whether with hypoxia or hypercapnia: Secondary | ICD-10-CM | POA: Diagnosis not present

## 2022-12-15 DIAGNOSIS — R2681 Unsteadiness on feet: Secondary | ICD-10-CM | POA: Diagnosis not present

## 2022-12-15 DIAGNOSIS — R739 Hyperglycemia, unspecified: Secondary | ICD-10-CM | POA: Diagnosis not present

## 2022-12-15 DIAGNOSIS — R531 Weakness: Secondary | ICD-10-CM | POA: Diagnosis not present

## 2022-12-15 DIAGNOSIS — I959 Hypotension, unspecified: Secondary | ICD-10-CM | POA: Diagnosis not present

## 2022-12-15 DIAGNOSIS — S30810D Abrasion of lower back and pelvis, subsequent encounter: Secondary | ICD-10-CM | POA: Diagnosis not present

## 2022-12-15 DIAGNOSIS — R0902 Hypoxemia: Secondary | ICD-10-CM | POA: Diagnosis not present

## 2022-12-15 DIAGNOSIS — R262 Difficulty in walking, not elsewhere classified: Secondary | ICD-10-CM | POA: Diagnosis not present

## 2022-12-15 DIAGNOSIS — R0602 Shortness of breath: Secondary | ICD-10-CM | POA: Diagnosis not present

## 2022-12-15 DIAGNOSIS — F411 Generalized anxiety disorder: Secondary | ICD-10-CM | POA: Diagnosis not present

## 2022-12-15 DIAGNOSIS — K219 Gastro-esophageal reflux disease without esophagitis: Secondary | ICD-10-CM | POA: Diagnosis not present

## 2022-12-15 DIAGNOSIS — R5381 Other malaise: Secondary | ICD-10-CM | POA: Diagnosis not present

## 2022-12-15 DIAGNOSIS — E039 Hypothyroidism, unspecified: Secondary | ICD-10-CM | POA: Diagnosis not present

## 2022-12-15 DIAGNOSIS — J441 Chronic obstructive pulmonary disease with (acute) exacerbation: Secondary | ICD-10-CM | POA: Diagnosis not present

## 2022-12-15 DIAGNOSIS — F4320 Adjustment disorder, unspecified: Secondary | ICD-10-CM | POA: Diagnosis not present

## 2022-12-15 DIAGNOSIS — R6889 Other general symptoms and signs: Secondary | ICD-10-CM | POA: Diagnosis not present

## 2022-12-15 DIAGNOSIS — E785 Hyperlipidemia, unspecified: Secondary | ICD-10-CM | POA: Diagnosis not present

## 2022-12-15 DIAGNOSIS — M6250 Muscle wasting and atrophy, not elsewhere classified, unspecified site: Secondary | ICD-10-CM | POA: Diagnosis not present

## 2022-12-15 DIAGNOSIS — Z743 Need for continuous supervision: Secondary | ICD-10-CM | POA: Diagnosis not present

## 2022-12-15 DIAGNOSIS — S30810A Abrasion of lower back and pelvis, initial encounter: Secondary | ICD-10-CM | POA: Diagnosis not present

## 2022-12-15 DIAGNOSIS — E871 Hypo-osmolality and hyponatremia: Secondary | ICD-10-CM | POA: Diagnosis not present

## 2022-12-15 DIAGNOSIS — G894 Chronic pain syndrome: Secondary | ICD-10-CM | POA: Diagnosis not present

## 2022-12-15 DIAGNOSIS — F3131 Bipolar disorder, current episode depressed, mild: Secondary | ICD-10-CM | POA: Diagnosis not present

## 2022-12-17 DIAGNOSIS — F418 Other specified anxiety disorders: Secondary | ICD-10-CM | POA: Diagnosis not present

## 2022-12-17 DIAGNOSIS — E785 Hyperlipidemia, unspecified: Secondary | ICD-10-CM | POA: Diagnosis not present

## 2022-12-17 DIAGNOSIS — G8929 Other chronic pain: Secondary | ICD-10-CM | POA: Diagnosis not present

## 2022-12-17 DIAGNOSIS — I959 Hypotension, unspecified: Secondary | ICD-10-CM | POA: Diagnosis not present

## 2022-12-17 DIAGNOSIS — J449 Chronic obstructive pulmonary disease, unspecified: Secondary | ICD-10-CM | POA: Diagnosis not present

## 2022-12-17 DIAGNOSIS — R739 Hyperglycemia, unspecified: Secondary | ICD-10-CM | POA: Diagnosis not present

## 2022-12-17 DIAGNOSIS — E871 Hypo-osmolality and hyponatremia: Secondary | ICD-10-CM | POA: Diagnosis not present

## 2022-12-17 DIAGNOSIS — K219 Gastro-esophageal reflux disease without esophagitis: Secondary | ICD-10-CM | POA: Diagnosis not present

## 2022-12-20 DIAGNOSIS — B370 Candidal stomatitis: Secondary | ICD-10-CM | POA: Diagnosis not present

## 2022-12-20 DIAGNOSIS — R0902 Hypoxemia: Secondary | ICD-10-CM | POA: Diagnosis not present

## 2022-12-20 DIAGNOSIS — I959 Hypotension, unspecified: Secondary | ICD-10-CM | POA: Diagnosis not present

## 2022-12-20 DIAGNOSIS — R0602 Shortness of breath: Secondary | ICD-10-CM | POA: Diagnosis not present

## 2022-12-20 DIAGNOSIS — R531 Weakness: Secondary | ICD-10-CM | POA: Diagnosis not present

## 2022-12-20 DIAGNOSIS — R2681 Unsteadiness on feet: Secondary | ICD-10-CM | POA: Diagnosis not present

## 2022-12-20 DIAGNOSIS — R110 Nausea: Secondary | ICD-10-CM | POA: Diagnosis not present

## 2022-12-20 DIAGNOSIS — M179 Osteoarthritis of knee, unspecified: Secondary | ICD-10-CM | POA: Diagnosis not present

## 2022-12-20 DIAGNOSIS — G8929 Other chronic pain: Secondary | ICD-10-CM | POA: Diagnosis not present

## 2022-12-22 DIAGNOSIS — M179 Osteoarthritis of knee, unspecified: Secondary | ICD-10-CM | POA: Diagnosis not present

## 2022-12-22 DIAGNOSIS — R0902 Hypoxemia: Secondary | ICD-10-CM | POA: Diagnosis not present

## 2022-12-22 DIAGNOSIS — R531 Weakness: Secondary | ICD-10-CM | POA: Diagnosis not present

## 2022-12-22 DIAGNOSIS — S30810A Abrasion of lower back and pelvis, initial encounter: Secondary | ICD-10-CM | POA: Diagnosis not present

## 2022-12-22 DIAGNOSIS — R0602 Shortness of breath: Secondary | ICD-10-CM | POA: Diagnosis not present

## 2022-12-22 DIAGNOSIS — R2681 Unsteadiness on feet: Secondary | ICD-10-CM | POA: Diagnosis not present

## 2022-12-28 DIAGNOSIS — F418 Other specified anxiety disorders: Secondary | ICD-10-CM | POA: Diagnosis not present

## 2022-12-28 DIAGNOSIS — G8929 Other chronic pain: Secondary | ICD-10-CM | POA: Diagnosis not present

## 2022-12-28 DIAGNOSIS — R5381 Other malaise: Secondary | ICD-10-CM | POA: Diagnosis not present

## 2022-12-29 DIAGNOSIS — S30810D Abrasion of lower back and pelvis, subsequent encounter: Secondary | ICD-10-CM | POA: Diagnosis not present

## 2022-12-29 DIAGNOSIS — M179 Osteoarthritis of knee, unspecified: Secondary | ICD-10-CM | POA: Diagnosis not present

## 2022-12-29 DIAGNOSIS — R0902 Hypoxemia: Secondary | ICD-10-CM | POA: Diagnosis not present

## 2022-12-29 DIAGNOSIS — R531 Weakness: Secondary | ICD-10-CM | POA: Diagnosis not present

## 2022-12-29 DIAGNOSIS — R2681 Unsteadiness on feet: Secondary | ICD-10-CM | POA: Diagnosis not present

## 2022-12-29 DIAGNOSIS — R0602 Shortness of breath: Secondary | ICD-10-CM | POA: Diagnosis not present

## 2022-12-30 DIAGNOSIS — E871 Hypo-osmolality and hyponatremia: Secondary | ICD-10-CM | POA: Diagnosis not present

## 2022-12-30 DIAGNOSIS — I959 Hypotension, unspecified: Secondary | ICD-10-CM | POA: Diagnosis not present

## 2022-12-30 DIAGNOSIS — J449 Chronic obstructive pulmonary disease, unspecified: Secondary | ICD-10-CM | POA: Diagnosis not present

## 2022-12-30 DIAGNOSIS — F418 Other specified anxiety disorders: Secondary | ICD-10-CM | POA: Diagnosis not present

## 2022-12-30 DIAGNOSIS — E785 Hyperlipidemia, unspecified: Secondary | ICD-10-CM | POA: Diagnosis not present

## 2022-12-30 DIAGNOSIS — K219 Gastro-esophageal reflux disease without esophagitis: Secondary | ICD-10-CM | POA: Diagnosis not present

## 2022-12-30 DIAGNOSIS — G8929 Other chronic pain: Secondary | ICD-10-CM | POA: Diagnosis not present

## 2023-01-04 DIAGNOSIS — R6889 Other general symptoms and signs: Secondary | ICD-10-CM | POA: Diagnosis not present

## 2023-01-12 ENCOUNTER — Encounter: Payer: Self-pay | Admitting: *Deleted

## 2023-01-23 NOTE — Progress Notes (Deleted)
Synopsis: Referred for dyspnea by Rodrigo Ran, MD  Subjective:   PATIENT ID: [name] GENDER: female DOB: [birth year], MRN: [id number]  No chief complaint on file.  67yF with histroy of COPD, asthma,  OSA, GERD, hypothyroid, chronic venous insufficiency  She is currently on the only course of steroids that she's been on this year. Takes symbicort 2 puffs twice daily, rinses mouth after each use.  She says her DOE is to 50 feet or so worsening over the last year or so.   She uses oxygen at home 'when I need it.' DME supplier is [employer].    No family history of lung disease that she can recall  She can't remember where she worked. Smoked 20 years 1ppd, still smoking.   Interval HPI Started on trelegy after last visit due to cost  Was still smoking last visit  Fluconazole for thrush last visit  PFT   Referred for lung cancer screening  Admission for yersinia enterocolitica gastroenteritis 5/6 at [facility], AECOPD 5/10 at [facility]  Otherwise pertinent review of systems is negative.   Past Medical History:  Diagnosis Date   COPD (chronic obstructive pulmonary disease) (HCC)    Gout    Hypercholesteremia    Sepsis (HCC)      No family history on file.   Past Surgical History:  Procedure Laterality Date   ABDOMINAL HYSTERECTOMY     ROTATOR CUFF REPAIR      Social History   Socioeconomic History   Marital status: Single    Spouse name: Not on file   Number of children: Not on file   Years of education: Not on file   Highest education level: Not on file  Occupational History   Not on file  Tobacco Use   Smoking status: Former    Packs/day: 1.00    Years: 50.00    Additional pack years: 0.00    Total pack years: 50.00    Types: Cigarettes    Quit date: 04/02/2022    Years since quitting: 0.8   Smokeless tobacco: Never  Vaping Use   Vaping Use: Never used  Substance and Sexual Activity   Alcohol use: No    Alcohol/week: 0.0 standard drinks of alcohol    Drug use: No   Sexual activity: Not on file  Other Topics Concern   Not on file  Social History Narrative   Not on file   Social Determinants of Health   Financial Resource Strain: Medium Risk (04/04/2018)   Overall Financial Resource Strain (CARDIA)    Difficulty of Paying Living Expenses: Somewhat hard  Food Insecurity: No Food Insecurity (12/07/2022)   Hunger Vital Sign    Worried About Running Out of Food in the Last Year: Never true    Ran Out of Food in the Last Year: Never true  Transportation Needs: No Transportation Needs (12/07/2022)   PRAPARE - Administrator, Civil Service (Medical): No    Lack of Transportation (Non-Medical): No  Physical Activity: Insufficiently Active (04/04/2018)   Exercise Vital Sign    Days of Exercise per Week: 7 days    Minutes of Exercise per Session: 10 min  Stress: Stress Concern Present (04/04/2018)   [employer] of Occupational Health - Occupational Stress Questionnaire    Feeling of Stress : To some extent  Social Connections: Not on file  Intimate Partner Violence: Not At Risk (12/07/2022)   Humiliation, Afraid, Rape, and Kick questionnaire    Fear of Current  or Ex-Partner: No    Emotionally Abused: No    Physically Abused: No    Sexually Abused: No     Allergies  Allergen Reactions   Gemfibrozil Other (See Comments)    Sick to stomach   Nitroglycerin Other (See Comments)     Cough, Irritation   Other Other (See Comments)    Grape fruit juice - unknown   Pravastatin Other (See Comments)    Dizzy   Sulfamethoxazole-Trimethoprim Nausea And Vomiting   Tyloxapol Other (See Comments)     Does not work     Outpatient Medications Prior to Visit  Medication Sig Dispense Refill   ALPRAZolam (XANAX) 1 MG tablet Take 1 mg by mouth 3 (three) times daily as needed for anxiety.     ARIPiprazole (ABILIFY) 10 MG tablet Take 10 mg by mouth daily.     atorvastatin (LIPITOR) 10 MG tablet Take 10 mg by mouth daily.      Budeson-Glycopyrrol-Formoterol (BREZTRI AEROSPHERE) 160-9-4.8 MCG/ACT AERO Inhale 2 puffs into the lungs in the morning and at bedtime. 5.9 g 0   buPROPion (WELLBUTRIN XL) 150 MG 24 hr tablet Take 150 mg by mouth every evening.      cetirizine (ZYRTEC) 10 MG tablet Take 10 mg by mouth at bedtime.     esomeprazole (NEXIUM) 40 MG capsule Take 40 mg by mouth daily.     gabapentin (NEURONTIN) 300 MG capsule Take 300 mg by mouth 3 (three) times daily.     hydrocortisone cream 0.5 % Apply topically 3 (three) times daily as needed for itching. 30 g 0   ipratropium-albuterol (DUONEB) 0.5-2.5 (3) MG/3ML SOLN use 1 vial by nebulization every 4 (four) hours as needed. (Patient taking differently: Take 3 mLs by nebulization every 4 (four) hours as needed (wheezing).) 360 mL 0   levothyroxine (SYNTHROID) 150 MCG tablet Take 150 mcg by mouth daily before breakfast.     oxyCODONE-acetaminophen (PERCOCET/ROXICET) 5-325 MG tablet Take 1-2 tablets by mouth every 6 (six) hours as needed for moderate pain or severe pain.     venlafaxine XR (EFFEXOR-XR) 150 MG 24 hr capsule Take 150 mg by mouth at bedtime.     No facility-administered medications prior to visit.       Objective:   Physical Exam:  General appearance: 67 y.o., female, NAD, conversant  Eyes: anicteric sclerae; PERRL, tracking appropriately HENT: NCAT; MMM Neck: Trachea midline; no lymphadenopathy, no JVD Lungs: wheeze/rhonchi bilateral, with normal respiratory effort CV: RRR, no murmur  Abdomen: Soft, non-tender; non-distended, BS present  Extremities: No peripheral edema, warm Skin: Normal turgor and texture; no rash Psych: Appropriate affect Neuro: Alert and oriented to person and place, no focal deficit     There were no vitals filed for this visit.    on RA BMI Readings from Last 3 Encounters:  12/08/22 33.60 kg/m  10/14/22 33.45 kg/m  09/05/21 34.28 kg/m   Wt Readings from Last 3 Encounters:  12/08/22 195 lb 12.3 oz (88.8  kg)  10/14/22 201 lb (91.2 kg)  09/05/21 206 lb (93.4 kg)     CBC    Component Value Date/Time   WBC 6.6 12/09/2022 0444   RBC 4.93 12/09/2022 0444   HGB 14.8 12/09/2022 0444   HCT 49.7 (H) 12/09/2022 0444   PLT 158 12/09/2022 0444   MCV 100.8 (H) 12/09/2022 0444   MCH 30.0 12/09/2022 0444   MCHC 29.8 (L) 12/09/2022 0444   RDW 22.5 (H) 12/09/2022 0444   LYMPHSABS 1.6 12/09/2022  0444   MONOABS 0.3 12/09/2022 0444   EOSABS 0.1 12/09/2022 0444   BASOSABS 0.1 12/09/2022 0444    Eos 100-200  Chest Imaging: CTA Chest 10/28/21 with faint/subtle multifocal TIB, bronchial wall thickening  Pulmonary Functions Testing Results:     No data to display            Echocardiogram 08/15/21:   1. Left ventricular ejection fraction, by estimation, is 50 to 55%. The  left ventricle has low normal function. The left ventricle has no regional  wall motion abnormalities. Left ventricular diastolic parameters are  consistent with Grade I diastolic  dysfunction (impaired relaxation).   2. Right ventricular systolic function is normal. The right ventricular  size is normal. There is mildly elevated pulmonary artery systolic  pressure. The estimated right ventricular systolic pressure is 36.9 mmHg.   3. The mitral valve is normal in structure. No evidence of mitral valve  regurgitation.   4. The aortic valve was not well visualized. Aortic valve regurgitation  is not visualized. No aortic stenosis is present.   5. The inferior vena cava is dilated in size with >50% respiratory  variability, suggesting right atrial pressure of 8 mmHg.       Assessment & Plan:   # DOE # AECOPD # COPD gold functional group B  # Thrush Mainly under tongue vs whitish discoloration around aphthous ulcer  # Smoking 20 py active   Plan: - breztri 2 puffs twice daily, rinse mouth after each use - continue steroid taper - stop nystatin - start fluconazole 100 mg daily for thrush - referral made for  lung cancer screening, due now - smoking cessation strongly encouraged     Omar Person, MD [facility] 01/23/2023 11:43 AM

## 2023-01-25 ENCOUNTER — Encounter: Payer: Self-pay | Admitting: Student

## 2023-01-25 ENCOUNTER — Ambulatory Visit: Payer: Medicare HMO | Admitting: Student

## 2023-02-28 ENCOUNTER — Telehealth: Payer: Self-pay | Admitting: Adult Health

## 2023-03-01 NOTE — Telephone Encounter (Signed)
Pt. Calling back in need of inhaler

## 2023-03-02 MED ORDER — BREZTRI AEROSPHERE 160-9-4.8 MCG/ACT IN AERO: 2.0000 | INHALATION_SPRAY | Freq: Two times a day (BID) | RESPIRATORY_TRACT | 3 refills | Status: DC

## 2023-03-02 NOTE — Telephone Encounter (Signed)
Beth patient is requesting a refill of Breztri. She was Dr. Paulina Fusi patient please advise if okay to send refill and I will also get patient scheduled with new provider

## 2023-03-02 NOTE — Telephone Encounter (Signed)
Perfectly fine, I will send

## 2023-03-02 NOTE — Telephone Encounter (Signed)
Pt. Called back made her aware that the inhaler got called into pharmacy

## 2023-03-03 ENCOUNTER — Telehealth: Payer: Self-pay | Admitting: Adult Health

## 2023-03-04 DIAGNOSIS — J9610 Chronic respiratory failure, unspecified whether with hypoxia or hypercapnia: Secondary | ICD-10-CM | POA: Diagnosis not present

## 2023-03-04 DIAGNOSIS — J309 Allergic rhinitis, unspecified: Secondary | ICD-10-CM | POA: Diagnosis not present

## 2023-03-04 DIAGNOSIS — D509 Iron deficiency anemia, unspecified: Secondary | ICD-10-CM | POA: Diagnosis not present

## 2023-03-04 DIAGNOSIS — J441 Chronic obstructive pulmonary disease with (acute) exacerbation: Secondary | ICD-10-CM | POA: Diagnosis not present

## 2023-03-04 DIAGNOSIS — F339 Major depressive disorder, recurrent, unspecified: Secondary | ICD-10-CM | POA: Diagnosis not present

## 2023-03-04 DIAGNOSIS — E039 Hypothyroidism, unspecified: Secondary | ICD-10-CM | POA: Diagnosis not present

## 2023-03-04 DIAGNOSIS — I10 Essential (primary) hypertension: Secondary | ICD-10-CM | POA: Diagnosis not present

## 2023-03-04 DIAGNOSIS — F419 Anxiety disorder, unspecified: Secondary | ICD-10-CM | POA: Diagnosis not present

## 2023-03-04 DIAGNOSIS — R6889 Other general symptoms and signs: Secondary | ICD-10-CM | POA: Diagnosis not present

## 2023-03-04 DIAGNOSIS — J45909 Unspecified asthma, uncomplicated: Secondary | ICD-10-CM | POA: Diagnosis not present

## 2023-03-04 DIAGNOSIS — E785 Hyperlipidemia, unspecified: Secondary | ICD-10-CM | POA: Diagnosis not present

## 2023-03-08 NOTE — Telephone Encounter (Signed)
Can we check to see if she got this , if not can leave samples up front at desk . Can we check with pharmacy .

## 2023-03-14 MED ORDER — BREZTRI AEROSPHERE 160-9-4.8 MCG/ACT IN AERO: 2.0000 | INHALATION_SPRAY | Freq: Two times a day (BID) | RESPIRATORY_TRACT | 3 refills | Status: AC

## 2023-03-14 NOTE — Telephone Encounter (Signed)
Spoke with patient.  Reordered breztri to different pharmacy.  She will stop by the office if the pharmacy is not able to get it.

## 2023-03-30 DIAGNOSIS — E782 Mixed hyperlipidemia: Secondary | ICD-10-CM | POA: Diagnosis not present

## 2023-03-30 DIAGNOSIS — R6889 Other general symptoms and signs: Secondary | ICD-10-CM | POA: Diagnosis not present

## 2023-03-30 DIAGNOSIS — F339 Major depressive disorder, recurrent, unspecified: Secondary | ICD-10-CM | POA: Diagnosis not present

## 2023-03-30 DIAGNOSIS — Z9981 Dependence on supplemental oxygen: Secondary | ICD-10-CM | POA: Diagnosis not present

## 2023-03-30 DIAGNOSIS — E039 Hypothyroidism, unspecified: Secondary | ICD-10-CM | POA: Diagnosis not present

## 2023-03-30 DIAGNOSIS — D509 Iron deficiency anemia, unspecified: Secondary | ICD-10-CM | POA: Diagnosis not present

## 2023-03-30 DIAGNOSIS — J449 Chronic obstructive pulmonary disease, unspecified: Secondary | ICD-10-CM | POA: Diagnosis not present

## 2023-03-30 DIAGNOSIS — Z23 Encounter for immunization: Secondary | ICD-10-CM | POA: Diagnosis not present

## 2023-03-30 DIAGNOSIS — I87331 Chronic venous hypertension (idiopathic) with ulcer and inflammation of right lower extremity: Secondary | ICD-10-CM | POA: Diagnosis not present

## 2023-03-30 DIAGNOSIS — R32 Unspecified urinary incontinence: Secondary | ICD-10-CM | POA: Diagnosis not present

## 2023-03-30 DIAGNOSIS — D692 Other nonthrombocytopenic purpura: Secondary | ICD-10-CM | POA: Diagnosis not present

## 2023-03-30 DIAGNOSIS — F17200 Nicotine dependence, unspecified, uncomplicated: Secondary | ICD-10-CM | POA: Diagnosis not present

## 2023-04-19 DIAGNOSIS — R3981 Functional urinary incontinence: Secondary | ICD-10-CM | POA: Diagnosis not present

## 2023-04-27 DIAGNOSIS — H903 Sensorineural hearing loss, bilateral: Secondary | ICD-10-CM | POA: Diagnosis not present

## 2023-05-11 DIAGNOSIS — D509 Iron deficiency anemia, unspecified: Secondary | ICD-10-CM | POA: Diagnosis not present

## 2023-05-11 DIAGNOSIS — J45909 Unspecified asthma, uncomplicated: Secondary | ICD-10-CM | POA: Diagnosis not present

## 2023-05-11 DIAGNOSIS — R6889 Other general symptoms and signs: Secondary | ICD-10-CM | POA: Diagnosis not present

## 2023-05-11 DIAGNOSIS — E039 Hypothyroidism, unspecified: Secondary | ICD-10-CM | POA: Diagnosis not present

## 2023-05-11 DIAGNOSIS — Z9981 Dependence on supplemental oxygen: Secondary | ICD-10-CM | POA: Diagnosis not present

## 2023-05-11 DIAGNOSIS — K219 Gastro-esophageal reflux disease without esophagitis: Secondary | ICD-10-CM | POA: Diagnosis not present

## 2023-05-11 DIAGNOSIS — J449 Chronic obstructive pulmonary disease, unspecified: Secondary | ICD-10-CM | POA: Diagnosis not present

## 2023-05-11 DIAGNOSIS — R5383 Other fatigue: Secondary | ICD-10-CM | POA: Diagnosis not present

## 2023-05-11 DIAGNOSIS — F17200 Nicotine dependence, unspecified, uncomplicated: Secondary | ICD-10-CM | POA: Diagnosis not present

## 2023-05-11 DIAGNOSIS — D649 Anemia, unspecified: Secondary | ICD-10-CM | POA: Diagnosis not present

## 2023-05-11 DIAGNOSIS — J9610 Chronic respiratory failure, unspecified whether with hypoxia or hypercapnia: Secondary | ICD-10-CM | POA: Diagnosis not present

## 2023-05-25 DIAGNOSIS — H903 Sensorineural hearing loss, bilateral: Secondary | ICD-10-CM | POA: Diagnosis not present

## 2023-06-15 DIAGNOSIS — R6889 Other general symptoms and signs: Secondary | ICD-10-CM | POA: Diagnosis not present

## 2023-06-15 DIAGNOSIS — H6120 Impacted cerumen, unspecified ear: Secondary | ICD-10-CM | POA: Diagnosis not present

## 2023-06-15 DIAGNOSIS — J45909 Unspecified asthma, uncomplicated: Secondary | ICD-10-CM | POA: Diagnosis not present

## 2023-06-15 DIAGNOSIS — E039 Hypothyroidism, unspecified: Secondary | ICD-10-CM | POA: Diagnosis not present

## 2023-06-15 DIAGNOSIS — J9610 Chronic respiratory failure, unspecified whether with hypoxia or hypercapnia: Secondary | ICD-10-CM | POA: Diagnosis not present

## 2023-06-15 DIAGNOSIS — J449 Chronic obstructive pulmonary disease, unspecified: Secondary | ICD-10-CM | POA: Diagnosis not present

## 2023-06-15 DIAGNOSIS — L89102 Pressure ulcer of unspecified part of back, stage 2: Secondary | ICD-10-CM | POA: Diagnosis not present

## 2023-06-15 DIAGNOSIS — F17200 Nicotine dependence, unspecified, uncomplicated: Secondary | ICD-10-CM | POA: Diagnosis not present

## 2023-06-15 DIAGNOSIS — Z9981 Dependence on supplemental oxygen: Secondary | ICD-10-CM | POA: Diagnosis not present

## 2023-06-16 DIAGNOSIS — L89311 Pressure ulcer of right buttock, stage 1: Secondary | ICD-10-CM | POA: Diagnosis not present

## 2023-06-16 DIAGNOSIS — L89322 Pressure ulcer of left buttock, stage 2: Secondary | ICD-10-CM | POA: Diagnosis not present

## 2023-06-16 DIAGNOSIS — D692 Other nonthrombocytopenic purpura: Secondary | ICD-10-CM | POA: Diagnosis not present

## 2023-06-16 DIAGNOSIS — F418 Other specified anxiety disorders: Secondary | ICD-10-CM | POA: Diagnosis not present

## 2023-06-16 DIAGNOSIS — I87321 Chronic venous hypertension (idiopathic) with inflammation of right lower extremity: Secondary | ICD-10-CM | POA: Diagnosis not present

## 2023-06-16 DIAGNOSIS — I10 Essential (primary) hypertension: Secondary | ICD-10-CM | POA: Diagnosis not present

## 2023-06-16 DIAGNOSIS — J9610 Chronic respiratory failure, unspecified whether with hypoxia or hypercapnia: Secondary | ICD-10-CM | POA: Diagnosis not present

## 2023-06-16 DIAGNOSIS — F339 Major depressive disorder, recurrent, unspecified: Secondary | ICD-10-CM | POA: Diagnosis not present

## 2023-06-16 DIAGNOSIS — E039 Hypothyroidism, unspecified: Secondary | ICD-10-CM | POA: Diagnosis not present

## 2023-06-21 DIAGNOSIS — I87321 Chronic venous hypertension (idiopathic) with inflammation of right lower extremity: Secondary | ICD-10-CM | POA: Diagnosis not present

## 2023-06-21 DIAGNOSIS — E039 Hypothyroidism, unspecified: Secondary | ICD-10-CM | POA: Diagnosis not present

## 2023-06-21 DIAGNOSIS — J9610 Chronic respiratory failure, unspecified whether with hypoxia or hypercapnia: Secondary | ICD-10-CM | POA: Diagnosis not present

## 2023-06-21 DIAGNOSIS — L89311 Pressure ulcer of right buttock, stage 1: Secondary | ICD-10-CM | POA: Diagnosis not present

## 2023-06-21 DIAGNOSIS — F339 Major depressive disorder, recurrent, unspecified: Secondary | ICD-10-CM | POA: Diagnosis not present

## 2023-06-21 DIAGNOSIS — I10 Essential (primary) hypertension: Secondary | ICD-10-CM | POA: Diagnosis not present

## 2023-06-21 DIAGNOSIS — L89322 Pressure ulcer of left buttock, stage 2: Secondary | ICD-10-CM | POA: Diagnosis not present

## 2023-06-21 DIAGNOSIS — F418 Other specified anxiety disorders: Secondary | ICD-10-CM | POA: Diagnosis not present

## 2023-06-21 DIAGNOSIS — D692 Other nonthrombocytopenic purpura: Secondary | ICD-10-CM | POA: Diagnosis not present

## 2023-06-24 DIAGNOSIS — J9610 Chronic respiratory failure, unspecified whether with hypoxia or hypercapnia: Secondary | ICD-10-CM | POA: Diagnosis not present

## 2023-06-24 DIAGNOSIS — L89311 Pressure ulcer of right buttock, stage 1: Secondary | ICD-10-CM | POA: Diagnosis not present

## 2023-06-24 DIAGNOSIS — L89322 Pressure ulcer of left buttock, stage 2: Secondary | ICD-10-CM | POA: Diagnosis not present

## 2023-06-24 DIAGNOSIS — I87321 Chronic venous hypertension (idiopathic) with inflammation of right lower extremity: Secondary | ICD-10-CM | POA: Diagnosis not present

## 2023-06-24 DIAGNOSIS — E039 Hypothyroidism, unspecified: Secondary | ICD-10-CM | POA: Diagnosis not present

## 2023-06-24 DIAGNOSIS — F339 Major depressive disorder, recurrent, unspecified: Secondary | ICD-10-CM | POA: Diagnosis not present

## 2023-06-24 DIAGNOSIS — F418 Other specified anxiety disorders: Secondary | ICD-10-CM | POA: Diagnosis not present

## 2023-06-24 DIAGNOSIS — D692 Other nonthrombocytopenic purpura: Secondary | ICD-10-CM | POA: Diagnosis not present

## 2023-06-24 DIAGNOSIS — I10 Essential (primary) hypertension: Secondary | ICD-10-CM | POA: Diagnosis not present

## 2023-06-27 DIAGNOSIS — F339 Major depressive disorder, recurrent, unspecified: Secondary | ICD-10-CM | POA: Diagnosis not present

## 2023-06-27 DIAGNOSIS — L89311 Pressure ulcer of right buttock, stage 1: Secondary | ICD-10-CM | POA: Diagnosis not present

## 2023-06-27 DIAGNOSIS — D692 Other nonthrombocytopenic purpura: Secondary | ICD-10-CM | POA: Diagnosis not present

## 2023-06-27 DIAGNOSIS — L89322 Pressure ulcer of left buttock, stage 2: Secondary | ICD-10-CM | POA: Diagnosis not present

## 2023-06-27 DIAGNOSIS — E039 Hypothyroidism, unspecified: Secondary | ICD-10-CM | POA: Diagnosis not present

## 2023-06-27 DIAGNOSIS — I10 Essential (primary) hypertension: Secondary | ICD-10-CM | POA: Diagnosis not present

## 2023-06-27 DIAGNOSIS — J9610 Chronic respiratory failure, unspecified whether with hypoxia or hypercapnia: Secondary | ICD-10-CM | POA: Diagnosis not present

## 2023-06-27 DIAGNOSIS — I87321 Chronic venous hypertension (idiopathic) with inflammation of right lower extremity: Secondary | ICD-10-CM | POA: Diagnosis not present

## 2023-06-27 DIAGNOSIS — F418 Other specified anxiety disorders: Secondary | ICD-10-CM | POA: Diagnosis not present

## 2023-06-28 DIAGNOSIS — J9610 Chronic respiratory failure, unspecified whether with hypoxia or hypercapnia: Secondary | ICD-10-CM | POA: Diagnosis not present

## 2023-06-28 DIAGNOSIS — J4489 Other specified chronic obstructive pulmonary disease: Secondary | ICD-10-CM | POA: Diagnosis not present

## 2023-06-28 DIAGNOSIS — F339 Major depressive disorder, recurrent, unspecified: Secondary | ICD-10-CM | POA: Diagnosis not present

## 2023-06-28 DIAGNOSIS — L89311 Pressure ulcer of right buttock, stage 1: Secondary | ICD-10-CM | POA: Diagnosis not present

## 2023-06-28 DIAGNOSIS — E039 Hypothyroidism, unspecified: Secondary | ICD-10-CM | POA: Diagnosis not present

## 2023-06-28 DIAGNOSIS — I10 Essential (primary) hypertension: Secondary | ICD-10-CM | POA: Diagnosis not present

## 2023-06-28 DIAGNOSIS — D692 Other nonthrombocytopenic purpura: Secondary | ICD-10-CM | POA: Diagnosis not present

## 2023-06-28 DIAGNOSIS — L89322 Pressure ulcer of left buttock, stage 2: Secondary | ICD-10-CM | POA: Diagnosis not present

## 2023-07-12 DIAGNOSIS — E039 Hypothyroidism, unspecified: Secondary | ICD-10-CM | POA: Diagnosis not present

## 2023-07-12 DIAGNOSIS — J449 Chronic obstructive pulmonary disease, unspecified: Secondary | ICD-10-CM | POA: Diagnosis not present

## 2023-07-12 DIAGNOSIS — J9610 Chronic respiratory failure, unspecified whether with hypoxia or hypercapnia: Secondary | ICD-10-CM | POA: Diagnosis not present

## 2023-07-12 DIAGNOSIS — M25519 Pain in unspecified shoulder: Secondary | ICD-10-CM | POA: Diagnosis not present

## 2023-07-12 DIAGNOSIS — F17200 Nicotine dependence, unspecified, uncomplicated: Secondary | ICD-10-CM | POA: Diagnosis not present

## 2023-07-12 DIAGNOSIS — R6889 Other general symptoms and signs: Secondary | ICD-10-CM | POA: Diagnosis not present

## 2023-07-12 DIAGNOSIS — E782 Mixed hyperlipidemia: Secondary | ICD-10-CM | POA: Diagnosis not present

## 2023-07-12 DIAGNOSIS — Z9981 Dependence on supplemental oxygen: Secondary | ICD-10-CM | POA: Diagnosis not present

## 2023-07-12 DIAGNOSIS — F339 Major depressive disorder, recurrent, unspecified: Secondary | ICD-10-CM | POA: Diagnosis not present

## 2023-07-15 DIAGNOSIS — F418 Other specified anxiety disorders: Secondary | ICD-10-CM | POA: Diagnosis not present

## 2023-07-15 DIAGNOSIS — L89311 Pressure ulcer of right buttock, stage 1: Secondary | ICD-10-CM | POA: Diagnosis not present

## 2023-07-15 DIAGNOSIS — D692 Other nonthrombocytopenic purpura: Secondary | ICD-10-CM | POA: Diagnosis not present

## 2023-07-15 DIAGNOSIS — I87321 Chronic venous hypertension (idiopathic) with inflammation of right lower extremity: Secondary | ICD-10-CM | POA: Diagnosis not present

## 2023-07-15 DIAGNOSIS — L89322 Pressure ulcer of left buttock, stage 2: Secondary | ICD-10-CM | POA: Diagnosis not present

## 2023-07-15 DIAGNOSIS — J9610 Chronic respiratory failure, unspecified whether with hypoxia or hypercapnia: Secondary | ICD-10-CM | POA: Diagnosis not present

## 2023-07-15 DIAGNOSIS — F339 Major depressive disorder, recurrent, unspecified: Secondary | ICD-10-CM | POA: Diagnosis not present

## 2023-07-15 DIAGNOSIS — E039 Hypothyroidism, unspecified: Secondary | ICD-10-CM | POA: Diagnosis not present

## 2023-07-15 DIAGNOSIS — I10 Essential (primary) hypertension: Secondary | ICD-10-CM | POA: Diagnosis not present

## 2023-09-10 ENCOUNTER — Emergency Department (HOSPITAL_COMMUNITY): Payer: 59

## 2023-09-10 ENCOUNTER — Emergency Department (HOSPITAL_COMMUNITY)
Admission: EM | Admit: 2023-09-10 | Discharge: 2023-09-10 | Disposition: A | Discharge status: Home / Self Care | Payer: 59 | Attending: Emergency Medicine | Admitting: Emergency Medicine

## 2023-09-10 DIAGNOSIS — W01198A Fall on same level from slipping, tripping and stumbling with subsequent striking against other object, initial encounter: Secondary | ICD-10-CM | POA: Insufficient documentation

## 2023-09-10 DIAGNOSIS — I10 Essential (primary) hypertension: Secondary | ICD-10-CM | POA: Diagnosis not present

## 2023-09-10 DIAGNOSIS — Z7951 Long term (current) use of inhaled steroids: Secondary | ICD-10-CM | POA: Insufficient documentation

## 2023-09-10 DIAGNOSIS — R0789 Other chest pain: Secondary | ICD-10-CM | POA: Insufficient documentation

## 2023-09-10 DIAGNOSIS — Z79899 Other long term (current) drug therapy: Secondary | ICD-10-CM | POA: Insufficient documentation

## 2023-09-10 DIAGNOSIS — J449 Chronic obstructive pulmonary disease, unspecified: Secondary | ICD-10-CM | POA: Diagnosis not present

## 2023-09-10 DIAGNOSIS — Y92002 Bathroom of unspecified non-institutional (private) residence single-family (private) house as the place of occurrence of the external cause: Secondary | ICD-10-CM | POA: Insufficient documentation

## 2023-09-10 MED ORDER — LIDOCAINE 5 % EX PTCH: 1.0000 | MEDICATED_PATCH | CUTANEOUS | 0 refills | Status: DC

## 2023-09-10 MED ORDER — OXYCODONE-ACETAMINOPHEN 5-325 MG PO TABS: 1.0000 | ORAL_TABLET | Freq: Four times a day (QID) | ORAL | 0 refills | Status: AC | PRN

## 2023-09-10 MED ORDER — OXYCODONE-ACETAMINOPHEN 5-325 MG PO TABS
1.0000 | ORAL_TABLET | Freq: Once | ORAL | Status: AC
  Administered 2023-09-10: 1 via ORAL
  Filled 2023-09-10: qty 1

## 2023-09-10 NOTE — Discharge Instructions (Addendum)
 You were seen in the emergency room today after a fall.  Your chest x-ray does not show rib fractures but as discussed CT scan is more sensitive to identifying rib fractures, but you were not able to stay to obtain scan. Please return with new or worsening symptoms.  Please follow-up with primary care.  I have sent medications to your pharmacy please take as prescribed.  You can alternate Tylenol  1000 mg 4 times a day, you can also take ibuprofen up to 3 times a day.  Apply lidocaine  patch to area of pain.  Take oxycodone  for breakthrough pain.

## 2023-09-10 NOTE — ED Provider Notes (Signed)
 [place] EMERGENCY DEPARTMENT AT [facility] Provider Note   CSN: [id number] Arrival date & time: 09/10/23  1423     History  Chief Complaint  Patient presents with   [place]    [name] is a 68 y.o. female with past medical history of COPD, hypertension, hyperlipidemia opioid dependence presenting to emergency room after fall 3 days ago.  Patient reports that she was standing up from toilet when pulling up her pants when she lost her balance and fell hitting the side of her shower.  She reports she primarily landed on the left lower chest.  Reports she has pain when taking deep breath then and she has been trying to use at home pain medication prescribed to her for her knee however she has not had improvement of symptoms.  Denies any shortness of breath, cough or other injuries.  Denies any abdominal pain, nausea vomiting diarrhea.  Patient is not on a blood thinner.  Did not hit her head.   Fall Associated symptoms include chest pain.       Home Medications Prior to Admission medications   Medication Sig Start Date End Date Taking? Authorizing Provider  ALPRAZolam  (XANAX ) 1 MG tablet Take 1 mg by mouth 3 (three) times daily as needed for anxiety.    [provider]  ARIPiprazole  (ABILIFY ) 10 MG tablet Take 10 mg by mouth daily. 10/21/22   [provider]  atorvastatin  (LIPITOR) 10 MG tablet Take 10 mg by mouth daily.    [provider]  Budeson-Glycopyrrol-Formoterol  (BREZTRI  AEROSPHERE) 160-9-4.8 MCG/ACT AERO Inhale 2 puffs into the lungs in the morning and at bedtime. 03/14/23   Parrett, Madelin RAMAN, NP  buPROPion  (WELLBUTRIN  XL) 150 MG 24 hr tablet Take 150 mg by mouth every evening.     [provider]  cetirizine (ZYRTEC) 10 MG tablet Take 10 mg by mouth at bedtime. 08/07/22   [provider]  esomeprazole (NEXIUM) 40 MG capsule Take 40 mg by mouth daily. 10/20/22   [provider]  gabapentin  (NEURONTIN ) 300 MG  capsule Take 300 mg by mouth 3 (three) times daily.    [provider]  hydrocortisone  cream 0.5 % Apply topically 3 (three) times daily as needed for itching. 09/15/21   Pokhrel, Laxman, MD  ipratropium-albuterol  (DUONEB) 0.5-2.5 (3) MG/3ML SOLN use 1 vial by nebulization every 4 (four) hours as needed. Patient taking differently: Take 3 mLs by nebulization every 4 (four) hours as needed (wheezing). 03/06/21   Briana Elgin LABOR, MD  levothyroxine  (SYNTHROID ) 150 MCG tablet Take 150 mcg by mouth daily before breakfast.    [provider]  oxyCODONE -acetaminophen  (PERCOCET/ROXICET) 5-325 MG tablet Take 1-2 tablets by mouth every 6 (six) hours as needed for moderate pain or severe pain.    [provider]  venlafaxine  XR (EFFEXOR -XR) 150 MG 24 hr capsule Take 150 mg by mouth at bedtime.    [provider]      Allergies    Gemfibrozil, Nitroglycerin, Other, Pravastatin, Sulfamethoxazole-trimethoprim, and Tyloxapol    Review of Systems   Review of Systems  Cardiovascular:  Positive for chest pain.    Physical Exam Updated Vital Signs BP 107/79 (BP Location: Right Arm)   Pulse 86   Temp 98.6 F (37 C) (Oral)   Resp 18   SpO2 94%  Physical Exam Vitals and nursing note reviewed.  Constitutional:      General: She is not in acute distress.    Appearance: She is  not toxic-appearing.  HENT:     Head: Normocephalic and atraumatic.  Eyes:     General: No scleral icterus.    Conjunctiva/sclera: Conjunctivae normal.  Cardiovascular:     Rate and Rhythm: Normal rate and regular rhythm.     Pulses: Normal pulses.     Heart sounds: Normal heart sounds.  Pulmonary:     Effort: Pulmonary effort is normal. No respiratory distress.     Breath sounds: Normal breath sounds.  Abdominal:     General: Abdomen is flat. Bowel sounds are normal.     Palpations: Abdomen is soft.     Tenderness: There is no abdominal tenderness.  Musculoskeletal:     Right lower leg:  No edema.     Left lower leg: No edema.     Comments: Tenderness to palpation over lower lateral and posterior left sided ribs.  No obvious deformity or ecchymosis.  No thoracic midline tenderness deformity or step-off.  Skin:    General: Skin is warm and dry.     Findings: No lesion.  Neurological:     General: No focal deficit present.     Mental Status: She is alert and oriented to person, place, and time. Mental status is at baseline.     ED Results / Procedures / Treatments   Labs (all labs ordered are listed, but only abnormal results are displayed) Labs Reviewed - No data to display  EKG None  Radiology DG Chest 2 View Result Date: 09/10/2023 CLINICAL DATA:  Left posterior rib pain after a fall 3 days ago. EXAM: CHEST - 2 VIEW COMPARISON:  Chest x-ray dated Dec 10, 2022. FINDINGS: The heart size and mediastinal contours are within normal limits. Normal pulmonary vascularity. No focal consolidation, pleural effusion, or pneumothorax. No acute osseous abnormality. IMPRESSION: No active cardiopulmonary disease. Electronically Signed   By: Elsie ONEIDA Shoulder M.D.   On: 09/10/2023 15:30    Procedures Procedures    Medications Ordered in ED Medications  oxyCODONE -acetaminophen  (PERCOCET/ROXICET) 5-325 MG per tablet 1 tablet (has no administration in time range)    ED Course/ Medical Decision Making/ A&P                                 Medical Decision Making Amount and/or Complexity of Data Reviewed Radiology: ordered.  Risk Prescription drug management.   [name] 68 y.o. presented today for fall. Working DDx that I considered at this time includes, but not limited to, intracranial hemorrhage, subdural/epidural hematoma, vertebral fracture, spinal cord injury, muscle strain, skull fracture, fracture, splenic injury, liver injury, perforated viscus, contusions.  R/o DDx: These diagnoses are less consistent than current impression due to findings on history of present  illness, physical exam, labs/imaging findings.  Review of prior external notes: 12/09/2022 visit   Pmhx: COPD, opioid dependence, chronic hypotension, noncompliance  Unique Tests and My Interpretation:  Not obtained  Imaging:  Chest x-ray without acute abnormality.  Initially had ordered CT chest however patient is requesting to be discharged after normal chest x-ray.  Problem List / ED Course / Critical interventions / Medication management  Although mentioned in triage note.  Patient denies any dizziness or lightheadedness.  Patient reports her fall was a result of losing her balance while pulling up her pants.  She primarily reports chest pain.  X-ray does not show any pneumothorax, rib fracture.  Had a discussion with patient but obtained a CT scan  as this is a more sensitive study to rule out rib fractures however patient reports that she cannot drive at night and is requesting to be discharged.  I will send her home with pain management strategies and return precautions.  Given reassuring chest x-ray I feel that CT scan would likely not change management at this time.  Patient understands and agrees to plan. I ordered medication including Norco  Reevaluation of the patient after these medicines showed that the patient improved Patients vitals assessed. Upon arrival patient is hemodynamically stable.  I have reviewed the patients home medicines and have made adjustments as needed    Consult: none  Plan:  F/u w/ PCP in 2-3d to ensure resolution of sx.  Patient was given return precautions. Patient stable for discharge at this time.  Patient educated on current sx/dx and verbalized understanding of plan. Return to ER w/ new or worsening sx.           Final Clinical Impression(s) / ED Diagnoses Final diagnoses:  Left-sided chest wall pain    Rx / DC Orders ED Discharge Orders     None         Shermon Warren SAILOR, PA-C 09/12/23 1204    Elnor Savant A, DO 09/13/23  [phone]

## 2023-09-10 NOTE — ED Triage Notes (Signed)
 Pt arrives via [facility] from home after having a fall on Wednesday evening. Pt was getting up from the toilet and felt dizzy, landing on the shower on her L side. Pt denies LOC. C/o L rib cage pain.

## 2023-09-10 NOTE — Progress Notes (Signed)
Refusing CT scan.

## 2023-09-10 NOTE — ED Notes (Addendum)
Pt refused chest CT

## 2023-09-27 ENCOUNTER — Encounter: Payer: Self-pay | Admitting: *Deleted

## 2023-10-13 ENCOUNTER — Telehealth: Payer: Self-pay

## 2023-10-13 DIAGNOSIS — I5032 Chronic diastolic (congestive) heart failure: Secondary | ICD-10-CM

## 2023-10-13 DIAGNOSIS — J449 Chronic obstructive pulmonary disease, unspecified: Secondary | ICD-10-CM

## 2024-02-10 ENCOUNTER — Emergency Department (HOSPITAL_COMMUNITY)

## 2024-02-10 ENCOUNTER — Encounter (HOSPITAL_COMMUNITY): Payer: Self-pay | Admitting: Emergency Medicine

## 2024-02-10 ENCOUNTER — Observation Stay (HOSPITAL_COMMUNITY)

## 2024-02-10 ENCOUNTER — Inpatient Hospital Stay (HOSPITAL_COMMUNITY)
Admission: EM | Admit: 2024-02-10 | Discharge: 2024-02-14 | DRG: 603 | Disposition: A | Discharge status: Home / Self Care | Attending: Internal Medicine | Admitting: Internal Medicine

## 2024-02-10 ENCOUNTER — Other Ambulatory Visit: Payer: Self-pay

## 2024-02-10 DIAGNOSIS — E039 Hypothyroidism, unspecified: Secondary | ICD-10-CM | POA: Diagnosis present

## 2024-02-10 DIAGNOSIS — J449 Chronic obstructive pulmonary disease, unspecified: Secondary | ICD-10-CM | POA: Diagnosis present

## 2024-02-10 DIAGNOSIS — A084 Viral intestinal infection, unspecified: Secondary | ICD-10-CM | POA: Diagnosis present

## 2024-02-10 DIAGNOSIS — R627 Adult failure to thrive: Secondary | ICD-10-CM | POA: Diagnosis present

## 2024-02-10 DIAGNOSIS — G894 Chronic pain syndrome: Secondary | ICD-10-CM | POA: Diagnosis present

## 2024-02-10 DIAGNOSIS — Z1152 Encounter for screening for COVID-19: Secondary | ICD-10-CM

## 2024-02-10 DIAGNOSIS — R531 Weakness: Secondary | ICD-10-CM

## 2024-02-10 DIAGNOSIS — F32A Depression, unspecified: Secondary | ICD-10-CM | POA: Diagnosis present

## 2024-02-10 DIAGNOSIS — N3000 Acute cystitis without hematuria: Principal | ICD-10-CM

## 2024-02-10 DIAGNOSIS — L03115 Cellulitis of right lower limb: Principal | ICD-10-CM | POA: Diagnosis present

## 2024-02-10 DIAGNOSIS — Z6835 Body mass index (BMI) 35.0-35.9, adult: Secondary | ICD-10-CM

## 2024-02-10 DIAGNOSIS — Z87891 Personal history of nicotine dependence: Secondary | ICD-10-CM

## 2024-02-10 DIAGNOSIS — I872 Venous insufficiency (chronic) (peripheral): Secondary | ICD-10-CM | POA: Diagnosis present

## 2024-02-10 DIAGNOSIS — E785 Hyperlipidemia, unspecified: Secondary | ICD-10-CM | POA: Diagnosis present

## 2024-02-10 DIAGNOSIS — F419 Anxiety disorder, unspecified: Secondary | ICD-10-CM | POA: Diagnosis present

## 2024-02-10 DIAGNOSIS — E876 Hypokalemia: Secondary | ICD-10-CM | POA: Diagnosis present

## 2024-02-10 DIAGNOSIS — M25552 Pain in left hip: Secondary | ICD-10-CM

## 2024-02-10 DIAGNOSIS — I5032 Chronic diastolic (congestive) heart failure: Secondary | ICD-10-CM | POA: Diagnosis present

## 2024-02-10 DIAGNOSIS — R197 Diarrhea, unspecified: Secondary | ICD-10-CM | POA: Diagnosis present

## 2024-02-10 DIAGNOSIS — J9610 Chronic respiratory failure, unspecified whether with hypoxia or hypercapnia: Secondary | ICD-10-CM | POA: Diagnosis present

## 2024-02-10 DIAGNOSIS — Z79890 Hormone replacement therapy (postmenopausal): Secondary | ICD-10-CM

## 2024-02-10 DIAGNOSIS — N390 Urinary tract infection, site not specified: Secondary | ICD-10-CM | POA: Diagnosis present

## 2024-02-10 DIAGNOSIS — I110 Hypertensive heart disease with heart failure: Secondary | ICD-10-CM | POA: Diagnosis present

## 2024-02-10 DIAGNOSIS — Z79899 Other long term (current) drug therapy: Secondary | ICD-10-CM

## 2024-02-10 DIAGNOSIS — K219 Gastro-esophageal reflux disease without esophagitis: Secondary | ICD-10-CM | POA: Diagnosis present

## 2024-02-10 DIAGNOSIS — E7800 Pure hypercholesterolemia, unspecified: Secondary | ICD-10-CM | POA: Diagnosis present

## 2024-02-10 LAB — COMPREHENSIVE METABOLIC PANEL WITH GFR
ALT: 10 U/L (ref 0–44)
AST: 13 U/L — ABNORMAL LOW (ref 15–41)
Albumin: 3.6 g/dL (ref 3.5–5.0)
Alkaline Phosphatase: 146 U/L — ABNORMAL HIGH (ref 38–126)
Anion gap: 10 (ref 5–15)
BUN: <5 mg/dL — ABNORMAL LOW (ref 8–23)
CO2: 26 mmol/L (ref 22–32)
Calcium: 9.2 mg/dL (ref 8.9–10.3)
Chloride: 105 mmol/L (ref 98–111)
Creatinine, Ser: 0.72 mg/dL (ref 0.44–1.00)
GFR, Estimated: >60 mL/min (ref >60)
Glucose, Bld: 86 mg/dL (ref 70–99)
Potassium: 3.1 mmol/L — ABNORMAL LOW (ref 3.5–5.1)
Sodium: 141 mmol/L (ref 135–145)
Total Bilirubin: 0.9 mg/dL (ref 0.0–1.2)
Total Protein: 7.2 g/dL (ref 6.5–8.1)

## 2024-02-10 LAB — URINALYSIS, ROUTINE W REFLEX MICROSCOPIC
Bilirubin Urine: NEGATIVE
Glucose, UA: NEGATIVE mg/dL
Ketones, ur: NEGATIVE mg/dL
Nitrite: POSITIVE — AB
Protein, ur: NEGATIVE mg/dL
Specific Gravity, Urine: 1.008 (ref 1.005–1.030)
pH: 6 (ref 5.0–8.0)

## 2024-02-10 LAB — MAGNESIUM: Magnesium: 2 mg/dL (ref 1.7–2.4)

## 2024-02-10 LAB — I-STAT CG4 LACTIC ACID, ED: Lactic Acid, Venous: 0.9 mmol/L (ref 0.5–1.9)

## 2024-02-10 LAB — BRAIN NATRIURETIC PEPTIDE: B Natriuretic Peptide: 153.7 pg/mL — ABNORMAL HIGH (ref 0.0–100.0)

## 2024-02-10 LAB — RESP PANEL BY RT-PCR (RSV, FLU A&B, COVID)  RVPGX2
Influenza A by PCR: NEGATIVE
Influenza B by PCR: NEGATIVE
Resp Syncytial Virus by PCR: NEGATIVE
SARS Coronavirus 2 by RT PCR: NEGATIVE

## 2024-02-10 LAB — VITAMIN B12: Vitamin B-12: 289 pg/mL (ref 180–914)

## 2024-02-10 LAB — CBC
HCT: 45 % (ref 36.0–46.0)
Hemoglobin: 14.1 g/dL (ref 12.0–15.0)
MCH: 30 pg (ref 26.0–34.0)
MCHC: 31.3 g/dL (ref 30.0–36.0)
MCV: 95.7 fL (ref 80.0–100.0)
Platelets: 323 K/uL (ref 150–400)
RBC: 4.7 MIL/uL (ref 3.87–5.11)
RDW: 16.3 % — ABNORMAL HIGH (ref 11.5–15.5)
WBC: 9 K/uL (ref 4.0–10.5)
nRBC: 0 % (ref 0.0–0.2)

## 2024-02-10 LAB — TSH: TSH: 4.715 u[IU]/mL — ABNORMAL HIGH (ref 0.350–4.500)

## 2024-02-10 LAB — LIPASE, BLOOD: Lipase: 20 U/L (ref 11–51)

## 2024-02-10 MED ORDER — IOHEXOL 300 MG/ML  SOLN
100.0000 mL | Freq: Once | INTRAMUSCULAR | Status: AC | PRN
  Administered 2024-02-10: 100 mL via INTRAVENOUS

## 2024-02-10 MED ORDER — POTASSIUM CHLORIDE CRYS ER 20 MEQ PO TBCR
40.0000 meq | EXTENDED_RELEASE_TABLET | Freq: Once | ORAL | Status: AC
  Administered 2024-02-10: 40 meq via ORAL
  Filled 2024-02-10: qty 4

## 2024-02-10 MED ORDER — MORPHINE SULFATE (PF) 4 MG/ML IV SOLN
4.0000 mg | Freq: Once | INTRAVENOUS | Status: AC
  Administered 2024-02-10: 4 mg via INTRAVENOUS
  Filled 2024-02-10: qty 1

## 2024-02-10 MED ORDER — SODIUM CHLORIDE 0.9 % IV SOLN: INTRAVENOUS | Status: AC

## 2024-02-10 MED ORDER — ACETAMINOPHEN 650 MG RE SUPP: 650.0000 mg | Freq: Four times a day (QID) | RECTAL | Status: DC | PRN

## 2024-02-10 MED ORDER — ONDANSETRON HCL 4 MG PO TABS
4.0000 mg | ORAL_TABLET | Freq: Four times a day (QID) | ORAL | Status: DC | PRN
  Administered 2024-02-13 (×2): 4 mg via ORAL
  Filled 2024-02-10 (×2): qty 1

## 2024-02-10 MED ORDER — SODIUM CHLORIDE 0.9 % IV BOLUS
500.0000 mL | Freq: Once | INTRAVENOUS | Status: AC
  Administered 2024-02-10: 500 mL via INTRAVENOUS

## 2024-02-10 MED ORDER — PANTOPRAZOLE SODIUM 40 MG PO TBEC
40.0000 mg | DELAYED_RELEASE_TABLET | Freq: Every day | ORAL | Status: DC
  Administered 2024-02-11 – 2024-02-14 (×4): 40 mg via ORAL
  Filled 2024-02-10 (×4): qty 1

## 2024-02-10 MED ORDER — SODIUM CHLORIDE 0.9 % IV SOLN: INTRAVENOUS | Status: DC

## 2024-02-10 MED ORDER — ONDANSETRON HCL 4 MG/2ML IJ SOLN
4.0000 mg | Freq: Once | INTRAMUSCULAR | Status: AC
  Administered 2024-02-10: 4 mg via INTRAVENOUS
  Filled 2024-02-10: qty 2

## 2024-02-10 MED ORDER — OXYCODONE-ACETAMINOPHEN 5-325 MG PO TABS
1.0000 | ORAL_TABLET | Freq: Two times a day (BID) | ORAL | Status: DC
  Administered 2024-02-10 – 2024-02-14 (×8): 1 via ORAL
  Filled 2024-02-10 (×8): qty 1

## 2024-02-10 MED ORDER — ONDANSETRON HCL 4 MG/2ML IJ SOLN
4.0000 mg | Freq: Four times a day (QID) | INTRAMUSCULAR | Status: DC | PRN
Start: 2024-02-10 — End: 2024-02-14
  Administered 2024-02-11 – 2024-02-13 (×5): 4 mg via INTRAVENOUS
  Filled 2024-02-10 (×6): qty 2

## 2024-02-10 MED ORDER — ARIPIPRAZOLE 10 MG PO TABS
10.0000 mg | ORAL_TABLET | Freq: Every day | ORAL | Status: DC
  Administered 2024-02-11 – 2024-02-14 (×4): 10 mg via ORAL
  Filled 2024-02-10 (×4): qty 1

## 2024-02-10 MED ORDER — ENOXAPARIN SODIUM 40 MG/0.4ML IJ SOSY
40.0000 mg | PREFILLED_SYRINGE | INTRAMUSCULAR | Status: DC
  Administered 2024-02-11 – 2024-02-13 (×3): 40 mg via SUBCUTANEOUS
  Filled 2024-02-10 (×3): qty 0.4

## 2024-02-10 MED ORDER — BUPROPION HCL ER (XL) 150 MG PO TB24
150.0000 mg | ORAL_TABLET | Freq: Every day | ORAL | Status: DC
  Administered 2024-02-11 – 2024-02-14 (×4): 150 mg via ORAL
  Filled 2024-02-10 (×4): qty 1

## 2024-02-10 MED ORDER — ALPRAZOLAM 0.5 MG PO TABS
1.0000 mg | ORAL_TABLET | Freq: Three times a day (TID) | ORAL | Status: DC | PRN
  Administered 2024-02-11 – 2024-02-13 (×3): 1 mg via ORAL
  Filled 2024-02-10 (×3): qty 2

## 2024-02-10 MED ORDER — GABAPENTIN 300 MG PO CAPS
300.0000 mg | ORAL_CAPSULE | Freq: Two times a day (BID) | ORAL | Status: DC
  Administered 2024-02-10 – 2024-02-14 (×8): 300 mg via ORAL
  Filled 2024-02-10 (×8): qty 1

## 2024-02-10 MED ORDER — VENLAFAXINE HCL ER 150 MG PO CP24
150.0000 mg | ORAL_CAPSULE | Freq: Every day | ORAL | Status: DC
  Administered 2024-02-10 – 2024-02-13 (×4): 150 mg via ORAL
  Filled 2024-02-10 (×4): qty 1

## 2024-02-10 MED ORDER — SODIUM CHLORIDE 0.9 % IV SOLN
1.0000 g | Freq: Once | INTRAVENOUS | Status: AC
  Administered 2024-02-10: 1 g via INTRAVENOUS
  Filled 2024-02-10: qty 10

## 2024-02-10 MED ORDER — IOHEXOL 300 MG/ML  SOLN: 100.0000 mL | Freq: Once | INTRAMUSCULAR | Status: DC | PRN

## 2024-02-10 MED ORDER — SODIUM CHLORIDE 0.9 % IV SOLN
1.0000 g | INTRAVENOUS | Status: DC
  Administered 2024-02-11 – 2024-02-13 (×3): 1 g via INTRAVENOUS
  Filled 2024-02-10 (×3): qty 10

## 2024-02-10 MED ORDER — BUDESON-GLYCOPYRROL-FORMOTEROL 160-9-4.8 MCG/ACT IN AERO
2.0000 | INHALATION_SPRAY | Freq: Two times a day (BID) | RESPIRATORY_TRACT | Status: DC
  Administered 2024-02-11 – 2024-02-14 (×7): 2 via RESPIRATORY_TRACT
  Filled 2024-02-10: qty 5.9

## 2024-02-10 MED ORDER — ATORVASTATIN CALCIUM 10 MG PO TABS
10.0000 mg | ORAL_TABLET | Freq: Every day | ORAL | Status: DC
  Administered 2024-02-11 – 2024-02-14 (×4): 10 mg via ORAL
  Filled 2024-02-10 (×4): qty 1

## 2024-02-10 MED ORDER — LEVOTHYROXINE SODIUM 75 MCG PO TABS
150.0000 ug | ORAL_TABLET | Freq: Every day | ORAL | Status: DC
  Administered 2024-02-11 – 2024-02-14 (×4): 150 ug via ORAL
  Filled 2024-02-10 (×6): qty 2

## 2024-02-10 MED ORDER — ACETAMINOPHEN 325 MG PO TABS
650.0000 mg | ORAL_TABLET | Freq: Four times a day (QID) | ORAL | Status: DC | PRN
Start: 2024-02-10 — End: 2024-02-14
  Administered 2024-02-11 – 2024-02-14 (×5): 650 mg via ORAL
  Filled 2024-02-10 (×5): qty 2

## 2024-02-10 NOTE — Assessment & Plan Note (Signed)
 Continue home percocet 5mg  BID Gabapentin  300mg  BID  Effexor  150mg  PMP website reviewed and verified

## 2024-02-10 NOTE — Assessment & Plan Note (Signed)
-  euvolemic to dry -echo 2023 with normal EF and grade 1 DD. Mildly elevated pulmonary artery pressure  -strict I/O

## 2024-02-10 NOTE — Assessment & Plan Note (Signed)
 Continue protonix

## 2024-02-10 NOTE — ED Provider Notes (Signed)
 [place] EMERGENCY DEPARTMENT AT [facility] Provider Note   CSN: [id number] Arrival date & time: 02/10/24  1551    Patient presents with: Abdominal Pain   [name] is a 68 y.o. female here for eval ration of abdominal pain, diarrhea over the last 3 days.  She feels weak and tired.  She uses a walker however states she feels so fatigued she is having difficulty getting around.  She has chronic cough but denies any shortness of breath or chest pain.  She is some chronic lower extremity swelling right greater than left according to patient.  Some associated dysuria and urinary urgency.  States she is having to wear a diaper due to multiple episodes of loose stool and urinary frequency.  She denies any recent travel or antibiotics.  Admits to subjective fever however has not taken her temperature at home.  No sick contacts.  She denies any blood in her stool.   HPI     Prior to Admission medications   Medication Sig Start Date End Date Taking? Authorizing Provider  ALPRAZolam  (XANAX ) 1 MG tablet Take 1 mg by mouth 3 (three) times daily as needed for anxiety.   Yes [provider]  ARIPiprazole  (ABILIFY ) 10 MG tablet Take 10 mg by mouth daily. 10/21/22  Yes [provider]  atorvastatin  (LIPITOR) 10 MG tablet Take 10 mg by mouth daily.   Yes [provider]  Budeson-Glycopyrrol-Formoterol  (BREZTRI  AEROSPHERE) 160-9-4.8 MCG/ACT AERO Inhale 2 puffs into the lungs in the morning and at bedtime. 03/14/23  Yes Parrett, Tammy S, NP  buPROPion  (WELLBUTRIN  XL) 150 MG 24 hr tablet Take 150 mg by mouth daily.   Yes [provider]  cetirizine (ZYRTEC) 10 MG tablet Take 10 mg by mouth at bedtime. 08/07/22  Yes [provider]  gabapentin  (NEURONTIN ) 300 MG capsule Take 300 mg by mouth 2 (two) times daily.   Yes [provider]  ipratropium-albuterol  (DUONEB) 0.5-2.5 (3) MG/3ML SOLN use 1 vial by nebulization every 4 (four) hours as  needed. Patient taking differently: Take 3 mLs by nebulization every 4 (four) hours as needed (wheezing). 03/06/21  Yes Briana Elgin LABOR, MD  levothyroxine  (SYNTHROID ) 150 MCG tablet Take 150 mcg by mouth daily before breakfast.   Yes [provider]  oxyCODONE -acetaminophen  (PERCOCET/ROXICET) 5-325 MG tablet Take 1 tablet by mouth every 6 (six) hours as needed for severe pain (pain score 7-10). Patient taking differently: Take 1 tablet by mouth 2 (two) times daily. 09/10/23  Yes Barrett, Warren SAILOR, PA-C  pantoprazole  (PROTONIX ) 40 MG tablet Take 40 mg by mouth daily. 01/23/24  Yes [provider]  venlafaxine  XR (EFFEXOR -XR) 150 MG 24 hr capsule Take 150 mg by mouth at bedtime.   Yes [provider]  esomeprazole (NEXIUM) 40 MG capsule Take 40 mg by mouth daily. Patient not taking: Reported on 02/10/2024 10/20/22   [provider]  hydrocortisone  cream 0.5 % Apply topically 3 (three) times daily as needed for itching. Patient not taking: Reported on 02/10/2024 09/15/21   Pokhrel, Vernal, MD  lidocaine  (LIDODERM ) 5 % Place 1 patch onto the skin daily. Remove & Discard patch within 12 hours or as directed by MD 09/10/23   Barrett, Jamie N, PA-C    Allergies: Gemfibrozil, Nitroglycerin, Other, Pravastatin, Sulfamethoxazole-trimethoprim, and Tyloxapol    Review of Systems  Constitutional:  Positive for fatigue and fever (subjective).  HENT: Negative.    Respiratory:  Positive for cough (chronic).   Cardiovascular: Negative.  Gastrointestinal:  Positive for abdominal pain, diarrhea and nausea. Negative for abdominal distention, blood in stool, constipation and vomiting.  Genitourinary:  Positive for dysuria, frequency and urgency. Negative for flank pain and hematuria.  Musculoskeletal: Negative.   Neurological:  Positive for weakness (generalized). Negative for dizziness, seizures, syncope, speech difficulty, light-headedness, numbness and headaches.  All other systems  reviewed and are negative.  Updated Vital Signs BP (!) 110/90   Pulse 70   Temp 98.1 F (36.7 C) (Oral)   Resp (!) 21   SpO2 96%   Physical Exam Vitals and nursing note reviewed.  Constitutional:      General: She is not in acute distress.    Appearance: She is well-developed. She is ill-appearing (chronically ill appearing). She is not toxic-appearing or diaphoretic.  HENT:     Head: Normocephalic and atraumatic.  Eyes:     Pupils: Pupils are equal, round, and reactive to light.  Cardiovascular:     Rate and Rhythm: Normal rate.     Pulses: Normal pulses.          Radial pulses are 2+ on the right side and 2+ on the left side.       Dorsalis pedis pulses are 2+ on the right side and 2+ on the left side.     Heart sounds: Normal heart sounds.  Pulmonary:     Effort: Pulmonary effort is normal. No respiratory distress.     Breath sounds: Normal breath sounds.     Comments: Clear bilaterally, speaks in full sentences without difficulty Abdominal:     General: Bowel sounds are normal. There is no distension.     Palpations: Abdomen is soft.     Tenderness: There is abdominal tenderness in the periumbilical area and suprapubic area. There is no right CVA tenderness, left CVA tenderness, guarding or rebound.     Comments: Sacral pressure wounds  Musculoskeletal:        General: Normal range of motion.     Cervical back: Normal range of motion.     Comments: Pressure wounds  Skin:    General: Skin is warm and dry.     Findings: Rash present.     Comments: Right lower extremity erythema, warmth diffusely right calf.  Stasis skin changes bilaterally  Neurological:     General: No focal deficit present.     Mental Status: She is alert.  Psychiatric:        Mood and Affect: Mood normal.     (all labs ordered are listed, but only abnormal results are displayed) Labs Reviewed  COMPREHENSIVE METABOLIC PANEL WITH GFR - Abnormal; Notable for the following components:      Result  Value   Potassium 3.1 (*)    BUN <5 (*)    AST 13 (*)    Alkaline Phosphatase 146 (*)    All other components within normal limits  CBC - Abnormal; Notable for the following components:   RDW 16.3 (*)    All other components within normal limits  URINALYSIS, ROUTINE W REFLEX MICROSCOPIC - Abnormal; Notable for the following components:   APPearance HAZY (*)    Hgb urine dipstick SMALL (*)    Nitrite POSITIVE (*)    Leukocytes,Ua MODERATE (*)    Bacteria, UA RARE (*)    All other components within normal limits  BRAIN NATRIURETIC PEPTIDE - Abnormal; Notable for the following components:   B Natriuretic Peptide 153.7 (*)    All other components within normal  limits  RESP PANEL BY RT-PCR (RSV, FLU A&B, COVID)  [id number]  GASTROINTESTINAL PANEL BY PCR, STOOL (REPLACES STOOL CULTURE)  C DIFFICILE QUICK SCREEN W PCR REFLEX    CULTURE, BLOOD (ROUTINE X 2)  CULTURE, BLOOD (ROUTINE X 2)  URINE CULTURE  LIPASE, BLOOD  MAGNESIUM   TSH  VITAMIN B12  I-STAT CG4 LACTIC ACID, ED    EKG: EKG Interpretation Date/Time:  Friday February 10 2024 17:40:43 EDT Ventricular Rate:  69 PR Interval:  141 QRS Duration:  102 QT Interval:  441 QTC Calculation: 473 R Axis:   103  Text Interpretation: Sinus rhythm Atrial premature complex Right axis deviation Confirmed by Cottie Cough [phone]) on 02/10/2024 7:22:15 PM  Radiology: CT ABDOMEN PELVIS W CONTRAST Result Date: 02/10/2024 CLINICAL DATA:  Left lower quadrant pain EXAM: CT ABDOMEN AND PELVIS WITH CONTRAST TECHNIQUE: Multidetector CT imaging of the abdomen and pelvis was performed using the standard protocol following bolus administration of intravenous contrast. RADIATION DOSE REDUCTION: This exam was performed according to the departmental dose-optimization program which includes automated exposure control, adjustment of the mA and/or kV according to patient size and/or use of iterative reconstruction technique. CONTRAST:  OMNIPAQUE  IOHEXOL  300  MG/ML  SOLN COMPARISON:  CT abdomen and pelvis 12/06/2022 FINDINGS: Lower chest: No acute abnormality. Hepatobiliary: No focal liver abnormality is seen. Status post cholecystectomy. No biliary dilatation. Pancreas: Unremarkable. No pancreatic ductal dilatation or surrounding inflammatory changes. Spleen: Normal in size without focal abnormality. Adrenals/Urinary Tract: Adrenal glands are unremarkable. Kidneys are normal, without renal calculi, focal lesion, or hydronephrosis. Bladder is unremarkable. Stomach/Bowel: Stomach is within normal limits. Appendix appears normal. No evidence of bowel wall thickening, distention, or inflammatory changes. Vascular/Lymphatic: Aortic atherosclerosis. No enlarged abdominal or pelvic lymph nodes. Reproductive: Status post hysterectomy. No adnexal masses. Other: No abdominal wall hernia or abnormality. No abdominopelvic ascites. Musculoskeletal: Advanced degenerative changes of the spine persists there severe degenerative changes of the left hip with bone-on-bone configuration, progressed compared to the prior study. Left hip joint effusion again seen IMPRESSION: 1. No acute localizing process in the abdomen or pelvis. 2. Increased severe degenerative changes of the left hip. Unchanged left hip joint effusion. Aortic Atherosclerosis (ICD10-I70.0). Electronically Signed   By: Greig Pique M.D.   On: 02/10/2024 20:28   DG Chest 2 View Result Date: 02/10/2024 CLINICAL DATA:  Cough. EXAM: CHEST - 2 VIEW COMPARISON:  September 10, 2023. FINDINGS: The heart size and mediastinal contours are within normal limits. Both lungs are clear. The visualized skeletal structures are unremarkable. IMPRESSION: No active cardiopulmonary disease. Electronically Signed   By: Lynwood Landy Raddle M.D.   On: 02/10/2024 18:18     Procedures   Medications Ordered in the ED  ondansetron  (ZOFRAN ) injection 4 mg (4 mg Intravenous Given 02/10/24 1731)  morphine  (PF) 4 MG/ML injection 4 mg (4 mg  Intravenous Given 02/10/24 1730)  sodium chloride  0.9 % bolus 500 mL (0 mLs Intravenous Stopped 02/10/24 1948)  cefTRIAXone  (ROCEPHIN ) 1 g in sodium chloride  0.9 % 100 mL IVPB (0 g Intravenous Stopped 02/10/24 1936)  potassium chloride  SA (KLOR-CON  M) CR tablet 40 mEq (40 mEq Oral Given 02/10/24 1908)  iohexol  (OMNIPAQUE ) 300 MG/ML solution 100 mL (100 mLs Intravenous Contrast Given 02/10/24 2007)  ondansetron  (ZOFRAN ) injection 4 mg (4 mg Intravenous Given 02/10/24 2144)  morphine  (PF) 4 MG/ML injection 4 mg (4 mg Intravenous Given 02/10/24 2319)   68 year old here for evaluation of feeling unwell.  3 days of abdominal pain, loose stool  and nausea.  No bloody stool.  No recent antibiotics or travel.  Objective fever at home however is not measured her temperature.  She is afebrile, nonseptic, chronically ill-appearing.  Her heart and lungs are clear.  Her abdomen is diffusely tender to her lower abdomen.  Admits to some dysuria and frequency.  Her right lower extremity is erythematous, warm, greater than left.  States she has had some chronic issues with her lower extremities, right lower extremity slightly redder and warmer than baseline, I question possible cellulitis.  Ultrasound not currently available to r/o DVT.  Labs and imaging personally viewed and interpreted:  UA positive for UTI--- prior urine culture positive for Proteus-pansensitive except Macrobid CBC without leukocytosis Metabolic panel potassium 3.1, alk phos 146 Lipase 20 BNP 153 Chest x-ray EKG without ischemic changes  Reassessed.  Still persistently nauseous, requesting pain medicine.  He feels too weak to ambulate.  Reassuring CT abdomen pelvis.  She does appear to be developing a cellulitis to her right lower extremity as well as has urinary tract infection which is likely the cause of her abdominal pain, nausea.  Will admit for weakness, UTI, cellulitis  Discussed with Dr. Waddell who is agreeable to assess patient for  admission  The patient appears reasonably stabilized for admission considering the current resources, flow, and capabilities available in the ED at this time, and I doubt any other [facility] requiring further screening and/or treatment in the ED prior to admission.    Clinical Course as of 02/10/24 2220  Fri Feb 10, 2024  2207 Dr. Waddell with medicine for admission [BH]    Clinical Course User Index [BH] Juliza Machnik A, PA-C                                 Medical Decision Making Amount and/or Complexity of Data Reviewed Labs: ordered. Radiology: ordered.  Risk Prescription drug management. Decision regarding hospitalization.        Final diagnoses:  Acute cystitis without hematuria  Weakness    ED Discharge Orders     None          Chavis Tessler A, PA-C 02/10/24 2220    Cottie Donnice PARAS, MD 02/10/24 2224

## 2024-02-10 NOTE — H&P (Signed)
 History and Physical    Patient: [name] FMW:[id number] DOB: 1956-03-05 DOA: 02/10/2024 DOS: the patient was seen and examined on 02/10/2024 PCP: Shayne Anes, MD  Patient coming from: Home - lives alone. Ambulates with walker.    Chief Complaint: abdominal pain and weakness   HPI: [name] is a 68 y.o. female with medical history significant of COPD with chronic respiratory failure (wears 2L PRN), gout, HLD, HTN, hypothyroidism, chronic pain syndrome with impaired mobility, who presented to ED with complaints of abdominal pain and vomiting and diarrhea over the last 3 days. She has had chills and sweats and subjective fever. She states she also has had a headache. She states she has had vomiting maybe 4-5x/day and has had diarrhea all day long. She also has increased urinary frequency, urgency and dysuria. She is unsure if her urine is a different color. It has a smell to it. Her abdominal is generalized. Rated as a 7/10 and described as sharp. Nothing makes it better or worse. She also feels weak and fatigued. She denies any falls.    Denies any vision changes, +headaches, no chest pain or palpitations, shortness of breath or cough.   She does not smoke or drink alcohol.   ER Course:  vitals: afebrile, bp: 136/74, HR: 77, RR: 20, oxygen: 96%RA Pertinent labs: potassium: 3.1,  CXR: no acute findings CT abdomen/pelvis:  No acute localizing process in the abdomen or pelvis. 2. Increased severe degenerative changes of the left hip. Unchanged left hip joint effusion. In ED: given 500cc IVF bolus, potassium, zofran , morphine  and rocephin . BC obtained. TRH asked to admit.     Review of Systems: As mentioned in the history of present illness. All other systems reviewed and are negative. Past Medical History:  Diagnosis Date   COPD (chronic obstructive pulmonary disease) (HCC)    Gout    Hypercholesteremia    Sepsis (HCC)    Past Surgical History:  Procedure Laterality Date    ABDOMINAL HYSTERECTOMY     ROTATOR CUFF REPAIR     Social History:  reports that she quit smoking about 22 months ago. Her smoking use included cigarettes. She started smoking about 51 years ago. She has a 50 pack-year smoking history. She has never used smokeless tobacco. She reports that she does not drink alcohol and does not use drugs.  Allergies  Allergen Reactions   Gemfibrozil Other (See Comments)    Sick to stomach   Nitroglycerin Other (See Comments)     Cough, Irritation   Other Other (See Comments)    Grape fruit juice - unknown   Pravastatin Other (See Comments)    Dizzy   Sulfamethoxazole-Trimethoprim Nausea And Vomiting   Tyloxapol Other (See Comments)     Does not work    History reviewed. No pertinent family history.  Prior to Admission medications   Medication Sig Start Date End Date Taking? Authorizing Provider  ALPRAZolam  (XANAX ) 1 MG tablet Take 1 mg by mouth 3 (three) times daily as needed for anxiety.    [provider]  ARIPiprazole  (ABILIFY ) 10 MG tablet Take 10 mg by mouth daily. 10/21/22   [provider]  atorvastatin  (LIPITOR) 10 MG tablet Take 10 mg by mouth daily.    [provider]  Budeson-Glycopyrrol-Formoterol  (BREZTRI  AEROSPHERE) 160-9-4.8 MCG/ACT AERO Inhale 2 puffs into the lungs in the morning and at bedtime. 03/14/23   Parrett, Madelin RAMAN, NP  buPROPion  (WELLBUTRIN  XL) 150 MG 24 hr tablet Take 150 mg  by mouth every evening.     [provider]  cetirizine (ZYRTEC) 10 MG tablet Take 10 mg by mouth at bedtime. 08/07/22   [provider]  esomeprazole (NEXIUM) 40 MG capsule Take 40 mg by mouth daily. 10/20/22   [provider]  gabapentin  (NEURONTIN ) 300 MG capsule Take 300 mg by mouth 3 (three) times daily.    [provider]  hydrocortisone  cream 0.5 % Apply topically 3 (three) times daily as needed for itching. 09/15/21   Pokhrel, Laxman, MD  ipratropium-albuterol  (DUONEB) 0.5-2.5 (3)  MG/3ML SOLN use 1 vial by nebulization every 4 (four) hours as needed. Patient taking differently: Take 3 mLs by nebulization every 4 (four) hours as needed (wheezing). 03/06/21   Briana Elgin LABOR, MD  levothyroxine  (SYNTHROID ) 150 MCG tablet Take 150 mcg by mouth daily before breakfast.    [provider]  lidocaine  (LIDODERM ) 5 % Place 1 patch onto the skin daily. Remove & Discard patch within 12 hours or as directed by MD 09/10/23   Barrett, Jamie N, PA-C  oxyCODONE -acetaminophen  (PERCOCET/ROXICET) 5-325 MG tablet Take 1 tablet by mouth every 6 (six) hours as needed for severe pain (pain score 7-10). 09/10/23   Barrett, Jamie N, PA-C  venlafaxine  XR (EFFEXOR -XR) 150 MG 24 hr capsule Take 150 mg by mouth at bedtime.    [provider]    Physical Exam: Vitals:   02/10/24 2120 02/10/24 2124 02/10/24 2130 02/10/24 2139  BP: 96/78  (!) 110/90   Pulse: 88  76 70  Resp: 16  (!) 21 (!) 21  Temp:  98.1 F (36.7 C)    TempSrc: Oral Oral    SpO2: 94%  92% 96%   General:  Appears calm and comfortable and is in NAD Eyes:  PERRL, EOMI, normal lids, iris ENT:  HOH, lips & tongue, mmm; edentulous  Neck:  no LAD, masses or thyromegaly; no carotid bruits Cardiovascular:  RRR, no m/r/g. BLE edema/venous stasis changes, R>L edema. Erythema R>L  Respiratory:   CTA bilaterally with no wheezes/rales/rhonchi.  Normal respiratory effort. Abdomen:  soft, NT, ND, NABS Back:   normal alignment, no CVAT Skin:  no rash or induration seen on limited exam Musculoskeletal:  grossly normal tone BUE/BLE, good ROM, She has TTP over left greater trochanteric process Lower extremity:  BLE edema R>L with erythema  Limited foot exam with no ulcerations.  2+ distal pulses. Psychiatric:  grossly normal mood and affect, speech fluent and appropriate, AOx3 Neurologic:  CN 2-12 grossly intact, moves all extremities in coordinated fashion, sensation intact   Radiological Exams on Admission: Independently  reviewed - see discussion in A/P where applicable  CT ABDOMEN PELVIS W CONTRAST Result Date: 02/10/2024 CLINICAL DATA:  Left lower quadrant pain EXAM: CT ABDOMEN AND PELVIS WITH CONTRAST TECHNIQUE: Multidetector CT imaging of the abdomen and pelvis was performed using the standard protocol following bolus administration of intravenous contrast. RADIATION DOSE REDUCTION: This exam was performed according to the departmental dose-optimization program which includes automated exposure control, adjustment of the mA and/or kV according to patient size and/or use of iterative reconstruction technique. CONTRAST:  OMNIPAQUE  IOHEXOL  300 MG/ML  SOLN COMPARISON:  CT abdomen and pelvis 12/06/2022 FINDINGS: Lower chest: No acute abnormality. Hepatobiliary: No focal liver abnormality is seen. Status post cholecystectomy. No biliary dilatation. Pancreas: Unremarkable. No pancreatic ductal dilatation or surrounding inflammatory changes. Spleen: Normal in size without focal abnormality. Adrenals/Urinary Tract: Adrenal glands are unremarkable. Kidneys are normal, without renal calculi, focal lesion,  or hydronephrosis. Bladder is unremarkable. Stomach/Bowel: Stomach is within normal limits. Appendix appears normal. No evidence of bowel wall thickening, distention, or inflammatory changes. Vascular/Lymphatic: Aortic atherosclerosis. No enlarged abdominal or pelvic lymph nodes. Reproductive: Status post hysterectomy. No adnexal masses. Other: No abdominal wall hernia or abnormality. No abdominopelvic ascites. Musculoskeletal: Advanced degenerative changes of the spine persists there severe degenerative changes of the left hip with bone-on-bone configuration, progressed compared to the prior study. Left hip joint effusion again seen IMPRESSION: 1. No acute localizing process in the abdomen or pelvis. 2. Increased severe degenerative changes of the left hip. Unchanged left hip joint effusion. Aortic Atherosclerosis (ICD10-I70.0).  Electronically Signed   By: Greig Pique M.D.   On: 02/10/2024 20:28   DG Chest 2 View Result Date: 02/10/2024 CLINICAL DATA:  Cough. EXAM: CHEST - 2 VIEW COMPARISON:  September 10, 2023. FINDINGS: The heart size and mediastinal contours are within normal limits. Both lungs are clear. The visualized skeletal structures are unremarkable. IMPRESSION: No active cardiopulmonary disease. Electronically Signed   By: Lynwood Landy Raddle M.D.   On: 02/10/2024 18:18    EKG: Independently reviewed.  NSR with rate 69; nonspecific ST changes with no evidence of acute ischemia. PAC    Labs on Admission: I have personally reviewed the available labs and imaging studies at the time of the admission.  Pertinent labs:   Potassium: 3.1  Assessment and Plan: Principal Problem:   Failure to thrive in adult Active Problems:   Diarrhea and vomiting   UTI (urinary tract infection)   Venous stasis dermatitis of both lower extremities/+/- cellulitis fo RLL   Left hip pain   Hypokalemia   COPD witih chronic respiratory failure   Chronic diastolic CHF (congestive heart failure) (HCC)   Acquired hypothyroidism   Chronic pain syndrome   Anxiety and depression   Hyperlipidemia   GERD (gastroesophageal reflux disease)    Assessment and Plan: * Failure to thrive in adult 68 year old female presenting for 3 day history of N/V/D and abdominal pain with worsening weakness and fatigue and poor PO Intake found to have UTI and likely viral gastroenteritis  -obs to tele  -continue gentle IVF  -continue rocephin  for UTI/+ or - cellulitis of RLE  -CT abdomen/pelvis with no acute findings and no SIRS concerning labs for infectious cause -C.diff and GI PCR pending, enteric precautions and supportive therapy  -check TSH/B12  -check left hip xray with pain and weakness  -PT/OT consult   Diarrhea and vomiting She has history of diarrhea in her chart. Unsure what is her baseline CT abdomen/pelvis with no acute  findings C.Diff and GI panel pending  Enteric precautions  No episodes of diarrhea/vomiting in ED Zofran , IVF supportive treatment   UTI (urinary tract infection) Hx of diarrhea x 3 days with dysuria, frequency and urgency UA with nitrite, LE, culture pending Continue rocephin    Venous stasis dermatitis of both lower extremities/+/- cellulitis fo RLL Known BLE venous stasis  She thinks her RLE is more edematous and erythematous R>L edema, check DVT study  Continue rocephin    Left hip pain -point TTP over left hip, ? Bursitis -pain started a few weeks ago and feels pulling like with radiation into groin  -check hip xray   Hypokalemia Check magnesium  Repleted in ED Trend   COPD witih chronic respiratory failure -she wears 2L [place] PRN -stable, no signs/symptoms of excerbation  -continue home inhalers.  -oxygen PRN   Chronic diastolic CHF (congestive heart failure) (HCC) -  euvolemic to dry -echo 2023 with normal EF and grade 1 DD. Mildly elevated pulmonary artery pressure  -strict I/O   Acquired hypothyroidism Check TSH with FTT Continue synthroid  150mcg daily   Chronic pain syndrome Continue home percocet 5mg  BID Gabapentin  300mg  BID  Effexor  150mg  PMP website reviewed and verified   Anxiety and depression Continue wellbutrin , effexor  and xanax  Prn   Hyperlipidemia Continue lipitor   GERD (gastroesophageal reflux disease) Continue protonix      Advance Care Planning:   Code Status: Full Code   Consults: PT/OT   DVT Prophylaxis: lovenox    Family Communication: none   Severity of Illness: The appropriate patient status for this patient is OBSERVATION. Observation status is judged to be reasonable and necessary in order to provide the required intensity of service to ensure the patient's safety. The patient's presenting symptoms, physical exam findings, and initial radiographic and laboratory data in the context of their medical condition is felt to place  them at decreased risk for further clinical deterioration. Furthermore, it is anticipated that the patient will be medically stable for discharge from the hospital within 2 midnights of admission.   Author: Isaiah Geralds, MD 02/10/2024 10:53 PM  For on call review [email or web address].

## 2024-02-10 NOTE — Assessment & Plan Note (Signed)
 Continue wellbutrin , effexor  and xanax  Prn

## 2024-02-10 NOTE — Assessment & Plan Note (Signed)
 68 year old female presenting for 3 day history of N/V/D and abdominal pain with worsening weakness and fatigue and poor PO Intake found to have UTI and likely viral gastroenteritis  -obs to tele  -continue gentle IVF  -continue rocephin  for UTI/+ or - cellulitis of RLE  -CT abdomen/pelvis with no acute findings and no SIRS concerning labs for infectious cause -C.diff and GI PCR pending, enteric precautions and supportive therapy  -check TSH/B12  -check left hip xray with pain and weakness  -PT/OT consult

## 2024-02-10 NOTE — Assessment & Plan Note (Signed)
-  she wears 2L [place] PRN -stable, no signs/symptoms of excerbation  -continue home inhalers.  -oxygen PRN

## 2024-02-10 NOTE — Assessment & Plan Note (Signed)
 Check TSH with FTT Continue synthroid  150mcg daily

## 2024-02-10 NOTE — Assessment & Plan Note (Signed)
Check magnesium Repleted in ED Trend

## 2024-02-10 NOTE — Assessment & Plan Note (Signed)
 Known BLE venous stasis  She thinks her RLE is more edematous and erythematous R>L edema, check DVT study  Continue rocephin 

## 2024-02-10 NOTE — Assessment & Plan Note (Addendum)
 She has history of diarrhea in her chart. Unsure what is her baseline CT abdomen/pelvis with no acute findings C.Diff and GI panel pending  Enteric precautions  No episodes of diarrhea/vomiting in ED Zofran , IVF supportive treatment

## 2024-02-10 NOTE — Assessment & Plan Note (Addendum)
-  point TTP over left hip, ? Bursitis -pain started a few weeks ago and feels pulling like with radiation into groin  -check hip xray

## 2024-02-10 NOTE — Assessment & Plan Note (Signed)
 Continue lipitor  ?

## 2024-02-10 NOTE — ED Triage Notes (Signed)
 BIB EMS from home.  Experiencing generalized abdominal pain and diarrhea x 3 days with no appetite.  Weak and tired.  BP 136/72, HR 86, 97% on RA.  CBG 101  Ambulatory with assistance.  Has her walker with her.

## 2024-02-10 NOTE — Assessment & Plan Note (Signed)
 Hx of diarrhea x 3 days with dysuria, frequency and urgency UA with nitrite, LE, culture pending Continue rocephin 

## 2024-02-11 ENCOUNTER — Observation Stay (HOSPITAL_COMMUNITY)

## 2024-02-11 DIAGNOSIS — I110 Hypertensive heart disease with heart failure: Secondary | ICD-10-CM | POA: Diagnosis present

## 2024-02-11 DIAGNOSIS — J449 Chronic obstructive pulmonary disease, unspecified: Secondary | ICD-10-CM | POA: Diagnosis present

## 2024-02-11 DIAGNOSIS — Z6835 Body mass index (BMI) 35.0-35.9, adult: Secondary | ICD-10-CM | POA: Diagnosis not present

## 2024-02-11 DIAGNOSIS — F419 Anxiety disorder, unspecified: Secondary | ICD-10-CM | POA: Diagnosis present

## 2024-02-11 DIAGNOSIS — Z79899 Other long term (current) drug therapy: Secondary | ICD-10-CM | POA: Diagnosis not present

## 2024-02-11 DIAGNOSIS — R627 Adult failure to thrive: Secondary | ICD-10-CM | POA: Diagnosis present

## 2024-02-11 DIAGNOSIS — N390 Urinary tract infection, site not specified: Secondary | ICD-10-CM | POA: Diagnosis present

## 2024-02-11 DIAGNOSIS — A084 Viral intestinal infection, unspecified: Secondary | ICD-10-CM | POA: Diagnosis present

## 2024-02-11 DIAGNOSIS — G894 Chronic pain syndrome: Secondary | ICD-10-CM | POA: Diagnosis present

## 2024-02-11 DIAGNOSIS — R609 Edema, unspecified: Secondary | ICD-10-CM

## 2024-02-11 DIAGNOSIS — Z79890 Hormone replacement therapy (postmenopausal): Secondary | ICD-10-CM | POA: Diagnosis not present

## 2024-02-11 DIAGNOSIS — Z1152 Encounter for screening for COVID-19: Secondary | ICD-10-CM | POA: Diagnosis not present

## 2024-02-11 DIAGNOSIS — E7800 Pure hypercholesterolemia, unspecified: Secondary | ICD-10-CM | POA: Diagnosis present

## 2024-02-11 DIAGNOSIS — J9610 Chronic respiratory failure, unspecified whether with hypoxia or hypercapnia: Secondary | ICD-10-CM | POA: Diagnosis present

## 2024-02-11 DIAGNOSIS — K219 Gastro-esophageal reflux disease without esophagitis: Secondary | ICD-10-CM | POA: Diagnosis present

## 2024-02-11 DIAGNOSIS — E039 Hypothyroidism, unspecified: Secondary | ICD-10-CM | POA: Diagnosis present

## 2024-02-11 DIAGNOSIS — F32A Depression, unspecified: Secondary | ICD-10-CM | POA: Diagnosis present

## 2024-02-11 DIAGNOSIS — I872 Venous insufficiency (chronic) (peripheral): Secondary | ICD-10-CM | POA: Diagnosis present

## 2024-02-11 DIAGNOSIS — E876 Hypokalemia: Secondary | ICD-10-CM | POA: Diagnosis present

## 2024-02-11 DIAGNOSIS — I5032 Chronic diastolic (congestive) heart failure: Secondary | ICD-10-CM | POA: Diagnosis present

## 2024-02-11 DIAGNOSIS — Z87891 Personal history of nicotine dependence: Secondary | ICD-10-CM | POA: Diagnosis not present

## 2024-02-11 DIAGNOSIS — L03115 Cellulitis of right lower limb: Secondary | ICD-10-CM | POA: Diagnosis present

## 2024-02-11 LAB — CBC
HCT: 44 % (ref 36.0–46.0)
Hemoglobin: 13.3 g/dL (ref 12.0–15.0)
MCH: 30 pg (ref 26.0–34.0)
MCHC: 30.2 g/dL (ref 30.0–36.0)
MCV: 99.3 fL (ref 80.0–100.0)
Platelets: 292 K/uL (ref 150–400)
RBC: 4.43 MIL/uL (ref 3.87–5.11)
RDW: 16.6 % — ABNORMAL HIGH (ref 11.5–15.5)
WBC: 8.8 K/uL (ref 4.0–10.5)
nRBC: 0 % (ref 0.0–0.2)

## 2024-02-11 LAB — HIV ANTIBODY (ROUTINE TESTING W REFLEX)
HIV Screen 4th Generation wRfx: NONREACTIVE
HIV Screen 4th Generation wRfx: NONREACTIVE

## 2024-02-11 LAB — BASIC METABOLIC PANEL WITH GFR
Anion gap: 9 (ref 5–15)
BUN: <5 mg/dL — ABNORMAL LOW (ref 8–23)
CO2: 27 mmol/L (ref 22–32)
Calcium: 8.6 mg/dL — ABNORMAL LOW (ref 8.9–10.3)
Chloride: 106 mmol/L (ref 98–111)
Creatinine, Ser: 0.63 mg/dL (ref 0.44–1.00)
GFR, Estimated: >60 mL/min (ref >60)
Glucose, Bld: 83 mg/dL (ref 70–99)
Potassium: 3.5 mmol/L (ref 3.5–5.1)
Sodium: 142 mmol/L (ref 135–145)

## 2024-02-11 LAB — MRSA NEXT GEN BY PCR, NASAL: MRSA by PCR Next Gen: NOT DETECTED

## 2024-02-11 MED ORDER — SODIUM CHLORIDE 0.9 % IV SOLN: INTRAVENOUS | Status: AC

## 2024-02-11 MED ORDER — ORAL CARE MOUTH RINSE
15.0000 mL | OROMUCOSAL | Status: DC | PRN
Start: 2024-02-11 — End: 2024-02-14

## 2024-02-11 NOTE — Evaluation (Signed)
 Physical Therapy Evaluation Patient Details Name: [name] MRN: [id number] DOB: [birth year] Today's Date: 02/11/2024  History of Present Illness  Pt is a 68 y.o. female with medical history significant of COPD with chronic respiratory failure (wears 2L PRN), gout, HLD, HTN, hypothyroidism, chronic pain syndrome with impaired mobility, who presented to ED with complaints of abdominal pain and vomiting and diarrhea over the last 3 days. She has had chills and sweats and subjective fever. She states she also has had a headache. She states she has had vomiting maybe 4-5x/day and has had diarrhea all day long.  Clinical Impression  Pt admitted as above and presenting with functional mobility limitations 2* generalized weakness, bil LE pain with activity and ambulatory balance deficits.  This date, pt up to [facility] for toileting and up to ambulate 48' in hall with rollator.  Pt currently receives assist of PCA at home Mon-Friday for 6 hrs/day but states more can be arranged.  Pt states will NOT consider follow up rehab setting but is willing to accept HHPT/OT follow up.      If plan is discharge home, recommend the following: A little help with walking and/or transfers;A little help with bathing/dressing/bathroom;Assistance with cooking/housework;Assist for transportation;Help with stairs or ramp for entrance   Can travel by private vehicle        Equipment Recommendations None recommended by PT  Recommendations for Other Services       Functional Status Assessment Patient has had a recent decline in their functional status and demonstrates the ability to make significant improvements in function in a reasonable and predictable amount of time.     Precautions / Restrictions Precautions Precautions: Fall Restrictions Weight Bearing Restrictions Per Provider Order: No      Mobility  Bed Mobility Overal bed mobility: Needs Assistance Bed Mobility: Supine to Sit, Sit to Supine      Supine to sit: Supervision, HOB elevated Sit to supine: Supervision   General bed mobility comments: Increased time, HOB elevated, use of bed rail but no physical assist    Transfers Overall transfer level: Needs assistance Equipment used: Rollator (4 wheels) Transfers: Sit to/from Stand, Bed to chair/wheelchair/BSC Sit to Stand: Min assist Stand pivot transfers: Min assist, Contact guard assist         General transfer comment: cues for use of UEs to self assist; min assist from bedside but CGA from [facility].  Step pvt bed to [facility] with rollator    Ambulation/Gait Ambulation/Gait assistance: Contact guard assist Gait Distance (Feet): 58 Feet Assistive device: Rollator (4 wheels) Gait Pattern/deviations: Step-through pattern, Decreased step length - right, Decreased step length - left, Shuffle, Trunk flexed, Wide base of support Gait velocity: decr     General Gait Details: Increased time, min initial steady assist but noted improvement in stability with increased distance ambulated.  Pt states pain limited  Stairs            Wheelchair Mobility     Tilt Bed    Modified Rankin (Stroke Patients Only)       Balance Overall balance assessment: Needs assistance Sitting-balance support: No upper extremity supported Sitting balance-Leahy Scale: Good     Standing balance support: Bilateral upper extremity supported Standing balance-Leahy Scale: Poor                               Pertinent Vitals/Pain Pain Assessment Pain Assessment: 0-10 Pain Score: 8  Pain Location: head  and stomach Pain Descriptors / Indicators: Pressure Pain Intervention(s): Limited activity within patient's tolerance, Monitored during session, Premedicated before session, Patient requesting pain meds-RN notified    Home Living Family/patient expects to be discharged to:: Private residence Living Arrangements: Alone Available Help at Discharge: Available PRN/intermittently Type  of Home: Apartment Home Access: Level entry       Home Layout: One level Home Equipment: Rollator (4 wheels);BSC/3in1;Shower seat - built in Additional Comments: Pt reports she has PCA Mon-Friday for 6 hrs but no assist on Weekends    Prior Function Prior Level of Function : Needs assist             Mobility Comments: pt able to get to toilet alone but has A with other ADL activity ADLs Comments: wears oxygen when she needs it     Extremity/Trunk Assessment   Upper Extremity Assessment Upper Extremity Assessment: Generalized weakness    Lower Extremity Assessment Lower Extremity Assessment: Generalized weakness;RLE deficits/detail;LLE deficits/detail RLE Deficits / Details: Noted bil edema with pt reporting significant discomfort with mobility       Communication   Communication Communication: Impaired Factors Affecting Communication: Hearing impaired    Cognition Arousal: Alert Behavior During Therapy: WFL for tasks assessed/performed   PT - Cognitive impairments: No family/caregiver present to determine baseline                         Following commands: Intact       Cueing Cueing Techniques: Verbal cues, Gestural cues     General Comments      Exercises     Assessment/Plan    PT Assessment Patient needs continued PT services  PT Problem List Decreased strength;Decreased activity tolerance;Decreased mobility;Decreased knowledge of use of DME;Pain;Obesity;Decreased safety awareness       PT Treatment Interventions DME instruction;Gait training;Functional mobility training;Therapeutic activities;Therapeutic exercise;Patient/family education    PT Goals (Current goals can be found in the Care Plan section)  Acute Rehab PT Goals Patient Stated Goal: Regain IND to return home to previous living arrangement PT Goal Formulation: With patient Time For Goal Achievement: 02/25/24 Potential to Achieve Goals: Good    Frequency Min 3X/week      Co-evaluation               AM-PAC PT 6 Clicks Mobility  Outcome Measure Help needed turning from your back to your side while in a flat bed without using bedrails?: None Help needed moving from lying on your back to sitting on the side of a flat bed without using bedrails?: A Little Help needed moving to and from a bed to a chair (including a wheelchair)?: A Little Help needed standing up from a chair using your arms (e.g., wheelchair or bedside chair)?: A Little Help needed to walk in hospital room?: A Little Help needed climbing 3-5 steps with a railing? : A Lot 6 Click Score: 18    End of Session Equipment Utilized During Treatment: Gait belt;Oxygen Activity Tolerance: Patient tolerated treatment well;Patient limited by pain Patient left: in bed;with call bell/phone within reach;with bed alarm set Nurse Communication: Mobility status PT Visit Diagnosis: Unsteadiness on feet (R26.81);Difficulty in walking, not elsewhere classified (R26.2)    Time: 8487-8458 PT Time Calculation (min) (ACUTE ONLY): 29 min   Charges:   PT Evaluation $PT Eval Low Complexity: 1 Low PT Treatments $Gait Training: 8-22 mins PT General Charges $$ ACUTE PT VISIT: 1 Visit  [facility] PT Acute Rehabilitation Services Office [phone]   Shawndale Kilpatrick 02/11/2024, 4:06 PM

## 2024-02-11 NOTE — Progress Notes (Signed)
 PROGRESS NOTE    [name]  FMW:[id number] DOB: [birth year] DOA: 02/10/2024 PCP: Shayne Anes, MD  Brief Narrative: 68 y.o. female with medical history significant of COPD with chronic respiratory failure (wears 2L PRN), gout, HLD, HTN, hypothyroidism, chronic pain syndrome with impaired mobility, who presented to ED with complaints of abdominal pain and vomiting and diarrhea over the last 3 days. She has had chills and sweats and subjective fever. She states she also has had a headache. She states she has had vomiting maybe 4-5x/day and has had diarrhea all day long. She also has increased urinary frequency, urgency and dysuria. She is unsure if her urine is a different color. It has a smell to it. Her abdominal is generalized. Rated as a 7/10 and described as sharp. Nothing makes it better or worse. She also feels weak and fatigued.  She did complain of a headache, no changes with her vision no chest pain shortness of breath or cough.  She lives at home alone.  Assessment & Plan:   Principal Problem:   Failure to thrive in adult Active Problems:   Diarrhea and vomiting   UTI (urinary tract infection)   Venous stasis dermatitis of both lower extremities/+/- cellulitis fo RLL   Left hip pain   Hypokalemia   COPD witih chronic respiratory failure   Chronic diastolic CHF (congestive heart failure) (HCC)   Acquired hypothyroidism   Chronic pain syndrome   Anxiety and depression   Hyperlipidemia   GERD (gastroesophageal reflux disease)  #1 viral gastroenteritis patient admitted with nausea vomiting diarrhea and abdominal pain with generalized weakness and fatigue with poor p.o. intake. She complains of nausea still. Will attempt clear liquid diet today. Continue IV fluids. CT abdomen pelvis with no acute findings.  #2 urinary tract infection patient presented with nausea and vomiting and abdominal pain dysuria frequency and urgency.  UA was positive for UTI.  Urine culture pending.   Continue Rocephin .  #3 mild right lower extremity cellulitis improving on Rocephin  continue.  She has chronic stasis dermatitis of bilateral lower extremities. Venous Doppler pending  #4 left hip pain she denies history of falls. X-ray of the left hip severe degenerative changes of the left hip, mild to moderate degenerative changes of the right hip Consult physical therapy  #5 history of COPD on as needed oxygen prior to admission continue home inhalers  #6 history of diastolic heart failure patient appears on the dry side.  Continue IV fluids with caution.  #7 hypothyroidism continue Synthroid  TSH 4.7  #8 chronic pain continue gabapentin  and Percocet  #9 anxiety depression continue Xanax  Effexor  and Wellbutrin  as needed  #10 GERD on Protonix   #11 hyperlipidemia on Lipitor  #12 hypokalemia repleted potassium 3.5 magnesium  2.0   Estimated body mass index is 35.19 kg/m as calculated from the following:   Height as of this encounter: 5' 4 (1.626 m).   Weight as of this encounter: 93 kg.  DVT prophylaxis: Lovenox  Code Status: Full code  family Communication: None  disposition Plan:  Status is: Observation   Consultants:  none  Procedures:  Antimicrobials:  Subjective:   She complains of being nauseous and having a headache denied any vomiting or diarrhea continues to have some abdominal discomfort and pain overnight nurse reported that he put her on oxygen as sats dropped to the 80s patient has as needed oxygen at home Objective: Vitals:   02/11/24 0900 02/11/24 0917 02/11/24 1000 02/11/24 1100  BP:      Pulse:  Resp: 11 17 15    Temp:      TempSrc:      SpO2:    96%  Weight:      Height:        Intake/Output Summary (Last 24 hours) at 02/11/2024 1222 Last data filed at 02/11/2024 1210 Gross per 24 hour  Intake 1671.75 ml  Output 500 ml  Net 1171.75 ml   Filed Weights   02/10/24 2338  Weight: 93 kg    Examination:  General exam: Appears in no  acute distress Respiratory system: Clear to auscultation. Respiratory effort normal. Cardiovascular system: S1 & S2 heard, RRR. No JVD, murmurs, rubs, gallops or clicks. No pedal edema. Gastrointestinal system: Abdomen is nondistended, soft and nontender. No organomegaly or masses felt. Normal bowel sounds heard. Central nervous system: Alert and oriented. No focal neurological deficits. Extremities: Bilateral lower extremity edema right more than left mild erythema to the right leg    Data Reviewed: I have personally reviewed following labs and imaging studies  CBC: Recent Labs  Lab 02/10/24 1649 02/11/24 0732  WBC 9.0 8.8  HGB 14.1 13.3  HCT 45.0 44.0  MCV 95.7 99.3  PLT 323 292   Basic Metabolic Panel: Recent Labs  Lab 02/10/24 1649 02/10/24 2245 02/11/24 0732  NA 141  --  142  K 3.1*  --  3.5  CL 105  --  106  CO2 26  --  27  GLUCOSE 86  --  83  BUN <5*  --  <5*  CREATININE 0.72  --  0.63  CALCIUM  9.2  --  8.6*  MG  --  2.0  --    GFR: Estimated Creatinine Clearance: 75.4 mL/min (by C-G formula based on SCr of 0.63 mg/dL). Liver Function Tests: Recent Labs  Lab 02/10/24 1649  AST 13*  ALT 10  ALKPHOS 146*  BILITOT 0.9  PROT 7.2  ALBUMIN  3.6   Recent Labs  Lab 02/10/24 1649  LIPASE 20   No results for input(s): AMMONIA in the last 168 hours. Coagulation Profile: No results for input(s): INR, PROTIME in the last 168 hours. Cardiac Enzymes: No results for input(s): CKTOTAL, CKMB, CKMBINDEX, TROPONINI in the last 168 hours. BNP (last 3 results) No results for input(s): PROBNP in the last 8760 hours. HbA1C: No results for input(s): HGBA1C in the last 72 hours. CBG: No results for input(s): GLUCAP in the last 168 hours. Lipid Profile: No results for input(s): CHOL, HDL, LDLCALC, TRIG, CHOLHDL, LDLDIRECT in the last 72 hours. Thyroid  Function Tests: Recent Labs    02/10/24 2244  TSH 4.715*   Anemia Panel: Recent  Labs    02/10/24 2244  VITAMINB12 289   Sepsis Labs: Recent Labs  Lab 02/10/24 1855  LATICACIDVEN 0.9    Recent Results (from the past 240 hours)  Resp panel by RT-PCR (RSV, Flu A&B, Covid) Anterior Nasal Swab     Status: None   Collection Time: 02/10/24  5:46 PM   Specimen: Anterior Nasal Swab  Result Value Ref Range Status   SARS Coronavirus 2 by RT PCR NEGATIVE NEGATIVE Final    Comment: (NOTE) SARS-CoV-2 target nucleic acids are NOT DETECTED.  The SARS-CoV-2 RNA is generally detectable in upper respiratory specimens during the acute phase of infection. The lowest concentration of SARS-CoV-2 viral copies this assay can detect is 138 copies/mL. A negative result does not preclude SARS-Cov-2 infection and should not be used as the sole basis for treatment or other patient management decisions. A  negative result may occur with  improper specimen collection/handling, submission of specimen other than nasopharyngeal swab, presence of viral mutation(s) within the areas targeted by this assay, and inadequate number of viral copies(<138 copies/mL). A negative result must be combined with clinical observations, patient history, and epidemiological information. The expected result is Negative.  Fact Sheet for Patients:  BloggerCourse.com  Fact Sheet for Healthcare Providers:  SeriousBroker.it  This test is no t yet approved or cleared by the [place]  FDA and  has been authorized for detection and/or diagnosis of SARS-CoV-2 by FDA under an Emergency Use Authorization (EUA). This EUA will remain  in effect (meaning this test can be used) for the duration of the COVID-19 declaration under Section 564(b)(1) of the Act, 21 U.S.C.section 360bbb-3(b)(1), unless the authorization is terminated  or revoked sooner.       Influenza A by PCR NEGATIVE NEGATIVE Final   Influenza B by PCR NEGATIVE NEGATIVE Final    Comment:  (NOTE) The Xpert Xpress SARS-CoV-2/FLU/RSV plus assay is intended as an aid in the diagnosis of influenza from Nasopharyngeal swab specimens and should not be used as a sole basis for treatment. Nasal washings and aspirates are unacceptable for Xpert Xpress SARS-CoV-2/FLU/RSV testing.  Fact Sheet for Patients: BloggerCourse.com  Fact Sheet for Healthcare Providers: SeriousBroker.it  This test is not yet approved or cleared by the [place]  FDA and has been authorized for detection and/or diagnosis of SARS-CoV-2 by FDA under an Emergency Use Authorization (EUA). This EUA will remain in effect (meaning this test can be used) for the duration of the COVID-19 declaration under Section 564(b)(1) of the Act, 21 U.S.C. section 360bbb-3(b)(1), unless the authorization is terminated or revoked.     Resp Syncytial Virus by PCR NEGATIVE NEGATIVE Final    Comment: (NOTE) Fact Sheet for Patients: BloggerCourse.com  Fact Sheet for Healthcare Providers: SeriousBroker.it  This test is not yet approved or cleared by the [place]  FDA and has been authorized for detection and/or diagnosis of SARS-CoV-2 by FDA under an Emergency Use Authorization (EUA). This EUA will remain in effect (meaning this test can be used) for the duration of the COVID-19 declaration under Section 564(b)(1) of the Act, 21 U.S.C. section 360bbb-3(b)(1), unless the authorization is terminated or revoked.  Performed at [facility], [address], [place], [place] [address]   Blood culture (routine x 2)     Status: None (Preliminary result)   Collection Time: 02/10/24  6:45 PM   Specimen: BLOOD  Result Value Ref Range Status   Specimen Description   Final    BLOOD BLOOD RIGHT FOREARM Performed at [facility], [address], [place], [place] [address]    Special Requests    Final    BOTTLES DRAWN AEROBIC AND ANAEROBIC Blood Culture adequate volume Performed at [facility], [address], [place], [place] [address]    Culture   Final    NO GROWTH < 12 HOURS Performed at [facility], [address] [place], [place] [address]    Report Status PENDING  Incomplete  Blood culture (routine x 2)     Status: None (Preliminary result)   Collection Time: 02/10/24  7:04 PM   Specimen: BLOOD  Result Value Ref Range Status   Specimen Description   Final    BLOOD LEFT ANTECUBITAL Performed at [facility], [address] [place], [place] [address]    Special Requests   Final  BOTTLES DRAWN AEROBIC AND ANAEROBIC Blood Culture adequate volume Performed at [facility], [address], [place], [place] [address]    Culture   Final    NO GROWTH < 12 HOURS Performed at [facility], [address], [place], [place] [address]    Report Status PENDING  Incomplete  MRSA Next Gen by PCR, Nasal     Status: None   Collection Time: 02/11/24 12:29 AM   Specimen: Nasal Mucosa; Nasal Swab  Result Value Ref Range Status   MRSA by PCR Next Gen NOT DETECTED NOT DETECTED Final    Comment: (NOTE) The GeneXpert MRSA Assay (FDA approved for NASAL specimens only), is one component of a comprehensive MRSA colonization surveillance program. It is not intended to diagnose MRSA infection nor to guide or monitor treatment for MRSA infections. Test performance is not FDA approved in patients less than 36 years old. Performed at [facility], [address] [place], [place] [address]          Radiology Studies: DG HIP UNILAT WITH PELVIS 2-3 VIEWS LEFT Result Date: 02/10/2024 CLINICAL DATA:  Hip pain EXAM: DG HIP (WITH OR WITHOUT PELVIS) 2-3V LEFT COMPARISON:  None Available. FINDINGS: No acute fracture or dislocation identified. There severe degenerative changes of the left hip with  bone-on-bone configuration, sclerosis and osteophyte formation. There are mild-to-moderate degenerative changes of the right hip. There are severe degenerative changes of the lower lumbar spine. Contrast is seen within the bladder. IMPRESSION: 1. Severe degenerative changes of the left hip. 2. Mild-to-moderate degenerative changes of the right hip. Electronically Signed   By: Greig Pique M.D.   On: 02/10/2024 22:55   CT ABDOMEN PELVIS W CONTRAST Result Date: 02/10/2024 CLINICAL DATA:  Left lower quadrant pain EXAM: CT ABDOMEN AND PELVIS WITH CONTRAST TECHNIQUE: Multidetector CT imaging of the abdomen and pelvis was performed using the standard protocol following bolus administration of intravenous contrast. RADIATION DOSE REDUCTION: This exam was performed according to the departmental dose-optimization program which includes automated exposure control, adjustment of the mA and/or kV according to patient size and/or use of iterative reconstruction technique. CONTRAST:  OMNIPAQUE  IOHEXOL  300 MG/ML  SOLN COMPARISON:  CT abdomen and pelvis 12/06/2022 FINDINGS: Lower chest: No acute abnormality. Hepatobiliary: No focal liver abnormality is seen. Status post cholecystectomy. No biliary dilatation. Pancreas: Unremarkable. No pancreatic ductal dilatation or surrounding inflammatory changes. Spleen: Normal in size without focal abnormality. Adrenals/Urinary Tract: Adrenal glands are unremarkable. Kidneys are normal, without renal calculi, focal lesion, or hydronephrosis. Bladder is unremarkable. Stomach/Bowel: Stomach is within normal limits. Appendix appears normal. No evidence of bowel wall thickening, distention, or inflammatory changes. Vascular/Lymphatic: Aortic atherosclerosis. No enlarged abdominal or pelvic lymph nodes. Reproductive: Status post hysterectomy. No adnexal masses. Other: No abdominal wall hernia or abnormality. No abdominopelvic ascites. Musculoskeletal: Advanced degenerative changes of the  spine persists there severe degenerative changes of the left hip with bone-on-bone configuration, progressed compared to the prior study. Left hip joint effusion again seen IMPRESSION: 1. No acute localizing process in the abdomen or pelvis. 2. Increased severe degenerative changes of the left hip. Unchanged left hip joint effusion. Aortic Atherosclerosis (ICD10-I70.0). Electronically Signed   By: Greig Pique M.D.   On: 02/10/2024 20:28   DG Chest 2 View Result Date: 02/10/2024 CLINICAL DATA:  Cough. EXAM: CHEST - 2 VIEW COMPARISON:  September 10, 2023. FINDINGS: The heart size and mediastinal contours are within normal limits. Both lungs are clear. The visualized  skeletal structures are unremarkable. IMPRESSION: No active cardiopulmonary disease. Electronically Signed   By: Lynwood Landy Raddle M.D.   On: 02/10/2024 18:18   Scheduled Meds:  ARIPiprazole   10 mg Oral Daily   atorvastatin   10 mg Oral Daily   budesonide -glycopyrrolate -formoterol   2 puff Inhalation BID   buPROPion   150 mg Oral Daily   enoxaparin  (LOVENOX ) injection  40 mg Subcutaneous Q24H   gabapentin   300 mg Oral BID   levothyroxine   150 mcg Oral Q0600   oxyCODONE -acetaminophen   1 tablet Oral BID   pantoprazole   40 mg Oral Daily   venlafaxine  XR  150 mg Oral QHS   Continuous Infusions:  cefTRIAXone  (ROCEPHIN )  IV 1 g (02/11/24 0925)     LOS: 0 days   Almarie KANDICE Hoots, MD  02/11/2024, 12:22 PM

## 2024-02-11 NOTE — Care Management Obs Status (Signed)
 MEDICARE OBSERVATION STATUS NOTIFICATION   Patient Details  Name: FRANSHESCA CHIPMAN MRN: [id number] Date of Birth: 07/28/1956   Medicare Observation Status Notification Given:  Yes    Sheri ONEIDA Sharps, LCSW 02/11/2024, 12:31 PM

## 2024-02-11 NOTE — Evaluation (Signed)
 Occupational Therapy Evaluation Patient Details Name: [name] MRN: [id number] DOB: 1956/03/28 Today's Date: 02/11/2024   History of Present Illness   Pt is a 68 y.o. female with medical history significant of COPD with chronic respiratory failure (wears 2L PRN), gout, HLD, HTN, hypothyroidism, chronic pain syndrome with impaired mobility, who presented to ED with complaints of abdominal pain and vomiting and diarrhea over the last 3 days. She has had chills and sweats and subjective fever. She states she also has had a headache. She states she has had vomiting maybe 4-5x/day and has had diarrhea all day long.     Clinical Impressions Pt admitted with the above. Pt currently with functional limitations due to the deficits listed below (see OT Problem List).  Pt will benefit from acute skilled OT to increase their safety and independence with ADL and functional mobility for ADL to facilitate discharge.       If plan is discharge home, recommend the following:   A little help with walking and/or transfers;A little help with bathing/dressing/bathroom;Assistance with cooking/housework     Functional Status Assessment   Patient has had a recent decline in their functional status and demonstrates the ability to make significant improvements in function in a reasonable and predictable amount of time.      Recommendations for Other Services    HHOT OT and increased hours with personal aide      Mobility Bed Mobility Overal bed mobility: Needs Assistance Bed Mobility: Rolling Rolling: Min assist              Transfers Overall transfer level: Needs assistance Equipment used: 1 person hand held assist Transfers: Sit to/from Stand, Bed to chair/wheelchair/BSC Sit to Stand: Mod assist Stand pivot transfers: Mod assist                Balance Overall balance assessment: Needs assistance Sitting-balance support: Single extremity supported Sitting balance-Leahy  Scale: Fair     Standing balance support: Bilateral upper extremity supported Standing balance-Leahy Scale: Poor                             ADL either performed or assessed with clinical judgement   ADL Overall ADL's : Needs assistance/impaired Eating/Feeding: Minimal assistance;Sitting   Grooming: Minimal assistance;Sitting   Upper Body Bathing: Minimal assistance;Sitting   Lower Body Bathing: Maximal assistance;Sit to/from stand;Cueing for safety;Cueing for sequencing   Upper Body Dressing : Minimal assistance;Sitting   Lower Body Dressing: Maximal assistance;Sit to/from stand;Cueing for safety;Cueing for sequencing   Toilet Transfer: Maximal assistance;Squat-pivot   Toileting- Clothing Manipulation and Hygiene: Maximal assistance;Sit to/from stand;Sitting/lateral lean       Functional mobility during ADLs: +2 for physical assistance;+2 for safety/equipment;Rolling walker (2 wheels);Cueing for safety;Cueing for sequencing;Maximal assistance       Vision   Vision Assessment?: No apparent visual deficits            Pertinent Vitals/Pain Pain Assessment Pain Assessment: Faces Pain Score: 3  Pain Location: head and stomach Pain Descriptors / Indicators: Pressure Pain Intervention(s): Limited activity within patient's tolerance, Monitored during session, Repositioned     Extremity/Trunk Assessment Upper Extremity Assessment Upper Extremity Assessment: Generalized weakness           Communication     Cognition Arousal: Alert Behavior During Therapy: WFL for tasks assessed/performed Cognition: No apparent impairments  Home Living Family/patient expects to be discharged to:: Private residence Living Arrangements: Alone Available Help at Discharge: Available PRN/intermittently Type of Home: Apartment Home Access: Level entry     Home Layout: One level     Bathroom  Shower/Tub: Producer, television/film/video: Standard     Home Equipment: Rollator (4 wheels);BSC/3in1;Shower seat - built in          Prior Functioning/Environment Prior Level of Function : Needs assist  Cognitive Assist : ADLs (cognitive)     Physical Assist : ADLs (physical);Mobility (physical)     Mobility Comments: pt able to get to toilet alone but has A with other ADL activity ADLs Comments: wears oxygen when she needs it    OT Problem List: Decreased strength;Decreased activity tolerance;Impaired balance (sitting and/or standing);Decreased safety awareness   OT Treatment/Interventions: Self-care/ADL training;Patient/family education      OT Goals(Current goals can be found in the care plan section)   Acute Rehab OT Goals Patient Stated Goal: home with aide.  i will not go to a rehab OT Goal Formulation: With patient Time For Goal Achievement: 02/25/24 Potential to Achieve Goals: Good ADL Goals Pt Will Perform Lower Body Dressing: with min assist;sit to/from stand Pt Will Transfer to Toilet: with modified independence;bedside commode Pt Will Perform Toileting - Clothing Manipulation and hygiene: with modified independence;sit to/from stand   OT Frequency:  Min 2X/week       AM-PAC OT 6 Clicks Daily Activity     Outcome Measure Help from another person eating meals?: A Little Help from another person taking care of personal grooming?: A Little Help from another person toileting, which includes using toliet, bedpan, or urinal?: A Lot Help from another person bathing (including washing, rinsing, drying)?: A Lot Help from another person to put on and taking off regular upper body clothing?: A Lot Help from another person to put on and taking off regular lower body clothing?: A Lot 6 Click Score: 14   End of Session Equipment Utilized During Treatment: Gait belt;Rollator (4 wheels) Nurse Communication: Mobility status  Activity Tolerance: Patient limited  by fatigue Patient left: in chair;with call bell/phone within reach  OT Visit Diagnosis: Unsteadiness on feet (R26.81);Muscle weakness (generalized) (M62.81)                Time: 9045-8983 OT Time Calculation (min): 22 min Charges:  OT Evaluation $OT Eval Low Complexity: 1 Low    Domanique Luckett, Norvel BIRCH 02/11/2024, 11:29 AM

## 2024-02-11 NOTE — Plan of Care (Signed)

## 2024-02-12 DIAGNOSIS — R627 Adult failure to thrive: Secondary | ICD-10-CM | POA: Diagnosis not present

## 2024-02-12 MED ORDER — MORPHINE SULFATE (PF) 2 MG/ML IV SOLN
2.0000 mg | Freq: Once | INTRAVENOUS | Status: AC
  Administered 2024-02-12: 2 mg via INTRAVENOUS
  Filled 2024-02-12: qty 1

## 2024-02-12 NOTE — Progress Notes (Signed)
 Mobility Specialist - Progress Note   02/12/24 1416  Oxygen Therapy  O2 Device Nasal Cannula  O2 Flow Rate (L/min) 2 L/min  Mobility  Activity Ambulated with assistance in hallway  Level of Assistance Standby assist, set-up cues, supervision of patient - no hands on  Assistive Device Front wheel walker  Distance Ambulated (ft) 45 ft  Activity Response Tolerated well  Mobility Referral Yes  Mobility visit 1 Mobility  Mobility Specialist Start Time (ACUTE ONLY) 1350  Mobility Specialist Stop Time (ACUTE ONLY) 1415  Mobility Specialist Time Calculation (min) (ACUTE ONLY) 25 min   Pt received in bed and agreeable to mobility. C/o leg pain during session. Pt to bed after session with all needs met.   [facility]

## 2024-02-12 NOTE — Progress Notes (Signed)
 PROGRESS NOTE    [name]  FMW:[id number] DOB: 1956-06-13 DOA: 02/10/2024 PCP: Shayne Anes, MD  Brief Narrative: 68 y.o. female with medical history significant of COPD with chronic respiratory failure (wears 2L PRN), gout, HLD, HTN, hypothyroidism, chronic pain syndrome with impaired mobility, who presented to ED with complaints of abdominal pain and vomiting and diarrhea over the last 3 days. She has had chills and sweats and subjective fever. She states she also has had a headache. She states she has had vomiting maybe 4-5x/day and has had diarrhea all day long. She also has increased urinary frequency, urgency and dysuria. She is unsure if her urine is a different color. It has a smell to it. Her abdominal is generalized. Rated as a 7/10 and described as sharp. Nothing makes it better or worse. She also feels weak and fatigued.  She did complain of a headache, no changes with her vision no chest pain shortness of breath or cough.  She lives at home alone.  Assessment & Plan:   Principal Problem:   Failure to thrive in adult Active Problems:   Diarrhea and vomiting   UTI (urinary tract infection)   Venous stasis dermatitis of both lower extremities/+/- cellulitis fo RLL   Left hip pain   Hypokalemia   COPD witih chronic respiratory failure   Chronic diastolic CHF (congestive heart failure) (HCC)   Acquired hypothyroidism   Chronic pain syndrome   Anxiety and depression   Hyperlipidemia   GERD (gastroesophageal reflux disease)  #1 viral gastroenteritis patient admitted with nausea vomiting diarrhea and abdominal pain with generalized weakness and fatigue with poor p.o. intake. She complains of nausea still.advance diet Continue IV fluids. CT abdomen pelvis with no acute findings.  #2 urinary tract infection patient presented with nausea and vomiting and abdominal pain dysuria frequency and urgency.  UA was positive for UTI.  Urine culture more than 100,000 colonies of  gram-negative rods sensitivity pending identification pending.  Continue Rocephin .  #3 mild right lower extremity cellulitis improving on Rocephin  continue.  She has chronic stasis dermatitis of bilateral lower extremities. Venous Doppler no dvt  #4 left hip pain she denies history of falls. X-ray of the left hip severe degenerative changes of the left hip, mild to moderate degenerative changes of the right hip Consult physical therapy  #5 history of COPD on as needed oxygen prior to admission continue home inhalers  #6 history of diastolic heart failure patient appears on the dry side.  Continue IV fluids with caution.  #7 hypothyroidism continue Synthroid  TSH 4.7  #8 chronic pain continue gabapentin  and Percocet  #9 anxiety depression continue Xanax  Effexor  and Wellbutrin  as needed  #10 GERD on Protonix   #11 hyperlipidemia on Lipitor  #12 hypokalemia repleted potassium 3.5 magnesium  2.0   Estimated body mass index is 35.19 kg/m as calculated from the following:   Height as of this encounter: 5' 4 (1.626 m).   Weight as of this encounter: 93 kg.  DVT prophylaxis: Lovenox  Code Status: Full code  family Communication: None  disposition Plan:  Status is: Observation   Consultants:  none  Procedures:  Antimicrobials:  Subjective: Complains of severe pain to her right leg, slept okay nausea improved   Objective: Vitals:   02/11/24 1946 02/11/24 2001 02/12/24 0550 02/12/24 0935  BP:  (!) 112/55 99/62   Pulse:  75 72   Resp:  20 20   Temp:  98.2 F (36.8 C) 98.7 F (37.1 C)   TempSrc:  Oral Oral   SpO2: 99% 100% 96% 98%  Weight:      Height:        Intake/Output Summary (Last 24 hours) at 02/12/2024 1241 Last data filed at 02/12/2024 1052 Gross per 24 hour  Intake 3064.63 ml  Output 2575 ml  Net 489.63 ml   Filed Weights   02/10/24 2338  Weight: 93 kg    Examination:  General exam: Appears in no acute distress Respiratory system: Clear to  auscultation. Respiratory effort normal. Cardiovascular system: S1 & S2 heard, RRR. No JVD, murmurs, rubs, gallops or clicks. No pedal edema. Gastrointestinal system: Abdomen is nondistended, soft and nontender. No organomegaly or masses felt. Normal bowel sounds heard. Central nervous system: Alert and oriented. No focal neurological deficits. Extremities: Bilateral lower extremity edema right more than left mild erythema to the right leg    Data Reviewed: I have personally reviewed following labs and imaging studies  CBC: Recent Labs  Lab 02/10/24 1649 02/11/24 0732  WBC 9.0 8.8  HGB 14.1 13.3  HCT 45.0 44.0  MCV 95.7 99.3  PLT 323 292   Basic Metabolic Panel: Recent Labs  Lab 02/10/24 1649 02/10/24 2245 02/11/24 0732  NA 141  --  142  K 3.1*  --  3.5  CL 105  --  106  CO2 26  --  27  GLUCOSE 86  --  83  BUN <5*  --  <5*  CREATININE 0.72  --  0.63  CALCIUM  9.2  --  8.6*  MG  --  2.0  --    GFR: Estimated Creatinine Clearance: 75.4 mL/min (by C-G formula based on SCr of 0.63 mg/dL). Liver Function Tests: Recent Labs  Lab 02/10/24 1649  AST 13*  ALT 10  ALKPHOS 146*  BILITOT 0.9  PROT 7.2  ALBUMIN  3.6   Recent Labs  Lab 02/10/24 1649  LIPASE 20   No results for input(s): AMMONIA in the last 168 hours. Coagulation Profile: No results for input(s): INR, PROTIME in the last 168 hours. Cardiac Enzymes: No results for input(s): CKTOTAL, CKMB, CKMBINDEX, TROPONINI in the last 168 hours. BNP (last 3 results) No results for input(s): PROBNP in the last 8760 hours. HbA1C: No results for input(s): HGBA1C in the last 72 hours. CBG: No results for input(s): GLUCAP in the last 168 hours. Lipid Profile: No results for input(s): CHOL, HDL, LDLCALC, TRIG, CHOLHDL, LDLDIRECT in the last 72 hours. Thyroid  Function Tests: Recent Labs    02/10/24 2244  TSH 4.715*   Anemia Panel: Recent Labs    02/10/24 2244  VITAMINB12 289    Sepsis Labs: Recent Labs  Lab 02/10/24 1855  LATICACIDVEN 0.9    Recent Results (from the past 240 hours)  Resp panel by RT-PCR (RSV, Flu A&B, Covid) Anterior Nasal Swab     Status: None   Collection Time: 02/10/24  5:46 PM   Specimen: Anterior Nasal Swab  Result Value Ref Range Status   SARS Coronavirus 2 by RT PCR NEGATIVE NEGATIVE Final    Comment: (NOTE) SARS-CoV-2 target nucleic acids are NOT DETECTED.  The SARS-CoV-2 RNA is generally detectable in upper respiratory specimens during the acute phase of infection. The lowest concentration of SARS-CoV-2 viral copies this assay can detect is 138 copies/mL. A negative result does not preclude SARS-Cov-2 infection and should not be used as the sole basis for treatment or other patient management decisions. A negative result may occur with  improper specimen collection/handling, submission of specimen other than  nasopharyngeal swab, presence of viral mutation(s) within the areas targeted by this assay, and inadequate number of viral copies(<138 copies/mL). A negative result must be combined with clinical observations, patient history, and epidemiological information. The expected result is Negative.  Fact Sheet for Patients:  BloggerCourse.com  Fact Sheet for Healthcare Providers:  SeriousBroker.it  This test is no t yet approved or cleared by the [place]  FDA and  has been authorized for detection and/or diagnosis of SARS-CoV-2 by FDA under an Emergency Use Authorization (EUA). This EUA will remain  in effect (meaning this test can be used) for the duration of the COVID-19 declaration under Section 564(b)(1) of the Act, 21 U.S.C.section 360bbb-3(b)(1), unless the authorization is terminated  or revoked sooner.       Influenza A by PCR NEGATIVE NEGATIVE Final   Influenza B by PCR NEGATIVE NEGATIVE Final    Comment: (NOTE) The Xpert Xpress SARS-CoV-2/FLU/RSV plus  assay is intended as an aid in the diagnosis of influenza from Nasopharyngeal swab specimens and should not be used as a sole basis for treatment. Nasal washings and aspirates are unacceptable for Xpert Xpress SARS-CoV-2/FLU/RSV testing.  Fact Sheet for Patients: BloggerCourse.com  Fact Sheet for Healthcare Providers: SeriousBroker.it  This test is not yet approved or cleared by the [place]  FDA and has been authorized for detection and/or diagnosis of SARS-CoV-2 by FDA under an Emergency Use Authorization (EUA). This EUA will remain in effect (meaning this test can be used) for the duration of the COVID-19 declaration under Section 564(b)(1) of the Act, 21 U.S.C. section 360bbb-3(b)(1), unless the authorization is terminated or revoked.     Resp Syncytial Virus by PCR NEGATIVE NEGATIVE Final    Comment: (NOTE) Fact Sheet for Patients: BloggerCourse.com  Fact Sheet for Healthcare Providers: SeriousBroker.it  This test is not yet approved or cleared by the [place]  FDA and has been authorized for detection and/or diagnosis of SARS-CoV-2 by FDA under an Emergency Use Authorization (EUA). This EUA will remain in effect (meaning this test can be used) for the duration of the COVID-19 declaration under Section 564(b)(1) of the Act, 21 U.S.C. section 360bbb-3(b)(1), unless the authorization is terminated or revoked.  Performed at [facility], [address], [place], [place] [address]   Blood culture (routine x 2)     Status: None (Preliminary result)   Collection Time: 02/10/24  6:45 PM   Specimen: BLOOD RIGHT FOREARM  Result Value Ref Range Status   Specimen Description   Final    BLOOD RIGHT FOREARM Performed at [facility], [address] [place], [place] [address]    Special Requests   Final    BOTTLES DRAWN AEROBIC AND ANAEROBIC Blood  Culture adequate volume Performed at [facility], [address], [place], [place] [address]    Culture   Final    NO GROWTH 2 DAYS Performed at [facility], [address] [place], [place] [address]    Report Status PENDING  Incomplete  Blood culture (routine x 2)     Status: None (Preliminary result)   Collection Time: 02/10/24  7:04 PM   Specimen: BLOOD  Result Value Ref Range Status   Specimen Description   Final    BLOOD LEFT ANTECUBITAL Performed at [facility], [address], [place], [place] [address]    Special Requests   Final    BOTTLES DRAWN AEROBIC AND ANAEROBIC Blood Culture adequate volume Performed at [facility], [address] [place], [place] [address]    Culture   Final    NO GROWTH 2 DAYS Performed at [facility], [address] [place], [place] [address]    Report Status PENDING  Incomplete  Urine Culture (for pregnant, neutropenic or urologic patients or patients with an indwelling urinary catheter)     Status: Abnormal (Preliminary result)   Collection Time: 02/10/24 11:16 PM   Specimen: Urine, Clean Catch  Result Value Ref Range Status   Specimen Description   Final    URINE, CLEAN CATCH Performed at [facility], [address], [place], [place] [address]    Special Requests   Final    NONE Performed at [facility], [address], [place], [place] [address]    Culture >=100,000 COLONIES/mL GRAM NEGATIVE RODS (A)  Final   Report Status PENDING  Incomplete  MRSA Next Gen by PCR, Nasal     Status: None   Collection Time: 02/11/24 12:29 AM   Specimen: Nasal Mucosa; Nasal Swab  Result Value Ref Range Status   MRSA by PCR Next Gen NOT DETECTED NOT DETECTED Final    Comment: (NOTE) The GeneXpert MRSA Assay (FDA approved for NASAL specimens only), is one component of a comprehensive MRSA colonization surveillance program. It is not  intended to diagnose MRSA infection nor to guide or monitor treatment for MRSA infections. Test performance is not FDA approved in patients less than 73 years old. Performed at [facility], [address], [place], [place] [address]          Radiology Studies: VAS US  LOWER EXTREMITY VENOUS (DVT) Result Date: 02/11/2024  Lower Venous DVT Study Patient Name:  [name]  Date of Exam:   02/11/2024 Medical Rec #: [id number]      Accession #:    [id number] Date of Birth: [birth year]      Patient Gender: F Patient Age:   47 years Exam Location:  [facility] Procedure:      VAS US  LOWER EXTREMITY VENOUS (DVT) Referring Phys: ISAIAH GERALDS --------------------------------------------------------------------------------  Indications: Edema.  Comparison Study: 02/28/2021 Normal Bilateral Performing Technologist: Orvil Holmes RDCS  Examination Guidelines: A complete evaluation includes B-mode imaging, spectral Doppler, color Doppler, and power Doppler as needed of all accessible portions of each vessel. Bilateral testing is considered an integral part of a complete examination. Limited examinations for reoccurring indications may be performed as noted. The reflux portion of the exam is performed with the patient in reverse Trendelenburg.  +---------+---------------+---------+-----------+----------+-------------------+ RIGHT    CompressibilityPhasicitySpontaneityPropertiesThrombus Aging      +---------+---------------+---------+-----------+----------+-------------------+ CFV      Full           Yes      Yes                                      +---------+---------------+---------+-----------+----------+-------------------+ SFJ      Full           Yes      Yes                                      +---------+---------------+---------+-----------+----------+-------------------+ FV Prox  Full           Yes      Yes                                       +---------+---------------+---------+-----------+----------+-------------------+  FV Mid   Full           Yes      Yes                                      +---------+---------------+---------+-----------+----------+-------------------+ FV DistalFull           Yes      Yes                                      +---------+---------------+---------+-----------+----------+-------------------+ PFV      Full                                                             +---------+---------------+---------+-----------+----------+-------------------+ POP      Full           Yes      Yes                                      +---------+---------------+---------+-----------+----------+-------------------+ PTV      Full           Yes      Yes                  Not well visualized +---------+---------------+---------+-----------+----------+-------------------+ PERO     Full           Yes      Yes                  Not well visualized +---------+---------------+---------+-----------+----------+-------------------+ Gastroc  Full                                                             +---------+---------------+---------+-----------+----------+-------------------+ GSV      Full           Yes      Yes                                      +---------+---------------+---------+-----------+----------+-------------------+   +----+---------------+---------+-----------+----------+--------------+ LEFTCompressibilityPhasicitySpontaneityPropertiesThrombus Aging +----+---------------+---------+-----------+----------+--------------+ CFV Full           Yes      Yes                                 +----+---------------+---------+-----------+----------+--------------+     Summary: RIGHT: - No evidence of deep vein thrombosis in the lower extremity. No indirect evidence of obstruction proximal to the inguinal ligament.  - No cystic structure found in the popliteal fossa.  LEFT: -  No evidence of common femoral vein obstruction.   *See table(s) above for measurements and observations.    Preliminary    DG HIP UNILAT WITH PELVIS 2-3 VIEWS LEFT Result Date: 02/10/2024 CLINICAL DATA:  Hip pain  EXAM: DG HIP (WITH OR WITHOUT PELVIS) 2-3V LEFT COMPARISON:  None Available. FINDINGS: No acute fracture or dislocation identified. There severe degenerative changes of the left hip with bone-on-bone configuration, sclerosis and osteophyte formation. There are mild-to-moderate degenerative changes of the right hip. There are severe degenerative changes of the lower lumbar spine. Contrast is seen within the bladder. IMPRESSION: 1. Severe degenerative changes of the left hip. 2. Mild-to-moderate degenerative changes of the right hip. Electronically Signed   By: Greig Pique M.D.   On: 02/10/2024 22:55   CT ABDOMEN PELVIS W CONTRAST Result Date: 02/10/2024 CLINICAL DATA:  Left lower quadrant pain EXAM: CT ABDOMEN AND PELVIS WITH CONTRAST TECHNIQUE: Multidetector CT imaging of the abdomen and pelvis was performed using the standard protocol following bolus administration of intravenous contrast. RADIATION DOSE REDUCTION: This exam was performed according to the departmental dose-optimization program which includes automated exposure control, adjustment of the mA and/or kV according to patient size and/or use of iterative reconstruction technique. CONTRAST:  OMNIPAQUE  IOHEXOL  300 MG/ML  SOLN COMPARISON:  CT abdomen and pelvis 12/06/2022 FINDINGS: Lower chest: No acute abnormality. Hepatobiliary: No focal liver abnormality is seen. Status post cholecystectomy. No biliary dilatation. Pancreas: Unremarkable. No pancreatic ductal dilatation or surrounding inflammatory changes. Spleen: Normal in size without focal abnormality. Adrenals/Urinary Tract: Adrenal glands are unremarkable. Kidneys are normal, without renal calculi, focal lesion, or hydronephrosis. Bladder is unremarkable. Stomach/Bowel: Stomach  is within normal limits. Appendix appears normal. No evidence of bowel wall thickening, distention, or inflammatory changes. Vascular/Lymphatic: Aortic atherosclerosis. No enlarged abdominal or pelvic lymph nodes. Reproductive: Status post hysterectomy. No adnexal masses. Other: No abdominal wall hernia or abnormality. No abdominopelvic ascites. Musculoskeletal: Advanced degenerative changes of the spine persists there severe degenerative changes of the left hip with bone-on-bone configuration, progressed compared to the prior study. Left hip joint effusion again seen IMPRESSION: 1. No acute localizing process in the abdomen or pelvis. 2. Increased severe degenerative changes of the left hip. Unchanged left hip joint effusion. Aortic Atherosclerosis (ICD10-I70.0). Electronically Signed   By: Greig Pique M.D.   On: 02/10/2024 20:28   DG Chest 2 View Result Date: 02/10/2024 CLINICAL DATA:  Cough. EXAM: CHEST - 2 VIEW COMPARISON:  September 10, 2023. FINDINGS: The heart size and mediastinal contours are within normal limits. Both lungs are clear. The visualized skeletal structures are unremarkable. IMPRESSION: No active cardiopulmonary disease. Electronically Signed   By: Lynwood Landy Raddle M.D.   On: 02/10/2024 18:18   Scheduled Meds:  ARIPiprazole   10 mg Oral Daily   atorvastatin   10 mg Oral Daily   budesonide -glycopyrrolate -formoterol   2 puff Inhalation BID   buPROPion   150 mg Oral Daily   enoxaparin  (LOVENOX ) injection  40 mg Subcutaneous Q24H   gabapentin   300 mg Oral BID   levothyroxine   150 mcg Oral Q0600   oxyCODONE -acetaminophen   1 tablet Oral BID   pantoprazole   40 mg Oral Daily   venlafaxine  XR  150 mg Oral QHS   Continuous Infusions:  cefTRIAXone  (ROCEPHIN )  IV 1 g (02/12/24 1151)     LOS: 1 day   Almarie KANDICE Hoots, MD  02/12/2024, 12:41 PM

## 2024-02-12 NOTE — Plan of Care (Signed)
  Problem: Education: Goal: Knowledge of General Education information will improve Description: Including pain rating scale, medication(s)/side effects and non-pharmacologic comfort measures Outcome: Progressing   Problem: Health Behavior/Discharge Planning: Goal: Ability to manage health-related needs will improve Outcome: Progressing   Problem: Clinical Measurements: Goal: Ability to maintain clinical measurements within normal limits will improve Outcome: Progressing Goal: Will remain free from infection Outcome: Progressing Goal: Diagnostic test results will improve Outcome: Progressing Goal: Respiratory complications will improve Outcome: Progressing Goal: Cardiovascular complication will be avoided Outcome: Progressing   Problem: Nutrition: Goal: Adequate nutrition will be maintained Outcome: Progressing   Problem: Coping: Goal: Level of anxiety will decrease Outcome: Progressing   Problem: Activity: Goal: Risk for activity intolerance will decrease Outcome: Progressing   Problem: Elimination: Goal: Will not experience complications related to bowel motility Outcome: Progressing Goal: Will not experience complications related to urinary retention Outcome: Progressing

## 2024-02-12 NOTE — Plan of Care (Signed)
  Problem: Education: Goal: Knowledge of General Education information will improve Description: Including pain rating scale, medication(s)/side effects and non-pharmacologic comfort measures Outcome: Progressing   Problem: Health Behavior/Discharge Planning: Goal: Ability to manage health-related needs will improve Outcome: Progressing   Problem: Clinical Measurements: Goal: Ability to maintain clinical measurements within normal limits will improve Outcome: Progressing Goal: Will remain free from infection Outcome: Progressing Goal: Diagnostic test results will improve Outcome: Progressing Goal: Respiratory complications will improve Outcome: Progressing Goal: Cardiovascular complication will be avoided Outcome: Progressing   Problem: Activity: Goal: Risk for activity intolerance will decrease Outcome: Progressing   Problem: Coping: Goal: Level of anxiety will decrease Outcome: Progressing   Problem: Pain Managment: Goal: General experience of comfort will improve and/or be controlled Outcome: Progressing   Problem: Safety: Goal: Ability to remain free from injury will improve Outcome: Progressing   Problem: Skin Integrity: Goal: Risk for impaired skin integrity will decrease Outcome: Progressing

## 2024-02-13 ENCOUNTER — Inpatient Hospital Stay (HOSPITAL_COMMUNITY)

## 2024-02-13 DIAGNOSIS — R627 Adult failure to thrive: Secondary | ICD-10-CM | POA: Diagnosis not present

## 2024-02-13 LAB — COMPREHENSIVE METABOLIC PANEL WITH GFR
ALT: 12 U/L (ref 0–44)
AST: 15 U/L (ref 15–41)
Albumin: 2.9 g/dL — ABNORMAL LOW (ref 3.5–5.0)
Alkaline Phosphatase: 126 U/L (ref 38–126)
Anion gap: 8 (ref 5–15)
BUN: 6 mg/dL — ABNORMAL LOW (ref 8–23)
CO2: 28 mmol/L (ref 22–32)
Calcium: 8.4 mg/dL — ABNORMAL LOW (ref 8.9–10.3)
Chloride: 103 mmol/L (ref 98–111)
Creatinine, Ser: 0.63 mg/dL (ref 0.44–1.00)
GFR, Estimated: >60 mL/min (ref >60)
Glucose, Bld: 101 mg/dL — ABNORMAL HIGH (ref 70–99)
Potassium: 3.5 mmol/L (ref 3.5–5.1)
Sodium: 139 mmol/L (ref 135–145)
Total Bilirubin: 0.6 mg/dL (ref 0.0–1.2)
Total Protein: 6 g/dL — ABNORMAL LOW (ref 6.5–8.1)

## 2024-02-13 LAB — URINE CULTURE: Culture: >=100000 — AB

## 2024-02-13 LAB — CBC
HCT: 43 % (ref 36.0–46.0)
Hemoglobin: 12.9 g/dL (ref 12.0–15.0)
MCH: 30.1 pg (ref 26.0–34.0)
MCHC: 30 g/dL (ref 30.0–36.0)
MCV: 100.2 fL — ABNORMAL HIGH (ref 80.0–100.0)
Platelets: 248 K/uL (ref 150–400)
RBC: 4.29 MIL/uL (ref 3.87–5.11)
RDW: 16.3 % — ABNORMAL HIGH (ref 11.5–15.5)
WBC: 7.3 K/uL (ref 4.0–10.5)
nRBC: 0 % (ref 0.0–0.2)

## 2024-02-13 MED ORDER — SODIUM CHLORIDE 0.9% FLUSH: 10.0000 mL | INTRAVENOUS | Status: DC | PRN

## 2024-02-13 NOTE — Plan of Care (Signed)
?  Problem: Clinical Measurements: ?Goal: Respiratory complications will improve ?Outcome: Progressing ?  ?Problem: Activity: ?Goal: Risk for activity intolerance will decrease ?Outcome: Progressing ?  ?Problem: Nutrition: ?Goal: Adequate nutrition will be maintained ?Outcome: Progressing ?  ?Problem: Coping: ?Goal: Level of anxiety will decrease ?Outcome: Progressing ?  ?

## 2024-02-13 NOTE — Progress Notes (Signed)
 Occupational Therapy Treatment Patient Details Name: [name] MRN: [id number] DOB: 29-Mar-1956 Today's Date: 02/13/2024   History of present illness Pt is a 68 y.o. female with medical history significant of COPD with chronic respiratory failure (wears 2L PRN), gout, HLD, HTN, hypothyroidism, chronic pain syndrome with impaired mobility, who presented to ED with complaints of abdominal pain and vomiting and diarrhea over the last 3 days. She has had chills and sweats and subjective fever. She states she also has had a headache. She states she has had vomiting maybe 4-5x/day and has had diarrhea all day long.   OT comments  Pt making progress with functional goals. Pt in bed upon arrival, pleasant. Pt sat EOB with Sup, increased time but no physical assist; pt initially agreeable to walk to bathroom but then changed her mind once standing due to L hip pain/discomfort, agreeable to transfer on/off BSC then to chair to participate in UB ADLs, grooming and hygiene tasks. OT will continue to follow acutely to maximize level of function and safety       If plan is discharge home, recommend the following:  A little help with walking and/or transfers;A little help with bathing/dressing/bathroom;Assistance with cooking/housework   Equipment Recommendations  None recommended by OT    Recommendations for Other Services      Precautions / Restrictions Precautions Precautions: Fall Restrictions Weight Bearing Restrictions Per Provider Order: No       Mobility Bed Mobility Overal bed mobility: Needs Assistance Bed Mobility: Supine to Sit     Supine to sit: Supervision, HOB elevated     General bed mobility comments: Increased time, HOB elevated, use of bed rail but no physical assist    Transfers Overall transfer level: Needs assistance Equipment used: Rollator (4 wheels) Transfers: Sit to/from Stand, Bed to chair/wheelchair/BSC Sit to Stand: Contact guard assist Stand pivot  transfers: Contact guard assist         General transfer comment: pt initially agreeable to walk to bathroom but then changed her mind once standing due to L hip pain/discomfort, agreeable to transfer on/off BSC then to chair     Balance Overall balance assessment: Needs assistance Sitting-balance support: No upper extremity supported Sitting balance-Leahy Scale: Good     Standing balance support: Bilateral upper extremity supported, During functional activity Standing balance-Leahy Scale: Poor                             ADL either performed or assessed with clinical judgement   ADL Overall ADL's : Needs assistance/impaired     Grooming: Wash/dry hands;Wash/dry face;Contact guard assist;Standing   Upper Body Bathing: Contact guard assist;Sitting       Upper Body Dressing : Contact guard assist;Sitting   Lower Body Dressing: Maximal assistance;Sit to/from stand;Cueing for safety;Cueing for sequencing   Toilet Transfer: Minimal assistance;Contact guard assist;Cueing for safety;Rollator (4 wheels);Stand-pivot;BSC/3in1   Toileting- Architect and Hygiene: Moderate assistance;Sit to/from stand       Functional mobility during ADLs: Minimal assistance;Contact guard assist;Cueing for safety;Rollator (4 wheels)      Extremity/Trunk Assessment Upper Extremity Assessment Upper Extremity Assessment: Generalized weakness   Lower Extremity Assessment Lower Extremity Assessment: Defer to PT evaluation        Vision Ability to See in Adequate Light: 0 Adequate Patient Visual Report: No change from baseline     Perception     Praxis     Communication Communication Communication: No apparent difficulties Factors  Affecting Communication: Hearing impaired   Cognition Arousal: Alert Behavior During Therapy: WFL for tasks assessed/performed                                 Following commands: Intact        Cueing   Cueing  Techniques: Verbal cues, Gestural cues  Exercises      Shoulder Instructions       General Comments      Pertinent Vitals/ Pain       Pain Assessment Pain Assessment: 0-10 Pain Score: 5  Pain Location: L hip Pain Descriptors / Indicators: Aching, Discomfort Pain Intervention(s): Limited activity within patient's tolerance, Monitored during session, Repositioned  Home Living                                          Prior Functioning/Environment              Frequency  Min 2X/week        Progress Toward Goals  OT Goals(current goals can now be found in the care plan section)  Progress towards OT goals: Progressing toward goals     Plan      Co-evaluation                 AM-PAC OT 6 Clicks Daily Activity     Outcome Measure   Help from another person eating meals?: None Help from another person taking care of personal grooming?: A Little Help from another person toileting, which includes using toliet, bedpan, or urinal?: A Lot Help from another person bathing (including washing, rinsing, drying)?: A Lot Help from another person to put on and taking off regular upper body clothing?: A Little Help from another person to put on and taking off regular lower body clothing?: A Lot 6 Click Score: 16    End of Session Equipment Utilized During Treatment: Gait belt;Rollator (4 wheels);Other (comment) (BSC)  OT Visit Diagnosis: Unsteadiness on feet (R26.81);Muscle weakness (generalized) (M62.81)   Activity Tolerance Patient limited by pain   Patient Left in chair;with call bell/phone within reach;with chair alarm set   Nurse Communication Mobility status        Time: 8870-8843 OT Time Calculation (min): 27 min  Charges: OT General Charges $OT Visit: 1 Visit OT Treatments $Self Care/Home Management : 8-22 mins $Therapeutic Activity: 8-22 mins    Jacques Karna Loose 02/13/2024, 1:39 PM

## 2024-02-13 NOTE — Progress Notes (Signed)
 PROGRESS NOTE    [name]  FMW:[id number] DOB: 07/01/1956 DOA: 02/10/2024 PCP: Shayne Anes, MD  Brief Narrative: 68 y.o. female with medical history significant of COPD with chronic respiratory failure (wears 2L PRN), gout, HLD, HTN, hypothyroidism, chronic pain syndrome with impaired mobility, who presented to ED with complaints of abdominal pain and vomiting and diarrhea over the last 3 days. She has had chills and sweats and subjective fever. She states she also has had a headache. She states she has had vomiting maybe 4-5x/day and has had diarrhea all day long. She also has increased urinary frequency, urgency and dysuria. She is unsure if her urine is a different color. It has a smell to it. Her abdominal is generalized. Rated as a 7/10 and described as sharp. Nothing makes it better or worse. She also feels weak and fatigued.  She did complain of a headache, no changes with her vision no chest pain shortness of breath or cough.  She lives at home alone.  Assessment & Plan:   Principal Problem:   Failure to thrive in adult Active Problems:   Diarrhea and vomiting   UTI (urinary tract infection)   Venous stasis dermatitis of both lower extremities/+/- cellulitis fo RLL   Left hip pain   Hypokalemia   COPD witih chronic respiratory failure   Chronic diastolic CHF (congestive heart failure) (HCC)   Acquired hypothyroidism   Chronic pain syndrome   Anxiety and depression   Hyperlipidemia   GERD (gastroesophageal reflux disease)  #1 Viral gastroenteritis patient admitted with nausea vomiting diarrhea and abdominal pain with generalized weakness and fatigue with poor p.o. intake. She complains of nausea still.advance diet TO REGULAR. DC IVF  CT abdomen pelvis with no acute findings.  #2 urinary tract infection patient presented with nausea and vomiting and abdominal pain dysuria frequency and urgency.  UA was positive for UTI.  Urine culture more than 100,000 colonies of  gram-negative rods sensitivity pending identification pending.  Continue Rocephin .  #3 mild right lower extremity cellulitis improving on Rocephin  continue.  She has chronic stasis dermatitis of bilateral lower extremities. Venous Doppler no dvt  #4 left hip pain she denies history of falls. X-ray of the left hip severe degenerative changes of the left hip, mild to moderate degenerative changes of the right hip Consult physical therapy  #5 history of COPD on as needed oxygen prior to admission continue home inhalers  #6 history of diastolic heart failure patient appears on the dry side.  Continue IV fluids with caution.  #7 hypothyroidism continue Synthroid  TSH 4.7  #8 chronic pain continue gabapentin  and Percocet  #9 anxiety depression continue Xanax  Effexor  and Wellbutrin  as needed  #10 GERD on Protonix   #11 hyperlipidemia on Lipitor  #12 hypokalemia repleted potassium 3.5 magnesium  2.0   Estimated body mass index is 35.19 kg/m as calculated from the following:   Height as of this encounter: 5' 4 (1.626 m).   Weight as of this encounter: 93 kg.  DVT prophylaxis: Lovenox  Code Status: Full code  family Communication: None  disposition Plan:  Status is: Observation   Consultants:  none  Procedures:  Antimicrobials:  Subjective:   C/O pain left inguinal area few days  No falls reported  Xrays ordered  Objective: Vitals:   02/12/24 1900 02/12/24 2114 02/13/24 0402 02/13/24 0911  BP:  (!) 118/59 (!) 114/47   Pulse:  82 82   Resp:  18 18   Temp:  98.4 F (36.9 C) 98.5 F (  36.9 C)   TempSrc:  Oral Oral   SpO2: 99% 96% 92% 93%  Weight:      Height:        Intake/Output Summary (Last 24 hours) at 02/13/2024 1054 Last data filed at 02/13/2024 0900 Gross per 24 hour  Intake 800 ml  Output 4350 ml  Net -3550 ml   Filed Weights   02/10/24 2338  Weight: 93 kg    Examination:  General exam: Appears in no acute distress Respiratory system: Clear to  auscultation. Respiratory effort normal. Cardiovascular system: S1 & S2 heard, RRR. No JVD, murmurs, rubs, gallops or clicks. No pedal edema. Gastrointestinal system: Abdomen is nondistended, soft and nontender. No organomegaly or masses felt. Normal bowel sounds heard. Central nervous system: Alert and oriented. No focal neurological deficits. Extremities: Bilateral lower extremity edema right more than left mild erythema to the right leg Tender left groin     Data Reviewed: I have personally reviewed following labs and imaging studies  CBC: Recent Labs  Lab 02/10/24 1649 02/11/24 0732 02/13/24 0544  WBC 9.0 8.8 7.3  HGB 14.1 13.3 12.9  HCT 45.0 44.0 43.0  MCV 95.7 99.3 100.2*  PLT 323 292 248   Basic Metabolic Panel: Recent Labs  Lab 02/10/24 1649 02/10/24 2245 02/11/24 0732 02/13/24 0544  NA 141  --  142 139  K 3.1*  --  3.5 3.5  CL 105  --  106 103  CO2 26  --  27 28  GLUCOSE 86  --  83 101*  BUN <5*  --  <5* 6*  CREATININE 0.72  --  0.63 0.63  CALCIUM  9.2  --  8.6* 8.4*  MG  --  2.0  --   --    GFR: Estimated Creatinine Clearance: 75.4 mL/min (by C-G formula based on SCr of 0.63 mg/dL). Liver Function Tests: Recent Labs  Lab 02/10/24 1649 02/13/24 0544  AST 13* 15  ALT 10 12  ALKPHOS 146* 126  BILITOT 0.9 0.6  PROT 7.2 6.0*  ALBUMIN  3.6 2.9*   Recent Labs  Lab 02/10/24 1649  LIPASE 20   No results for input(s): AMMONIA in the last 168 hours. Coagulation Profile: No results for input(s): INR, PROTIME in the last 168 hours. Cardiac Enzymes: No results for input(s): CKTOTAL, CKMB, CKMBINDEX, TROPONINI in the last 168 hours. BNP (last 3 results) No results for input(s): PROBNP in the last 8760 hours. HbA1C: No results for input(s): HGBA1C in the last 72 hours. CBG: No results for input(s): GLUCAP in the last 168 hours. Lipid Profile: No results for input(s): CHOL, HDL, LDLCALC, TRIG, CHOLHDL, LDLDIRECT in the last  72 hours. Thyroid  Function Tests: Recent Labs    02/10/24 2244  TSH 4.715*   Anemia Panel: Recent Labs    02/10/24 2244  VITAMINB12 289   Sepsis Labs: Recent Labs  Lab 02/10/24 1855  LATICACIDVEN 0.9    Recent Results (from the past 240 hours)  Resp panel by RT-PCR (RSV, Flu A&B, Covid) Anterior Nasal Swab     Status: None   Collection Time: 02/10/24  5:46 PM   Specimen: Anterior Nasal Swab  Result Value Ref Range Status   SARS Coronavirus 2 by RT PCR NEGATIVE NEGATIVE Final    Comment: (NOTE) SARS-CoV-2 target nucleic acids are NOT DETECTED.  The SARS-CoV-2 RNA is generally detectable in upper respiratory specimens during the acute phase of infection. The lowest concentration of SARS-CoV-2 viral copies this assay can detect is 138 copies/mL. A  negative result does not preclude SARS-Cov-2 infection and should not be used as the sole basis for treatment or other patient management decisions. A negative result may occur with  improper specimen collection/handling, submission of specimen other than nasopharyngeal swab, presence of viral mutation(s) within the areas targeted by this assay, and inadequate number of viral copies(<138 copies/mL). A negative result must be combined with clinical observations, patient history, and epidemiological information. The expected result is Negative.  Fact Sheet for Patients:  BloggerCourse.com  Fact Sheet for Healthcare Providers:  SeriousBroker.it  This test is no t yet approved or cleared by the [place]  FDA and  has been authorized for detection and/or diagnosis of SARS-CoV-2 by FDA under an Emergency Use Authorization (EUA). This EUA will remain  in effect (meaning this test can be used) for the duration of the COVID-19 declaration under Section 564(b)(1) of the Act, 21 U.S.C.section 360bbb-3(b)(1), unless the authorization is terminated  or revoked sooner.        Influenza A by PCR NEGATIVE NEGATIVE Final   Influenza B by PCR NEGATIVE NEGATIVE Final    Comment: (NOTE) The Xpert Xpress SARS-CoV-2/FLU/RSV plus assay is intended as an aid in the diagnosis of influenza from Nasopharyngeal swab specimens and should not be used as a sole basis for treatment. Nasal washings and aspirates are unacceptable for Xpert Xpress SARS-CoV-2/FLU/RSV testing.  Fact Sheet for Patients: BloggerCourse.com  Fact Sheet for Healthcare Providers: SeriousBroker.it  This test is not yet approved or cleared by the [place]  FDA and has been authorized for detection and/or diagnosis of SARS-CoV-2 by FDA under an Emergency Use Authorization (EUA). This EUA will remain in effect (meaning this test can be used) for the duration of the COVID-19 declaration under Section 564(b)(1) of the Act, 21 U.S.C. section 360bbb-3(b)(1), unless the authorization is terminated or revoked.     Resp Syncytial Virus by PCR NEGATIVE NEGATIVE Final    Comment: (NOTE) Fact Sheet for Patients: BloggerCourse.com  Fact Sheet for Healthcare Providers: SeriousBroker.it  This test is not yet approved or cleared by the [place]  FDA and has been authorized for detection and/or diagnosis of SARS-CoV-2 by FDA under an Emergency Use Authorization (EUA). This EUA will remain in effect (meaning this test can be used) for the duration of the COVID-19 declaration under Section 564(b)(1) of the Act, 21 U.S.C. section 360bbb-3(b)(1), unless the authorization is terminated or revoked.  Performed at [facility], [address], [place], [place] [address]   Blood culture (routine x 2)     Status: None (Preliminary result)   Collection Time: 02/10/24  6:45 PM   Specimen: BLOOD RIGHT FOREARM  Result Value Ref Range Status   Specimen Description   Final    BLOOD RIGHT  FOREARM Performed at [facility], [address] [place], [place] [address]    Special Requests   Final    BOTTLES DRAWN AEROBIC AND ANAEROBIC Blood Culture adequate volume Performed at [facility], [address], [place], [place] [address]    Culture   Final    NO GROWTH 3 DAYS Performed at [facility], [address], [place], [place] [address]    Report Status PENDING  Incomplete  Blood culture (routine x 2)     Status: None (Preliminary result)   Collection Time: 02/10/24  7:04 PM   Specimen: BLOOD  Result Value Ref Range Status   Specimen Description   Final    BLOOD LEFT  ANTECUBITAL Performed at [facility], [address] [place], [place] [address]    Special Requests   Final    BOTTLES DRAWN AEROBIC AND ANAEROBIC Blood Culture adequate volume Performed at [facility], [address] [place], [place] [address]    Culture   Final    NO GROWTH 3 DAYS Performed at [facility], [address], [place], [place] [address]    Report Status PENDING  Incomplete  Urine Culture (for pregnant, neutropenic or urologic patients or patients with an indwelling urinary catheter)     Status: Abnormal   Collection Time: 02/10/24 11:16 PM   Specimen: Urine, Clean Catch  Result Value Ref Range Status   Specimen Description   Final    URINE, CLEAN CATCH Performed at [facility], [address] [place], [place] [address]    Special Requests   Final    NONE Performed at [facility], [address] [place], [place] [address]    Culture >=100,000 COLONIES/mL ESCHERICHIA COLI (A)  Final   Report Status 02/13/2024 FINAL  Final   Organism ID, Bacteria ESCHERICHIA COLI (A)  Final      Susceptibility   Escherichia coli - MIC*    AMPICILLIN  >=32 RESISTANT Resistant     CEFAZOLIN <=4 SENSITIVE Sensitive     CEFEPIME  <=0.12 SENSITIVE Sensitive     CEFTRIAXONE  <=0.25  SENSITIVE Sensitive     CIPROFLOXACIN  <=0.25 SENSITIVE Sensitive     GENTAMICIN <=1 SENSITIVE Sensitive     IMIPENEM <=0.25 SENSITIVE Sensitive     NITROFURANTOIN <=16 SENSITIVE Sensitive     TRIMETH/SULFA <=20 SENSITIVE Sensitive     AMPICILLIN /SULBACTAM 16 INTERMEDIATE Intermediate     PIP/TAZO <=4 SENSITIVE Sensitive ug/mL    * >=100,000 COLONIES/mL ESCHERICHIA COLI  MRSA Next Gen by PCR, Nasal     Status: None   Collection Time: 02/11/24 12:29 AM   Specimen: Nasal Mucosa; Nasal Swab  Result Value Ref Range Status   MRSA by PCR Next Gen NOT DETECTED NOT DETECTED Final    Comment: (NOTE) The GeneXpert MRSA Assay (FDA approved for NASAL specimens only), is one component of a comprehensive MRSA colonization surveillance program. It is not intended to diagnose MRSA infection nor to guide or monitor treatment for MRSA infections. Test performance is not FDA approved in patients less than 36 years old. Performed at [facility], [address], [place], [place] [address]          Radiology Studies: VAS US  LOWER EXTREMITY VENOUS (DVT) Result Date: 02/12/2024  Lower Venous DVT Study Patient Name:  [name]  Date of Exam:   02/11/2024 Medical Rec #: [id number]      Accession #:    [id number] Date of Birth: 1956/07/20      Patient Gender: F Patient Age:   61 years Exam Location:  [facility] Procedure:      VAS US  LOWER EXTREMITY VENOUS (DVT) Referring Phys: ISAIAH GERALDS --------------------------------------------------------------------------------  Indications: Edema.  Comparison Study: 02/28/2021 Normal Bilateral Performing Technologist: Orvil Holmes RDCS  Examination Guidelines: A complete evaluation includes B-mode imaging, spectral Doppler, color Doppler, and power Doppler as needed of all accessible portions of each vessel. Bilateral testing is considered an integral part of a complete examination. Limited examinations for reoccurring indications may  be performed as noted. The reflux portion of the exam is performed with the patient in reverse Trendelenburg.  +---------+---------------+---------+-----------+----------+-------------------+ RIGHT  CompressibilityPhasicitySpontaneityPropertiesThrombus Aging      +---------+---------------+---------+-----------+----------+-------------------+ CFV      Full           Yes      Yes                                      +---------+---------------+---------+-----------+----------+-------------------+ SFJ      Full           Yes      Yes                                      +---------+---------------+---------+-----------+----------+-------------------+ FV Prox  Full           Yes      Yes                                      +---------+---------------+---------+-----------+----------+-------------------+ FV Mid   Full           Yes      Yes                                      +---------+---------------+---------+-----------+----------+-------------------+ FV DistalFull           Yes      Yes                                      +---------+---------------+---------+-----------+----------+-------------------+ PFV      Full                                                             +---------+---------------+---------+-----------+----------+-------------------+ POP      Full           Yes      Yes                                      +---------+---------------+---------+-----------+----------+-------------------+ PTV      Full           Yes      Yes                  Not well visualized +---------+---------------+---------+-----------+----------+-------------------+ PERO     Full           Yes      Yes                  Not well visualized +---------+---------------+---------+-----------+----------+-------------------+ Gastroc  Full                                                              +---------+---------------+---------+-----------+----------+-------------------+ GSV      Full  Yes      Yes                                      +---------+---------------+---------+-----------+----------+-------------------+   +----+---------------+---------+-----------+----------+--------------+ LEFTCompressibilityPhasicitySpontaneityPropertiesThrombus Aging +----+---------------+---------+-----------+----------+--------------+ CFV Full           Yes      Yes                                 +----+---------------+---------+-----------+----------+--------------+    Summary: RIGHT: - No evidence of deep vein thrombosis in the lower extremity. No indirect evidence of obstruction proximal to the inguinal ligament.  - No cystic structure found in the popliteal fossa.  LEFT: - No evidence of common femoral vein obstruction.   *See table(s) above for measurements and observations. Electronically signed by Gaile New MD on 02/12/2024 at 2:07:19 PM.    Final    Scheduled Meds:  ARIPiprazole   10 mg Oral Daily   atorvastatin   10 mg Oral Daily   budesonide -glycopyrrolate -formoterol   2 puff Inhalation BID   buPROPion   150 mg Oral Daily   enoxaparin  (LOVENOX ) injection  40 mg Subcutaneous Q24H   gabapentin   300 mg Oral BID   levothyroxine   150 mcg Oral Q0600   oxyCODONE -acetaminophen   1 tablet Oral BID   pantoprazole   40 mg Oral Daily   venlafaxine  XR  150 mg Oral QHS   Continuous Infusions:  cefTRIAXone  (ROCEPHIN )  IV 1 g (02/13/24 0944)     LOS: 2 days   Almarie KANDICE Hoots, MD  02/13/2024, 10:54 AM

## 2024-02-13 NOTE — Plan of Care (Signed)
  Problem: Clinical Measurements: Goal: Diagnostic test results will improve Outcome: Progressing   Problem: Pain Managment: Goal: General experience of comfort will improve and/or be controlled Outcome: Progressing   Problem: Skin Integrity: Goal: Risk for impaired skin integrity will decrease Outcome: Progressing

## 2024-02-14 ENCOUNTER — Other Ambulatory Visit (HOSPITAL_COMMUNITY): Payer: Self-pay

## 2024-02-14 DIAGNOSIS — R627 Adult failure to thrive: Secondary | ICD-10-CM | POA: Diagnosis not present

## 2024-02-14 MED ORDER — ACETAMINOPHEN 325 MG PO TABS: 650.0000 mg | ORAL_TABLET | Freq: Four times a day (QID) | ORAL | Status: AC | PRN

## 2024-02-14 MED ORDER — CEPHALEXIN 500 MG PO CAPS
500.0000 mg | ORAL_CAPSULE | Freq: Four times a day (QID) | ORAL | 0 refills | Status: AC
Start: 2024-02-14 — End: ?
  Filled 2024-02-14: qty 16, 4d supply, fill #0

## 2024-02-14 NOTE — Progress Notes (Signed)
 Discharge medications delivered to patient at bedside D Alieu Finnigan RN

## 2024-02-14 NOTE — Plan of Care (Signed)

## 2024-02-14 NOTE — Discharge Summary (Signed)
 Physician Discharge Summary  [name] FMW:[id number] DOB: 11/10/1955 DOA: 02/10/2024  PCP: Shayne Anes, MD  Admit date: 02/10/2024 Discharge date: 02/14/2024  Admitted From: home Disposition: home Recommendations for Outpatient Follow-up:  Follow up with PCP in 1-2 weeks Please obtain BMP/CBC in one week  Home Health:pt Equipment/Devices:none Discharge Condition:stable CODE STATUS:full Diet recommendation: cardiac  Brief/Interim Summary:68 y.o. female with medical history significant of COPD with chronic respiratory failure (wears 2L PRN), gout, HLD, HTN, hypothyroidism, chronic pain syndrome with impaired mobility, who presented to ED with complaints of abdominal pain and vomiting and diarrhea over the last 3 days. She has had chills and sweats and subjective fever. She states she also has had a headache. She states she has had vomiting maybe 4-5x/day and has had diarrhea all day long. She also has increased urinary frequency, urgency and dysuria. She is unsure if her urine is a different color. It has a smell to it. Her abdominal is generalized. Rated as a 7/10 and described as sharp. Nothing makes it better or worse. She also feels weak and fatigued.  She did complain of a headache, no changes with her vision no chest pain shortness of breath or cough.  She lives at home alone.    Discharge Diagnoses:  Principal Problem:   Failure to thrive in adult Active Problems:   Diarrhea and vomiting   UTI (urinary tract infection)   Venous stasis dermatitis of both lower extremities/+/- cellulitis fo RLL   Left hip pain   Hypokalemia   COPD witih chronic respiratory failure   Chronic diastolic CHF (congestive heart failure) (HCC)   Acquired hypothyroidism   Chronic pain syndrome   Anxiety and depression   Hyperlipidemia   GERD (gastroesophageal reflux disease)    #1 Viral gastroenteritis patient admitted with nausea vomiting diarrhea and abdominal pain with generalized weakness  and fatigue with poor p.o. intake. CT abdomen pelvis with no acute findings.treated with ivf and supportive measures.   #2 urinary tract infection patient presented with nausea and vomiting and abdominal pain dysuria frequency and urgency.  UA was positive for UTI.  Urine culture more than 100,000 colonies of e coli.she was treated with rocephin .discharged on keflex .   #3 mild right lower extremity cellulitis improved.  She has chronic stasis dermatitis of bilateral lower extremities. Venous Doppler no dvt Dc on keflex    #4 left hip pain she denies history of falls. X-ray of the left hip severe degenerative changes of the left hip, mild to moderate degenerative changes of the right hip   #5 history of COPD on as needed oxygen prior to admission continue home inhalers   #6 history of diastolic heart failure patient appears on the dry side.  Treated with ivf.   #7 hypothyroidism continue Synthroid  TSH 4.7   #8 chronic pain continue gabapentin  and Percocet   #9 anxiety depression continue Xanax  Effexor  and Wellbutrin    #10 GERD on Protonix    #11 hyperlipidemia on Lipitor   #12 hypokalemia repleted potassium 3.5 magnesium  2.0   Estimated body mass index is 35.19 kg/m as calculated from the following:   Height as of this encounter: 5' 4 (1.626 m).   Weight as of this encounter: 93 kg.  Discharge Instructions  Discharge Instructions     Diet - low sodium heart healthy   Complete by: As directed    Increase activity slowly   Complete by: As directed       Allergies as of 02/14/2024  Reactions   Gemfibrozil Other (See Comments)   Sick to stomach   Nitroglycerin Other (See Comments)    Cough, Irritation   Other Other (See Comments)   Grape fruit juice - unknown   Pravastatin Other (See Comments)   Dizzy   Sulfamethoxazole-trimethoprim Nausea And Vomiting   Tyloxapol Other (See Comments)    Does not work        Medication List     STOP taking these  medications    esomeprazole 40 MG capsule Commonly known as: NEXIUM   hydrocortisone  cream 0.5 %   ipratropium-albuterol  0.5-2.5 (3) MG/3ML Soln Commonly known as: DUONEB   lidocaine  5 % Commonly known as: Lidoderm        TAKE these medications    acetaminophen  325 MG tablet Commonly known as: TYLENOL  Take 2 tablets (650 mg total) by mouth every 6 (six) hours as needed for mild pain (pain score 1-3) or fever (or Fever >/= 101).   ALPRAZolam  1 MG tablet Commonly known as: XANAX  Take 1 mg by mouth 3 (three) times daily as needed for anxiety.   ARIPiprazole  10 MG tablet Commonly known as: ABILIFY  Take 10 mg by mouth daily.   atorvastatin  10 MG tablet Commonly known as: LIPITOR Take 10 mg by mouth daily.   Breztri  Aerosphere 160-9-4.8 MCG/ACT Aero inhaler Generic drug: budesonide -glycopyrrolate -formoterol  Inhale 2 puffs into the lungs in the morning and at bedtime.   buPROPion  150 MG 24 hr tablet Commonly known as: WELLBUTRIN  XL Take 150 mg by mouth daily.   cephALEXin  500 MG capsule Commonly known as: KEFLEX  Take 1 capsule (500 mg total) by mouth 4 (four) times daily.   cetirizine 10 MG tablet Commonly known as: ZYRTEC Take 10 mg by mouth at bedtime.   gabapentin  300 MG capsule Commonly known as: NEURONTIN  Take 300 mg by mouth 2 (two) times daily.   levothyroxine  150 MCG tablet Commonly known as: SYNTHROID  Take 150 mcg by mouth daily before breakfast.   oxyCODONE -acetaminophen  5-325 MG tablet Commonly known as: PERCOCET/ROXICET Take 1 tablet by mouth every 6 (six) hours as needed for severe pain (pain score 7-10). What changed: when to take this   pantoprazole  40 MG tablet Commonly known as: PROTONIX  Take 40 mg by mouth daily.   venlafaxine  XR 150 MG 24 hr capsule Commonly known as: EFFEXOR -XR Take 150 mg by mouth at bedtime.        Follow-up Information     Shayne Anes, MD Follow up.   Specialty: Internal Medicine Contact information: [address] [place] [address] [phone]                Allergies  Allergen Reactions   Gemfibrozil Other (See Comments)    Sick to stomach   Nitroglycerin Other (See Comments)     Cough, Irritation   Other Other (See Comments)    Grape fruit juice - unknown   Pravastatin Other (See Comments)    Dizzy   Sulfamethoxazole-Trimethoprim Nausea And Vomiting   Tyloxapol Other (See Comments)     Does not work    Consultations:none  Procedures/Studies: DG HIP UNILAT WITH PELVIS 2-3 VIEWS LEFT Result Date: 02/13/2024 CLINICAL DATA:  Left and right hip pain EXAM: DG HIP (WITH OR WITHOUT PELVIS) 2-3V LEFT; DG HIP (WITH OR WITHOUT PELVIS) 2-3V RIGHT COMPARISON:  Same day hip radiographs and prior studies FINDINGS: Osteopenia. Severe left hip joint space narrowing, endplate osteophytosis, and subchondral sclerosis. Mild-to-moderate right hip joint space narrowing and osteophytosis. No acute fractures  are identified. Degenerative changes in the partially imaged lower lumbar spine. IMPRESSION: Severe left and mild to moderate right hip joint osteoarthrosis. Electronically Signed   By: Michaeline Blanch M.D.   On: 02/13/2024 11:16   DG HIP UNILAT WITH PELVIS 2-3 VIEWS RIGHT Result Date: 02/13/2024 CLINICAL DATA:  Left and right hip pain EXAM: DG HIP (WITH OR WITHOUT PELVIS) 2-3V LEFT; DG HIP (WITH OR WITHOUT PELVIS) 2-3V RIGHT COMPARISON:  Same day hip radiographs and prior studies FINDINGS: Osteopenia. Severe left hip joint space narrowing, endplate osteophytosis, and subchondral sclerosis. Mild-to-moderate right hip joint space narrowing and osteophytosis. No acute fractures are identified. Degenerative changes in the partially imaged lower lumbar spine. IMPRESSION: Severe left and mild to moderate right hip joint osteoarthrosis. Electronically Signed   By: Michaeline Blanch M.D.   On: 02/13/2024 11:16   VAS US  LOWER EXTREMITY VENOUS (DVT) Result Date: 02/12/2024  Lower Venous DVT Study Patient  Name:  [name]  Date of Exam:   02/11/2024 Medical Rec #: [id number]      Accession #:    [id number] Date of Birth: April 05, 1956      Patient Gender: F Patient Age:   64 years Exam Location:  [facility] Procedure:      VAS US  LOWER EXTREMITY VENOUS (DVT) Referring Phys: ISAIAH GERALDS --------------------------------------------------------------------------------  Indications: Edema.  Comparison Study: 02/28/2021 Normal Bilateral Performing Technologist: Orvil Holmes RDCS  Examination Guidelines: A complete evaluation includes B-mode imaging, spectral Doppler, color Doppler, and power Doppler as needed of all accessible portions of each vessel. Bilateral testing is considered an integral part of a complete examination. Limited examinations for reoccurring indications may be performed as noted. The reflux portion of the exam is performed with the patient in reverse Trendelenburg.  +---------+---------------+---------+-----------+----------+-------------------+ RIGHT    CompressibilityPhasicitySpontaneityPropertiesThrombus Aging      +---------+---------------+---------+-----------+----------+-------------------+ CFV      Full           Yes      Yes                                      +---------+---------------+---------+-----------+----------+-------------------+ SFJ      Full           Yes      Yes                                      +---------+---------------+---------+-----------+----------+-------------------+ FV Prox  Full           Yes      Yes                                      +---------+---------------+---------+-----------+----------+-------------------+ FV Mid   Full           Yes      Yes                                      +---------+---------------+---------+-----------+----------+-------------------+ FV DistalFull           Yes      Yes                                       +---------+---------------+---------+-----------+----------+-------------------+  PFV      Full                                                             +---------+---------------+---------+-----------+----------+-------------------+ POP      Full           Yes      Yes                                      +---------+---------------+---------+-----------+----------+-------------------+ PTV      Full           Yes      Yes                  Not well visualized +---------+---------------+---------+-----------+----------+-------------------+ PERO     Full           Yes      Yes                  Not well visualized +---------+---------------+---------+-----------+----------+-------------------+ Gastroc  Full                                                             +---------+---------------+---------+-----------+----------+-------------------+ GSV      Full           Yes      Yes                                      +---------+---------------+---------+-----------+----------+-------------------+   +----+---------------+---------+-----------+----------+--------------+ LEFTCompressibilityPhasicitySpontaneityPropertiesThrombus Aging +----+---------------+---------+-----------+----------+--------------+ CFV Full           Yes      Yes                                 +----+---------------+---------+-----------+----------+--------------+    Summary: RIGHT: - No evidence of deep vein thrombosis in the lower extremity. No indirect evidence of obstruction proximal to the inguinal ligament.  - No cystic structure found in the popliteal fossa.  LEFT: - No evidence of common femoral vein obstruction.   *See table(s) above for measurements and observations. Electronically signed by Gaile New MD on 02/12/2024 at 2:07:19 PM.    Final    DG HIP UNILAT WITH PELVIS 2-3 VIEWS LEFT Result Date: 02/10/2024 CLINICAL DATA:  Hip pain EXAM: DG HIP (WITH OR WITHOUT PELVIS) 2-3V  LEFT COMPARISON:  None Available. FINDINGS: No acute fracture or dislocation identified. There severe degenerative changes of the left hip with bone-on-bone configuration, sclerosis and osteophyte formation. There are mild-to-moderate degenerative changes of the right hip. There are severe degenerative changes of the lower lumbar spine. Contrast is seen within the bladder. IMPRESSION: 1. Severe degenerative changes of the left hip. 2. Mild-to-moderate degenerative changes of the right hip. Electronically Signed   By: Greig Pique M.D.   On: 02/10/2024 22:55   CT ABDOMEN PELVIS W CONTRAST Result Date: 02/10/2024 CLINICAL DATA:  Left lower quadrant  pain EXAM: CT ABDOMEN AND PELVIS WITH CONTRAST TECHNIQUE: Multidetector CT imaging of the abdomen and pelvis was performed using the standard protocol following bolus administration of intravenous contrast. RADIATION DOSE REDUCTION: This exam was performed according to the departmental dose-optimization program which includes automated exposure control, adjustment of the mA and/or kV according to patient size and/or use of iterative reconstruction technique. CONTRAST:  OMNIPAQUE  IOHEXOL  300 MG/ML  SOLN COMPARISON:  CT abdomen and pelvis 12/06/2022 FINDINGS: Lower chest: No acute abnormality. Hepatobiliary: No focal liver abnormality is seen. Status post cholecystectomy. No biliary dilatation. Pancreas: Unremarkable. No pancreatic ductal dilatation or surrounding inflammatory changes. Spleen: Normal in size without focal abnormality. Adrenals/Urinary Tract: Adrenal glands are unremarkable. Kidneys are normal, without renal calculi, focal lesion, or hydronephrosis. Bladder is unremarkable. Stomach/Bowel: Stomach is within normal limits. Appendix appears normal. No evidence of bowel wall thickening, distention, or inflammatory changes. Vascular/Lymphatic: Aortic atherosclerosis. No enlarged abdominal or pelvic lymph nodes. Reproductive: Status post hysterectomy. No  adnexal masses. Other: No abdominal wall hernia or abnormality. No abdominopelvic ascites. Musculoskeletal: Advanced degenerative changes of the spine persists there severe degenerative changes of the left hip with bone-on-bone configuration, progressed compared to the prior study. Left hip joint effusion again seen IMPRESSION: 1. No acute localizing process in the abdomen or pelvis. 2. Increased severe degenerative changes of the left hip. Unchanged left hip joint effusion. Aortic Atherosclerosis (ICD10-I70.0). Electronically Signed   By: Greig Pique M.D.   On: 02/10/2024 20:28   DG Chest 2 View Result Date: 02/10/2024 CLINICAL DATA:  Cough. EXAM: CHEST - 2 VIEW COMPARISON:  September 10, 2023. FINDINGS: The heart size and mediastinal contours are within normal limits. Both lungs are clear. The visualized skeletal structures are unremarkable. IMPRESSION: No active cardiopulmonary disease. Electronically Signed   By: Lynwood Landy Raddle M.D.   On: 02/10/2024 18:18   (Echo, Carotid, EGD, Colonoscopy, ERCP)    Subjective:  Eating ok no nausea diarrhea  Discharge Exam: Vitals:   02/14/24 0428 02/14/24 0807  BP: (!) 119/52   Pulse: 73   Resp: 18   Temp: 98.2 F (36.8 C)   SpO2: 96% 94%   Vitals:   02/13/24 2025 02/13/24 2052 02/14/24 0428 02/14/24 0807  BP:  (!) 115/58 (!) 119/52   Pulse:  72 73   Resp:  18 18   Temp:  98.9 F (37.2 C) 98.2 F (36.8 C)   TempSrc:  Oral Oral   SpO2: 94% 92% 96% 94%  Weight:      Height:        General: Pt is alert, awake, not in acute distress Cardiovascular: RRR, S1/S2 +, no rubs, no gallops Respiratory: CTA bilaterally, no wheezing, no rhonchi Abdominal: Soft, NT, ND, bowel sounds + Extremities: no edema, no cyanosis    The results of significant diagnostics from this hospitalization (including imaging, microbiology, ancillary and laboratory) are listed below for reference.     Microbiology: Recent Results (from the past 240 hours)  Resp  panel by RT-PCR (RSV, Flu A&B, Covid) Anterior Nasal Swab     Status: None   Collection Time: 02/10/24  5:46 PM   Specimen: Anterior Nasal Swab  Result Value Ref Range Status   SARS Coronavirus 2 by RT PCR NEGATIVE NEGATIVE Final    Comment: (NOTE) SARS-CoV-2 target nucleic acids are NOT DETECTED.  The SARS-CoV-2 RNA is generally detectable in upper respiratory specimens during the acute phase of infection. The lowest concentration of SARS-CoV-2 viral copies this assay can  detect is 138 copies/mL. A negative result does not preclude SARS-Cov-2 infection and should not be used as the sole basis for treatment or other patient management decisions. A negative result may occur with  improper specimen collection/handling, submission of specimen other than nasopharyngeal swab, presence of viral mutation(s) within the areas targeted by this assay, and inadequate number of viral copies(<138 copies/mL). A negative result must be combined with clinical observations, patient history, and epidemiological information. The expected result is Negative.  Fact Sheet for Patients:  BloggerCourse.com  Fact Sheet for Healthcare Providers:  SeriousBroker.it  This test is no t yet approved or cleared by the [place]  FDA and  has been authorized for detection and/or diagnosis of SARS-CoV-2 by FDA under an Emergency Use Authorization (EUA). This EUA will remain  in effect (meaning this test can be used) for the duration of the COVID-19 declaration under Section 564(b)(1) of the Act, 21 U.S.C.section 360bbb-3(b)(1), unless the authorization is terminated  or revoked sooner.       Influenza A by PCR NEGATIVE NEGATIVE Final   Influenza B by PCR NEGATIVE NEGATIVE Final    Comment: (NOTE) The Xpert Xpress SARS-CoV-2/FLU/RSV plus assay is intended as an aid in the diagnosis of influenza from Nasopharyngeal swab specimens and should not be used as a  sole basis for treatment. Nasal washings and aspirates are unacceptable for Xpert Xpress SARS-CoV-2/FLU/RSV testing.  Fact Sheet for Patients: BloggerCourse.com  Fact Sheet for Healthcare Providers: SeriousBroker.it  This test is not yet approved or cleared by the [place]  FDA and has been authorized for detection and/or diagnosis of SARS-CoV-2 by FDA under an Emergency Use Authorization (EUA). This EUA will remain in effect (meaning this test can be used) for the duration of the COVID-19 declaration under Section 564(b)(1) of the Act, 21 U.S.C. section 360bbb-3(b)(1), unless the authorization is terminated or revoked.     Resp Syncytial Virus by PCR NEGATIVE NEGATIVE Final    Comment: (NOTE) Fact Sheet for Patients: BloggerCourse.com  Fact Sheet for Healthcare Providers: SeriousBroker.it  This test is not yet approved or cleared by the [place]  FDA and has been authorized for detection and/or diagnosis of SARS-CoV-2 by FDA under an Emergency Use Authorization (EUA). This EUA will remain in effect (meaning this test can be used) for the duration of the COVID-19 declaration under Section 564(b)(1) of the Act, 21 U.S.C. section 360bbb-3(b)(1), unless the authorization is terminated or revoked.  Performed at [facility], [address], [place], [place] [address]   Blood culture (routine x 2)     Status: None (Preliminary result)   Collection Time: 02/10/24  6:45 PM   Specimen: BLOOD RIGHT FOREARM  Result Value Ref Range Status   Specimen Description   Final    BLOOD RIGHT FOREARM Performed at [facility], [address], [place], [place] [address]    Special Requests   Final    BOTTLES DRAWN AEROBIC AND ANAEROBIC Blood Culture adequate volume Performed at [facility], [address] [place], [place] [address]     Culture   Final    NO GROWTH 4 DAYS Performed at [facility], [address], [place], [place] [address]    Report Status PENDING  Incomplete  Blood culture (routine x 2)     Status: None (Preliminary result)   Collection Time: 02/10/24  7:04 PM   Specimen: BLOOD  Result Value Ref Range Status   Specimen Description   Final  BLOOD LEFT ANTECUBITAL Performed at [facility], [address], [place], [place] [address]    Special Requests   Final    BOTTLES DRAWN AEROBIC AND ANAEROBIC Blood Culture adequate volume Performed at [facility], [address], [place], [place] [address]    Culture   Final    NO GROWTH 4 DAYS Performed at [facility], [address], [place], [place] [address]    Report Status PENDING  Incomplete  Urine Culture (for pregnant, neutropenic or urologic patients or patients with an indwelling urinary catheter)     Status: Abnormal   Collection Time: 02/10/24 11:16 PM   Specimen: Urine, Clean Catch  Result Value Ref Range Status   Specimen Description   Final    URINE, CLEAN CATCH Performed at [facility], [address] [place], [place] [address]    Special Requests   Final    NONE Performed at [facility], [address] [place], [place] [address]    Culture >=100,000 COLONIES/mL ESCHERICHIA COLI (A)  Final   Report Status 02/13/2024 FINAL  Final   Organism ID, Bacteria ESCHERICHIA COLI (A)  Final      Susceptibility   Escherichia coli - MIC*    AMPICILLIN  >=32 RESISTANT Resistant     CEFAZOLIN <=4 SENSITIVE Sensitive     CEFEPIME  <=0.12 SENSITIVE Sensitive     CEFTRIAXONE  <=0.25 SENSITIVE Sensitive     CIPROFLOXACIN  <=0.25 SENSITIVE Sensitive     GENTAMICIN <=1 SENSITIVE Sensitive     IMIPENEM <=0.25 SENSITIVE Sensitive     NITROFURANTOIN <=16 SENSITIVE Sensitive     TRIMETH/SULFA <=20 SENSITIVE Sensitive     AMPICILLIN /SULBACTAM 16 INTERMEDIATE  Intermediate     PIP/TAZO <=4 SENSITIVE Sensitive ug/mL    * >=100,000 COLONIES/mL ESCHERICHIA COLI  MRSA Next Gen by PCR, Nasal     Status: None   Collection Time: 02/11/24 12:29 AM   Specimen: Nasal Mucosa; Nasal Swab  Result Value Ref Range Status   MRSA by PCR Next Gen NOT DETECTED NOT DETECTED Final    Comment: (NOTE) The GeneXpert MRSA Assay (FDA approved for NASAL specimens only), is one component of a comprehensive MRSA colonization surveillance program. It is not intended to diagnose MRSA infection nor to guide or monitor treatment for MRSA infections. Test performance is not FDA approved in patients less than 67 years old. Performed at [facility], [address], [place], [place] [address]      Labs: BNP (last 3 results) Recent Labs    02/10/24 1717  BNP 153.7*   Basic Metabolic Panel: Recent Labs  Lab 02/10/24 1649 02/10/24 2245 02/11/24 0732 02/13/24 0544  NA 141  --  142 139  K 3.1*  --  3.5 3.5  CL 105  --  106 103  CO2 26  --  27 28  GLUCOSE 86  --  83 101*  BUN <5*  --  <5* 6*  CREATININE 0.72  --  0.63 0.63  CALCIUM  9.2  --  8.6* 8.4*  MG  --  2.0  --   --    Liver Function Tests: Recent Labs  Lab 02/10/24 1649 02/13/24 0544  AST 13* 15  ALT 10 12  ALKPHOS 146* 126  BILITOT 0.9 0.6  PROT 7.2 6.0*  ALBUMIN  3.6 2.9*   Recent Labs  Lab 02/10/24 1649  LIPASE 20   No results for input(s): AMMONIA in the last 168 hours.  CBC: Recent Labs  Lab 02/10/24 1649 02/11/24 0732 02/13/24 0544  WBC 9.0 8.8 7.3  HGB 14.1 13.3 12.9  HCT 45.0 44.0 43.0  MCV 95.7 99.3 100.2*  PLT 323 292 248   Cardiac Enzymes: No results for input(s): CKTOTAL, CKMB, CKMBINDEX, TROPONINI in the last 168 hours. BNP: Invalid input(s): POCBNP CBG: No results for input(s): GLUCAP in the last 168 hours. D-Dimer No results for input(s): DDIMER in the last 72 hours. Hgb A1c No results for input(s): HGBA1C in the last 72  hours. Lipid Profile No results for input(s): CHOL, HDL, LDLCALC, TRIG, CHOLHDL, LDLDIRECT in the last 72 hours. Thyroid  function studies No results for input(s): TSH, T4TOTAL, T3FREE, THYROIDAB in the last 72 hours.  Invalid input(s): FREET3 Anemia work up No results for input(s): VITAMINB12, FOLATE, FERRITIN, TIBC, IRON, RETICCTPCT in the last 72 hours. Urinalysis    Component Value Date/Time   COLORURINE YELLOW 02/10/2024 1747   APPEARANCEUR HAZY (A) 02/10/2024 1747   LABSPEC 1.008 02/10/2024 1747   PHURINE 6.0 02/10/2024 1747   GLUCOSEU NEGATIVE 02/10/2024 1747   HGBUR SMALL (A) 02/10/2024 1747   BILIRUBINUR NEGATIVE 02/10/2024 1747   KETONESUR NEGATIVE 02/10/2024 1747   PROTEINUR NEGATIVE 02/10/2024 1747   UROBILINOGEN 0.2 04/14/2011 1651   NITRITE POSITIVE (A) 02/10/2024 1747   LEUKOCYTESUR MODERATE (A) 02/10/2024 1747   Sepsis Labs Recent Labs  Lab 02/10/24 1649 02/11/24 0732 02/13/24 0544  WBC 9.0 8.8 7.3   Microbiology Recent Results (from the past 240 hours)  Resp panel by RT-PCR (RSV, Flu A&B, Covid) Anterior Nasal Swab     Status: None   Collection Time: 02/10/24  5:46 PM   Specimen: Anterior Nasal Swab  Result Value Ref Range Status   SARS Coronavirus 2 by RT PCR NEGATIVE NEGATIVE Final    Comment: (NOTE) SARS-CoV-2 target nucleic acids are NOT DETECTED.  The SARS-CoV-2 RNA is generally detectable in upper respiratory specimens during the acute phase of infection. The lowest concentration of SARS-CoV-2 viral copies this assay can detect is 138 copies/mL. A negative result does not preclude SARS-Cov-2 infection and should not be used as the sole basis for treatment or other patient management decisions. A negative result may occur with  improper specimen collection/handling, submission of specimen other than nasopharyngeal swab, presence of viral mutation(s) within the areas targeted by this assay, and inadequate number  of viral copies(<138 copies/mL). A negative result must be combined with clinical observations, patient history, and epidemiological information. The expected result is Negative.  Fact Sheet for Patients:  BloggerCourse.com  Fact Sheet for Healthcare Providers:  SeriousBroker.it  This test is no t yet approved or cleared by the [place]  FDA and  has been authorized for detection and/or diagnosis of SARS-CoV-2 by FDA under an Emergency Use Authorization (EUA). This EUA will remain  in effect (meaning this test can be used) for the duration of the COVID-19 declaration under Section 564(b)(1) of the Act, 21 U.S.C.section 360bbb-3(b)(1), unless the authorization is terminated  or revoked sooner.       Influenza A by PCR NEGATIVE NEGATIVE Final   Influenza B by PCR NEGATIVE NEGATIVE Final    Comment: (NOTE) The Xpert Xpress SARS-CoV-2/FLU/RSV plus assay is intended as an aid in the diagnosis of influenza from Nasopharyngeal swab specimens and should not be used as a sole basis for treatment. Nasal washings and aspirates are unacceptable for Xpert Xpress SARS-CoV-2/FLU/RSV testing.  Fact Sheet for Patients: BloggerCourse.com  Fact Sheet for Healthcare Providers: SeriousBroker.it  This test is not yet approved or cleared by the [place]  FDA and has been authorized for detection and/or diagnosis of SARS-CoV-2 by FDA under an Emergency Use Authorization (EUA). This EUA will remain in effect (meaning this test can be used) for the duration of the COVID-19 declaration under Section 564(b)(1) of the Act, 21 U.S.C. section 360bbb-3(b)(1), unless the authorization is terminated or revoked.     Resp Syncytial Virus by PCR NEGATIVE NEGATIVE Final    Comment: (NOTE) Fact Sheet for Patients: BloggerCourse.com  Fact Sheet for Healthcare  Providers: SeriousBroker.it  This test is not yet approved or cleared by the [place]  FDA and has been authorized for detection and/or diagnosis of SARS-CoV-2 by FDA under an Emergency Use Authorization (EUA). This EUA will remain in effect (meaning this test can be used) for the duration of the COVID-19 declaration under Section 564(b)(1) of the Act, 21 U.S.C. section 360bbb-3(b)(1), unless the authorization is terminated or revoked.  Performed at [facility], [address], [place], [place] [address]   Blood culture (routine x 2)     Status: None (Preliminary result)   Collection Time: 02/10/24  6:45 PM   Specimen: BLOOD RIGHT FOREARM  Result Value Ref Range Status   Specimen Description   Final    BLOOD RIGHT FOREARM Performed at [facility], [address], [place], [place] [address]    Special Requests   Final    BOTTLES DRAWN AEROBIC AND ANAEROBIC Blood Culture adequate volume Performed at [facility], [address], [place], [place] [address]    Culture   Final    NO GROWTH 4 DAYS Performed at [facility], [address], [place], [place] [address]    Report Status PENDING  Incomplete  Blood culture (routine x 2)     Status: None (Preliminary result)   Collection Time: 02/10/24  7:04 PM   Specimen: BLOOD  Result Value Ref Range Status   Specimen Description   Final    BLOOD LEFT ANTECUBITAL Performed at [facility], [address], [place], [place] [address]    Special Requests   Final    BOTTLES DRAWN AEROBIC AND ANAEROBIC Blood Culture adequate volume Performed at [facility], [address], [place], [place] [address]    Culture   Final    NO GROWTH 4 DAYS Performed at [facility], [address] [place], [place] [address]    Report Status PENDING  Incomplete  Urine Culture (for pregnant, neutropenic or urologic patients or  patients with an indwelling urinary catheter)     Status: Abnormal   Collection Time: 02/10/24 11:16 PM   Specimen: Urine, Clean Catch  Result Value Ref Range Status   Specimen Description   Final    URINE, CLEAN CATCH Performed at [facility], [address], [place], [place] [address]    Special Requests   Final    NONE Performed at [facility], [address] [place], [place] [address]    Culture >=100,000 COLONIES/mL ESCHERICHIA COLI (A)  Final   Report Status 02/13/2024 FINAL  Final   Organism ID, Bacteria ESCHERICHIA COLI (A)  Final      Susceptibility   Escherichia coli - MIC*    AMPICILLIN  >=32 RESISTANT Resistant     CEFAZOLIN <=4 SENSITIVE Sensitive     CEFEPIME  <=0.12 SENSITIVE Sensitive     CEFTRIAXONE  <=0.25 SENSITIVE Sensitive  CIPROFLOXACIN  <=0.25 SENSITIVE Sensitive     GENTAMICIN <=1 SENSITIVE Sensitive     IMIPENEM <=0.25 SENSITIVE Sensitive     NITROFURANTOIN <=16 SENSITIVE Sensitive     TRIMETH/SULFA <=20 SENSITIVE Sensitive     AMPICILLIN /SULBACTAM 16 INTERMEDIATE Intermediate     PIP/TAZO <=4 SENSITIVE Sensitive ug/mL    * >=100,000 COLONIES/mL ESCHERICHIA COLI  MRSA Next Gen by PCR, Nasal     Status: None   Collection Time: 02/11/24 12:29 AM   Specimen: Nasal Mucosa; Nasal Swab  Result Value Ref Range Status   MRSA by PCR Next Gen NOT DETECTED NOT DETECTED Final    Comment: (NOTE) The GeneXpert MRSA Assay (FDA approved for NASAL specimens only), is one component of a comprehensive MRSA colonization surveillance program. It is not intended to diagnose MRSA infection nor to guide or monitor treatment for MRSA infections. Test performance is not FDA approved in patients less than 22 years old. Performed at [facility], [address], [place], [place] [address]      Time coordinating discharge: 39 min  SIGNED:   Almarie KANDICE Hoots, MD  [facility] 02/14/2024, 3:23 PM

## 2024-02-15 LAB — CULTURE, BLOOD (ROUTINE X 2)
Culture: NO GROWTH
Culture: NO GROWTH
Special Requests: ADEQUATE
Special Requests: ADEQUATE
